# Patient Record
Sex: Male | Born: 1935 | Race: White | Hispanic: No | Smoking: Never smoker
Health system: Southern US, Community
[De-identification: ages and names within clinical notes are randomized; demographics above are authoritative.]

## PROBLEM LIST (undated history)

## (undated) DIAGNOSIS — E785 Hyperlipidemia, unspecified: Secondary | ICD-10-CM

## (undated) DIAGNOSIS — I639 Cerebral infarction, unspecified: Secondary | ICD-10-CM

## (undated) DIAGNOSIS — N189 Chronic kidney disease, unspecified: Secondary | ICD-10-CM

## (undated) DIAGNOSIS — N400 Enlarged prostate without lower urinary tract symptoms: Secondary | ICD-10-CM

## (undated) DIAGNOSIS — C20 Malignant neoplasm of rectum: Secondary | ICD-10-CM

## (undated) DIAGNOSIS — R569 Unspecified convulsions: Secondary | ICD-10-CM

## (undated) DIAGNOSIS — I77810 Thoracic aortic ectasia: Secondary | ICD-10-CM

## (undated) DIAGNOSIS — I2510 Atherosclerotic heart disease of native coronary artery without angina pectoris: Secondary | ICD-10-CM

## (undated) DIAGNOSIS — C189 Malignant neoplasm of colon, unspecified: Secondary | ICD-10-CM

## (undated) DIAGNOSIS — I10 Essential (primary) hypertension: Secondary | ICD-10-CM

## (undated) DIAGNOSIS — D649 Anemia, unspecified: Secondary | ICD-10-CM

## (undated) DIAGNOSIS — E119 Type 2 diabetes mellitus without complications: Secondary | ICD-10-CM

## (undated) DIAGNOSIS — D72819 Decreased white blood cell count, unspecified: Secondary | ICD-10-CM

## (undated) HISTORY — DX: Decreased white blood cell count, unspecified: D72.819

## (undated) HISTORY — PX: OTHER SURGICAL HISTORY: SHX169

## (undated) HISTORY — DX: Malignant neoplasm of colon, unspecified: C18.9

## (undated) HISTORY — DX: Anemia, unspecified: D64.9

## (undated) HISTORY — PX: TONSILLECTOMY: SUR1361

## (undated) HISTORY — DX: Hyperlipidemia, unspecified: E78.5

## (undated) HISTORY — PX: COLON SURGERY: SHX602

## (undated) HISTORY — DX: Benign prostatic hyperplasia without lower urinary tract symptoms: N40.0

## (undated) HISTORY — PX: VASECTOMY: SHX75

## (undated) HISTORY — PX: HIP ARTHROPLASTY: SHX981

## (undated) HISTORY — DX: Malignant neoplasm of rectum: C20

## (undated) HISTORY — PX: CATARACT EXTRACTION, BILATERAL: SHX1313

## (undated) HISTORY — DX: Unspecified convulsions: R56.9

## (undated) HISTORY — DX: Atherosclerotic heart disease of native coronary artery without angina pectoris: I25.10

## (undated) HISTORY — DX: Cerebral infarction, unspecified: I63.9

## (undated) HISTORY — PX: JOINT REPLACEMENT: SHX530

## (undated) HISTORY — DX: Type 2 diabetes mellitus without complications: E11.9

## (undated) HISTORY — DX: Essential (primary) hypertension: I10

## (undated) HISTORY — DX: Chronic kidney disease, unspecified: N18.9

## (undated) HISTORY — DX: Thoracic aortic ectasia: I77.810

---

## 1998-10-21 ENCOUNTER — Ambulatory Visit (HOSPITAL_COMMUNITY): Admission: RE | Admit: 1998-10-21 | Discharge: 1998-10-21 | Payer: Self-pay | Admitting: Urology

## 1999-02-03 ENCOUNTER — Encounter: Admission: RE | Admit: 1999-02-03 | Discharge: 1999-05-04 | Payer: Self-pay | Admitting: Emergency Medicine

## 2003-09-12 ENCOUNTER — Encounter: Payer: Self-pay | Admitting: Neurology

## 2003-09-12 ENCOUNTER — Inpatient Hospital Stay (HOSPITAL_COMMUNITY): Admission: EM | Admit: 2003-09-12 | Discharge: 2003-09-13 | Payer: Self-pay | Admitting: *Deleted

## 2003-09-12 ENCOUNTER — Encounter: Payer: Self-pay | Admitting: Emergency Medicine

## 2003-09-13 ENCOUNTER — Encounter: Payer: Self-pay | Admitting: Internal Medicine

## 2005-02-11 ENCOUNTER — Ambulatory Visit: Payer: Self-pay

## 2005-04-30 ENCOUNTER — Ambulatory Visit: Payer: Self-pay | Admitting: Cardiology

## 2005-09-06 ENCOUNTER — Ambulatory Visit: Payer: Self-pay | Admitting: Cardiology

## 2005-09-07 ENCOUNTER — Ambulatory Visit: Payer: Self-pay | Admitting: Cardiology

## 2005-11-08 ENCOUNTER — Ambulatory Visit: Payer: Self-pay | Admitting: Cardiology

## 2006-02-21 ENCOUNTER — Ambulatory Visit: Payer: Self-pay | Admitting: Cardiology

## 2006-02-22 ENCOUNTER — Ambulatory Visit: Payer: Self-pay | Admitting: Cardiology

## 2006-06-07 ENCOUNTER — Encounter: Payer: Self-pay | Admitting: Cardiology

## 2006-08-16 ENCOUNTER — Ambulatory Visit: Payer: Self-pay | Admitting: Cardiology

## 2007-02-14 ENCOUNTER — Ambulatory Visit: Payer: Self-pay | Admitting: Cardiology

## 2007-05-18 ENCOUNTER — Ambulatory Visit: Payer: Self-pay | Admitting: Cardiology

## 2007-05-29 ENCOUNTER — Inpatient Hospital Stay (HOSPITAL_COMMUNITY): Admission: RE | Admit: 2007-05-29 | Discharge: 2007-06-02 | Payer: Self-pay | Admitting: Orthopedic Surgery

## 2007-11-15 ENCOUNTER — Ambulatory Visit: Payer: Self-pay | Admitting: Cardiology

## 2008-05-16 ENCOUNTER — Ambulatory Visit: Payer: Self-pay | Admitting: Cardiology

## 2008-11-23 ENCOUNTER — Emergency Department (HOSPITAL_COMMUNITY): Admission: EM | Admit: 2008-11-23 | Discharge: 2008-11-23 | Payer: Self-pay | Admitting: Emergency Medicine

## 2008-12-17 ENCOUNTER — Ambulatory Visit: Payer: Self-pay | Admitting: Cardiology

## 2009-02-19 ENCOUNTER — Ambulatory Visit: Payer: Self-pay | Admitting: Cardiology

## 2009-02-20 ENCOUNTER — Encounter: Payer: Self-pay | Admitting: Cardiology

## 2009-02-20 ENCOUNTER — Ambulatory Visit: Payer: Self-pay | Admitting: Family Medicine

## 2009-02-20 ENCOUNTER — Ambulatory Visit: Payer: Self-pay | Admitting: Vascular Surgery

## 2009-02-20 ENCOUNTER — Encounter (INDEPENDENT_AMBULATORY_CARE_PROVIDER_SITE_OTHER): Payer: Self-pay | Admitting: Family Medicine

## 2009-02-20 ENCOUNTER — Inpatient Hospital Stay (HOSPITAL_COMMUNITY): Admission: EM | Admit: 2009-02-20 | Discharge: 2009-02-20 | Payer: Self-pay | Admitting: Emergency Medicine

## 2009-02-20 ENCOUNTER — Encounter: Payer: Self-pay | Admitting: Family Medicine

## 2009-03-25 ENCOUNTER — Encounter: Payer: Self-pay | Admitting: Cardiology

## 2009-03-28 ENCOUNTER — Ambulatory Visit: Payer: Self-pay | Admitting: Oncology

## 2009-04-01 LAB — COMPREHENSIVE METABOLIC PANEL
CO2: 33 mEq/L — ABNORMAL HIGH (ref 19–32)
Creatinine, Ser: 1 mg/dL (ref 0.40–1.50)
Glucose, Bld: 172 mg/dL — ABNORMAL HIGH (ref 70–99)
Sodium: 137 mEq/L (ref 135–145)
Total Bilirubin: 0.7 mg/dL (ref 0.3–1.2)
Total Protein: 7.1 g/dL (ref 6.0–8.3)

## 2009-04-01 LAB — CBC & DIFF AND RETIC
BASO%: 1.6 % (ref 0.0–2.0)
Basophils Absolute: 0 10*3/uL (ref 0.0–0.1)
EOS%: 4.1 % (ref 0.0–7.0)
Eosinophils Absolute: 0.1 10*3/uL (ref 0.0–0.5)
HCT: 36.4 % — ABNORMAL LOW (ref 38.4–49.9)
HGB: 12.7 g/dL — ABNORMAL LOW (ref 13.0–17.1)
LYMPH%: 59.6 % — ABNORMAL HIGH (ref 14.0–49.0)
MONO#: 0.5 10*3/uL (ref 0.1–0.9)
MONO%: 26.4 % — ABNORMAL HIGH (ref 0.0–14.0)
NEUT#: 0.2 10*3/uL — CL (ref 1.5–6.5)
NEUT%: 8.3 % — ABNORMAL LOW (ref 39.0–75.0)
RDW: 13.3 % (ref 11.0–14.6)
WBC: 1.9 10*3/uL — ABNORMAL LOW (ref 4.0–10.3)
lymph#: 1.2 10*3/uL (ref 0.9–3.3)
nRBC: 0 % (ref 0–0)

## 2009-04-01 LAB — MORPHOLOGY: PLT EST: ADEQUATE

## 2009-04-01 LAB — LACTATE DEHYDROGENASE: LDH: 127 U/L (ref 94–250)

## 2009-04-01 LAB — CHCC SMEAR

## 2009-04-03 ENCOUNTER — Encounter (HOSPITAL_COMMUNITY): Payer: Self-pay | Admitting: Oncology

## 2009-04-03 ENCOUNTER — Ambulatory Visit (HOSPITAL_COMMUNITY): Admission: RE | Admit: 2009-04-03 | Discharge: 2009-04-03 | Payer: Self-pay | Admitting: Oncology

## 2009-04-03 ENCOUNTER — Ambulatory Visit: Payer: Self-pay | Admitting: Oncology

## 2009-04-09 LAB — CBC WITH DIFFERENTIAL/PLATELET
Eosinophils Absolute: 0.1 10*3/uL (ref 0.0–0.5)
HCT: 35.9 % — ABNORMAL LOW (ref 38.4–49.9)
LYMPH%: 61.4 % — ABNORMAL HIGH (ref 14.0–49.0)
MONO#: 0.6 10*3/uL (ref 0.1–0.9)
NEUT#: 0.2 10*3/uL — CL (ref 1.5–6.5)
NEUT%: 8.9 % — ABNORMAL LOW (ref 39.0–75.0)
Platelets: 196 10*3/uL (ref 140–400)
WBC: 2.4 10*3/uL — ABNORMAL LOW (ref 4.0–10.3)

## 2009-04-10 ENCOUNTER — Other Ambulatory Visit: Payer: Self-pay | Admitting: Emergency Medicine

## 2009-04-11 ENCOUNTER — Ambulatory Visit: Payer: Self-pay | Admitting: Family Medicine

## 2009-04-11 ENCOUNTER — Inpatient Hospital Stay (HOSPITAL_COMMUNITY): Admission: EM | Admit: 2009-04-11 | Discharge: 2009-04-11 | Payer: Self-pay | Admitting: Family Medicine

## 2009-04-11 ENCOUNTER — Encounter: Payer: Self-pay | Admitting: Cardiology

## 2009-04-25 LAB — RHEUMATOID FACTOR: Rhuematoid fact SerPl-aCnc: <20 IU/mL (ref 0–20)

## 2009-04-25 LAB — CBC WITH DIFFERENTIAL/PLATELET
Eosinophils Absolute: 0.2 10*3/uL (ref 0.0–0.5)
HGB: 12.9 g/dL — ABNORMAL LOW (ref 13.0–17.1)
MCV: 83.6 fL (ref 79.3–98.0)
MONO#: 0.4 10*3/uL (ref 0.1–0.9)
MONO%: 20.2 % — ABNORMAL HIGH (ref 0.0–14.0)
NEUT#: 0.2 10*3/uL — CL (ref 1.5–6.5)
RBC: 4.45 10*6/uL (ref 4.20–5.82)
RDW: 13.3 % (ref 11.0–14.6)
WBC: 2.1 10*3/uL — ABNORMAL LOW (ref 4.0–10.3)
lymph#: 1.3 10*3/uL (ref 0.9–3.3)
nRBC: 0 % (ref 0–0)

## 2009-04-29 LAB — ANCA SCREEN W REFLEX TITER: Anti-Neutrophil Ab: POSITIVE — AB

## 2009-04-29 LAB — ANTI-NEUTROPHILIC CYTOPLASMIC ANTIBODY PANEL: ANCA Screen: NEGATIVE

## 2009-05-02 LAB — CBC WITH DIFFERENTIAL/PLATELET
Basophils Absolute: 0.1 10*3/uL (ref 0.0–0.1)
Eosinophils Absolute: 0.2 10*3/uL (ref 0.0–0.5)
HCT: 35.7 % — ABNORMAL LOW (ref 38.4–49.9)
LYMPH%: 65.3 % — ABNORMAL HIGH (ref 14.0–49.0)
MCV: 83.4 fL (ref 79.3–98.0)
MONO%: 24 % — ABNORMAL HIGH (ref 0.0–14.0)
NEUT#: 0.1 10*3/uL — CL (ref 1.5–6.5)
NEUT%: 2 % — ABNORMAL LOW (ref 39.0–75.0)
Platelets: 181 10*3/uL (ref 140–400)
RBC: 4.28 10*6/uL (ref 4.20–5.82)

## 2009-05-21 ENCOUNTER — Ambulatory Visit: Payer: Self-pay | Admitting: Oncology

## 2009-05-23 LAB — CBC WITH DIFFERENTIAL/PLATELET
Eosinophils Absolute: 0.1 10*3/uL (ref 0.0–0.5)
HCT: 35.6 % — ABNORMAL LOW (ref 38.4–49.9)
LYMPH%: 65.4 % — ABNORMAL HIGH (ref 14.0–49.0)
MCV: 82.2 fL (ref 79.3–98.0)
MONO#: 0.6 10*3/uL (ref 0.1–0.9)
MONO%: 26.1 % — ABNORMAL HIGH (ref 0.0–14.0)
NEUT#: 0.1 10*3/uL — CL (ref 1.5–6.5)
NEUT%: 2.5 % — ABNORMAL LOW (ref 39.0–75.0)
Platelets: 196 10*3/uL (ref 140–400)
WBC: 2.3 10*3/uL — ABNORMAL LOW (ref 4.0–10.3)

## 2009-06-06 LAB — CBC WITH DIFFERENTIAL/PLATELET
BASO%: 1.7 % (ref 0.0–2.0)
Basophils Absolute: 0 10e3/uL (ref 0.0–0.1)
EOS%: 4.3 % (ref 0.0–7.0)
Eosinophils Absolute: 0.1 10e3/uL (ref 0.0–0.5)
HCT: 37.5 % — ABNORMAL LOW (ref 38.4–49.9)
HGB: 13 g/dL (ref 13.0–17.1)
LYMPH%: 59.8 % — ABNORMAL HIGH (ref 14.0–49.0)
MCH: 28.8 pg (ref 27.2–33.4)
MCHC: 34.7 g/dL (ref 32.0–36.0)
MCV: 83.1 fL (ref 79.3–98.0)
MONO#: 0.7 10e3/uL (ref 0.1–0.9)
MONO%: 31.2 % — ABNORMAL HIGH (ref 0.0–14.0)
NEUT#: 0.1 10e3/uL — CL (ref 1.5–6.5)
NEUT%: 3 % — ABNORMAL LOW (ref 39.0–75.0)
Platelets: 167 10e3/uL (ref 140–400)
RBC: 4.51 10e6/uL (ref 4.20–5.82)
RDW: 13.2 % (ref 11.0–14.6)
WBC: 2.3 10e3/uL — ABNORMAL LOW (ref 4.0–10.3)
lymph#: 1.4 10e3/uL (ref 0.9–3.3)

## 2009-06-06 LAB — COMPREHENSIVE METABOLIC PANEL
AST: 14 U/L (ref 0–37)
BUN: 16 mg/dL (ref 6–23)
Calcium: 9.3 mg/dL (ref 8.4–10.5)
Chloride: 96 mEq/L (ref 96–112)
Creatinine, Ser: 0.98 mg/dL (ref 0.40–1.50)
Total Bilirubin: 0.4 mg/dL (ref 0.3–1.2)

## 2009-06-06 LAB — LACTATE DEHYDROGENASE: LDH: 112 U/L (ref 94–250)

## 2009-06-20 ENCOUNTER — Ambulatory Visit: Payer: Self-pay | Admitting: Oncology

## 2009-06-20 LAB — CBC WITH DIFFERENTIAL/PLATELET
BASO%: 2.2 % — ABNORMAL HIGH (ref 0.0–2.0)
Basophils Absolute: 0 10*3/uL (ref 0.0–0.1)
EOS%: 4.5 % (ref 0.0–7.0)
MCH: 28.8 pg (ref 27.2–33.4)
MCHC: 34.9 g/dL (ref 32.0–36.0)
MCV: 82.5 fL (ref 79.3–98.0)
MONO%: 31.3 % — ABNORMAL HIGH (ref 0.0–14.0)
RDW: 13 % (ref 11.0–14.6)
lymph#: 0.8 10*3/uL — ABNORMAL LOW (ref 0.9–3.3)

## 2009-06-20 LAB — COMPREHENSIVE METABOLIC PANEL
AST: 16 U/L (ref 0–37)
Albumin: 4.1 g/dL (ref 3.5–5.2)
Alkaline Phosphatase: 66 U/L (ref 39–117)
BUN: 18 mg/dL (ref 6–23)
Glucose, Bld: 154 mg/dL — ABNORMAL HIGH (ref 70–99)
Potassium: 4.4 mEq/L (ref 3.5–5.3)
Sodium: 136 mEq/L (ref 135–145)
Total Bilirubin: 0.4 mg/dL (ref 0.3–1.2)
Total Protein: 6.9 g/dL (ref 6.0–8.3)

## 2009-07-18 ENCOUNTER — Ambulatory Visit: Payer: Self-pay | Admitting: Cardiology

## 2009-07-18 LAB — CBC WITH DIFFERENTIAL/PLATELET
Basophils Absolute: 0 10*3/uL (ref 0.0–0.1)
Eosinophils Absolute: 0.1 10*3/uL (ref 0.0–0.5)
HGB: 12.4 g/dL — ABNORMAL LOW (ref 13.0–17.1)
LYMPH%: 56.1 % — ABNORMAL HIGH (ref 14.0–49.0)
MCV: 82.1 fL (ref 79.3–98.0)
MONO#: 0.6 10*3/uL (ref 0.1–0.9)
MONO%: 32.9 % — ABNORMAL HIGH (ref 0.0–14.0)
NEUT#: 0.1 10*3/uL — CL (ref 1.5–6.5)
Platelets: 198 10*3/uL (ref 140–400)
WBC: 1.7 10*3/uL — ABNORMAL LOW (ref 4.0–10.3)
nRBC: 0 % (ref 0–0)

## 2009-08-20 ENCOUNTER — Ambulatory Visit: Payer: Self-pay | Admitting: Oncology

## 2009-08-22 LAB — CBC WITH DIFFERENTIAL/PLATELET
BASO%: 1.6 % (ref 0.0–2.0)
Basophils Absolute: 0 10*3/uL (ref 0.0–0.1)
EOS%: 5.1 % (ref 0.0–7.0)
Eosinophils Absolute: 0.1 10*3/uL (ref 0.0–0.5)
HCT: 33.2 % — ABNORMAL LOW (ref 38.4–49.9)
HGB: 11.7 g/dL — ABNORMAL LOW (ref 13.0–17.1)
LYMPH%: 60.3 % — ABNORMAL HIGH (ref 14.0–49.0)
MCH: 29.2 pg (ref 27.2–33.4)
MCHC: 35.2 g/dL (ref 32.0–36.0)
MCV: 82.9 fL (ref 79.3–98.0)
MONO#: 0.6 10*3/uL (ref 0.1–0.9)
MONO%: 31.7 % — ABNORMAL HIGH (ref 0.0–14.0)
NEUT#: 0 10*3/uL — CL (ref 1.5–6.5)
NEUT%: 1.3 % — ABNORMAL LOW (ref 39.0–75.0)
Platelets: 236 10*3/uL (ref 140–400)
RBC: 4 10*6/uL — ABNORMAL LOW (ref 4.20–5.82)
RDW: 13.5 % (ref 11.0–14.6)
WBC: 1.9 10*3/uL — ABNORMAL LOW (ref 4.0–10.3)
lymph#: 1.1 10*3/uL (ref 0.9–3.3)

## 2009-09-17 ENCOUNTER — Ambulatory Visit: Payer: Self-pay | Admitting: Oncology

## 2009-09-19 LAB — CBC WITH DIFFERENTIAL/PLATELET
BASO%: 2.8 % — ABNORMAL HIGH (ref 0.0–2.0)
Basophils Absolute: 0 10*3/uL (ref 0.0–0.1)
EOS%: 4.7 % (ref 0.0–7.0)
HGB: 11.7 g/dL — ABNORMAL LOW (ref 13.0–17.1)
MCH: 28.9 pg (ref 27.2–33.4)
MCHC: 34.7 g/dL (ref 32.0–36.0)
RBC: 4.04 10*6/uL — ABNORMAL LOW (ref 4.20–5.82)
RDW: 13.6 % (ref 11.0–14.6)
lymph#: 0.7 10*3/uL — ABNORMAL LOW (ref 0.9–3.3)

## 2009-09-20 DIAGNOSIS — E785 Hyperlipidemia, unspecified: Secondary | ICD-10-CM

## 2009-09-20 DIAGNOSIS — I10 Essential (primary) hypertension: Secondary | ICD-10-CM

## 2009-09-20 DIAGNOSIS — I2510 Atherosclerotic heart disease of native coronary artery without angina pectoris: Secondary | ICD-10-CM | POA: Insufficient documentation

## 2009-10-24 ENCOUNTER — Ambulatory Visit: Payer: Self-pay | Admitting: Oncology

## 2009-10-28 LAB — CBC WITH DIFFERENTIAL/PLATELET
Basophils Absolute: 0 10*3/uL (ref 0.0–0.1)
Eosinophils Absolute: 0.1 10*3/uL (ref 0.0–0.5)
HCT: 35.7 % — ABNORMAL LOW (ref 38.4–49.9)
LYMPH%: 47 % (ref 14.0–49.0)
MONO#: 0.5 10*3/uL (ref 0.1–0.9)
NEUT#: 0.1 10*3/uL — CL (ref 1.5–6.5)
NEUT%: 9.7 % — ABNORMAL LOW (ref 39.0–75.0)
Platelets: 252 10*3/uL (ref 140–400)
WBC: 1.2 10*3/uL — ABNORMAL LOW (ref 4.0–10.3)

## 2009-10-28 LAB — SEDIMENTATION RATE: Sed Rate: 1 mm/hr (ref 0–16)

## 2009-10-28 LAB — VITAMIN B12: Vitamin B-12: 883 pg/mL (ref 211–911)

## 2009-10-28 LAB — COMPREHENSIVE METABOLIC PANEL
CO2: 28 mEq/L (ref 19–32)
Creatinine, Ser: 0.83 mg/dL (ref 0.40–1.50)
Glucose, Bld: 222 mg/dL — ABNORMAL HIGH (ref 70–99)
Total Bilirubin: 0.4 mg/dL (ref 0.3–1.2)

## 2009-10-28 LAB — LACTATE DEHYDROGENASE: LDH: 118 U/L (ref 94–250)

## 2009-11-25 ENCOUNTER — Ambulatory Visit: Payer: Self-pay | Admitting: Oncology

## 2009-11-25 LAB — CBC WITH DIFFERENTIAL/PLATELET
Basophils Absolute: 0.1 10*3/uL (ref 0.0–0.1)
Eosinophils Absolute: 0.1 10*3/uL (ref 0.0–0.5)
HGB: 11.7 g/dL — ABNORMAL LOW (ref 13.0–17.1)
MCV: 83.6 fL (ref 79.3–98.0)
MONO%: 26.8 % — ABNORMAL HIGH (ref 0.0–14.0)
NEUT#: 0 10*3/uL — CL (ref 1.5–6.5)
RDW: 13.4 % (ref 11.0–14.6)
lymph#: 1.2 10*3/uL (ref 0.9–3.3)

## 2009-12-19 ENCOUNTER — Ambulatory Visit: Payer: Self-pay | Admitting: Oncology

## 2009-12-23 LAB — CBC WITH DIFFERENTIAL/PLATELET
BASO%: 2.7 % — ABNORMAL HIGH (ref 0.0–2.0)
HCT: 35 % — ABNORMAL LOW (ref 38.4–49.9)
HGB: 11.7 g/dL — ABNORMAL LOW (ref 13.0–17.1)
MCHC: 33.4 g/dL (ref 32.0–36.0)
MONO#: 0.5 10*3/uL (ref 0.1–0.9)
NEUT%: 4.4 % — ABNORMAL LOW (ref 39.0–75.0)
WBC: 1.8 10*3/uL — ABNORMAL LOW (ref 4.0–10.3)
lymph#: 1.1 10*3/uL (ref 0.9–3.3)

## 2010-01-16 ENCOUNTER — Encounter (INDEPENDENT_AMBULATORY_CARE_PROVIDER_SITE_OTHER): Payer: Self-pay | Admitting: *Deleted

## 2010-01-16 ENCOUNTER — Encounter: Payer: Self-pay | Admitting: Cardiology

## 2010-01-16 ENCOUNTER — Inpatient Hospital Stay (HOSPITAL_COMMUNITY): Admission: EM | Admit: 2010-01-16 | Discharge: 2010-01-19 | Payer: Self-pay | Admitting: Family Medicine

## 2010-01-16 ENCOUNTER — Ambulatory Visit: Payer: Self-pay | Admitting: Family Medicine

## 2010-01-16 ENCOUNTER — Encounter: Payer: Self-pay | Admitting: Emergency Medicine

## 2010-01-16 ENCOUNTER — Ambulatory Visit: Payer: Self-pay | Admitting: Diagnostic Radiology

## 2010-01-19 ENCOUNTER — Encounter: Payer: Self-pay | Admitting: Cardiology

## 2010-01-19 ENCOUNTER — Telehealth (INDEPENDENT_AMBULATORY_CARE_PROVIDER_SITE_OTHER): Payer: Self-pay | Admitting: *Deleted

## 2010-01-20 ENCOUNTER — Ambulatory Visit: Payer: Self-pay | Admitting: Oncology

## 2010-01-20 LAB — CBC WITH DIFFERENTIAL/PLATELET
Basophils Absolute: 0 10*3/uL (ref 0.0–0.1)
Eosinophils Absolute: 0.1 10*3/uL (ref 0.0–0.5)
HCT: 33.8 % — ABNORMAL LOW (ref 38.4–49.9)
HGB: 11.2 g/dL — ABNORMAL LOW (ref 13.0–17.1)
MCH: 27.1 pg — ABNORMAL LOW (ref 27.2–33.4)
MONO#: 0.8 10*3/uL (ref 0.1–0.9)
NEUT#: 0 10*3/uL — CL (ref 1.5–6.5)
NEUT%: 0.5 % — ABNORMAL LOW (ref 39.0–75.0)
RDW: 13.1 % (ref 11.0–14.6)
lymph#: 1 10*3/uL (ref 0.9–3.3)

## 2010-02-17 LAB — COMPREHENSIVE METABOLIC PANEL
AST: 12 U/L (ref 0–37)
BUN: 15 mg/dL (ref 6–23)
Calcium: 9.4 mg/dL (ref 8.4–10.5)
Chloride: 96 mEq/L (ref 96–112)
Creatinine, Ser: 0.92 mg/dL (ref 0.40–1.50)
Glucose, Bld: 188 mg/dL — ABNORMAL HIGH (ref 70–99)

## 2010-02-17 LAB — VITAMIN B12: Vitamin B-12: 647 pg/mL (ref 211–911)

## 2010-02-17 LAB — CBC WITH DIFFERENTIAL/PLATELET
Basophils Absolute: 0 10*3/uL (ref 0.0–0.1)
EOS%: 4.4 % (ref 0.0–7.0)
HCT: 33.7 % — ABNORMAL LOW (ref 38.4–49.9)
HGB: 11.6 g/dL — ABNORMAL LOW (ref 13.0–17.1)
MCH: 28.3 pg (ref 27.2–33.4)
MCV: 81.8 fL (ref 79.3–98.0)
MONO%: 32.6 % — ABNORMAL HIGH (ref 0.0–14.0)
NEUT%: 8.3 % — ABNORMAL LOW (ref 39.0–75.0)
lymph#: 0.6 10*3/uL — ABNORMAL LOW (ref 0.9–3.3)

## 2010-02-18 LAB — IRON AND TIBC
Iron: 18 ug/dL — ABNORMAL LOW (ref 42–165)
UIBC: 285 ug/dL

## 2010-02-18 LAB — FERRITIN: Ferritin: 92 ng/mL (ref 22–322)

## 2010-02-23 ENCOUNTER — Ambulatory Visit: Payer: Self-pay | Admitting: Oncology

## 2010-02-23 LAB — FECAL OCCULT BLOOD, GUAIAC: Occult Blood: NEGATIVE

## 2010-03-16 ENCOUNTER — Ambulatory Visit: Payer: Self-pay | Admitting: Cardiology

## 2010-03-17 LAB — MANUAL DIFFERENTIAL
ANC (CHCC manual diff): 0 10*3/uL — CL (ref 1.5–6.5)
Band Neutrophils: 0 % (ref 0–10)
Basophil: 3 % — ABNORMAL HIGH (ref 0–2)
Blasts: 0 % (ref 0–0)
EOS: 4 % (ref 0–7)
LYMPH: 78 % — ABNORMAL HIGH (ref 14–49)
Metamyelocytes: 0 % (ref 0–0)
PLT EST: ADEQUATE
nRBC: 0 % (ref 0–0)

## 2010-03-17 LAB — CBC WITH DIFFERENTIAL/PLATELET
HCT: 29.6 % — ABNORMAL LOW (ref 38.4–49.9)
MCH: 27 pg — ABNORMAL LOW (ref 27.2–33.4)
MCHC: 33.4 g/dL (ref 32.0–36.0)
MCV: 80.7 fL (ref 79.3–98.0)
RDW: 13.5 % (ref 11.0–14.6)

## 2010-03-26 ENCOUNTER — Ambulatory Visit: Payer: Self-pay | Admitting: Oncology

## 2010-03-30 LAB — CBC WITH DIFFERENTIAL/PLATELET
BASO%: 3.3 % — ABNORMAL HIGH (ref 0.0–2.0)
Eosinophils Absolute: 0.1 10*3/uL (ref 0.0–0.5)
MCHC: 32.9 g/dL (ref 32.0–36.0)
MONO#: 0.6 10*3/uL (ref 0.1–0.9)
NEUT#: 0 10*3/uL — CL (ref 1.5–6.5)
Platelets: 277 10*3/uL (ref 140–400)
RBC: 3.51 10*6/uL — ABNORMAL LOW (ref 4.20–5.82)
RDW: 13.1 % (ref 11.0–14.6)
WBC: 1.8 10*3/uL — ABNORMAL LOW (ref 4.0–10.3)
lymph#: 1.1 10*3/uL (ref 0.9–3.3)
nRBC: 0 % (ref 0–0)

## 2010-03-31 LAB — IRON AND TIBC
%SAT: 5 % — ABNORMAL LOW (ref 20–55)
Iron: 14 ug/dL — ABNORMAL LOW (ref 42–165)
TIBC: 310 ug/dL (ref 215–435)
UIBC: 296 ug/dL

## 2010-04-06 ENCOUNTER — Ambulatory Visit: Payer: Self-pay | Admitting: Oncology

## 2010-04-06 LAB — COMPREHENSIVE METABOLIC PANEL
Albumin: 3.9 g/dL (ref 3.5–5.2)
BUN: 25 mg/dL — ABNORMAL HIGH (ref 6–23)
Calcium: 9.4 mg/dL (ref 8.4–10.5)
Chloride: 94 mEq/L — ABNORMAL LOW (ref 96–112)
Glucose, Bld: 123 mg/dL — ABNORMAL HIGH (ref 70–99)
Potassium: 4.5 mEq/L (ref 3.5–5.3)

## 2010-04-06 LAB — CBC WITH DIFFERENTIAL/PLATELET
BASO%: 2.2 % — ABNORMAL HIGH (ref 0.0–2.0)
HCT: 29.5 % — ABNORMAL LOW (ref 38.4–49.9)
LYMPH%: 57.3 % — ABNORMAL HIGH (ref 14.0–49.0)
MCH: 25.9 pg — ABNORMAL LOW (ref 27.2–33.4)
MCHC: 32.5 g/dL (ref 32.0–36.0)
MCV: 79.5 fL (ref 79.3–98.0)
MONO#: 0.8 10*3/uL (ref 0.1–0.9)
MONO%: 32.3 % — ABNORMAL HIGH (ref 0.0–14.0)
NEUT%: 4.8 % — ABNORMAL LOW (ref 39.0–75.0)
Platelets: 337 10*3/uL (ref 140–400)
RBC: 3.71 10*6/uL — ABNORMAL LOW (ref 4.20–5.82)
WBC: 2.3 10*3/uL — ABNORMAL LOW (ref 4.0–10.3)

## 2010-04-06 LAB — MORPHOLOGY

## 2010-04-08 ENCOUNTER — Ambulatory Visit (HOSPITAL_COMMUNITY): Admission: RE | Admit: 2010-04-08 | Discharge: 2010-04-08 | Payer: Self-pay | Admitting: Oncology

## 2010-04-13 LAB — CBC WITH DIFFERENTIAL/PLATELET
Eosinophils Absolute: 0.1 10*3/uL (ref 0.0–0.5)
HCT: 25.8 % — ABNORMAL LOW (ref 38.4–49.9)
HGB: 8.6 g/dL — ABNORMAL LOW (ref 13.0–17.1)
MCV: 80.3 fL (ref 79.3–98.0)
MONO#: 0.4 10*3/uL (ref 0.1–0.9)
MONO%: 32.7 % — ABNORMAL HIGH (ref 0.0–14.0)
Platelets: 315 10*3/uL (ref 140–400)
RBC: 3.21 10*6/uL — ABNORMAL LOW (ref 4.20–5.82)
RDW: 13.3 % (ref 11.0–14.6)

## 2010-04-15 ENCOUNTER — Ambulatory Visit (HOSPITAL_COMMUNITY): Admission: RE | Admit: 2010-04-15 | Discharge: 2010-04-15 | Payer: Self-pay | Admitting: Oncology

## 2010-04-20 LAB — CBC WITH DIFFERENTIAL/PLATELET
BASO%: 1.5 % (ref 0.0–2.0)
HCT: 25.7 % — ABNORMAL LOW (ref 38.4–49.9)
HGB: 8.6 g/dL — ABNORMAL LOW (ref 13.0–17.1)
MCHC: 33.6 g/dL (ref 32.0–36.0)
MONO#: 0.7 10*3/uL (ref 0.1–0.9)
NEUT%: 2.2 % — ABNORMAL LOW (ref 39.0–75.0)
RDW: 14.1 % (ref 11.0–14.6)
WBC: 1.9 10*3/uL — ABNORMAL LOW (ref 4.0–10.3)
lymph#: 1 10*3/uL (ref 0.9–3.3)

## 2010-04-27 ENCOUNTER — Ambulatory Visit: Payer: Self-pay | Admitting: Oncology

## 2010-04-28 LAB — CBC WITH DIFFERENTIAL/PLATELET
Basophils Absolute: 0 10*3/uL (ref 0.0–0.1)
Eosinophils Absolute: 0.1 10*3/uL (ref 0.0–0.5)
HCT: 26.6 % — ABNORMAL LOW (ref 38.4–49.9)
HGB: 9.2 g/dL — ABNORMAL LOW (ref 13.0–17.1)
MCH: 28.4 pg (ref 27.2–33.4)
MONO#: 0.4 10*3/uL (ref 0.1–0.9)
NEUT#: 0.1 10*3/uL — CL (ref 1.5–6.5)
NEUT%: 9.8 % — ABNORMAL LOW (ref 39.0–75.0)
WBC: 1.3 10*3/uL — ABNORMAL LOW (ref 4.0–10.3)
lymph#: 0.6 10*3/uL — ABNORMAL LOW (ref 0.9–3.3)

## 2010-04-28 LAB — FLOW CYTOMETRY

## 2010-04-29 LAB — TRANSFERRIN RECEPTOR, SOLUABLE: Transferrin Receptor, Soluble: 21.8 nmol/L

## 2010-04-29 LAB — C-REACTIVE PROTEIN: CRP: 2.8 mg/dL — ABNORMAL HIGH (ref <0.6)

## 2010-04-29 LAB — IRON AND TIBC
Iron: 20 ug/dL — ABNORMAL LOW (ref 42–165)
TIBC: 293 ug/dL (ref 215–435)
UIBC: 273 ug/dL

## 2010-05-19 LAB — CBC WITH DIFFERENTIAL/PLATELET
BASO%: 3.3 % — ABNORMAL HIGH (ref 0.0–2.0)
EOS%: 5.3 % (ref 0.0–7.0)
HCT: 25.6 % — ABNORMAL LOW (ref 38.4–49.9)
LYMPH%: 48 % (ref 14.0–49.0)
MCH: 26.3 pg — ABNORMAL LOW (ref 27.2–33.4)
MCHC: 32 g/dL (ref 32.0–36.0)
NEUT%: 8.5 % — ABNORMAL LOW (ref 39.0–75.0)
Platelets: 283 10*3/uL (ref 140–400)

## 2010-05-27 ENCOUNTER — Ambulatory Visit: Payer: Self-pay | Admitting: Oncology

## 2010-05-27 LAB — CBC WITH DIFFERENTIAL/PLATELET
Basophils Absolute: 0 10*3/uL (ref 0.0–0.1)
EOS%: 0.8 % (ref 0.0–7.0)
HGB: 7.6 g/dL — ABNORMAL LOW (ref 13.0–17.1)
LYMPH%: 53.9 % — ABNORMAL HIGH (ref 14.0–49.0)
MCH: 25.7 pg — ABNORMAL LOW (ref 27.2–33.4)
MCV: 81.8 fL (ref 79.3–98.0)
MONO%: 31.3 % — ABNORMAL HIGH (ref 0.0–14.0)
Platelets: 308 10*3/uL (ref 140–400)
RBC: 2.96 10*6/uL — ABNORMAL LOW (ref 4.20–5.82)
RDW: 14.5 % (ref 11.0–14.6)

## 2010-05-28 LAB — HAPTOGLOBIN: Haptoglobin: 222 mg/dL — ABNORMAL HIGH (ref 16–200)

## 2010-05-28 LAB — DIRECT ANTIGLOBULIN TEST (NOT AT ARMC): DAT (Complement): NEGATIVE

## 2010-06-03 LAB — CBC WITH DIFFERENTIAL/PLATELET
BASO%: 2.1 % — ABNORMAL HIGH (ref 0.0–2.0)
EOS%: 0.9 % (ref 0.0–7.0)
HCT: 23.5 % — ABNORMAL LOW (ref 38.4–49.9)
MCH: 26.8 pg — ABNORMAL LOW (ref 27.2–33.4)
MCHC: 33.9 g/dL (ref 32.0–36.0)
MCV: 79.2 fL — ABNORMAL LOW (ref 79.3–98.0)
MONO%: 25 % — ABNORMAL HIGH (ref 0.0–14.0)
NEUT%: 26.6 % — ABNORMAL LOW (ref 39.0–75.0)
RDW: 15.3 % — ABNORMAL HIGH (ref 11.0–14.6)
lymph#: 0.6 10*3/uL — ABNORMAL LOW (ref 0.9–3.3)

## 2010-06-10 LAB — CBC WITH DIFFERENTIAL/PLATELET
BASO%: 3.7 % — ABNORMAL HIGH (ref 0.0–2.0)
EOS%: 1.2 % (ref 0.0–7.0)
Eosinophils Absolute: 0 10*3/uL (ref 0.0–0.5)
LYMPH%: 59.3 % — ABNORMAL HIGH (ref 14.0–49.0)
MCHC: 31.3 g/dL — ABNORMAL LOW (ref 32.0–36.0)
MCV: 77.3 fL — ABNORMAL LOW (ref 79.3–98.0)
MONO%: 34.6 % — ABNORMAL HIGH (ref 0.0–14.0)
NEUT#: 0 10*3/uL — CL (ref 1.5–6.5)
Platelets: 310 10*3/uL (ref 140–400)
RBC: 3.26 10*6/uL — ABNORMAL LOW (ref 4.20–5.82)
RDW: 13.5 % (ref 11.0–14.6)
nRBC: 0 % (ref 0–0)

## 2010-06-16 LAB — PNH PROFILE (-HIGH SENSITIVITY)

## 2010-06-17 LAB — CBC WITH DIFFERENTIAL/PLATELET
Eosinophils Absolute: 0 10*3/uL (ref 0.0–0.5)
LYMPH%: 43.7 % (ref 14.0–49.0)
MCHC: 33.3 g/dL (ref 32.0–36.0)
MCV: 74.3 fL — ABNORMAL LOW (ref 79.3–98.0)
MONO%: 42.8 % — ABNORMAL HIGH (ref 0.0–14.0)
NEUT%: 11.2 % — ABNORMAL LOW (ref 39.0–75.0)
Platelets: 354 10*3/uL (ref 140–400)
RBC: 3.2 10*6/uL — ABNORMAL LOW (ref 4.20–5.82)

## 2010-06-24 LAB — CBC WITH DIFFERENTIAL/PLATELET
BASO%: 1.3 % (ref 0.0–2.0)
EOS%: 2.5 % (ref 0.0–7.0)
LYMPH%: 66.3 % — ABNORMAL HIGH (ref 14.0–49.0)
MCH: 22.4 pg — ABNORMAL LOW (ref 27.2–33.4)
MCHC: 30.1 g/dL — ABNORMAL LOW (ref 32.0–36.0)
MONO#: 0.1 10*3/uL (ref 0.1–0.9)
MONO%: 17.5 % — ABNORMAL HIGH (ref 0.0–14.0)
Platelets: 260 10*3/uL (ref 140–400)
RBC: 3.21 10*6/uL — ABNORMAL LOW (ref 4.20–5.82)
WBC: 0.8 10*3/uL — CL (ref 4.0–10.3)

## 2010-06-25 ENCOUNTER — Encounter (HOSPITAL_COMMUNITY): Admission: RE | Admit: 2010-06-25 | Discharge: 2010-06-25 | Payer: Self-pay | Admitting: Oncology

## 2010-06-25 LAB — TYPE & CROSSMATCH - CHCC

## 2010-06-25 LAB — VITAMIN B12: Vitamin B-12: 364 pg/mL (ref 211–911)

## 2010-06-26 LAB — IRON AND TIBC
%SAT: 3 % — ABNORMAL LOW (ref 20–55)
UIBC: 375 ug/dL

## 2010-06-26 LAB — FERRITIN: Ferritin: 11 ng/mL — ABNORMAL LOW (ref 22–322)

## 2010-06-30 ENCOUNTER — Ambulatory Visit: Payer: Self-pay | Admitting: Oncology

## 2010-07-01 LAB — CBC WITH DIFFERENTIAL/PLATELET
BASO%: 1.2 % (ref 0.0–2.0)
LYMPH%: 57.8 % — ABNORMAL HIGH (ref 14.0–49.0)
MCHC: 31 g/dL — ABNORMAL LOW (ref 32.0–36.0)
MONO#: 0.5 10*3/uL (ref 0.1–0.9)
Platelets: 287 10*3/uL (ref 140–400)
RBC: 3.88 10*6/uL — ABNORMAL LOW (ref 4.20–5.82)
RDW: 15.6 % — ABNORMAL HIGH (ref 11.0–14.6)
WBC: 1.7 10*3/uL — ABNORMAL LOW (ref 4.0–10.3)
lymph#: 1 10*3/uL (ref 0.9–3.3)
nRBC: 0 % (ref 0–0)

## 2010-07-03 ENCOUNTER — Encounter: Payer: Self-pay | Admitting: Gastroenterology

## 2010-07-03 LAB — IRON AND TIBC: UIBC: 347 ug/dL

## 2010-07-09 ENCOUNTER — Ambulatory Visit: Payer: Self-pay | Admitting: Gastroenterology

## 2010-07-09 DIAGNOSIS — E119 Type 2 diabetes mellitus without complications: Secondary | ICD-10-CM

## 2010-07-09 DIAGNOSIS — Z862 Personal history of diseases of the blood and blood-forming organs and certain disorders involving the immune mechanism: Secondary | ICD-10-CM

## 2010-07-09 LAB — CBC WITH DIFFERENTIAL/PLATELET
Basophils Absolute: 0.1 10*3/uL (ref 0.0–0.1)
Eosinophils Absolute: 0 10*3/uL (ref 0.0–0.5)
HGB: 10.2 g/dL — ABNORMAL LOW (ref 13.0–17.1)
LYMPH%: 27.3 % (ref 14.0–49.0)
MCV: 76.3 fL — ABNORMAL LOW (ref 79.3–98.0)
MONO%: 17.3 % — ABNORMAL HIGH (ref 0.0–14.0)
NEUT#: 1.8 10*3/uL (ref 1.5–6.5)
NEUT%: 53.3 % (ref 39.0–75.0)
Platelets: 303 10*3/uL (ref 140–400)
RBC: 4.27 10*6/uL (ref 4.20–5.82)

## 2010-07-10 LAB — HEAVY METALS SCREEN, URINE: Lead, 24 hr urine: <1 mcg/L (ref <81)

## 2010-07-10 LAB — CREATININE CLEARANCE, URINE, 24 HOUR
Creatinine, 24H Ur: 1545 mg/d (ref 800–2000)
Creatinine, Urine: 77.3 mg/dL
Creatinine: 0.89 mg/dL (ref 0.40–1.50)

## 2010-07-13 ENCOUNTER — Telehealth: Payer: Self-pay | Admitting: Gastroenterology

## 2010-07-16 LAB — CBC WITH DIFFERENTIAL/PLATELET
BASO%: 1.9 % (ref 0.0–2.0)
LYMPH%: 37 % (ref 14.0–49.0)
MCHC: 31.7 g/dL — ABNORMAL LOW (ref 32.0–36.0)
MCV: 78.6 fL — ABNORMAL LOW (ref 79.3–98.0)
MONO#: 0.4 10*3/uL (ref 0.1–0.9)
MONO%: 40.7 % — ABNORMAL HIGH (ref 0.0–14.0)
Platelets: 260 10*3/uL (ref 140–400)
RBC: 4.25 10*6/uL (ref 4.20–5.82)
WBC: 1.1 10*3/uL — ABNORMAL LOW (ref 4.0–10.3)
nRBC: 0 % (ref 0–0)

## 2010-07-30 ENCOUNTER — Ambulatory Visit: Payer: Self-pay | Admitting: Oncology

## 2010-07-30 LAB — CBC WITH DIFFERENTIAL/PLATELET
Basophils Absolute: 0 10*3/uL (ref 0.0–0.1)
Eosinophils Absolute: 0 10*3/uL (ref 0.0–0.5)
HCT: 36.6 % — ABNORMAL LOW (ref 38.4–49.9)
HGB: 12 g/dL — ABNORMAL LOW (ref 13.0–17.1)
LYMPH%: 38.5 % (ref 14.0–49.0)
MCV: 80 fL (ref 79.3–98.0)
MONO%: 21 % — ABNORMAL HIGH (ref 0.0–14.0)
NEUT#: 0.7 10*3/uL — ABNORMAL LOW (ref 1.5–6.5)
Platelets: 236 10*3/uL (ref 140–400)

## 2010-08-04 ENCOUNTER — Encounter (INDEPENDENT_AMBULATORY_CARE_PROVIDER_SITE_OTHER): Payer: Self-pay | Admitting: *Deleted

## 2010-08-07 ENCOUNTER — Encounter (INDEPENDENT_AMBULATORY_CARE_PROVIDER_SITE_OTHER): Payer: Self-pay | Admitting: *Deleted

## 2010-08-07 ENCOUNTER — Ambulatory Visit: Payer: Self-pay | Admitting: Gastroenterology

## 2010-08-13 ENCOUNTER — Encounter: Payer: Self-pay | Admitting: Gastroenterology

## 2010-08-13 LAB — CBC WITH DIFFERENTIAL/PLATELET
Basophils Absolute: 0 10*3/uL (ref 0.0–0.1)
EOS%: 2.8 % (ref 0.0–7.0)
Eosinophils Absolute: 0.1 10*3/uL (ref 0.0–0.5)
HCT: 36.6 % — ABNORMAL LOW (ref 38.4–49.9)
HGB: 12.1 g/dL — ABNORMAL LOW (ref 13.0–17.1)
MCH: 26.5 pg — ABNORMAL LOW (ref 27.2–33.4)
MONO#: 0.6 10*3/uL (ref 0.1–0.9)
NEUT%: 15 % — ABNORMAL LOW (ref 39.0–75.0)
lymph#: 0.9 10*3/uL (ref 0.9–3.3)

## 2010-08-13 LAB — WHOLE BLOOD GLUCOSE: HRS PC: 5.5 Hours

## 2010-08-14 LAB — LACTATE DEHYDROGENASE: LDH: 119 U/L (ref 94–250)

## 2010-08-14 LAB — COMPREHENSIVE METABOLIC PANEL
ALT: 13 U/L (ref 0–53)
Albumin: 3.7 g/dL (ref 3.5–5.2)
CO2: 26 mEq/L (ref 19–32)
Calcium: 9.2 mg/dL (ref 8.4–10.5)
Chloride: 93 mEq/L — ABNORMAL LOW (ref 96–112)
Potassium: 4.1 mEq/L (ref 3.5–5.3)
Sodium: 134 mEq/L — ABNORMAL LOW (ref 135–145)
Total Protein: 6.2 g/dL (ref 6.0–8.3)

## 2010-08-24 ENCOUNTER — Telehealth (INDEPENDENT_AMBULATORY_CARE_PROVIDER_SITE_OTHER): Payer: Self-pay | Admitting: *Deleted

## 2010-08-24 ENCOUNTER — Encounter (INDEPENDENT_AMBULATORY_CARE_PROVIDER_SITE_OTHER): Payer: Self-pay | Admitting: *Deleted

## 2010-08-27 LAB — CBC WITH DIFFERENTIAL/PLATELET
BASO%: 2.4 % — ABNORMAL HIGH (ref 0.0–2.0)
EOS%: 4.7 % (ref 0.0–7.0)
MCH: 26.4 pg — ABNORMAL LOW (ref 27.2–33.4)
MCHC: 32.7 g/dL (ref 32.0–36.0)
MONO#: 0.5 10*3/uL (ref 0.1–0.9)
RBC: 4.21 10*6/uL (ref 4.20–5.82)
RDW: 18.5 % — ABNORMAL HIGH (ref 11.0–14.6)
WBC: 1.7 10*3/uL — ABNORMAL LOW (ref 4.0–10.3)
lymph#: 0.9 10*3/uL (ref 0.9–3.3)
nRBC: 0 % (ref 0–0)

## 2010-08-27 LAB — BASIC METABOLIC PANEL
Chloride: 94 mEq/L — ABNORMAL LOW (ref 96–112)
Potassium: 4.2 mEq/L (ref 3.5–5.3)

## 2010-09-03 ENCOUNTER — Ambulatory Visit (HOSPITAL_COMMUNITY): Admission: RE | Admit: 2010-09-03 | Discharge: 2010-09-03 | Payer: Self-pay | Admitting: Gastroenterology

## 2010-09-03 ENCOUNTER — Telehealth: Payer: Self-pay | Admitting: Gastroenterology

## 2010-09-03 ENCOUNTER — Ambulatory Visit: Payer: Self-pay | Admitting: Gastroenterology

## 2010-09-07 ENCOUNTER — Ambulatory Visit: Payer: Self-pay | Admitting: Oncology

## 2010-09-07 ENCOUNTER — Ambulatory Visit: Payer: Self-pay | Admitting: Cardiology

## 2010-09-07 ENCOUNTER — Telehealth (INDEPENDENT_AMBULATORY_CARE_PROVIDER_SITE_OTHER): Payer: Self-pay | Admitting: *Deleted

## 2010-09-07 DIAGNOSIS — C187 Malignant neoplasm of sigmoid colon: Secondary | ICD-10-CM | POA: Insufficient documentation

## 2010-09-08 ENCOUNTER — Ambulatory Visit: Payer: Self-pay | Admitting: Gastroenterology

## 2010-09-08 LAB — CONVERTED CEMR LAB: Creatinine, Ser: 1.1 mg/dL (ref 0.4–1.5)

## 2010-09-09 ENCOUNTER — Ambulatory Visit: Payer: Self-pay | Admitting: Internal Medicine

## 2010-09-09 LAB — CBC WITH DIFFERENTIAL/PLATELET
Basophils Absolute: 0 10*3/uL (ref 0.0–0.1)
Eosinophils Absolute: 0.1 10*3/uL (ref 0.0–0.5)
HGB: 10.7 g/dL — ABNORMAL LOW (ref 13.0–17.1)
LYMPH%: 48.8 % (ref 14.0–49.0)
MCV: 80.8 fL (ref 79.3–98.0)
MONO#: 0.5 10*3/uL (ref 0.1–0.9)
MONO%: 31.8 % — ABNORMAL HIGH (ref 0.0–14.0)
NEUT#: 0.2 10*3/uL — CL (ref 1.5–6.5)
Platelets: 236 10*3/uL (ref 140–400)
RDW: 18 % — ABNORMAL HIGH (ref 11.0–14.6)
WBC: 1.7 10*3/uL — ABNORMAL LOW (ref 4.0–10.3)
nRBC: 0 % (ref 0–0)

## 2010-09-09 LAB — BASIC METABOLIC PANEL
BUN: 13 mg/dL (ref 6–23)
Glucose, Bld: 233 mg/dL — ABNORMAL HIGH (ref 70–99)
Potassium: 4 mEq/L (ref 3.5–5.3)

## 2010-09-14 ENCOUNTER — Encounter: Payer: Self-pay | Admitting: Gastroenterology

## 2010-09-16 ENCOUNTER — Encounter: Payer: Self-pay | Admitting: Gastroenterology

## 2010-09-30 ENCOUNTER — Encounter: Payer: Self-pay | Admitting: Gastroenterology

## 2010-10-07 ENCOUNTER — Encounter: Payer: Self-pay | Admitting: Gastroenterology

## 2010-10-25 ENCOUNTER — Inpatient Hospital Stay (HOSPITAL_COMMUNITY): Admission: EM | Admit: 2010-10-25 | Discharge: 2010-10-29 | Payer: Self-pay | Admitting: Emergency Medicine

## 2010-10-25 ENCOUNTER — Ambulatory Visit: Payer: Self-pay | Admitting: Family Medicine

## 2010-10-27 ENCOUNTER — Ambulatory Visit: Payer: Self-pay | Admitting: Oncology

## 2010-11-03 ENCOUNTER — Ambulatory Visit: Payer: Self-pay | Admitting: Oncology

## 2010-11-04 ENCOUNTER — Ambulatory Visit (HOSPITAL_COMMUNITY): Admission: RE | Admit: 2010-11-04 | Discharge: 2010-11-04 | Payer: Self-pay | Admitting: Neurology

## 2010-11-09 LAB — CBC WITH DIFFERENTIAL/PLATELET
Basophils Absolute: 0 10*3/uL (ref 0.0–0.1)
Eosinophils Absolute: 0 10*3/uL (ref 0.0–0.5)
HCT: 31.7 % — ABNORMAL LOW (ref 38.4–49.9)
HGB: 10.7 g/dL — ABNORMAL LOW (ref 13.0–17.1)
MCH: 28 pg (ref 27.2–33.4)
MONO#: 0.5 10*3/uL (ref 0.1–0.9)
NEUT#: 9.8 10*3/uL — ABNORMAL HIGH (ref 1.5–6.5)
NEUT%: 80.8 % — ABNORMAL HIGH (ref 39.0–75.0)
RDW: 16.4 % — ABNORMAL HIGH (ref 11.0–14.6)
WBC: 12.1 10*3/uL — ABNORMAL HIGH (ref 4.0–10.3)
lymph#: 1.8 10*3/uL (ref 0.9–3.3)

## 2010-11-11 ENCOUNTER — Encounter (INDEPENDENT_AMBULATORY_CARE_PROVIDER_SITE_OTHER): Payer: Self-pay | Admitting: *Deleted

## 2010-11-16 ENCOUNTER — Encounter: Payer: Self-pay | Admitting: Gastroenterology

## 2010-11-20 LAB — CBC WITH DIFFERENTIAL/PLATELET
Basophils Absolute: 0 10*3/uL (ref 0.0–0.1)
Eosinophils Absolute: 0.2 10*3/uL (ref 0.0–0.5)
HCT: 32.7 % — ABNORMAL LOW (ref 38.4–49.9)
HGB: 11.4 g/dL — ABNORMAL LOW (ref 13.0–17.1)
LYMPH%: 42.7 % (ref 14.0–49.0)
MCV: 81.5 fL (ref 79.3–98.0)
MONO#: 0.8 10*3/uL (ref 0.1–0.9)
MONO%: 19.7 % — ABNORMAL HIGH (ref 0.0–14.0)
NEUT#: 1.3 10*3/uL — ABNORMAL LOW (ref 1.5–6.5)
Platelets: 247 10*3/uL (ref 140–400)
WBC: 4 10*3/uL (ref 4.0–10.3)

## 2010-11-27 LAB — CBC WITH DIFFERENTIAL/PLATELET
BASO%: 0.9 % (ref 0.0–2.0)
Basophils Absolute: 0 10*3/uL (ref 0.0–0.1)
EOS%: 3.9 % (ref 0.0–7.0)
Eosinophils Absolute: 0.1 10*3/uL (ref 0.0–0.5)
HCT: 33.1 % — ABNORMAL LOW (ref 38.4–49.9)
HGB: 11.1 g/dL — ABNORMAL LOW (ref 13.0–17.1)
LYMPH%: 46.4 % (ref 14.0–49.0)
MCH: 27.1 pg — ABNORMAL LOW (ref 27.2–33.4)
MCHC: 33.4 g/dL (ref 32.0–36.0)
MCV: 81 fL (ref 79.3–98.0)
MONO#: 0.7 10*3/uL (ref 0.1–0.9)
MONO%: 19.9 % — ABNORMAL HIGH (ref 0.0–14.0)
NEUT#: 1 10*3/uL — ABNORMAL LOW (ref 1.5–6.5)
NEUT%: 28.9 % — ABNORMAL LOW (ref 39.0–75.0)
Platelets: 204 10*3/uL (ref 140–400)
RBC: 4.09 10*6/uL — ABNORMAL LOW (ref 4.20–5.82)
RDW: 16.8 % — ABNORMAL HIGH (ref 11.0–14.6)
WBC: 3.6 10*3/uL — ABNORMAL LOW (ref 4.0–10.3)
lymph#: 1.7 10*3/uL (ref 0.9–3.3)

## 2010-12-03 ENCOUNTER — Encounter: Payer: Self-pay | Admitting: Gastroenterology

## 2010-12-03 ENCOUNTER — Ambulatory Visit: Payer: Self-pay | Admitting: Oncology

## 2010-12-03 LAB — CBC WITH DIFFERENTIAL/PLATELET
BASO%: 0.8 % (ref 0.0–2.0)
Eosinophils Absolute: 0.1 10*3/uL (ref 0.0–0.5)
HCT: 33 % — ABNORMAL LOW (ref 38.4–49.9)
LYMPH%: 25.6 % (ref 14.0–49.0)
MCHC: 34.3 g/dL (ref 32.0–36.0)
MCV: 81.3 fL (ref 79.3–98.0)
MONO#: 0.9 10*3/uL (ref 0.1–0.9)
MONO%: 16.1 % — ABNORMAL HIGH (ref 0.0–14.0)
NEUT%: 55.2 % (ref 39.0–75.0)
Platelets: 238 10*3/uL (ref 140–400)
RBC: 4.06 10*6/uL — ABNORMAL LOW (ref 4.20–5.82)
WBC: 5.6 10*3/uL (ref 4.0–10.3)

## 2010-12-04 LAB — IRON AND TIBC
Iron: 29 ug/dL — ABNORMAL LOW (ref 42–165)
TIBC: 398 ug/dL (ref 215–435)
UIBC: 369 ug/dL

## 2010-12-04 LAB — COMPREHENSIVE METABOLIC PANEL
Alkaline Phosphatase: 93 U/L (ref 39–117)
CO2: 27 mEq/L (ref 19–32)
Creatinine, Ser: 0.93 mg/dL (ref 0.40–1.50)
Glucose, Bld: 196 mg/dL — ABNORMAL HIGH (ref 70–99)
Sodium: 137 mEq/L (ref 135–145)
Total Bilirubin: 0.3 mg/dL (ref 0.3–1.2)

## 2010-12-04 LAB — CEA: CEA: 1.4 ng/mL (ref 0.0–5.0)

## 2010-12-04 LAB — LACTATE DEHYDROGENASE: LDH: 126 U/L (ref 94–250)

## 2010-12-10 LAB — CBC WITH DIFFERENTIAL/PLATELET
BASO%: 3.2 % — ABNORMAL HIGH (ref 0.0–2.0)
EOS%: 5 % (ref 0.0–7.0)
HCT: 33.4 % — ABNORMAL LOW (ref 38.4–49.9)
LYMPH%: 61.8 % — ABNORMAL HIGH (ref 14.0–49.0)
MCH: 26.3 pg — ABNORMAL LOW (ref 27.2–33.4)
MCHC: 32.9 g/dL (ref 32.0–36.0)
MCV: 79.7 fL (ref 79.3–98.0)
MONO#: 0.4 10*3/uL (ref 0.1–0.9)
MONO%: 19.1 % — ABNORMAL HIGH (ref 0.0–14.0)
NEUT%: 10.9 % — ABNORMAL LOW (ref 39.0–75.0)
Platelets: 207 10*3/uL (ref 140–400)
RBC: 4.19 10*6/uL — ABNORMAL LOW (ref 4.20–5.82)

## 2010-12-13 HISTORY — PX: ILEOSTOMY: SHX1783

## 2010-12-17 ENCOUNTER — Ambulatory Visit: Payer: Self-pay | Admitting: Oncology

## 2010-12-17 LAB — CBC WITH DIFFERENTIAL/PLATELET
BASO%: 0.4 % (ref 0.0–2.0)
Basophils Absolute: 0 10*3/uL (ref 0.0–0.1)
EOS%: 2.9 % (ref 0.0–7.0)
Eosinophils Absolute: 0.2 10*3/uL (ref 0.0–0.5)
HCT: 33.5 % — ABNORMAL LOW (ref 38.4–49.9)
HGB: 11.2 g/dL — ABNORMAL LOW (ref 13.0–17.1)
LYMPH%: 25.4 % (ref 14.0–49.0)
MCH: 27.1 pg — ABNORMAL LOW (ref 27.2–33.4)
MCHC: 33.5 g/dL (ref 32.0–36.0)
MCV: 80.9 fL (ref 79.3–98.0)
MONO#: 0.7 10*3/uL (ref 0.1–0.9)
MONO%: 10.4 % (ref 0.0–14.0)
NEUT#: 4.1 10*3/uL (ref 1.5–6.5)
NEUT%: 60.9 % (ref 39.0–75.0)
Platelets: 242 10*3/uL (ref 140–400)
RBC: 4.14 10*6/uL — ABNORMAL LOW (ref 4.20–5.82)
RDW: 17.5 % — ABNORMAL HIGH (ref 11.0–14.6)
WBC: 6.8 10*3/uL (ref 4.0–10.3)
lymph#: 1.7 10*3/uL (ref 0.9–3.3)

## 2010-12-18 LAB — COMPREHENSIVE METABOLIC PANEL
ALT: 20 U/L (ref 0–53)
AST: 20 U/L (ref 0–37)
Albumin: 4 g/dL (ref 3.5–5.2)
Alkaline Phosphatase: 88 U/L (ref 39–117)
BUN: 15 mg/dL (ref 6–23)
CO2: 31 mEq/L (ref 19–32)
Calcium: 9.5 mg/dL (ref 8.4–10.5)
Chloride: 100 mEq/L (ref 96–112)
Creatinine, Ser: 1.16 mg/dL (ref 0.4–1.5)
GFR calc Af Amer: >60 mL/min (ref >60)
GFR calc non Af Amer: >60 mL/min (ref >60)
Glucose, Bld: 151 mg/dL — ABNORMAL HIGH (ref 70–99)
Potassium: 4.2 mEq/L (ref 3.5–5.1)
Sodium: 138 mEq/L (ref 135–145)
Total Bilirubin: 0.5 mg/dL (ref 0.3–1.2)
Total Protein: 6.6 g/dL (ref 6.0–8.3)

## 2010-12-18 LAB — CBC
HCT: 35.9 % — ABNORMAL LOW (ref 39.0–52.0)
Hemoglobin: 11.7 g/dL — ABNORMAL LOW (ref 13.0–17.0)
MCH: 26.7 pg (ref 26.0–34.0)
MCHC: 32.6 g/dL (ref 30.0–36.0)
MCV: 82 fL (ref 78.0–100.0)
Platelets: 217 10*3/uL (ref 150–400)
RBC: 4.38 MIL/uL (ref 4.22–5.81)
RDW: 15.8 % — ABNORMAL HIGH (ref 11.5–15.5)
WBC: 7 10*3/uL (ref 4.0–10.5)

## 2010-12-18 LAB — DIFFERENTIAL
Basophils Absolute: 0.1 10*3/uL (ref 0.0–0.1)
Basophils Relative: 1 % (ref 0–1)
Eosinophils Absolute: 0.1 10*3/uL (ref 0.0–0.7)
Eosinophils Relative: 2 % (ref 0–5)
Lymphocytes Relative: 31 % (ref 12–46)
Lymphs Abs: 2.2 10*3/uL (ref 0.7–4.0)
Monocytes Absolute: 0.9 10*3/uL (ref 0.1–1.0)
Monocytes Relative: 13 % — ABNORMAL HIGH (ref 3–12)
Neutro Abs: 3.7 10*3/uL (ref 1.7–7.7)
Neutrophils Relative %: 53 % (ref 43–77)

## 2010-12-23 ENCOUNTER — Encounter (INDEPENDENT_AMBULATORY_CARE_PROVIDER_SITE_OTHER): Payer: Self-pay | Admitting: Surgery

## 2010-12-23 ENCOUNTER — Inpatient Hospital Stay (HOSPITAL_COMMUNITY)
Admission: RE | Admit: 2010-12-23 | Discharge: 2010-12-27 | Payer: Self-pay | Source: Home / Self Care | Attending: Surgery | Admitting: Surgery

## 2010-12-28 LAB — COMPREHENSIVE METABOLIC PANEL
ALT: 17 U/L (ref 0–53)
ALT: 16 U/L (ref 0–53)
AST: 19 U/L (ref 0–37)
AST: 20 U/L (ref 0–37)
Albumin: 3.2 g/dL — ABNORMAL LOW (ref 3.5–5.2)
Albumin: 3.1 g/dL — ABNORMAL LOW (ref 3.5–5.2)
Alkaline Phosphatase: 70 U/L (ref 39–117)
Alkaline Phosphatase: 74 U/L (ref 39–117)
BUN: 8 mg/dL (ref 6–23)
BUN: 5 mg/dL — ABNORMAL LOW (ref 6–23)
CO2: 27 mEq/L (ref 19–32)
CO2: 27 mEq/L (ref 19–32)
Calcium: 8.8 mg/dL (ref 8.4–10.5)
Calcium: 8.6 mg/dL (ref 8.4–10.5)
Chloride: 104 mEq/L (ref 96–112)
Chloride: 100 mEq/L (ref 96–112)
Creatinine, Ser: 0.98 mg/dL (ref 0.4–1.5)
Creatinine, Ser: 0.88 mg/dL (ref 0.4–1.5)
GFR calc Af Amer: >60 mL/min (ref >60)
GFR calc Af Amer: >60 mL/min (ref >60)
GFR calc non Af Amer: >60 mL/min (ref >60)
GFR calc non Af Amer: >60 mL/min (ref >60)
Glucose, Bld: 185 mg/dL — ABNORMAL HIGH (ref 70–99)
Glucose, Bld: 198 mg/dL — ABNORMAL HIGH (ref 70–99)
Potassium: 3.6 mEq/L (ref 3.5–5.1)
Potassium: 3.8 mEq/L (ref 3.5–5.1)
Sodium: 138 mEq/L (ref 135–145)
Sodium: 135 mEq/L (ref 135–145)
Total Bilirubin: 0.5 mg/dL (ref 0.3–1.2)
Total Bilirubin: 0.5 mg/dL (ref 0.3–1.2)
Total Protein: 5.7 g/dL — ABNORMAL LOW (ref 6.0–8.3)
Total Protein: 5.6 g/dL — ABNORMAL LOW (ref 6.0–8.3)

## 2010-12-28 LAB — CBC
HCT: 32.3 % — ABNORMAL LOW (ref 39.0–52.0)
HCT: 32.2 % — ABNORMAL LOW (ref 39.0–52.0)
HCT: 34.7 % — ABNORMAL LOW (ref 39.0–52.0)
Hemoglobin: 10.8 g/dL — ABNORMAL LOW (ref 13.0–17.0)
Hemoglobin: 10.6 g/dL — ABNORMAL LOW (ref 13.0–17.0)
Hemoglobin: 11.5 g/dL — ABNORMAL LOW (ref 13.0–17.0)
MCH: 26.6 pg (ref 26.0–34.0)
MCH: 26.2 pg (ref 26.0–34.0)
MCH: 26.4 pg (ref 26.0–34.0)
MCHC: 33.4 g/dL (ref 30.0–36.0)
MCHC: 32.9 g/dL (ref 30.0–36.0)
MCHC: 33.1 g/dL (ref 30.0–36.0)
MCV: 79.6 fL (ref 78.0–100.0)
MCV: 79.5 fL (ref 78.0–100.0)
MCV: 79.8 fL (ref 78.0–100.0)
Platelets: 159 10*3/uL (ref 150–400)
Platelets: 160 10*3/uL (ref 150–400)
Platelets: 181 10*3/uL (ref 150–400)
RBC: 4.06 MIL/uL — ABNORMAL LOW (ref 4.22–5.81)
RBC: 4.05 MIL/uL — ABNORMAL LOW (ref 4.22–5.81)
RBC: 4.35 MIL/uL (ref 4.22–5.81)
RDW: 16 % — ABNORMAL HIGH (ref 11.5–15.5)
RDW: 15.6 % — ABNORMAL HIGH (ref 11.5–15.5)
RDW: 15.6 % — ABNORMAL HIGH (ref 11.5–15.5)
WBC: 5.3 10*3/uL (ref 4.0–10.5)
WBC: 3.5 10*3/uL — ABNORMAL LOW (ref 4.0–10.5)
WBC: 8.2 10*3/uL (ref 4.0–10.5)

## 2010-12-28 LAB — GLUCOSE, CAPILLARY
Glucose-Capillary: 183 mg/dL — ABNORMAL HIGH (ref 70–99)
Glucose-Capillary: 204 mg/dL — ABNORMAL HIGH (ref 70–99)
Glucose-Capillary: 215 mg/dL — ABNORMAL HIGH (ref 70–99)
Glucose-Capillary: 217 mg/dL — ABNORMAL HIGH (ref 70–99)
Glucose-Capillary: 232 mg/dL — ABNORMAL HIGH (ref 70–99)
Glucose-Capillary: 232 mg/dL — ABNORMAL HIGH (ref 70–99)
Glucose-Capillary: 242 mg/dL — ABNORMAL HIGH (ref 70–99)
Glucose-Capillary: 251 mg/dL — ABNORMAL HIGH (ref 70–99)
Glucose-Capillary: 263 mg/dL — ABNORMAL HIGH (ref 70–99)
Glucose-Capillary: 345 mg/dL — ABNORMAL HIGH (ref 70–99)
Glucose-Capillary: 180 mg/dL — ABNORMAL HIGH (ref 70–99)
Glucose-Capillary: 181 mg/dL — ABNORMAL HIGH (ref 70–99)
Glucose-Capillary: 185 mg/dL — ABNORMAL HIGH (ref 70–99)
Glucose-Capillary: 197 mg/dL — ABNORMAL HIGH (ref 70–99)
Glucose-Capillary: 202 mg/dL — ABNORMAL HIGH (ref 70–99)
Glucose-Capillary: 207 mg/dL — ABNORMAL HIGH (ref 70–99)
Glucose-Capillary: 210 mg/dL — ABNORMAL HIGH (ref 70–99)
Glucose-Capillary: 211 mg/dL — ABNORMAL HIGH (ref 70–99)
Glucose-Capillary: 219 mg/dL — ABNORMAL HIGH (ref 70–99)
Glucose-Capillary: 240 mg/dL — ABNORMAL HIGH (ref 70–99)
Glucose-Capillary: 195 mg/dL — ABNORMAL HIGH (ref 70–99)

## 2010-12-28 LAB — HEMOGLOBIN A1C
Hgb A1c MFr Bld: 8.6 % — ABNORMAL HIGH (ref <5.7)
Mean Plasma Glucose: 200 mg/dL — ABNORMAL HIGH (ref <117)

## 2010-12-28 LAB — SURGICAL PCR SCREEN
MRSA, PCR: NEGATIVE
Staphylococcus aureus: NEGATIVE

## 2010-12-29 ENCOUNTER — Inpatient Hospital Stay (HOSPITAL_COMMUNITY)
Admission: EM | Admit: 2010-12-29 | Discharge: 2010-12-31 | Payer: Self-pay | Source: Home / Self Care | Attending: Surgery | Admitting: Surgery

## 2010-12-30 ENCOUNTER — Encounter: Payer: Self-pay | Admitting: Internal Medicine

## 2010-12-30 LAB — CROSSMATCH
ABO/RH(D): O POS
Antibody Screen: NEGATIVE

## 2010-12-30 LAB — DIFFERENTIAL
Basophils Absolute: 0 10*3/uL (ref 0.0–0.1)
Basophils Relative: 0 % (ref 0–1)
Eosinophils Absolute: 0.7 10*3/uL (ref 0.0–0.7)
Eosinophils Relative: 3 % (ref 0–5)
Lymphocytes Relative: 11 % — ABNORMAL LOW (ref 12–46)
Lymphs Abs: 2.4 10*3/uL (ref 0.7–4.0)
Monocytes Absolute: 0.9 10*3/uL (ref 0.1–1.0)
Monocytes Relative: 4 % (ref 3–12)
Neutro Abs: 18.1 10*3/uL — ABNORMAL HIGH (ref 1.7–7.7)
Neutrophils Relative %: 82 % — ABNORMAL HIGH (ref 43–77)

## 2010-12-30 LAB — CBC
HCT: 32.7 % — ABNORMAL LOW (ref 39.0–52.0)
Hemoglobin: 10.9 g/dL — ABNORMAL LOW (ref 13.0–17.0)
MCH: 26.1 pg (ref 26.0–34.0)
MCHC: 33.3 g/dL (ref 30.0–36.0)
MCV: 78.2 fL (ref 78.0–100.0)
Platelets: 244 10*3/uL (ref 150–400)
RBC: 4.18 MIL/uL — ABNORMAL LOW (ref 4.22–5.81)
RDW: 15.9 % — ABNORMAL HIGH (ref 11.5–15.5)
WBC: 22.1 10*3/uL — ABNORMAL HIGH (ref 4.0–10.5)

## 2010-12-30 LAB — BASIC METABOLIC PANEL
BUN: 21 mg/dL (ref 6–23)
CO2: 27 mEq/L (ref 19–32)
Calcium: 8.7 mg/dL (ref 8.4–10.5)
Chloride: 92 mEq/L — ABNORMAL LOW (ref 96–112)
Creatinine, Ser: 1.79 mg/dL — ABNORMAL HIGH (ref 0.4–1.5)
GFR calc Af Amer: 45 mL/min — ABNORMAL LOW (ref >60)
GFR calc non Af Amer: 37 mL/min — ABNORMAL LOW (ref >60)
Glucose, Bld: 178 mg/dL — ABNORMAL HIGH (ref 70–99)
Potassium: 3.5 mEq/L (ref 3.5–5.1)
Sodium: 128 mEq/L — ABNORMAL LOW (ref 135–145)

## 2010-12-30 LAB — ABO/RH: ABO/RH(D): O POS

## 2010-12-30 LAB — PROTIME-INR
INR: 1.02 (ref 0.00–1.49)
Prothrombin Time: 13.6 seconds (ref 11.6–15.2)

## 2010-12-30 LAB — APTT: aPTT: 31 seconds (ref 24–37)

## 2010-12-31 ENCOUNTER — Encounter: Payer: Self-pay | Admitting: Internal Medicine

## 2011-01-01 ENCOUNTER — Telehealth: Payer: Self-pay | Admitting: Internal Medicine

## 2011-01-02 ENCOUNTER — Encounter: Payer: Self-pay | Admitting: Neurology

## 2011-01-04 LAB — DIFFERENTIAL
Basophils Relative: 1 % (ref 0–1)
Lymphocytes Relative: 25 % (ref 12–46)
Lymphs Abs: 1.6 10*3/uL (ref 0.7–4.0)
Monocytes Absolute: 0.5 10*3/uL (ref 0.1–1.0)
Monocytes Relative: 8 % (ref 3–12)
Neutro Abs: 3.6 10*3/uL (ref 1.7–7.7)
Neutrophils Relative %: 57 % (ref 43–77)

## 2011-01-04 LAB — BASIC METABOLIC PANEL
BUN: 19 mg/dL (ref 6–23)
CO2: 27 mEq/L (ref 19–32)
Calcium: 8.7 mg/dL (ref 8.4–10.5)
Chloride: 96 mEq/L (ref 96–112)
Creatinine, Ser: 1.58 mg/dL — ABNORMAL HIGH (ref 0.4–1.5)
GFR calc Af Amer: 52 mL/min — ABNORMAL LOW (ref >60)
GFR calc non Af Amer: 43 mL/min — ABNORMAL LOW (ref >60)
Glucose, Bld: 191 mg/dL — ABNORMAL HIGH (ref 70–99)
Potassium: 3.6 mEq/L (ref 3.5–5.1)
Sodium: 132 mEq/L — ABNORMAL LOW (ref 135–145)

## 2011-01-04 LAB — CBC
HCT: 30.5 % — ABNORMAL LOW (ref 39.0–52.0)
Hemoglobin: 10.3 g/dL — ABNORMAL LOW (ref 13.0–17.0)
Hemoglobin: 10.4 g/dL — ABNORMAL LOW (ref 13.0–17.0)
MCH: 26.2 pg (ref 26.0–34.0)
MCV: 77.6 fL — ABNORMAL LOW (ref 78.0–100.0)
MCV: 77.1 fL — ABNORMAL LOW (ref 78.0–100.0)
Platelets: 231 10*3/uL (ref 150–400)
RBC: 3.93 MIL/uL — ABNORMAL LOW (ref 4.22–5.81)
RBC: 3.98 MIL/uL — ABNORMAL LOW (ref 4.22–5.81)
WBC: 2.2 10*3/uL — ABNORMAL LOW (ref 4.0–10.5)

## 2011-01-04 LAB — GLUCOSE, CAPILLARY
Glucose-Capillary: 170 mg/dL — ABNORMAL HIGH (ref 70–99)
Glucose-Capillary: 177 mg/dL — ABNORMAL HIGH (ref 70–99)
Glucose-Capillary: 182 mg/dL — ABNORMAL HIGH (ref 70–99)
Glucose-Capillary: 216 mg/dL — ABNORMAL HIGH (ref 70–99)

## 2011-01-04 LAB — MRSA PCR SCREENING: MRSA by PCR: NEGATIVE

## 2011-01-07 ENCOUNTER — Encounter: Payer: Self-pay | Admitting: Gastroenterology

## 2011-01-07 LAB — CBC WITH DIFFERENTIAL/PLATELET
Basophils Absolute: 0.1 10*3/uL (ref 0.0–0.1)
Eosinophils Absolute: 0.4 10*3/uL (ref 0.0–0.5)
HGB: 11.8 g/dL — ABNORMAL LOW (ref 13.0–17.1)
MCV: 80.4 fL (ref 79.3–98.0)
NEUT#: 5 10*3/uL (ref 1.5–6.5)
RDW: 18.6 % — ABNORMAL HIGH (ref 11.0–14.6)
lymph#: 1.4 10*3/uL (ref 0.9–3.3)

## 2011-01-08 LAB — COMPREHENSIVE METABOLIC PANEL
Albumin: 4.3 g/dL (ref 3.5–5.2)
BUN: 29 mg/dL — ABNORMAL HIGH (ref 6–23)
CO2: 23 mEq/L (ref 19–32)
Calcium: 9.6 mg/dL (ref 8.4–10.5)
Chloride: 96 mEq/L (ref 96–112)
Glucose, Bld: 275 mg/dL — ABNORMAL HIGH (ref 70–99)
Potassium: 5 mEq/L (ref 3.5–5.3)

## 2011-01-08 LAB — TRANSFERRIN RECEPTOR, SOLUABLE: Transferrin Receptor, Soluble: 11.9 nmol/L

## 2011-01-08 LAB — IRON AND TIBC
Iron: 64 ug/dL (ref 42–165)
UIBC: 316 ug/dL

## 2011-01-14 NOTE — Letter (Signed)
Summary: [facility]  [facility]   Imported By: Lester Hays 12/16/2010 09:17:35  _____________________________________________________________________  External Attachment:    Type:   Image     Comment:   External Document

## 2011-01-14 NOTE — Miscellaneous (Signed)
Summary: rx  Clinical Lists Changes  Medications: Added new medication of CIPROFLOXACIN HCL 500 MG  TABS (CIPROFLOXACIN HCL) Take 1 twice a day times for 3 days - Signed Rx of CIPROFLOXACIN HCL 500 MG  TABS (CIPROFLOXACIN HCL) Take 1 twice a day times for 3 days;  #6 x 0;  Signed;  Entered by: Rachael Fee MD;  Authorized by: Rachael Fee MD;  Method used: Print then Give to Patient    Prescriptions: CIPROFLOXACIN HCL 500 MG  TABS (CIPROFLOXACIN HCL) Take 1 twice a day times for 3 days  #6 x 0   Entered and Authorized by:   Rachael Fee MD   Signed by:   Rachael Fee MD on 09/03/2010   Method used:   Print then Give to Patient   RxID:   [id number]

## 2011-01-14 NOTE — Letter (Signed)
Summary: [facility]  [facility]   Imported By: Sherian Rein 10/07/2010 09:27:45  _____________________________________________________________________  External Attachment:    Type:   Image     Comment:   External Document

## 2011-01-14 NOTE — Miscellaneous (Signed)
Summary: medical record req  Clinical Lists Changes  Rec'd medical record request to go to [facility] fedex picke up: 08/07/2010 Marily Memos  November 11, 2010 12:21 PM

## 2011-01-14 NOTE — Assessment & Plan Note (Signed)
Summary: 6 month fu recv reminder   Visit Type:  Follow-up Primary Provider:  Dr. Elgie Congo   History of Present Illness: 77 year old male presents for a followup visit. He is here with his wife. He denies any angina. He does state that he has been much less active, no longer playing golf. His wife states that he sits most of the day. His memory has clearly gotten worse over the last several years with history of cerebrovascular disease.  Record review finds that the patient was admitted to the hospital back in February with a neutropenic fever. He was treated with antibiotics empirically, and follow up with Dr. Arline Asp was arranged.  Recent CBC revealed WBC 1.2, hemoglobin 11.6, hematocrit 33.7, platelets 273, neutrophils 0.1.  Preventive Screening-Counseling & Management  Alcohol-Tobacco     Smoking Status: never  Current Medications (verified): 1)  Aggrenox 25-200 Mg Xr12h-Cap (Aspirin-Dipyridamole) .... Take 1 Tablet By Mouth Two Times A Day 2)  Aspir-Low 81 Mg Tbec (Aspirin) .... Take 1 Tablet By Mouth Once A Day 3)  Benazepril Hcl 5 Mg Tabs (Benazepril Hcl) .... Take 1/2 Tablet By Mouth Once A Day 4)  Doxazosin Mesylate 4 Mg Tabs (Doxazosin Mesylate) .... Take 1 Tablet By Mouth Once A Day 5)  Finasteride 5 Mg Tabs (Finasteride) .... Take 1 Tablet By Mouth Once A Day 6)  Hydrochlorothiazide 25 Mg Tabs (Hydrochlorothiazide) .... Take 1 Tablet By Mouth Once A Day 7)  Januvia 100 Mg Tabs (Sitagliptin Phosphate) .... Take 1 Tablet By Mouth Once A Day 8)  Metformin Hcl 500 Mg Tabs (Metformin Hcl) .... Take 1 Tablet By Mouth in Morning and 2 At Night 9)  Pravastatin Sodium 20 Mg Tabs (Pravastatin Sodium) .... Take 1 Tablet By Mouth Once A Day  Allergies (verified): No Known Drug Allergies  Comments:  Nurse/Medical Assistant: The patient's medications and allergies were reviewed with the patient and were updated in the Medication and Allergy Lists. List reviewed.  Past  History:  Social History: Last updated: 01/16/2010 Retired  Married  Tobacco Use - No Alcohol Use - no Drug Use - no  Past Medical History: Mild aortic root dilatation Type 2 diabetes mellitus History of stroke Hyperlipidemia Hypertension CAD - based on cardiolite 2006 (inferior ischemia), LVEF 60% Leukopenia - Dr. Arline Asp  Clinical Review Panels:  Echocardiogram Echocardiogram  SUMMARY   -  Overall left ventricular systolic function was normal. Left         ventricular ejection fraction was estimated , range being 55         % to 60 %. This study was inadequate for the evaluation of         left ventricular regional wall motion.   -  There was mild aortic valvular regurgitation.   -  There was mild aortic root dilatation.   -  Left atrial size was at the upper limits of normal. (02/20/2009)    Review of Systems  The patient denies anorexia, fever, chest pain, syncope, headaches, hemoptysis, melena, and hematochezia.         Otherwise reviewed and negative except as outlined.  Vital Signs:  Patient profile:   77 year old male Height:      75 inches Weight:      202 pounds BMI:     25.34 Pulse rate:   86 / minute BP sitting:   116 / 72  (left arm) Cuff size:   large  Vitals Entered By: Carlye Grippe (March 16, 2010  10:21 AM)  Nutrition Counseling: Patient's BMI is greater than 25 and therefore counseled on weight management options.  Physical Exam  Additional Exam:  Overweight male in no acute distress. HEENT: Conjunctiva and lids normal, oropharynx with moist mucosa. Neck: Supple, no elevated venous pressure. Lungs: Diminished breath sounds, and nonlabored. Cardiac: Regular rate and rhythm, no significant systolic murmur or S3. Extremities: No pitting edema. Skin: Warm and dry, no rashes.   Impression & Recommendations:  Problem # 1:  CAD, NATIVE VESSEL (ICD-414.01)  Symptomatically stable with presumed cardiovascular disease based on previous  Cardiolite demonstrating inferior ischemia. This has been managed medically after several discussions with the patient. Reviewed this again today. I plan to see him back in 6 months, sooner if symptoms intervene.  His updated medication list for this problem includes:    Aggrenox 25-200 Mg Xr12h-cap (Aspirin-dipyridamole) .Marland Kitchen... Take 1 tablet by mouth two times a day    Aspir-low 81 Mg Tbec (Aspirin) .Marland Kitchen... Take 1 tablet by mouth once a day    Benazepril Hcl 5 Mg Tabs (Benazepril hcl) .Marland Kitchen... Take 1/2 tablet by mouth once a day  Problem # 2:  HYPERTENSION, UNSPECIFIED (ICD-401.9)  Blood pressure well controlled today.  His updated medication list for this problem includes:    Aspir-low 81 Mg Tbec (Aspirin) .Marland Kitchen... Take 1 tablet by mouth once a day    Benazepril Hcl 5 Mg Tabs (Benazepril hcl) .Marland Kitchen... Take 1/2 tablet by mouth once a day    Doxazosin Mesylate 4 Mg Tabs (Doxazosin mesylate) .Marland Kitchen... Take 1 tablet by mouth once a day    Hydrochlorothiazide 25 Mg Tabs (Hydrochlorothiazide) .Marland Kitchen... Take 1 tablet by mouth once a day  Problem # 3:  HYPERLIPIDEMIA-MIXED (ICD-272.4)  Followed by Dr. Cleta Alberts. Will review labs when available.  His updated medication list for this problem includes:    Pravastatin Sodium 20 Mg Tabs (Pravastatin sodium) .Marland Kitchen... Take 1 tablet by mouth once a day  Patient Instructions: 1)  Your physician wants you to follow-up in: 6 months. You will receive a reminder letter in the mail one-two months in advance. If you don't receive a letter, please call our office to schedule the follow-up appointment. 2)  Your physician recommends that you continue on your current medications as directed. Please refer to the Current Medication list given to you today.

## 2011-01-14 NOTE — Letter (Signed)
Summary: Diabetic Instructions  [facility]  [address] [place], [place] [address]   Phone: [phone]  Fax: [phone]    RIGGINS CISEK [birth year] MRN: [id number]   (METFORMIN)  ORAL DIABETIC MEDICATION INSTRUCTIONS  DO NOT TAKE JANUVIA DAY BEFORE PROCEDURE OR DAY OF PROCEDURE.  The day before your procedure:   Take your diabetic pill as you do normally  The day of your procedure:   Do not take your diabetic pill    We will check your blood sugar levels during the admission process and again in Recovery before discharging you home  ________________________________________________________________________

## 2011-01-14 NOTE — Progress Notes (Signed)
  Phone Note Call from Patient   Caller: Spouse Summary of Call: he had colonoscopy earlier today (WBC was 1.8, 33% neuts).  ANC approx 4-500.  I spoke to Dr. Arline Asp prior to the colonoscopy about his low WBC (Last week ANC was 200) and he thought it was safe to proceed with endoscopic procedures since he is known to have a normal bone marrow and WBC is chronically low.  We agreed to be safe that he would be given abx (unasyn 3gm during procedure, then cipro 500bid for three days).    Wife called, pt has shaking chills and temp of 99.9 (she says this is high for him).  Otherwise he feels fine, no abd pains, no bleeding, he ate a regular meal already with only some mild nausea. She explained that he has shaking chills/fever occasionally and that it has been worked up with multiple tests without clear explanation.  Last episode was in the spring, he was admitted, tests run, no clear cause.   I recommended she take him to ER but she wanted to give him another 1/2 to hour or so and if he is not feeling better she would take him in.  I will forward this to Drs. Jarold Motto, Murinson Initial call taken by: Rachael Fee MD,  September 03, 2010 6:33 PM

## 2011-01-14 NOTE — Consult Note (Signed)
Summary: [facility]   Imported By: Sherian Rein 10/07/2010 09:28:58  _____________________________________________________________________  External Attachment:    Type:   Image     Comment:   External Document

## 2011-01-14 NOTE — Letter (Signed)
Summary: [facility]  [facility]   Imported By: Lester [place] 11/25/2010 07:18:04  _____________________________________________________________________  External Attachment:    Type:   Image     Comment:   External Document  Appended Document: [facility] copy Dr. Christella Hartigan.Marland KitchenMarland Kitchen

## 2011-01-14 NOTE — Procedures (Signed)
Summary: CAPSULE ENDOSCOPY REPORT   ORDERED BY: Yancey Flemings, MD READ BY: Yancey Flemings. MD  REASON FOR REFERRAL: 77 YO MALE HOSPITALIZED FOR LOW ANTERIOR RESECTION OF A RECTAL CANCER. SURGERY 12/23/10. PT WITH MELENA FROM DIVERTING LOOP ILEOSTOMY. NEGATIVE EGD.  PATIENT DATAl Height: 54.0 inches. Weight: 189lbs. Waist: 0.0 inches. Build: Normal. Gastric Passage Time: 3h 70m.  PROCEDURE INFO & FINDINGS: 1)COMPLETE EXAM, GOOD PREP 2)TWO AREAS OF VERY MILD INFLAMMATORY CHANGE AT 3HRS 27 MIN., AND 5 HRS 53 MIN. 3) NO ACTIVE BLEEDING OR MELENA IN BOWEL.  SUMMARY & RECOMMENDATIONS: NO FURTHER WORKUP  AT THIS TIME. FOLLOW UP PRN

## 2011-01-14 NOTE — Procedures (Signed)
Summary: Colonoscopy  Patient: [name] Note: All result statuses are Final unless otherwise noted.  Tests: (1) Colonoscopy (COL)   COL Colonoscopy           DONE     [facility]     [address] [place], [place]  [address]           COLONOSCOPY PROCEDURE REPORT     PATIENT:  [name], [name]  MR#:  [id number]     BIRTHDATE:  August 16, 1932, 78 yrs. old  GENDER:  male     ENDOSCOPIST:  Rachael Fee, MD     REF. BY:  Vania Rea. Jarold Motto, M.D.     PROCEDURE DATE:  09/03/2010     PROCEDURE:  Colonoscopy with biopsy and snare polypectomy     ASA CLASS:  Class III     INDICATIONS:  FOBT positive stool, anemia     MEDICATIONS:   MAC sedation, administered by CRNA     DESCRIPTION OF PROCEDURE:   After the risks benefits and     alternatives of the procedure were thoroughly explained, informed     consent was obtained.  Digital rectal exam was performed and     revealed no rectal masses.   The  endoscope was introduced through     the anus and advanced to the cecum, which was identified by both     the appendix and ileocecal valve, without limitations.  The     quality of the prep was Miralax fair.  The instrument was then     slowly withdrawn as the colon was fully examined.  Pt is     neutropenic, I spoke with Dr. Arline Asp prior to the procedure and     we decided it was safe to proceed but pt was given Unasyn 3gm IV     and will complete 3 days of cipro twice daily after procedure.     <<PROCEDUREIMAGES>>     FINDINGS:  There were 9 small sessile polyps, located throughout     colon (mainly in cecum/ascending segments). These ranged in size     from 3mm to 4mm. All were removed, 6 of them were retrieved and     sent to pathology (jar 1) (see image2). In distal sigmoid colon     (18cm from anus) there was a firm, friable 1.5-2cm lesion that     appeared malignant. This was sampled extensively with biopsy and     then [place] Ink was used to tatoo the sigmoid colon  immediately     distal to the lesion (see image4, image5, and image6).  Internal     and external hemorrhoids were found (see image7).  This was     otherwise a normal examination of the colon (see image1).     Retroflexed views in the rectum revealed no abnormalities.     COMPLICATIONS:  None     ENDOSCOPIC IMPRESSION:     1) Nine subcentimeter polyps, located throughout colon. All     removed and most were retrieved and sent to patholoyg     2) Friable, firm 1.5-2cm lesion in sigmoid colon (18cm from     anus). This appeared malignant, was biopsied extenstively.  This     is almost certainly the source of his FOBT positive anemia and so     EGD was canceled. The site was tatoo'd with [place] Ink following     biopsies.  3) Internal and external hemorrhoids     4) Otherwise normal examination           RECOMMENDATIONS:     Await final biopsies from polyps and from sigmoid lesion.     If cancer is shown on biopsy, he will be sent for staging tests     and surgical opinion and he will need repeat colonoscopy 1 year     following treatment. This could be done in [facility] (he sedated well     with very minimal propofol).     ______________________________     Rachael Fee, MD     cc: [name], MD           n.     Rosalie Doctor:   Rachael Fee at 09/03/2010 12:02 PM           Milana Huntsman, [phone]  Note: An exclamation mark (!) indicates a result that was not dispersed into the flowsheet. Document Creation Date: 09/03/2010 12:05 PM _______________________________________________________________________  (1) Order result status: Final Collection or observation date-time: 09/03/2010 11:43 Requested date-time:  Receipt date-time:  Reported date-time:  Referring Physician:   Ordering Physician: [name] [id number]) Specimen Source:  Source: [name] Order Number: [id number] Lab site:

## 2011-01-14 NOTE — Letter (Signed)
Summary: General Surgery Clinic Note[facility]  General [facility]   Imported By: Sherian Rein 10/26/2010 10:17:08  _____________________________________________________________________  External Attachment:    Type:   Image     Comment:   External Document

## 2011-01-14 NOTE — Letter (Signed)
Summary: Diabetic Instructions  [facility]  [address] [place], [place] [address]   Phone: [phone]  Fax: [phone]    [name] 01/05/1932 MRN: [id number]     ________________________________________________________________________  X  INSULIN (LONG ACTING) MEDICATION INSTRUCTIONS (Lantus, NPH, 70/30, Humulin, Novolin-N)   The day before your procedure:   Take  your regular evening dose    The day of your procedure:   Do not take your morning dose    X   INSULIN (SHORT ACTING) MEDICATION INSTRUCTIONS (Regular, Humulog, Novolog)   The day before your procedure:   Do not take your evening dose   The day of your procedure:   Do not take your morning dose

## 2011-01-14 NOTE — Procedures (Addendum)
Summary: Upper Endoscopy  Patient: [name] Note: All result statuses are Final unless otherwise noted.  Tests: (1) Upper Endoscopy (EGD)   EGD Upper Endoscopy       DONE      [facility]     [address]     [place], [place]  [address]           ENDOSCOPY PROCEDURE REPORT           PATIENT:  [name], [name]  MR#:  [id number]     BIRTHDATE:  05-Aug-1932, 78 yrs. old  GENDER:  male           ENDOSCOPIST:  Wilhemina Bonito. Eda Keys, MD     Referred by:  Harriette Bouillon, M.D.           PROCEDURE DATE:  12/30/2010     PROCEDURE:  EGD, diagnostic 43235     ASA CLASS:  Class II     INDICATIONS:  bleeding ;reports of dark stool and blood in ostomy                 MEDICATIONS:   Fentanyl 50 mcg IV, Versed 5 mg IV     TOPICAL ANESTHETIC:  Cetacaine Spray           DESCRIPTION OF PROCEDURE:   After the risks benefits and     alternatives of the procedure were thoroughly explained, informed     consent was obtained.  The Pentax Gastroscope [id number] endoscope     was introduced through the mouth and advanced to the third portion     of the duodenum, without limitations.  The instrument was slowly     withdrawn as the mucosa was fully examined.     <<PROCEDUREIMAGES>>           The upper, middle, and distal third of the esophagus were     carefully inspected and no abnormalities were noted. The z-line     was well seen at the GEJ. The endoscope was pushed into the fundus     which was normal including a retroflexed view. The antrum,gastric     body, first and second  and third part of the duodenum were     unremarkable except for atrophic mucosa ans mild antral erythema.     No active bleeding or blood seen.    Retroflexed views revealed a     hiatal hernia.    The scope was then withdrawn from the patient     and the procedure completed.           COMPLICATIONS:  None           ENDOSCOPIC IMPRESSION:     1) Normal EGD to D3     2) No active bleeding or blood seen   3) Bile only in ostomy at this time           RECOMMENDATIONS:     1) capsule endoscopy to be arranged by GI     2) Monitor for rebleed           _____________________________     Wilhemina Bonito. Eda Keys, MD           CC:  Harriette Bouillon, M.D.     [name], M.D.     [name], M.D.,  The Patient           n.     eSIGNED:   Wilhemina Bonito. Eda Keys at 12/30/2010  11:48 AM           Milana Huntsman, [id number]  Note: An exclamation mark (!) indicates a result that was not dispersed into the flowsheet. Document Creation Date: 12/30/2010 11:49 AM _______________________________________________________________________  (1) Order result status: Final Collection or observation date-time: 12/30/2010 11:39 Requested date-time:  Receipt date-time:  Reported date-time:  Referring Physician:   Ordering Physician: [name] [phone]) Specimen Source:  Source: [name] Order Number: [id number] Lab site:   Appended Document: Upper Endoscopy This is Dr. Norval Gable patient

## 2011-01-14 NOTE — Miscellaneous (Signed)
Summary: [facility]  Clinical Lists Changes  Medications: Added new medication of MOVIPREP 100 GM  SOLR (PEG-KCL-NACL-NASULF-NA ASC-C) As per prep instructions. - Signed Rx of MOVIPREP 100 GM  SOLR (PEG-KCL-NACL-NASULF-NA ASC-C) As per prep instructions.;  #1 x 0;  Signed;  Entered by: Ezra Sites RN;  Authorized by: Mardella Layman MD [facility];  Method used: Electronically to [facility] [address] [place], [place], [place]  [address], Ph: [phone], Fax: [phone] Observations: Added new observation of NKA: T (08/07/2010 12:33)    Prescriptions: MOVIPREP 100 GM  SOLR (PEG-KCL-NACL-NASULF-NA ASC-C) As per prep instructions.  #1 x 0   Entered by:   Ezra Sites RN   Authorized by:   Mardella Layman MD [facility]   Signed by:   Ezra Sites RN on 08/07/2010   Method used:   Electronically to        [facility]* (retail)       [address] [place], [place]  [address]       Ph: [phone]       Fax: [phone]   RxID:   [id number]

## 2011-01-14 NOTE — Assessment & Plan Note (Signed)
Summary: 6 mon ful fholt   Visit Type:  Follow-up Referring Provider:  Dr. [name], Dr. [name] Primary Provider:  Dr. Elgie Congo    History of Present Illness: 77 year old male presents for followup. I saw him back in April for routine followup. In the interim he has maintained followup with Gastroenterology and also Hematology. Anemia workup was pursued, and my understanding is that the patient recently had a colonoscopy with abnormal findings including polyps and a small mass which was biopsied. Results are pending.  From a cardiac perspective he denies any active angina, palpitations, or increase in shortness of breath over NYHA class II. He remains fatigued. He is less active. Not playing golf regularly.  Prior ischemic workup is reviewed. We have been managing him medically with a history of probable underlying CAD based on previous mildly Cardiolite from 2006 indicating possible inferior ischemia. He has not wanted to pursue aggressive workup of this, particularly in light of lack of any progressive symptoms.  Clinical Review Panels:  Echocardiogram Echocardiogram  SUMMARY   -  Overall left ventricular systolic function was normal. Left         ventricular ejection fraction was estimated , range being 55         % to 60 %. This study was inadequate for the evaluation of         left ventricular regional wall motion.   -  There was mild aortic valvular regurgitation.   -  There was mild aortic root dilatation.   -  Left atrial size was at the upper limits of normal. (02/20/2009)    Current Medications (verified): 1)  Aggrenox 25-200 Mg Xr12h-Cap (Aspirin-Dipyridamole) .... Take 1 Tablet By Mouth Two Times A Day 2)  Aspir-Low 81 Mg Tbec (Aspirin) .... Take 1 Tablet By Mouth Once A Day 3)  Benazepril Hcl 5 Mg Tabs (Benazepril Hcl) .... Take 1/2 Tablet By Mouth Once A Day 4)  Doxazosin Mesylate 4 Mg Tabs (Doxazosin Mesylate) .... Take 1 Tablet By Mouth Once A  Day 5)  Finasteride 5 Mg Tabs (Finasteride) .... Take 1 Tablet By Mouth Once A Day 6)  Hydrochlorothiazide 25 Mg Tabs (Hydrochlorothiazide) .... Take 1 Tablet By Mouth Once A Day 7)  Januvia 100 Mg Tabs (Sitagliptin Phosphate) .... Take 1 Tablet By Mouth Once A Day 8)  Metformin Hcl 500 Mg Tabs (Metformin Hcl) .... Take 1 Tablet By Mouth in Morning and 2 At Night 9)  Pravastatin Sodium 20 Mg Tabs (Pravastatin Sodium) .... Take 1 Tablet By Mouth Once A Day 10)  Prednisone 20 Mg Tabs (Prednisone) .... One -Half Tablet By Mouth Every Other Day 11)  Ciprofloxacin Hcl 500 Mg  Tabs (Ciprofloxacin Hcl) .... Take 1 Twice A Day Times For 3 Days 12)  Humulin R 100 Unit/ml Soln (Insulin Regular Human) .... Ss  Allergies (verified): No Known Drug Allergies  Comments:  Nurse/Medical Assistant: The patient's medication list and allergies were reviewed with the patient and were updated in the Medication and Allergy Lists.  Past History:  Social History: Last updated: 07/09/2010 Retired  Married  Childern Tobacco Use - No Alcohol Use - no Drug Use - no Daily Caffeine Use: 2 daily   Past Medical History: Mild aortic root dilatation Type 2 diabetes mellitus History of stroke Hyperlipidemia Hypertension CAD - based on cardiolite 2006 (inferior ischemia), LVEF 60% - conservative followup Leukopenia - Dr. Arline Asp  Review of Systems  The patient denies anorexia, fever, chest pain, syncope, peripheral  edema, prolonged cough, melena, and hematochezia.         Otherwise reviewed and negative except as outlined.  Vital Signs:  Patient profile:   77 year old male Height:      75 inches Weight:      202 pounds Pulse rate:   80 / minute BP sitting:   116 / 71  (left arm) Cuff size:   large  Vitals Entered By: Carlye Grippe (September 07, 2010 10:25 AM)  Physical Exam  Additional Exam:  Overweight male in no acute distress. HEENT: Conjunctiva and lids normal, oropharynx with moist  mucosa. Neck: Supple, no elevated venous pressure. Lungs: Diminished breath sounds, and nonlabored. Cardiac: Regular rate and rhythm, no significant systolic murmur or S3. Extremities: No pitting edema. Skin: Warm and dry, no rashes.   EKG  Procedure date:  09/07/2010  Findings:      Sinus rhythm with occasional PACs and single PVC. No acute ST segment changes. Borderline LVH.  Impression & Recommendations:  Problem # 1:  CAD, NATIVE VESSEL (ICD-414.01)  Symptomatically stable on medical therapy over the years. Diagnosis based on mildly abnormal myocardial perfusion study from 2006, with no progressive symptoms, and preference for conservative management. Had evidence of possible inferior ischemia at that time. Followup electrocardiogram is stable. At this point I do not plan any followup ischemic testing, unless he needs to undergo further invasive management of his recent abnormal findings on colonoscopy (ie: surgery). In that case, would anticipate an adenosine Myoview on medical therapy, to exclude any major progression in ischemia. He will let us know if surgery is anticipated. Otherwise followup will be scheduled in 6 months. Due to travel concerns, he prefers to follow up in our [place] office, much closer to his home. We will schedule this visit with Dr. Jens Som. I can certainly see him back in the interim if the situation arises.  His updated medication list for this problem includes:    Aggrenox 25-200 Mg Xr12h-cap (Aspirin-dipyridamole) .Marland Kitchen... Take 1 tablet by mouth two times a day    Aspir-low 81 Mg Tbec (Aspirin) .Marland Kitchen... Take 1 tablet by mouth once a day    Benazepril Hcl 5 Mg Tabs (Benazepril hcl) .Marland Kitchen... Take 1/2 tablet by mouth once a day  Orders: EKG w/ Interpretation (93000)  Problem # 2:  DIABETES MELLITUS-TYPE II (ICD-250.00)  Followed by Dr. Cleta Alberts.  His updated medication list for this problem includes:    Aspir-low 81 Mg Tbec (Aspirin) .Marland Kitchen... Take 1 tablet by  mouth once a day    Benazepril Hcl 5 Mg Tabs (Benazepril hcl) .Marland Kitchen... Take 1/2 tablet by mouth once a day    Januvia 100 Mg Tabs (Sitagliptin phosphate) .Marland Kitchen... Take 1 tablet by mouth once a day    Metformin Hcl 500 Mg Tabs (Metformin hcl) .Marland Kitchen... Take 1 tablet by mouth in morning and 2 at night    Humulin R 100 Unit/ml Soln (Insulin regular human) ..... Ss  Problem # 3:  HYPERLIPIDEMIA-MIXED (ICD-272.4)  Followed by Dr. Cleta Alberts.  His updated medication list for this problem includes:    Pravastatin Sodium 20 Mg Tabs (Pravastatin sodium) .Marland Kitchen... Take 1 tablet by mouth once a day  Problem # 4:  HYPERTENSION, UNSPECIFIED (ICD-401.9)  Blood pressure control looks good today. No medication changes were made.  His updated medication list for this problem includes:    Aspir-low 81 Mg Tbec (Aspirin) .Marland Kitchen... Take 1 tablet by mouth once a day    Benazepril Hcl 5 Mg Tabs (  Benazepril hcl) .Marland Kitchen... Take 1/2 tablet by mouth once a day    Doxazosin Mesylate 4 Mg Tabs (Doxazosin mesylate) .Marland Kitchen... Take 1 tablet by mouth once a day    Hydrochlorothiazide 25 Mg Tabs (Hydrochlorothiazide) .Marland Kitchen... Take 1 tablet by mouth once a day  Problem # 5:  ANEMIA, HX OF (ICD-V12.3)  Followed by both Hematology and Gastroenterology, status post recent colonoscopy by Dr. Christella Hartigan.  Patient Instructions: 1)  Your physician wants you to follow-up in: 6 months. You will receive a reminder letter in the mail one-two months in advance. If you don't receive a letter, please call our office to schedule the follow-up appointment. We will have you follow up with Dr. Jens Som in [place]. 2)  Your physician recommends that you continue on your current medications as directed. Please refer to the Current Medication list given to you today.

## 2011-01-14 NOTE — Letter (Signed)
Summary: [facility] Instructions  [facility]  [address] [place], [place] [address]   Phone: [phone]  Fax: [phone]       [name]    1932-11-25    MRN: [id number]        Procedure Day /Date:09/03/10 THURS     Arrival Time:9 am      Procedure Time:11 am     Location of Procedure:                     X  [facility] ( Outpatient Registration)   PREPARATION FOR COLONOSCOPY WITH MOVIPREP   Starting 5 days prior to your procedure 08/28/10 do not eat nuts, seeds, popcorn, corn, beans, peas,  salads, or any raw vegetables.  Do not take any fiber supplements (e.g. Metamucil, Citrucel, and Benefiber).  THE DAY BEFORE YOUR PROCEDURE         DATE: 09/02/10  DAY: WED  1.  Drink clear liquids the entire day-NO SOLID FOOD  2.  Do not drink anything colored red or purple.  Avoid juices with pulp.  No orange juice.  3.  Drink at least 64 oz. (8 glasses) of fluid/clear liquids during the day to prevent dehydration and help the prep work efficiently.  CLEAR LIQUIDS INCLUDE: Water Jello Ice Popsicles Tea (sugar ok, no milk/cream) Powdered fruit flavored drinks Coffee (sugar ok, no milk/cream) Gatorade Juice: apple, white grape, white cranberry  Lemonade Clear bullion, consomm, broth Carbonated beverages (any kind) Strained chicken noodle soup Hard Candy                             4.  In the morning, mix first dose of MoviPrep solution:    Empty 1 Pouch A and 1 Pouch B into the disposable container    Add lukewarm drinking water to the top line of the container. Mix to dissolve    Refrigerate (mixed solution should be used within 24 hrs)  5.  Begin drinking the prep at 5:00 p.m. The MoviPrep container is divided by 4 marks.   Every 15 minutes drink the solution down to the next mark (approximately 8 oz) until the full liter is complete.   6.  Follow completed prep with 16 oz of clear liquid of your choice (Nothing red or purple).  Continue  to drink clear liquids until bedtime.  7.  Before going to bed, mix second dose of MoviPrep solution:    Empty 1 Pouch A and 1 Pouch B into the disposable container    Add lukewarm drinking water to the top line of the container. Mix to dissolve    Refrigerate  THE DAY OF YOUR PROCEDURE      DATE:09/03/10  DAY: THURS  Beginning at 6 a.m. (5 hours before procedure):         1. Every 15 minutes, drink the solution down to the next mark (approx 8 oz) until the full liter is complete.  Nothing to eat or drink after midnight except the prep solution   MEDICATION INSTRUCTIONS  Unless otherwise instructed, you should take regular prescription medications with a small sip of water   as early as possible the morning of your procedure.  Diabetic patients - see separate instructions.  Stop taking Plavix or Aggrenox on  _  _  (7 days before procedure).     Stop taking Coumadin on  _ _  (5 days before  procedure).  Additional medication instructions: _         OTHER INSTRUCTIONS  You will need a responsible adult at least 75 years of age to accompany you and drive you home.   This person must remain in the waiting room during your procedure.  Wear loose fitting clothing that is easily removed.  Leave jewelry and other valuables at home.  However, you may wish to bring a book to read or  an iPod/MP3 player to listen to music as you wait for your procedure to start.  Remove all body piercing jewelry and leave at home.  Total time from sign-in until discharge is approximately 2-3 hours.  You should go home directly after your procedure and rest.  You can resume normal activities the  day after your procedure.  The day of your procedure you should not:   Drive   Make legal decisions   Operate machinery   Drink alcohol   Return to work  You will receive specific instructions about eating, activities and medications before you leave.    The above instructions have been  reviewed and explained to me by   Chales Abrahams CMA [facility])  August 24, 2010 12:27 PM     I fully understand and can verbalize these instructions over the phone mailed to home Date 08/24/10

## 2011-01-14 NOTE — Progress Notes (Signed)
Summary: fyi  Phone Note Call from Patient Call back at Home Phone [phone]   Caller: Spouse Call For: Dr Jacobs///Dr Marina Goodell Summary of Call: Patient was told to call us  to let us kow when the camera came out, patient had a cap endo yesterday in the hosp. Initial call taken by: Tawni Levy,  January 01, 2011 10:08 AM  Follow-up for Phone Call        spoke with the patient's wife.  Small about of rectal bleeding last night.  Otherwise doing fine. Follow-up by: Darcey Nora RN, CGRN,  January 01, 2011 11:50 AM  Additional Follow-up for Phone Call Additional follow up Details #1::        Thank you.Marland Kitchen  Please tell them that the capsule endoscopy was unremarkable. No further GI evaluation at this time. If any more clots at stoma, see their surgeon. Additional Follow-up by: Hilarie Fredrickson MD,  January 01, 2011 3:03 PM    Additional Follow-up for Phone Call Additional follow up Details #2::    voicemail not available.  Will continue to try and reach the patient  Darcey Nora RN, [facility]  January 01, 2011 3:16 PM  patient's wife advised Follow-up by: Darcey Nora RN, CGRN,  January 01, 2011 3:21 PM

## 2011-01-14 NOTE — Letter (Signed)
Summary: [facility] Instructions  [facility]  [address] [place], [place] [address]   Phone: [phone]  Fax: [phone]       RUSHI CHASEN    75-13-1933    MRN: [id number]        Procedure Day Dorna Bloom: [facility]  08/19/10     Arrival Time:  9:00am (Pt to receive IV antibiotics)     Procedure Time: 11:00am     Location of Procedure:                    _X _  [facility] (4th Floor) _                       PREPARATION FOR COLONOSCOPY WITH MOVIPREP   Starting 5 days prior to your procedure  FRIDAY 09/02  do not eat nuts, seeds, popcorn, corn, beans, peas,  salads, or any raw vegetables.  Do not take any fiber supplements (e.g. Metamucil, Citrucel, and Benefiber).  THE DAY BEFORE YOUR PROCEDURE         DATE:  TUESDAY  09/06  1.  Drink clear liquids the entire day-NO SOLID FOOD  2.  Do not drink anything colored red or purple.  Avoid juices with pulp.  No orange juice.  3.  Drink at least 64 oz. (8 glasses) of fluid/clear liquids during the day to prevent dehydration and help the prep work efficiently.  CLEAR LIQUIDS INCLUDE: Water Jello Ice Popsicles Tea (sugar ok, no milk/cream) Powdered fruit flavored drinks Coffee (sugar ok, no milk/cream) Gatorade Juice: apple, white grape, white cranberry  Lemonade Clear bullion, consomm, broth Carbonated beverages (any kind) Strained chicken noodle soup Hard Candy                             4.  In the morning, mix first dose of MoviPrep solution:    Empty 1 Pouch A and 1 Pouch B into the disposable container    Add lukewarm drinking water to the top line of the container. Mix to dissolve    Refrigerate (mixed solution should be used within 24 hrs)  5.  Begin drinking the prep at 5:00 p.m. The MoviPrep container is divided by 4 marks.   Every 15 minutes drink the solution down to the next mark (approximately 8 oz) until the full liter is complete.   6.  Follow completed prep with 16 oz of  clear liquid of your choice (Nothing red or purple).  Continue to drink clear liquids until bedtime.  7.  Before going to bed, mix second dose of MoviPrep solution:    Empty 1 Pouch A and 1 Pouch B into the disposable container    Add lukewarm drinking water to the top line of the container. Mix to dissolve    Refrigerate  THE DAY OF YOUR PROCEDURE      DATE:  [facility]  09/07  Beginning at  6:00a.m. (5 hours before procedure):         1. Every 15 minutes, drink the solution down to the next mark (approx 8 oz) until the full liter is complete.  2. Follow completed prep with 16 oz. of clear liquid of your choice.    3. You may drink clear liquids until 9:00am  (2 HOURS BEFORE PROCEDURE).   MEDICATION INSTRUCTIONS  Unless otherwise instructed, you should take regular prescription medications with a small sip of water  as early as possible the morning of your procedure.  Diabetic patients - see separate instructions.   Additional medication instructions: Do not take HCTZ day of procedure.         OTHER INSTRUCTIONS  You will need a responsible adult at least 75 years of age to accompany you and drive you home.   This person must remain in the waiting room during your procedure.  Wear loose fitting clothing that is easily removed.  Leave jewelry and other valuables at home.  However, you may wish to bring a book to read or  an iPod/MP3 player to listen to music as you wait for your procedure to start.  Remove all body piercing jewelry and leave at home.  Total time from sign-in until discharge is approximately 2-3 hours.  You should go home directly after your procedure and rest.  You can resume normal activities the  day after your procedure.  The day of your procedure you should not:   Drive   Make legal decisions   Operate machinery   Drink alcohol   Return to work  You will receive specific instructions about eating, activities and medications before  you leave.    The above instructions have been reviewed and explained to me by  Ezra Sites RN  August 07, 2010 1:09 PM     I fully understand and can verbalize these instructions _____________________________ Date _________

## 2011-01-14 NOTE — Letter (Signed)
Summary: New Patient letter  [facility]  [address] [place], [place] [address]   Phone: [phone]  Fax: [phone]       07/03/2010 MRN: [id number]  [name] [address] [place], [place]  [address]  Dear Mr. Philbin,  Welcome to the Gastroenterology Division at [facility]    You are scheduled to see Dr.   Jarold Motto on 07-09-10 at 9:30am on the 3rd floor at [facility], [address]  We ask that you try to arrive at our office 15 minutes prior to your appointment time to allow for check-in.  We would like you to complete the enclosed self-administered evaluation form prior to your visit and bring it with you on the day of your appointment.  We will review it with you.  Also, please bring a complete list of all your medications or, if you prefer, bring the medication bottles and we will list them.  Please bring your insurance card so that we may make a copy of it.  If your insurance requires a referral to see a specialist, please bring your referral form from your primary care physician.  Co-payments are due at the time of your visit and may be paid by cash, check or credit card.     Your office visit will consist of a consult with your physician (includes a physical exam), any laboratory testing he/she may order, scheduling of any necessary diagnostic testing (e.g. x-ray, ultrasound, CT-scan), and scheduling of a procedure (e.g. Endoscopy, Colonoscopy) if required.  Please allow enough time on your schedule to allow for any/all of these possibilities.    If you cannot keep your appointment, please call [phone] to cancel or reschedule prior to your appointment date.  This allows [place] the opportunity to schedule an appointment for another patient in need of care.  If you do not cancel or reschedule by 5 p.m. the business day prior to your appointment date, you will be charged a $50.00 late cancellation/no-show fee.    Thank you for choosing  [facility] for your medical needs.  We appreciate the opportunity to care for you.  Please visit [place] at our website  to learn more about our practice.                     Sincerely,                                                             The Gastroenterology Division

## 2011-01-14 NOTE — Progress Notes (Signed)
Summary: Endo Colon  Phone Note Other Incoming   Caller: Dixie Doss RN Summary of Call: Quincy Carnes RN came to me to get a [name] pt scheduled for Endo Colon [facility] with Propofol for anemia, pt has complex diagnosis and needs to be done at the hospital.  He was originally scheudled for 08/19/10 in the [facility] and Dixie will cx this appt.  I tried to reach the pt wife to go over the instructions and date and time no answer on the home phone.  I will try later. Initial call taken by: Chales Abrahams CMA [facility] Dull),  August 24, 2010 10:59 AM  Follow-up for Phone Call        spoke with the pt he is awre of the ENDO COLON and will call if any further questions.  ENDO COLON with Dr Eloise Harman was cx  movi prep previously sent Follow-up by: Chales Abrahams CMA [facility] Dull),  August 24, 2010 12:24 PM

## 2011-01-14 NOTE — Letter (Signed)
Summary: [facility]  [facility]   Imported By: Lester Frierson 08/27/2010 09:52:46  _____________________________________________________________________  External Attachment:    Type:   Image     Comment:   External Document

## 2011-01-14 NOTE — Assessment & Plan Note (Signed)
Summary: SCREEN FOR COLON/HEM POSITIVE STOOLS/YF   History of Present Illness Visit Type: consult  Primary GI MD: Sheryn Bison MD FACP FAGA Primary Provider: Elgie Congo, MD  Requesting Provider: [name], MD  Chief Complaint: Consult colon. Heme positive stools History of Present Illness:   extremely complex 77 year old white male referred for evaluation of guaiac positive stools. Records are reviewed in detail today but are still somewhat incomplete.  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 Apparently he has been severely and deficient has had transfusions and IV iron infusions. His exact hematologic diagnosis is unclear, and is followed by Dr. Arline Asp at the oncology center. He apparently is on large dose prednisone for reasons which are unclear. Associated with this has been worsening of his diabetes and hypertension.  The patient to me denies any GI complaints. He says he has regular bowel movements without melena or hematochezia, and specifically denies dyspepsia, reflux symptoms, or any history of peptic ulcer disease, pancreatitis or hepatitis. He is currently on aspirin and Aggrenox because of a previous CVA, hypertension, coronary artery disease with angina, and apparently has had a previous hip replacement.  The patient never had barium studies or endoscopic exams. Recent labs reviewed from June show a white cell count of 1000, hematocrit 27, and platelet count of 325,000. I do have an ultrasound report from May this showed a normal size spleen. The patient been diabetic for several years but is not on insulin. He denies complications from his diabetes in terms of kidney or neurologic problems or eye problems.   GI Review of Systems      Denies abdominal pain, acid reflux, belching, bloating, chest pain,  dysphagia with liquids, dysphagia with solids, heartburn, loss of appetite, nausea, vomiting, vomiting blood, weight loss, and  weight gain.      Reports heme positive stool.     Denies anal fissure, black tarry stools, change in bowel habit, constipation, diarrhea, diverticulosis, fecal incontinence, hemorrhoids, irritable bowel syndrome, jaundice, light color stool, liver problems, rectal bleeding, and  rectal pain.    Current Medications (verified): 1)  Aggrenox 25-200 Mg Xr12h-Cap (Aspirin-Dipyridamole) .... Take 1 Tablet By Mouth Two Times A Day 2)  Aspir-Low 81 Mg Tbec (Aspirin) .... Take 1 Tablet By Mouth Once A Day 3)  Benazepril Hcl 5 Mg Tabs (Benazepril Hcl) .... Take 1/2 Tablet By Mouth Once A Day 4)  Doxazosin Mesylate 4 Mg Tabs (Doxazosin Mesylate) .... Take 1 Tablet By Mouth Once A Day 5)  Finasteride 5 Mg Tabs (Finasteride) .... Take 1 Tablet By Mouth Once A Day 6)  Hydrochlorothiazide 25 Mg Tabs (Hydrochlorothiazide) .... Take 1 Tablet By Mouth Once A Day 7)  Januvia 100 Mg Tabs (Sitagliptin Phosphate) .... Take 1 Tablet By Mouth Once A Day 8)  Metformin Hcl 500 Mg Tabs (Metformin Hcl) .... Take 1 Tablet By Mouth in Morning and 2 At Night 9)  Pravastatin Sodium 20 Mg Tabs (Pravastatin Sodium) .... Take 1 Tablet By Mouth Once A Day 10)  Prednisone 20 Mg Tabs (Prednisone) .... One Tablet By Mouth Once Daily in The Morning  Allergies (verified): No Known Drug Allergies  Past History:  Past medical, surgical, family and social histories (including risk factors) reviewed for relevance to current acute and chronic problems.  Past Medical History: Reviewed history from 03/16/2010 and no changes required. Mild aortic root dilatation Type  2 diabetes mellitus History of stroke Hyperlipidemia Hypertension CAD - based on cardiolite 2006 (inferior ischemia), LVEF 60% Leukopenia - Dr. Arline Asp  Past Surgical History: Hip Arthroplasty-Total Tonsillectomy  Family  History: Reviewed history from 01/16/2010 and no changes required. Alzheimers disease Parkinsons disease No FH of Colon Cancer:  Social History: Reviewed history from 01/16/2010 and no changes required. Retired  Married  Biomedical scientist Tobacco Use - No Alcohol Use - no Drug Use - no Daily Caffeine Use: 2 daily   Review of Systems General:  Complains of fatigue and weakness; denies fever, chills, sweats, anorexia, malaise, weight loss, and sleep disorder. CV:  Complains of chest pains and claudication; denies angina, palpitations, syncope, dyspnea on exertion, orthopnea, PND, and peripheral edema; apparently rather severe pain in his legs with ambulation.Marland Kitchen Resp:  Complains of dyspnea with exercise; denies dyspnea at rest, cough, sputum, wheezing, coughing up blood, and pleurisy. GI:  Denies difficulty swallowing, pain on swallowing, nausea, indigestion/heartburn, vomiting, vomiting blood, abdominal pain, jaundice, gas/bloating, diarrhea, constipation, change in bowel habits, bloody BM's, black BMs, and fecal incontinence. MS:  Complains of low back pain and muscle weakness; denies joint pain / LOM, joint swelling, joint stiffness, joint deformity, muscle cramps, muscle atrophy, leg pain at night, leg pain with exertion, and shoulder pain / LOM hand / wrist pain (CTS). Derm:  Complains of rash, itching, moles, and warts; denies dry skin, hives, and unhealing ulcers; history of multiple basal cell tumors excised and chronic eczema.. Neuro:  Complains of weakness, difficulty walking, and radiculopathy other:; denies paralysis, abnormal sensation, seizures, syncope, tremors, vertigo, transient blindness, frequent falls, frequent headaches, headache, sciatica, restless legs, memory loss, and confusion. Endo:  Denies cold intolerance, heat intolerance, polydipsia, polyphagia, polyuria, unusual weight change, and hirsutism. Heme:  Complains of bruising; denies bleeding, enlarged lymph nodes, and  pagophagia. Allergy:  Denies hives, rash, sneezing, hay fever, and recurrent infections.  Vital Signs:  Patient profile:   77 year old male Height:      75 inches Weight:      199 pounds BMI:     24.96 BSA:     2.19 Pulse rate:   88 / minute Pulse rhythm:   regular BP sitting:   126 / 74  (left arm) Cuff size:   regular  Vitals Entered By: Ok Anis CMA (July 09, 2010 9:29 AM)  Physical Exam  General:  Chronically ill-appearing white male in no acute distress appearing alert than his stated age. I cannot appreciate stigmata of chronic liver disease. Head:  Normocephalic and atraumatic. Eyes:  PERRLA, no icterus.exam deferred to patient's ophthalmologist.   Neck:  Supple; no masses or thyromegaly. Lungs:  Clear throughout to auscultation. Heart:  Regular rate and rhythm; no murmurs, rubs,  or bruits. Abdomen:  Soft, nontender and nondistended. No masses, hepatosplenomegaly or hernias noted. Normal bowel sounds. Msk:  Symmetrical with no gross deformities. Normal posture.kyphoscoliosis of the spine noted. Extremities:  No clubbing, cyanosis, edema or deformities noted.trace pedal edema.   Neurologic:  Alert and  oriented x4;  grossly normal neurologically.weakness noted.   Skin:  eczematous rash:.   Cervical Nodes:  No significant cervical adenopathy. Psych:  Alert and cooperative. Normal mood and affect.poor concentration and poor memory.     Impression & Recommendations:  Problem # 1:  ANEMIA, HX OF (ICD-V12.3) Assessment Unchanged After Reviewing His hematology night from July 21, I still cannot determine his exact hematologic diagnosis. It is unclear to me if he has ever had Hemoccult cards are  guaiac positive. He certainly is overdue for colonoscopy exam, but is a poor candidate because of his hematologic problems, diabetes, coronary artery disease, and debilitated condition. He would be extremely high risk for colonoscopy preparation and procedure and would probably need  hospitalization for diabetic control, fluid balance control, and preoperative antibiotics. His extremely low white count is very worrisome, and he apparently was just recently admitted within the last several months for unexplained sepsis. I have elected to proceed with CT colonoscopy if possible with further evaluation pending this result. Also, we will try to determine better his exact hematologic diagnosis, and some input from hematology and primary care as to whether or not this patient has an extended survival outlook.  Problem # 2:  CAD, NATIVE VESSEL (ICD-414.01) Assessment: Unchanged  Problem # 3:  DIABETES MELLITUS-TYPE II (ICD-250.00) Assessment: Deteriorated The Patient's Wife Relates His Diabetes has deteriorated over prednisone therapy. I have no records concerning his recent diabetic status. He does have peripheral vascular disease, peripheral neuropathy, neuromuscular problems, et Karie Soda.  Patient Instructions: 1)  We will call you re an appt for a virtual colonoscopy. 2)  Please continue current medications.  3)  The medication list was reviewed and reconciled.  All changed / newly prescribed medications were explained.  A complete medication list was provided to the patient / caregiver. 4)  Copy sent to : Dr. [name] and Dr. Elgie Congo. 5)  Please continue current medications.

## 2011-01-14 NOTE — Progress Notes (Signed)
Summary: Colonoscopy  Phone Note Other Incoming   Summary of Call: Dr. Jarold Motto talked with pt' cardiologist and pt can have colonoscopy.  Pt will need Unasyn 3 gm IV prior to Colon per Dr. Jarold Motto  Please schedule endo/colon.  Initial call taken by: Ashok Cordia RN,  July 13, 2010 11:04 AM  Follow-up for Phone Call        No answer.   Lupita Leash Surface RN  July 13, 2010 12:07 PM  Pt sch for previsit on 08/07/10 and endo/colon 08/19/10.  Pt aware of appts.  Dixie Doss RN notified of order for Unasyn. Follow-up by: Ashok Cordia RN,  July 13, 2010 3:23 PM

## 2011-01-14 NOTE — Letter (Signed)
Summary: [facility]  [facility]   Imported By: Sherian Rein 10/26/2010 10:19:37  _____________________________________________________________________  External Attachment:    Type:   Image     Comment:   External Document

## 2011-01-14 NOTE — Progress Notes (Signed)
Summary: Labs, [facility], [facility]  Phone Note Outgoing Call Call back at [facility] Phone [phone]   Call placed by: Chales Abrahams CMA Duncan Dull),  September 07, 2010 3:15 PM Summary of Call: pt needs to have CEA level checked as well as CT chest abd pelvis   pt spouse was notified and verbalized the instructions to have labs on Tues and pick up contrast at Dr Christella Hartigan office and have Ct on Wed.  pt notified that the records were sent to [facility] and would recieve a call with an appt with them. Initial call taken by: Chales Abrahams CMA Duncan Dull),  September 07, 2010 3:15 PM

## 2011-01-14 NOTE — Progress Notes (Signed)
Summary: Reschedule appt for 2/9  Phone Note Call from Patient Call back at Home Phone [phone]   Summary of Call: Pt's wife called stating pt has appt for 2/9 but he has been in [facility] hospital. She states he was admitted with leukopenia. He will be d/c'd today. She wants to r/s appt for 2/9 d/t pt not feeling well and leukopenia. Appt r/s to 4/4 at 10:40. She states Dr. Diona Browner can review records from [facility] and notify her if anything further needs to be done. Initial call taken by: Cyril Loosen, RN, BSN,  January 19, 2010 4:32 PM     Appended Document: Reschedule appt for 2/9 Reviewed discharge summary - no active cardiac issues described.  OK to reschedule if no active or progressive angina.

## 2011-01-25 ENCOUNTER — Other Ambulatory Visit (HOSPITAL_COMMUNITY): Payer: Self-pay | Admitting: Oncology

## 2011-01-25 ENCOUNTER — Encounter (HOSPITAL_BASED_OUTPATIENT_CLINIC_OR_DEPARTMENT_OTHER): Payer: Medicare Other | Admitting: Oncology

## 2011-01-25 ENCOUNTER — Encounter: Payer: Self-pay | Admitting: Gastroenterology

## 2011-01-25 DIAGNOSIS — D709 Neutropenia, unspecified: Secondary | ICD-10-CM

## 2011-01-25 DIAGNOSIS — C189 Malignant neoplasm of colon, unspecified: Secondary | ICD-10-CM

## 2011-01-25 LAB — CBC WITH DIFFERENTIAL/PLATELET
Eosinophils Absolute: 0.1 10*3/uL (ref 0.0–0.5)
LYMPH%: 11.8 % — ABNORMAL LOW (ref 14.0–49.0)
MONO#: 0.9 10*3/uL (ref 0.1–0.9)
NEUT#: 23.3 10*3/uL — ABNORMAL HIGH (ref 1.5–6.5)
Platelets: 222 10*3/uL (ref 140–400)
RBC: 4.13 10*6/uL — ABNORMAL LOW (ref 4.20–5.82)
WBC: 27.7 10*3/uL — ABNORMAL HIGH (ref 4.0–10.3)
lymph#: 3.3 10*3/uL (ref 0.9–3.3)

## 2011-01-25 LAB — COMPREHENSIVE METABOLIC PANEL
Albumin: 4.1 g/dL (ref 3.5–5.2)
CO2: 25 mEq/L (ref 19–32)
Calcium: 9.6 mg/dL (ref 8.4–10.5)
Chloride: 96 mEq/L (ref 96–112)
Glucose, Bld: 166 mg/dL — ABNORMAL HIGH (ref 70–99)
Potassium: 4.5 mEq/L (ref 3.5–5.3)
Sodium: 133 mEq/L — ABNORMAL LOW (ref 135–145)
Total Bilirubin: 0.4 mg/dL (ref 0.3–1.2)
Total Protein: 7.3 g/dL (ref 6.0–8.3)

## 2011-01-25 LAB — LACTATE DEHYDROGENASE: LDH: 156 U/L (ref 94–250)

## 2011-01-25 NOTE — Discharge Summary (Signed)
  [name], [name] NO.:  [id number]  MEDICAL RECORD NO.:  [id number]          PATIENT TYPE:  INP  LOCATION:  5118                         FACILITY:  [facility]  PHYSICIAN:  Maisie Fus A. Emigdio Wildeman, M.D.DATE OF BIRTH:  November 27, 1932  DATE OF ADMISSION:  12/29/2010 DATE OF DISCHARGE:  12/31/2010                              DISCHARGE SUMMARY   HISTORY OF PRESENT ILLNESS:  [name] is a pleasant 77 year old gentleman with complaint of 5 days of pain and perhaps some bleeding from his ileostomy site.  The patient had a prior low anterior with ileostomy for prior colon cancer.  He was seen in the emergency department by Dr. Carolynne Edouard.  Decision was made to admit the patient for observation.  SUMMARY OF HOSPITAL COURSE:  The patient was admitted on December 29, 2010.  Gastroenterology was asked to see the patient as well.  There appeared to be some bleeding in his bag, but this was thought to be very proximal.  There was eventually no source identified.  Gastroenterology recommended a capsule endoscopy, but the patient did not need to remain in the hospital for the results.  Therefore as of December 31, 2010, the patient was discharged.  DISCHARGE DIAGNOSIS:  Gastrointestinal bleed of uncertain etiology - stable.  PLAN:  The patient had a capsule endoscopy and progress, which will be reviewed and followed up by Gastroenterology.  The patient can contact our office with any other questions or concerns.     [name] El, PA-C   ______________________________ [name], M.D.    [name]  D:  01/18/2011  T:  01/19/2011  Job:  [id number]  Electronically Signed by [name]  on 01/20/2011 09:30:41 AM Electronically Signed by [name] M.D. on 01/25/2011 07:42:32 AM

## 2011-02-08 ENCOUNTER — Other Ambulatory Visit (HOSPITAL_COMMUNITY): Payer: Self-pay | Admitting: Oncology

## 2011-02-08 ENCOUNTER — Encounter (HOSPITAL_BASED_OUTPATIENT_CLINIC_OR_DEPARTMENT_OTHER): Payer: Medicare Other | Admitting: Oncology

## 2011-02-08 DIAGNOSIS — C189 Malignant neoplasm of colon, unspecified: Secondary | ICD-10-CM

## 2011-02-08 DIAGNOSIS — D709 Neutropenia, unspecified: Secondary | ICD-10-CM

## 2011-02-08 LAB — CBC WITH DIFFERENTIAL/PLATELET
Basophils Absolute: 0 10*3/uL (ref 0.0–0.1)
EOS%: 5.3 % (ref 0.0–7.0)
Eosinophils Absolute: 0.1 10*3/uL (ref 0.0–0.5)
HCT: 31.5 % — ABNORMAL LOW (ref 38.4–49.9)
HGB: 10.4 g/dL — ABNORMAL LOW (ref 13.0–17.1)
MCH: 26.9 pg — ABNORMAL LOW (ref 27.2–33.4)
MCV: 81.4 fL (ref 79.3–98.0)
MONO%: 20.8 % — ABNORMAL HIGH (ref 0.0–14.0)
NEUT#: 0.5 10*3/uL — ABNORMAL LOW (ref 1.5–6.5)
NEUT%: 18.5 % — ABNORMAL LOW (ref 39.0–75.0)

## 2011-02-08 LAB — BASIC METABOLIC PANEL
BUN: 18 mg/dL (ref 6–23)
Chloride: 104 mEq/L (ref 96–112)
Creatinine, Ser: 1.5 mg/dL (ref 0.40–1.50)
Glucose, Bld: 190 mg/dL — ABNORMAL HIGH (ref 70–99)
Potassium: 5 mEq/L (ref 3.5–5.3)

## 2011-02-09 NOTE — Letter (Signed)
Summary: [facility]  [facility]   Imported By: Lennie Odor 02/05/2011 15:39:40  _____________________________________________________________________  External Attachment:    Type:   Image     Comment:   External Document

## 2011-02-15 ENCOUNTER — Other Ambulatory Visit (HOSPITAL_COMMUNITY): Payer: Self-pay | Admitting: Oncology

## 2011-02-15 ENCOUNTER — Encounter (HOSPITAL_BASED_OUTPATIENT_CLINIC_OR_DEPARTMENT_OTHER): Payer: Medicare Other | Admitting: Oncology

## 2011-02-15 DIAGNOSIS — C189 Malignant neoplasm of colon, unspecified: Secondary | ICD-10-CM

## 2011-02-15 DIAGNOSIS — D709 Neutropenia, unspecified: Secondary | ICD-10-CM

## 2011-02-15 LAB — CBC WITH DIFFERENTIAL/PLATELET
BASO%: 0.1 % (ref 0.0–2.0)
Eosinophils Absolute: 0 10*3/uL (ref 0.0–0.5)
LYMPH%: 4.6 % — ABNORMAL LOW (ref 14.0–49.0)
MCHC: 33.5 g/dL (ref 32.0–36.0)
MCV: 83.4 fL (ref 79.3–98.0)
MONO%: 0.7 % (ref 0.0–14.0)
NEUT#: 47.3 10*3/uL — ABNORMAL HIGH (ref 1.5–6.5)
Platelets: 283 10*3/uL (ref 140–400)
RBC: 3.95 10*6/uL — ABNORMAL LOW (ref 4.20–5.82)
RDW: 19.1 % — ABNORMAL HIGH (ref 11.0–14.6)
WBC: 50 10*3/uL — ABNORMAL HIGH (ref 4.0–10.3)

## 2011-02-17 ENCOUNTER — Encounter (HOSPITAL_BASED_OUTPATIENT_CLINIC_OR_DEPARTMENT_OTHER): Payer: Medicare Other | Admitting: Oncology

## 2011-02-17 ENCOUNTER — Other Ambulatory Visit (HOSPITAL_COMMUNITY): Payer: Self-pay | Admitting: Oncology

## 2011-02-17 DIAGNOSIS — D709 Neutropenia, unspecified: Secondary | ICD-10-CM

## 2011-02-17 DIAGNOSIS — C189 Malignant neoplasm of colon, unspecified: Secondary | ICD-10-CM

## 2011-02-17 LAB — BASIC METABOLIC PANEL
CO2: 23 mEq/L (ref 19–32)
Calcium: 8.8 mg/dL (ref 8.4–10.5)
Chloride: 101 mEq/L (ref 96–112)
Glucose, Bld: 174 mg/dL — ABNORMAL HIGH (ref 70–99)
Potassium: 4.5 mEq/L (ref 3.5–5.3)
Sodium: 136 mEq/L (ref 135–145)

## 2011-02-17 LAB — CBC WITH DIFFERENTIAL/PLATELET
BASO%: 0.6 % (ref 0.0–2.0)
Eosinophils Absolute: 0.1 10*3/uL (ref 0.0–0.5)
MCHC: 33.6 g/dL (ref 32.0–36.0)
MONO#: 0.4 10*3/uL (ref 0.1–0.9)
NEUT#: 8.2 10*3/uL — ABNORMAL HIGH (ref 1.5–6.5)
RBC: 3.7 10*6/uL — ABNORMAL LOW (ref 4.20–5.82)
RDW: 19.3 % — ABNORMAL HIGH (ref 11.0–14.6)
WBC: 10.7 10*3/uL — ABNORMAL HIGH (ref 4.0–10.3)
lymph#: 1.9 10*3/uL (ref 0.9–3.3)

## 2011-02-18 NOTE — Letter (Addendum)
Summary: [facility]  [facility]   Imported By: Lennie Odor 02/12/2011 11:55:45  _____________________________________________________________________  External Attachment:    Type:   Image     Comment:   External Document

## 2011-02-22 ENCOUNTER — Encounter (HOSPITAL_BASED_OUTPATIENT_CLINIC_OR_DEPARTMENT_OTHER): Payer: Medicare Other | Admitting: Oncology

## 2011-02-22 ENCOUNTER — Other Ambulatory Visit (HOSPITAL_COMMUNITY): Payer: Self-pay | Admitting: Oncology

## 2011-02-22 DIAGNOSIS — D709 Neutropenia, unspecified: Secondary | ICD-10-CM

## 2011-02-22 DIAGNOSIS — C189 Malignant neoplasm of colon, unspecified: Secondary | ICD-10-CM

## 2011-02-22 LAB — BASIC METABOLIC PANEL
BUN: 30 mg/dL — ABNORMAL HIGH (ref 6–23)
CO2: 24 mEq/L (ref 19–32)
Calcium: 9 mg/dL (ref 8.4–10.5)
Glucose, Bld: 196 mg/dL — ABNORMAL HIGH (ref 70–99)
Potassium: 4.4 mEq/L (ref 3.5–5.3)
Sodium: 133 mEq/L — ABNORMAL LOW (ref 135–145)

## 2011-02-22 LAB — CBC WITH DIFFERENTIAL/PLATELET
Basophils Absolute: 0.1 10*3/uL (ref 0.0–0.1)
Eosinophils Absolute: 0.1 10*3/uL (ref 0.0–0.5)
HCT: 30.8 % — ABNORMAL LOW (ref 38.4–49.9)
HGB: 10.4 g/dL — ABNORMAL LOW (ref 13.0–17.1)
MCV: 81.1 fL (ref 79.3–98.0)
MONO%: 20.5 % — ABNORMAL HIGH (ref 0.0–14.0)
NEUT#: 0.6 10*3/uL — ABNORMAL LOW (ref 1.5–6.5)
NEUT%: 22 % — ABNORMAL LOW (ref 39.0–75.0)
Platelets: 208 10*3/uL (ref 140–400)
RDW: 16.7 % — ABNORMAL HIGH (ref 11.0–14.6)

## 2011-02-23 LAB — DIFFERENTIAL
Band Neutrophils: 0 % (ref 0–10)
Basophils Absolute: 0 10*3/uL (ref 0.0–0.1)
Basophils Absolute: 0.1 10*3/uL (ref 0.0–0.1)
Basophils Relative: 1 % (ref 0–1)
Basophils Relative: 2 % — ABNORMAL HIGH (ref 0–1)
Basophils Relative: 3 % — ABNORMAL HIGH (ref 0–1)
Basophils Relative: 3 % — ABNORMAL HIGH (ref 0–1)
Basophils Relative: 3 % — ABNORMAL HIGH (ref 0–1)
Blasts: 0 %
Eosinophils Absolute: 0 10*3/uL (ref 0.0–0.7)
Eosinophils Absolute: 0.1 10*3/uL (ref 0.0–0.7)
Eosinophils Absolute: 0.1 10*3/uL (ref 0.0–0.7)
Eosinophils Absolute: 0.1 10*3/uL (ref 0.0–0.7)
Eosinophils Absolute: 0.2 10*3/uL (ref 0.0–0.7)
Eosinophils Relative: 5 % (ref 0–5)
Lymphocytes Relative: 33 % (ref 12–46)
Lymphocytes Relative: 60 % — ABNORMAL HIGH (ref 12–46)
Lymphs Abs: 1.3 10*3/uL (ref 0.7–4.0)
Metamyelocytes Relative: 0 %
Monocytes Absolute: 0.3 10*3/uL (ref 0.1–1.0)
Monocytes Absolute: 0.3 10*3/uL (ref 0.1–1.0)
Monocytes Relative: 13 % — ABNORMAL HIGH (ref 3–12)
Monocytes Relative: 55 % — ABNORMAL HIGH (ref 3–12)
Neutro Abs: 0 10*3/uL — ABNORMAL LOW (ref 1.7–7.7)
Neutro Abs: 0 10*3/uL — ABNORMAL LOW (ref 1.7–7.7)
Neutrophils Relative %: 46 % (ref 43–77)
Neutrophils Relative %: 6 % — ABNORMAL LOW (ref 43–77)
WBC Morphology: INCREASED

## 2011-02-23 LAB — CBC
HCT: 27 % — ABNORMAL LOW (ref 39.0–52.0)
HCT: 27.7 % — ABNORMAL LOW (ref 39.0–52.0)
HCT: 28.7 % — ABNORMAL LOW (ref 39.0–52.0)
HCT: 28.9 % — ABNORMAL LOW (ref 39.0–52.0)
Hemoglobin: 9.2 g/dL — ABNORMAL LOW (ref 13.0–17.0)
Hemoglobin: 9.5 g/dL — ABNORMAL LOW (ref 13.0–17.0)
MCH: 26.3 pg (ref 26.0–34.0)
MCH: 26.4 pg (ref 26.0–34.0)
MCH: 27.2 pg (ref 26.0–34.0)
MCH: 27.4 pg (ref 26.0–34.0)
MCHC: 32.9 g/dL (ref 30.0–36.0)
MCHC: 33.1 g/dL (ref 30.0–36.0)
MCHC: 33.2 g/dL (ref 30.0–36.0)
MCV: 80.2 fL (ref 78.0–100.0)
MCV: 80.3 fL (ref 78.0–100.0)
MCV: 81.3 fL (ref 78.0–100.0)
Platelets: 236 10*3/uL (ref 150–400)
Platelets: 240 10*3/uL (ref 150–400)
Platelets: 254 10*3/uL (ref 150–400)
RBC: 3.32 MIL/uL — ABNORMAL LOW (ref 4.22–5.81)
RDW: 13.6 % (ref 11.5–15.5)
RDW: 13.9 % (ref 11.5–15.5)
WBC: 1.2 10*3/uL — CL (ref 4.0–10.5)

## 2011-02-23 LAB — BASIC METABOLIC PANEL
CO2: 27 mEq/L (ref 19–32)
CO2: 30 mEq/L (ref 19–32)
Chloride: 101 mEq/L (ref 96–112)
Chloride: 99 mEq/L (ref 96–112)
GFR calc non Af Amer: >60 mL/min (ref >60)
Glucose, Bld: 210 mg/dL — ABNORMAL HIGH (ref 70–99)
Glucose, Bld: 218 mg/dL — ABNORMAL HIGH (ref 70–99)
Potassium: 4.1 mEq/L (ref 3.5–5.1)
Potassium: 4.2 mEq/L (ref 3.5–5.1)
Sodium: 135 mEq/L (ref 135–145)
Sodium: 136 mEq/L (ref 135–145)

## 2011-02-23 LAB — GLUCOSE, CAPILLARY
Glucose-Capillary: 163 mg/dL — ABNORMAL HIGH (ref 70–99)
Glucose-Capillary: 210 mg/dL — ABNORMAL HIGH (ref 70–99)
Glucose-Capillary: 212 mg/dL — ABNORMAL HIGH (ref 70–99)
Glucose-Capillary: 214 mg/dL — ABNORMAL HIGH (ref 70–99)
Glucose-Capillary: 217 mg/dL — ABNORMAL HIGH (ref 70–99)
Glucose-Capillary: 235 mg/dL — ABNORMAL HIGH (ref 70–99)
Glucose-Capillary: 317 mg/dL — ABNORMAL HIGH (ref 70–99)

## 2011-02-23 LAB — URINALYSIS, ROUTINE W REFLEX MICROSCOPIC
Glucose, UA: NEGATIVE mg/dL
Specific Gravity, Urine: 1.014 (ref 1.005–1.030)
pH: 8.5 — ABNORMAL HIGH (ref 5.0–8.0)

## 2011-02-23 LAB — CULTURE, BLOOD (ROUTINE X 2): Culture  Setup Time: 201111131722

## 2011-02-23 LAB — LACTATE DEHYDROGENASE: LDH: 118 U/L (ref 94–250)

## 2011-02-23 LAB — IRON AND TIBC

## 2011-02-23 LAB — PROCALCITONIN: Procalcitonin: 0.67 ng/mL

## 2011-02-23 LAB — COMPREHENSIVE METABOLIC PANEL
Alkaline Phosphatase: 69 U/L (ref 39–117)
BUN: 10 mg/dL (ref 6–23)
Creatinine, Ser: 0.96 mg/dL (ref 0.4–1.5)
Glucose, Bld: 164 mg/dL — ABNORMAL HIGH (ref 70–99)
Potassium: 4.1 mEq/L (ref 3.5–5.1)
Total Bilirubin: 0.4 mg/dL (ref 0.3–1.2)
Total Protein: 6 g/dL (ref 6.0–8.3)

## 2011-02-23 LAB — URINE MICROSCOPIC-ADD ON

## 2011-02-23 LAB — URINE CULTURE
Colony Count: NO GROWTH
Culture  Setup Time: 201111131729

## 2011-02-23 LAB — HSV(HERPES SIMPLEX VRS) I + II AB-IGG: Herpes Simplex Vrs I + II Ab, IgG: 0.4 IV

## 2011-02-23 LAB — VANCOMYCIN, TROUGH: Vancomycin Tr: 16.2 ug/mL (ref 10.0–20.0)

## 2011-02-23 LAB — HIV ANTIBODY (ROUTINE TESTING W REFLEX): HIV: NONREACTIVE

## 2011-02-23 LAB — PATHOLOGIST SMEAR REVIEW

## 2011-02-23 LAB — TECHNOLOGIST SMEAR REVIEW: Path Review: ADEQUATE

## 2011-02-24 ENCOUNTER — Ambulatory Visit: Payer: MEDICARE | Admitting: Cardiology

## 2011-02-25 ENCOUNTER — Other Ambulatory Visit: Payer: Self-pay | Admitting: Surgery

## 2011-02-25 DIAGNOSIS — Z433 Encounter for attention to colostomy: Secondary | ICD-10-CM

## 2011-02-25 LAB — BASIC METABOLIC PANEL
BUN: 11 mg/dL (ref 6–23)
CO2: 31 mEq/L (ref 19–32)
Chloride: 98 mEq/L (ref 96–112)
Creatinine, Ser: 0.92 mg/dL (ref 0.4–1.5)
GFR calc Af Amer: >60 mL/min (ref >60)
Potassium: 4.1 mEq/L (ref 3.5–5.1)

## 2011-02-25 LAB — DIFFERENTIAL
Eosinophils Relative: 2 % (ref 0–5)
Lymphs Abs: 0.7 10*3/uL (ref 0.7–4.0)
Monocytes Absolute: 0.5 10*3/uL (ref 0.1–1.0)
Monocytes Relative: 27 % — ABNORMAL HIGH (ref 3–12)
Neutro Abs: 0.6 10*3/uL — ABNORMAL LOW (ref 1.7–7.7)

## 2011-02-25 LAB — CBC
HCT: 32 % — ABNORMAL LOW (ref 39.0–52.0)
MCH: 27.2 pg (ref 26.0–34.0)
MCV: 79.6 fL (ref 78.0–100.0)
Platelets: 262 10*3/uL (ref 150–400)
RBC: 4.03 MIL/uL — ABNORMAL LOW (ref 4.22–5.81)
WBC: 1.8 10*3/uL — ABNORMAL LOW (ref 4.0–10.5)

## 2011-02-28 LAB — CROSSMATCH: Antibody Screen: NEGATIVE

## 2011-03-01 ENCOUNTER — Other Ambulatory Visit (HOSPITAL_COMMUNITY): Payer: Self-pay | Admitting: Oncology

## 2011-03-01 ENCOUNTER — Encounter (HOSPITAL_BASED_OUTPATIENT_CLINIC_OR_DEPARTMENT_OTHER): Payer: Medicare Other | Admitting: Oncology

## 2011-03-01 DIAGNOSIS — D709 Neutropenia, unspecified: Secondary | ICD-10-CM

## 2011-03-01 DIAGNOSIS — C189 Malignant neoplasm of colon, unspecified: Secondary | ICD-10-CM

## 2011-03-01 LAB — CBC WITH DIFFERENTIAL/PLATELET
BASO%: 0.6 % (ref 0.0–2.0)
Basophils Absolute: 0 10*3/uL (ref 0.0–0.1)
EOS%: 1.3 % (ref 0.0–7.0)
HCT: 33.2 % — ABNORMAL LOW (ref 38.4–49.9)
HGB: 11.4 g/dL — ABNORMAL LOW (ref 13.0–17.1)
LYMPH%: 34.5 % (ref 14.0–49.0)
MCH: 28.5 pg (ref 27.2–33.4)
MCHC: 34.4 g/dL (ref 32.0–36.0)
MCV: 83 fL (ref 79.3–98.0)
NEUT%: 44.3 % (ref 39.0–75.0)
Platelets: 224 10*3/uL (ref 140–400)
lymph#: 1.5 10*3/uL (ref 0.9–3.3)

## 2011-03-01 LAB — BASIC METABOLIC PANEL
BUN: 21 mg/dL (ref 6–23)
Calcium: 9.3 mg/dL (ref 8.4–10.5)
Chloride: 101 mEq/L (ref 96–112)
Creatinine, Ser: 1.68 mg/dL — ABNORMAL HIGH (ref 0.40–1.50)

## 2011-03-02 LAB — DIFFERENTIAL
Basophils Absolute: 0 10*3/uL (ref 0.0–0.1)
Basophils Relative: 2 % — ABNORMAL HIGH (ref 0–1)
Eosinophils Absolute: 0.1 10*3/uL (ref 0.0–0.7)
Lymphocytes Relative: 54 % — ABNORMAL HIGH (ref 12–46)
Monocytes Relative: 32 % — ABNORMAL HIGH (ref 3–12)
Neutrophils Relative %: 8 % — ABNORMAL LOW (ref 43–77)

## 2011-03-02 LAB — CBC
MCHC: 32.7 g/dL (ref 30.0–36.0)
RBC: 3.51 MIL/uL — ABNORMAL LOW (ref 4.22–5.81)
RDW: 13.3 % (ref 11.5–15.5)

## 2011-03-02 LAB — GLUCOSE, CAPILLARY: Glucose-Capillary: 175 mg/dL — ABNORMAL HIGH (ref 70–99)

## 2011-03-02 LAB — BONE MARROW EXAM

## 2011-03-03 LAB — CBC
HCT: 31.1 % — ABNORMAL LOW (ref 39.0–52.0)
HCT: 31.6 % — ABNORMAL LOW (ref 39.0–52.0)
Hemoglobin: 10.6 g/dL — ABNORMAL LOW (ref 13.0–17.0)
Hemoglobin: 10.8 g/dL — ABNORMAL LOW (ref 13.0–17.0)
MCHC: 34.7 g/dL (ref 30.0–36.0)
MCV: 82.5 fL (ref 78.0–100.0)
MCV: 82.6 fL (ref 78.0–100.0)
MCV: 83.6 fL (ref 78.0–100.0)
Platelets: 252 10*3/uL (ref 150–400)
RBC: 3.77 MIL/uL — ABNORMAL LOW (ref 4.22–5.81)
RBC: 3.79 MIL/uL — ABNORMAL LOW (ref 4.22–5.81)
WBC: 2.1 10*3/uL — ABNORMAL LOW (ref 4.0–10.5)
WBC: 2.4 10*3/uL — ABNORMAL LOW (ref 4.0–10.5)

## 2011-03-03 LAB — DIFFERENTIAL
Basophils Absolute: 0 10*3/uL (ref 0.0–0.1)
Basophils Relative: 2 % — ABNORMAL HIGH (ref 0–1)
Eosinophils Absolute: 0.1 10*3/uL (ref 0.0–0.7)
Eosinophils Relative: 5 % (ref 0–5)
Lymphs Abs: 1.2 10*3/uL (ref 0.7–4.0)
Lymphs Abs: 1.6 10*3/uL (ref 0.7–4.0)
Monocytes Absolute: 0.6 10*3/uL (ref 0.1–1.0)
Monocytes Relative: 28 % — ABNORMAL HIGH (ref 3–12)
Neutro Abs: 0 10*3/uL — ABNORMAL LOW (ref 1.7–7.7)
Neutro Abs: 0.1 10*3/uL — ABNORMAL LOW (ref 1.7–7.7)

## 2011-03-03 LAB — BASIC METABOLIC PANEL
Chloride: 99 mEq/L (ref 96–112)
Creatinine, Ser: 1.03 mg/dL (ref 0.4–1.5)
GFR calc Af Amer: >60 mL/min (ref >60)
Potassium: 4.1 mEq/L (ref 3.5–5.1)
Sodium: 136 mEq/L (ref 135–145)

## 2011-03-03 LAB — GLUCOSE, CAPILLARY: Glucose-Capillary: 206 mg/dL — ABNORMAL HIGH (ref 70–99)

## 2011-03-04 ENCOUNTER — Other Ambulatory Visit: Payer: MEDICARE

## 2011-03-04 LAB — CULTURE, BLOOD (ROUTINE X 2)

## 2011-03-04 LAB — CBC
HCT: 32 % — ABNORMAL LOW (ref 39.0–52.0)
MCV: 82.7 fL (ref 78.0–100.0)
RBC: 3.87 MIL/uL — ABNORMAL LOW (ref 4.22–5.81)
WBC: 1.4 10*3/uL — CL (ref 4.0–10.5)

## 2011-03-04 LAB — LACTIC ACID, PLASMA: Lactic Acid, Venous: 1.7 mmol/L (ref 0.5–2.2)

## 2011-03-04 LAB — URINALYSIS, ROUTINE W REFLEX MICROSCOPIC
Ketones, ur: NEGATIVE mg/dL
Nitrite: NEGATIVE
Specific Gravity, Urine: 1.02 (ref 1.005–1.030)
Urobilinogen, UA: 1 mg/dL (ref 0.0–1.0)
pH: 6 (ref 5.0–8.0)

## 2011-03-04 LAB — COMPREHENSIVE METABOLIC PANEL
BUN: 19 mg/dL (ref 6–23)
CO2: 30 mEq/L (ref 19–32)
Chloride: 93 mEq/L — ABNORMAL LOW (ref 96–112)
Creatinine, Ser: 0.9 mg/dL (ref 0.4–1.5)
GFR calc non Af Amer: >60 mL/min (ref >60)
Glucose, Bld: 150 mg/dL — ABNORMAL HIGH (ref 70–99)
Total Bilirubin: 0.4 mg/dL (ref 0.3–1.2)

## 2011-03-04 LAB — DIFFERENTIAL
Basophils Absolute: 0 10*3/uL (ref 0.0–0.1)
Lymphocytes Relative: 49 % — ABNORMAL HIGH (ref 12–46)
Lymphs Abs: 0.7 10*3/uL (ref 0.7–4.0)
Monocytes Relative: 37 % — ABNORMAL HIGH (ref 3–12)

## 2011-03-04 LAB — URINE CULTURE: Colony Count: NO GROWTH

## 2011-03-08 ENCOUNTER — Other Ambulatory Visit (HOSPITAL_COMMUNITY): Payer: Self-pay | Admitting: Oncology

## 2011-03-08 ENCOUNTER — Encounter (HOSPITAL_BASED_OUTPATIENT_CLINIC_OR_DEPARTMENT_OTHER): Payer: Medicare Other | Admitting: Oncology

## 2011-03-08 DIAGNOSIS — C189 Malignant neoplasm of colon, unspecified: Secondary | ICD-10-CM

## 2011-03-08 DIAGNOSIS — D709 Neutropenia, unspecified: Secondary | ICD-10-CM

## 2011-03-08 LAB — CBC WITH DIFFERENTIAL/PLATELET
BASO%: 0.8 % (ref 0.0–2.0)
Basophils Absolute: 0 10*3/uL (ref 0.0–0.1)
EOS%: 3.5 % (ref 0.0–7.0)
HCT: 32.4 % — ABNORMAL LOW (ref 38.4–49.9)
LYMPH%: 36.1 % (ref 14.0–49.0)
MCH: 28.4 pg (ref 27.2–33.4)
MCHC: 33.8 g/dL (ref 32.0–36.0)
MCV: 84 fL (ref 79.3–98.0)
MONO%: 3.7 % (ref 0.0–14.0)
NEUT%: 55.9 % (ref 39.0–75.0)
Platelets: 208 10*3/uL (ref 140–400)
lymph#: 1.5 10*3/uL (ref 0.9–3.3)

## 2011-03-09 ENCOUNTER — Encounter: Payer: Self-pay | Admitting: Internal Medicine

## 2011-03-10 ENCOUNTER — Ambulatory Visit: Payer: MEDICARE | Admitting: Cardiology

## 2011-03-10 LAB — COMPREHENSIVE METABOLIC PANEL
Albumin: 4.3 g/dL (ref 3.5–5.2)
CO2: 23 mEq/L (ref 19–32)
Calcium: 8.9 mg/dL (ref 8.4–10.5)
Chloride: 101 mEq/L (ref 96–112)
Glucose, Bld: 187 mg/dL — ABNORMAL HIGH (ref 70–99)
Sodium: 137 mEq/L (ref 135–145)
Total Bilirubin: 0.3 mg/dL (ref 0.3–1.2)
Total Protein: 6.9 g/dL (ref 6.0–8.3)

## 2011-03-10 LAB — IRON AND TIBC
Iron: 95 ug/dL (ref 42–165)
UIBC: 251 ug/dL

## 2011-03-10 LAB — LACTATE DEHYDROGENASE: LDH: 96 U/L (ref 94–250)

## 2011-03-10 LAB — FERRITIN: Ferritin: 155 ng/mL (ref 22–322)

## 2011-03-11 ENCOUNTER — Ambulatory Visit
Admission: RE | Admit: 2011-03-11 | Discharge: 2011-03-11 | Disposition: A | Discharge status: Home / Self Care | Payer: MEDICARE | Source: Ambulatory Visit | Attending: Surgery | Admitting: Surgery

## 2011-03-11 DIAGNOSIS — Z433 Encounter for attention to colostomy: Secondary | ICD-10-CM

## 2011-03-14 HISTORY — PX: OTHER SURGICAL HISTORY: SHX169

## 2011-03-24 ENCOUNTER — Encounter: Payer: Self-pay | Admitting: Cardiology

## 2011-03-24 ENCOUNTER — Ambulatory Visit (INDEPENDENT_AMBULATORY_CARE_PROVIDER_SITE_OTHER): Payer: Medicare Other | Admitting: Cardiology

## 2011-03-24 VITALS — BP 130/70 | HR 80 | Resp 18 | Ht 76.0 in | Wt 186.1 lb

## 2011-03-24 DIAGNOSIS — E785 Hyperlipidemia, unspecified: Secondary | ICD-10-CM

## 2011-03-24 DIAGNOSIS — I10 Essential (primary) hypertension: Secondary | ICD-10-CM

## 2011-03-24 DIAGNOSIS — E119 Type 2 diabetes mellitus without complications: Secondary | ICD-10-CM

## 2011-03-24 DIAGNOSIS — Z01818 Encounter for other preprocedural examination: Secondary | ICD-10-CM

## 2011-03-24 DIAGNOSIS — I2510 Atherosclerotic heart disease of native coronary artery without angina pectoris: Secondary | ICD-10-CM

## 2011-03-24 LAB — DIFFERENTIAL
Basophils Absolute: 0 10*3/uL (ref 0.0–0.1)
Basophils Relative: 1 % (ref 0–1)
Eosinophils Absolute: 0 10*3/uL (ref 0.0–0.7)
Eosinophils Relative: 2 % (ref 0–5)
Lymphocytes Relative: 48 % — ABNORMAL HIGH (ref 12–46)
Monocytes Absolute: 0.6 10*3/uL (ref 0.1–1.0)
Monocytes Relative: 21 % — ABNORMAL HIGH (ref 3–12)
Neutro Abs: 0.5 10*3/uL — ABNORMAL LOW (ref 1.7–7.7)
Neutro Abs: 0.9 10*3/uL — ABNORMAL LOW (ref 1.7–7.7)
Neutrophils Relative %: 27 % — ABNORMAL LOW (ref 43–77)

## 2011-03-24 LAB — CBC
HCT: 33 % — ABNORMAL LOW (ref 39.0–52.0)
HCT: 36.8 % — ABNORMAL LOW (ref 39.0–52.0)
Hemoglobin: 12.6 g/dL — ABNORMAL LOW (ref 13.0–17.0)
Hemoglobin: 13.3 g/dL (ref 13.0–17.0)
MCHC: 35.1 g/dL (ref 30.0–36.0)
MCHC: 34.3 g/dL (ref 30.0–36.0)
MCV: 86.1 fL (ref 78.0–100.0)
MCV: 87.7 fL (ref 78.0–100.0)
Platelets: 194 10*3/uL (ref 150–400)
Platelets: 195 10*3/uL (ref 150–400)
RBC: 3.83 MIL/uL — ABNORMAL LOW (ref 4.22–5.81)
RBC: 4.19 MIL/uL — ABNORMAL LOW (ref 4.22–5.81)
RDW: 13.9 % (ref 11.5–15.5)

## 2011-03-24 LAB — HEMOGLOBIN A1C: Hgb A1c MFr Bld: 6.9 % — ABNORMAL HIGH (ref 4.6–6.1)

## 2011-03-24 LAB — BASIC METABOLIC PANEL
BUN: 9 mg/dL (ref 6–23)
CO2: 28 mEq/L (ref 19–32)
Chloride: 97 mEq/L (ref 96–112)
Creatinine, Ser: 0.89 mg/dL (ref 0.4–1.5)
Potassium: 3.9 mEq/L (ref 3.5–5.1)

## 2011-03-24 LAB — GLUCOSE, CAPILLARY
Glucose-Capillary: 127 mg/dL — ABNORMAL HIGH (ref 70–99)
Glucose-Capillary: 145 mg/dL — ABNORMAL HIGH (ref 70–99)
Glucose-Capillary: 148 mg/dL — ABNORMAL HIGH (ref 70–99)

## 2011-03-24 LAB — URINALYSIS, ROUTINE W REFLEX MICROSCOPIC
Bilirubin Urine: NEGATIVE
Glucose, UA: NEGATIVE mg/dL
Hgb urine dipstick: NEGATIVE
Protein, ur: NEGATIVE mg/dL
Specific Gravity, Urine: 1.016 (ref 1.005–1.030)
Urobilinogen, UA: 0.2 mg/dL (ref 0.0–1.0)

## 2011-03-24 LAB — POCT I-STAT, CHEM 8
BUN: 13 mg/dL (ref 6–23)
Creatinine, Ser: 0.9 mg/dL (ref 0.4–1.5)
Glucose, Bld: 181 mg/dL — ABNORMAL HIGH (ref 70–99)
Hemoglobin: 12.6 g/dL — ABNORMAL LOW (ref 13.0–17.0)
TCO2: 25 mmol/L (ref 0–100)

## 2011-03-24 LAB — BONE MARROW EXAM

## 2011-03-24 NOTE — Assessment & Plan Note (Signed)
Patient will have a lexiscan myoview prior to surgery.

## 2011-03-24 NOTE — Assessment & Plan Note (Signed)
Presumed coronary disease from previous abnormal Myoview. Continue aspirin and statin.

## 2011-03-24 NOTE — Assessment & Plan Note (Signed)
Continue statin. Lipids and liver monitored by primary care. 

## 2011-03-24 NOTE — Assessment & Plan Note (Signed)
Management per primary care. 

## 2011-03-24 NOTE — Assessment & Plan Note (Signed)
Blood pressure controlled. Continue present medications. 

## 2011-03-24 NOTE — Patient Instructions (Signed)
Your physician has requested that you have a lexiscan myoview. For further information please visit [email or web address]. Please follow instruction sheet, as given.   Your physician recommends that you schedule a follow-up appointment in: one year  

## 2011-03-24 NOTE — Progress Notes (Signed)
HPI: 77 year old male previously followed by Dr. Diona Browner for presumed coronary disease for followup. The patient had a Myoview in the past that apparently was low risk and showed inferior ischemia. He has been treated medically. There is no dyspnea, chest pain or syncope. There is no pedal edema. In the near future he is scheduled for surgery for ileostomy takedown.  Current Outpatient Prescriptions  Medication Sig Dispense Refill  . allopurinol (ZYLOPRIM) 100 MG tablet Take 100 mg by mouth daily.        Marland Kitchen aspirin 81 MG tablet Take 81 mg by mouth daily.        . benazepril (LOTENSIN) 5 MG tablet Take 2.5 mg by mouth daily.        . ciprofloxacin (CIPRO) 500 MG tablet Take 500 mg by mouth as needed.        . doxazosin (CARDURA) 4 MG tablet Take 2 mg by mouth at bedtime.       . finasteride (PROSCAR) 5 MG tablet Take 5 mg by mouth daily.        . hydrochlorothiazide 25 MG tablet Take 25 mg by mouth daily.        . insulin regular (HUMULIN R,NOVOLIN R) 100 UNIT/ML injection Sliding scale       . pravastatin (PRAVACHOL) 20 MG tablet Take 20 mg by mouth daily.        Marland Kitchen topiramate (TOPAMAX) 50 MG tablet Take 50 mg by mouth 2 (two) times daily.        Marland Kitchen dipyridamole-aspirin (AGGRENOX) 25-200 MG per 12 hr capsule Take 1 capsule by mouth 2 (two) times daily.        . metFORMIN (GLUCOPHAGE) 500 MG tablet Take 1 tab in the morning and 2 at night       . sitaGLIPtan (JANUVIA) 100 MG tablet Take 100 mg by mouth daily.           Past Medical History  Diagnosis Date  . Aortic root dilatation   . DM2 (diabetes mellitus, type 2)   . Stroke   . HLD (hyperlipidemia)   . HTN (hypertension)   . CAD (coronary artery disease)     Presumed from previously mildy abnormal myoview  . Leukopenia   . Pneumothorax   . Rectal cancer     Past Surgical History  Procedure Date  . Hip arthroplasty   . Tonsillectomy   . S/p resection of rectal cancer     History   Social History  . Marital Status: Married      Spouse Name: N/A    Number of Children: N/A  . Years of Education: N/A   Occupational History  . retired    Social History Main Topics  . Smoking status: Never Smoker   . Smokeless tobacco: Not on file  . Alcohol Use: No  . Drug Use: No  . Sexually Active: Not on file   Other Topics Concern  . Not on file   Social History Narrative  . No narrative on file    ROS: no fevers or chills, productive cough, hemoptysis, dysphasia, odynophagia, melena, hematochezia, dysuria, hematuria, rash, seizure activity, orthopnea, PND, pedal edema, claudication. Remaining systems are negative.  Physical Exam: Well-developed well-nourished in no acute distress.  Skin is warm and dry.  HEENT is normal.  Neck is supple. No thyromegaly.  Chest is clear to auscultation with normal expansion.  Cardiovascular exam is regular rate and rhythm.  Abdominal exam nontender or distended. No masses palpated. Ileostomy.  Extremities show no edema. neuro grossly intact  ECG Sinus rhythm with PACs.

## 2011-03-25 LAB — COMPREHENSIVE METABOLIC PANEL
ALT: 29 U/L (ref 0–53)
Alkaline Phosphatase: 53 U/L (ref 39–117)
BUN: 17 mg/dL (ref 6–23)
CO2: 28 mEq/L (ref 19–32)
Chloride: 97 mEq/L (ref 96–112)
Glucose, Bld: 207 mg/dL — ABNORMAL HIGH (ref 70–99)
Potassium: 3.9 mEq/L (ref 3.5–5.1)
Sodium: 132 mEq/L — ABNORMAL LOW (ref 135–145)
Total Bilirubin: 1 mg/dL (ref 0.3–1.2)
Total Protein: 6.4 g/dL (ref 6.0–8.3)

## 2011-03-25 LAB — POCT I-STAT, CHEM 8
BUN: 20 mg/dL (ref 6–23)
Calcium, Ion: 1.16 mmol/L (ref 1.12–1.32)
Creatinine, Ser: 1.3 mg/dL (ref 0.4–1.5)
Glucose, Bld: 186 mg/dL — ABNORMAL HIGH (ref 70–99)
Hemoglobin: 13.3 g/dL (ref 13.0–17.0)
Sodium: 136 mEq/L (ref 135–145)
TCO2: 27 mmol/L (ref 0–100)

## 2011-03-25 LAB — LIPID PANEL
LDL Cholesterol: 63 mg/dL (ref 0–99)
Total CHOL/HDL Ratio: 3.7 RATIO
VLDL: 11 mg/dL (ref 0–40)

## 2011-03-25 LAB — DIFFERENTIAL
Basophils Relative: 1 % (ref 0–1)
Eosinophils Absolute: 0.1 10*3/uL (ref 0.0–0.7)
Eosinophils Relative: 2 % (ref 0–5)
Lymphs Abs: 0.7 10*3/uL (ref 0.7–4.0)
Monocytes Absolute: 0.5 10*3/uL (ref 0.1–1.0)
Monocytes Relative: 17 % — ABNORMAL HIGH (ref 3–12)
Neutrophils Relative %: 57 % (ref 43–77)

## 2011-03-25 LAB — URINALYSIS, ROUTINE W REFLEX MICROSCOPIC
Bilirubin Urine: NEGATIVE
Ketones, ur: NEGATIVE mg/dL
Nitrite: NEGATIVE
Specific Gravity, Urine: 1.017 (ref 1.005–1.030)
Urobilinogen, UA: 0.2 mg/dL (ref 0.0–1.0)
pH: 5.5 (ref 5.0–8.0)

## 2011-03-25 LAB — PROTEIN S ACTIVITY: Protein S Activity: 86 % (ref 69–129)

## 2011-03-25 LAB — CBC
HCT: 37.7 % — ABNORMAL LOW (ref 39.0–52.0)
MCHC: 35.4 g/dL (ref 30.0–36.0)
MCV: 86.8 fL (ref 78.0–100.0)
RBC: 4.34 MIL/uL (ref 4.22–5.81)
WBC: 3 10*3/uL — ABNORMAL LOW (ref 4.0–10.5)

## 2011-03-25 LAB — PROTEIN C ACTIVITY: Protein C Activity: 117 % (ref 75–133)

## 2011-03-25 LAB — LUPUS ANTICOAGULANT PANEL
DRVVT: 34.7 secs — ABNORMAL LOW (ref 36.1–47.0)
PTT Lupus Anticoagulant: 41.5 secs (ref 36.3–48.8)

## 2011-03-25 LAB — HOMOCYSTEINE: Homocysteine: 12.4 umol/L (ref 4.0–15.4)

## 2011-03-25 LAB — CK TOTAL AND CKMB (NOT AT ARMC): CK, MB: 4 ng/mL (ref 0.3–4.0)

## 2011-03-25 LAB — BETA-2-GLYCOPROTEIN I ABS, IGG/M/A: Beta-2-Glycoprotein I IgM: <4 U/mL (ref <10)

## 2011-03-25 LAB — PROTEIN C, TOTAL: Protein C, Total: 104 % (ref 70–140)

## 2011-03-25 LAB — PROTEIN S, TOTAL: Protein S Ag, Total: 71 % (ref 70–140)

## 2011-03-25 LAB — CARDIOLIPIN ANTIBODIES, IGG, IGM, IGA: Anticardiolipin IgM: <7 [MPL'U] — ABNORMAL LOW (ref <10)

## 2011-03-25 LAB — HEMOGLOBIN A1C
Hgb A1c MFr Bld: 7.5 % — ABNORMAL HIGH (ref 4.6–6.1)
Mean Plasma Glucose: 169 mg/dL

## 2011-03-25 LAB — PROTHROMBIN GENE MUTATION

## 2011-03-30 ENCOUNTER — Other Ambulatory Visit (HOSPITAL_COMMUNITY): Payer: Self-pay | Admitting: *Deleted

## 2011-03-30 ENCOUNTER — Other Ambulatory Visit (HOSPITAL_COMMUNITY): Payer: Self-pay | Admitting: Radiology

## 2011-03-31 ENCOUNTER — Ambulatory Visit (HOSPITAL_COMMUNITY): Payer: Medicare Other | Attending: Cardiology | Admitting: Radiology

## 2011-03-31 DIAGNOSIS — I2510 Atherosclerotic heart disease of native coronary artery without angina pectoris: Secondary | ICD-10-CM | POA: Insufficient documentation

## 2011-03-31 DIAGNOSIS — R0602 Shortness of breath: Secondary | ICD-10-CM

## 2011-03-31 DIAGNOSIS — E109 Type 1 diabetes mellitus without complications: Secondary | ICD-10-CM

## 2011-03-31 DIAGNOSIS — E119 Type 2 diabetes mellitus without complications: Secondary | ICD-10-CM

## 2011-03-31 MED ORDER — REGADENOSON 0.4 MG/5ML IV SOLN
0.4000 mg | Freq: Once | INTRAVENOUS | Status: AC
  Administered 2011-03-31: 0.4 mg via INTRAVENOUS

## 2011-03-31 MED ORDER — TECHNETIUM TC 99M TETROFOSMIN IV KIT
10.1000 | PACK | Freq: Once | INTRAVENOUS | Status: AC | PRN
  Administered 2011-03-31: 10.1 via INTRAVENOUS

## 2011-03-31 MED ORDER — TECHNETIUM TC 99M TETROFOSMIN IV KIT
33.0000 | PACK | Freq: Once | INTRAVENOUS | Status: AC | PRN
  Administered 2011-03-31: 33 via INTRAVENOUS

## 2011-03-31 NOTE — Progress Notes (Signed)
[facility] SITE 3 NUCLEAR MED [address] [place] [address] [phone]  Cardiology Nuclear Med Study  [name] is a 77 y.o. male [id number] 18-Dec-1931   Nuclear Med Background Indication for Stress Test:  Evaluation for Ischemia and Pending Surgical Clearance: Colostomy closure: Dr. Maisie Fus Cornett 04/06/11 History:  Echo and Myocardial Perfusion Study Cardiac Risk Factors: CVA, Hypertension, IDDM Type 2 and Lipids  Symptoms:  DOE, Fatigue and Palpitations   Nuclear Pre-Procedure Caffeine/Decaff Intake:  None NPO After: 7:00pm   Lungs:  clear IV 0.9% NS with Angio Cath:  20g  IV Site: R Forearm  IV Started by:  Stanton Kidney, EMT-P  Chest Size (in):  46 Cup Size: n/a  Height: 6\' 4"  (1.93 m)  Weight:  184 lb (83.462 kg)  BMI:  Body mass index is 22.40 kg/(m^2). Tech Comments:  NA    Nuclear Med Study 1 or 2 day study: 1 day  Stress Test Type:  Eugenie Birks  Reading MD: Marca Ancona, MD  Order Authorizing Provider:  B.Crenshaw  Resting Radionuclide: Technetium 18m Tetrofosmin  Resting Radionuclide Dose: 10.1 mCi   Stress Radionuclide:  Technetium 74m Tetrofosmin  Stress Radionuclide Dose: 33.0 mCi           Stress Protocol Rest HR: 56 Stress HR: 100  Rest BP: 124/56 Stress BP: 128/59  Exercise Time (min): n/a METS: n/a   Predicted Max HR: 144 bpm % Max HR: 69.44 bpm Rate Pressure Product: [id number]    Dose of Adenosine (mg):  n/a Dose of Lexiscan: 0.4 mg  Dose of Atropine (mg): n/a Dose of Dobutamine: n/a mcg/kg/min (at max HR)  Stress Test Technologist: Milana Na, EMT-P  Nuclear Technologist:  Doyne Keel, CNMT     Rest Procedure:  Myocardial perfusion imaging was performed at rest 45 minutes following the intravenous administration of Technetium 68m Tetrofosmin. Rest ECG: NSR  Stress Procedure:  The patient received IV Lexiscan 0.4 mg over 15-seconds.  Technetium 42m Tetrofosmin injected at 30-seconds.  There were no significant  changes and freq pvcs with Lexiscan.  Quantitative spect images were obtained after a 45 minute delay. Stress ECG: No significant change from baseline ECG  QPS Raw Data Images:  Normal; no motion artifact; normal heart/lung ratio. Stress Images:  Normal homogeneous uptake in all areas of the myocardium. Rest Images:  Normal homogeneous uptake in all areas of the myocardium. Subtraction (SDS):  There is no evidence of scar or ischemia. Transient Ischemic Dilatation (Normal <1.22):  1.12 Lung/Heart Ratio (Normal <0.45):  0.39  Quantitative Gated Spect Images QGS EDV:  89 ml QGS ESV:  24 ml QGS cine images:  NL LV Function; NL Wall Motion QGS EF: 73%  Impression Exercise Capacity:  Lexiscan with no exercise. BP Response:  Normal blood pressure response. Clinical Symptoms:  Short of breath. ECG Impression:  No evidence for ischemia.  Comparison with Prior Nuclear Study: No images to compare  Overall Impression:  Normal stress nuclear study.  Dalton [employer]

## 2011-04-01 ENCOUNTER — Encounter (HOSPITAL_COMMUNITY): Payer: Self-pay | Admitting: Cardiology

## 2011-04-01 ENCOUNTER — Encounter (HOSPITAL_COMMUNITY)
Admission: RE | Admit: 2011-04-01 | Discharge: 2011-04-01 | Disposition: A | Discharge status: Home / Self Care | Payer: Medicare Other | Source: Ambulatory Visit | Attending: Surgery | Admitting: Surgery

## 2011-04-01 LAB — COMPREHENSIVE METABOLIC PANEL
Albumin: 3.9 g/dL (ref 3.5–5.2)
BUN: 25 mg/dL — ABNORMAL HIGH (ref 6–23)
Creatinine, Ser: 1.42 mg/dL (ref 0.4–1.5)
Glucose, Bld: 143 mg/dL — ABNORMAL HIGH (ref 70–99)
Total Bilirubin: 0.3 mg/dL (ref 0.3–1.2)
Total Protein: 6.8 g/dL (ref 6.0–8.3)

## 2011-04-01 LAB — DIFFERENTIAL
Basophils Relative: 2 % — ABNORMAL HIGH (ref 0–1)
Eosinophils Relative: 3 % (ref 0–5)
Lymphs Abs: 1.8 10*3/uL (ref 0.7–4.0)
Monocytes Absolute: 0.6 10*3/uL (ref 0.1–1.0)
Neutro Abs: 0.4 10*3/uL — ABNORMAL LOW (ref 1.7–7.7)

## 2011-04-01 LAB — CBC
MCH: 27.5 pg (ref 26.0–34.0)
MCV: 82.8 fL (ref 78.0–100.0)
Platelets: 271 10*3/uL (ref 150–400)
RDW: 14.5 % (ref 11.5–15.5)

## 2011-04-01 LAB — SURGICAL PCR SCREEN
MRSA, PCR: NEGATIVE
Staphylococcus aureus: NEGATIVE

## 2011-04-01 NOTE — Progress Notes (Signed)
ROUTED TO DR. CRENSHAW.[name] ° °

## 2011-04-01 NOTE — Progress Notes (Signed)
pt aware of myoview results [name]  

## 2011-04-02 ENCOUNTER — Telehealth: Payer: Self-pay | Admitting: Cardiology

## 2011-04-02 LAB — PATHOLOGIST SMEAR REVIEW

## 2011-04-02 NOTE — Telephone Encounter (Signed)
Faxed OV, EKG & Stress to [facility] Short Stay ([phone]).

## 2011-04-05 ENCOUNTER — Encounter (HOSPITAL_BASED_OUTPATIENT_CLINIC_OR_DEPARTMENT_OTHER): Payer: Medicare Other | Admitting: Oncology

## 2011-04-05 ENCOUNTER — Other Ambulatory Visit (HOSPITAL_COMMUNITY): Payer: Self-pay | Admitting: Oncology

## 2011-04-05 ENCOUNTER — Encounter: Payer: Medicare Other | Admitting: Oncology

## 2011-04-05 DIAGNOSIS — D709 Neutropenia, unspecified: Secondary | ICD-10-CM

## 2011-04-05 DIAGNOSIS — C189 Malignant neoplasm of colon, unspecified: Secondary | ICD-10-CM

## 2011-04-05 LAB — CBC WITH DIFFERENTIAL/PLATELET
Basophils Absolute: 0 10*3/uL (ref 0.0–0.1)
EOS%: 1.6 % (ref 0.0–7.0)
HGB: 11.6 g/dL — ABNORMAL LOW (ref 13.0–17.1)
MCH: 28.9 pg (ref 27.2–33.4)
MONO%: 8.3 % (ref 0.0–14.0)
NEUT#: 6.3 10*3/uL (ref 1.5–6.5)
RBC: 4.01 10*6/uL — ABNORMAL LOW (ref 4.20–5.82)
RDW: 15.3 % — ABNORMAL HIGH (ref 11.0–14.6)
lymph#: 2 10*3/uL (ref 0.9–3.3)

## 2011-04-06 ENCOUNTER — Inpatient Hospital Stay (HOSPITAL_COMMUNITY)
Admission: RE | Admit: 2011-04-06 | Discharge: 2011-04-11 | DRG: 346 | Disposition: A | Discharge status: Home Health | Payer: Medicare Other | Source: Ambulatory Visit | Attending: Surgery | Admitting: Surgery

## 2011-04-06 ENCOUNTER — Inpatient Hospital Stay (HOSPITAL_COMMUNITY): Payer: Medicare Other

## 2011-04-06 ENCOUNTER — Other Ambulatory Visit: Payer: Self-pay | Admitting: Surgery

## 2011-04-06 DIAGNOSIS — G40909 Epilepsy, unspecified, not intractable, without status epilepticus: Secondary | ICD-10-CM | POA: Diagnosis present

## 2011-04-06 DIAGNOSIS — Z432 Encounter for attention to ileostomy: Principal | ICD-10-CM

## 2011-04-06 DIAGNOSIS — Z85048 Personal history of other malignant neoplasm of rectum, rectosigmoid junction, and anus: Secondary | ICD-10-CM

## 2011-04-06 DIAGNOSIS — Z8673 Personal history of transient ischemic attack (TIA), and cerebral infarction without residual deficits: Secondary | ICD-10-CM

## 2011-04-06 DIAGNOSIS — M109 Gout, unspecified: Secondary | ICD-10-CM | POA: Diagnosis not present

## 2011-04-06 DIAGNOSIS — Z79899 Other long term (current) drug therapy: Secondary | ICD-10-CM

## 2011-04-06 DIAGNOSIS — D72819 Decreased white blood cell count, unspecified: Secondary | ICD-10-CM | POA: Diagnosis present

## 2011-04-06 DIAGNOSIS — I2510 Atherosclerotic heart disease of native coronary artery without angina pectoris: Secondary | ICD-10-CM | POA: Diagnosis present

## 2011-04-06 DIAGNOSIS — I10 Essential (primary) hypertension: Secondary | ICD-10-CM | POA: Diagnosis present

## 2011-04-06 DIAGNOSIS — E109 Type 1 diabetes mellitus without complications: Secondary | ICD-10-CM | POA: Diagnosis present

## 2011-04-06 DIAGNOSIS — Z01812 Encounter for preprocedural laboratory examination: Secondary | ICD-10-CM

## 2011-04-06 DIAGNOSIS — Z794 Long term (current) use of insulin: Secondary | ICD-10-CM

## 2011-04-06 LAB — GLUCOSE, CAPILLARY
Glucose-Capillary: 198 mg/dL — ABNORMAL HIGH (ref 70–99)
Glucose-Capillary: 182 mg/dL — ABNORMAL HIGH (ref 70–99)
Glucose-Capillary: 227 mg/dL — ABNORMAL HIGH (ref 70–99)
Glucose-Capillary: 220 mg/dL — ABNORMAL HIGH (ref 70–99)

## 2011-04-07 LAB — GLUCOSE, CAPILLARY
Glucose-Capillary: 186 mg/dL — ABNORMAL HIGH (ref 70–99)
Glucose-Capillary: 164 mg/dL — ABNORMAL HIGH (ref 70–99)
Glucose-Capillary: 165 mg/dL — ABNORMAL HIGH (ref 70–99)
Glucose-Capillary: 166 mg/dL — ABNORMAL HIGH (ref 70–99)
Glucose-Capillary: 147 mg/dL — ABNORMAL HIGH (ref 70–99)

## 2011-04-07 LAB — CBC
MCV: 83.3 fL (ref 78.0–100.0)
Platelets: 220 10*3/uL (ref 150–400)
RDW: 14.6 % (ref 11.5–15.5)
WBC: 3.1 10*3/uL — ABNORMAL LOW (ref 4.0–10.5)

## 2011-04-07 LAB — COMPREHENSIVE METABOLIC PANEL
AST: 12 U/L (ref 0–37)
CO2: 28 mEq/L (ref 19–32)
Calcium: 8.8 mg/dL (ref 8.4–10.5)
Creatinine, Ser: 1 mg/dL (ref 0.4–1.5)
GFR calc Af Amer: >60 mL/min (ref >60)
GFR calc non Af Amer: >60 mL/min (ref >60)
Glucose, Bld: 182 mg/dL — ABNORMAL HIGH (ref 70–99)
Total Protein: 6.1 g/dL (ref 6.0–8.3)

## 2011-04-08 ENCOUNTER — Inpatient Hospital Stay (HOSPITAL_COMMUNITY): Payer: Medicare Other

## 2011-04-08 LAB — BASIC METABOLIC PANEL
BUN: 10 mg/dL (ref 6–23)
Calcium: 9 mg/dL (ref 8.4–10.5)
Creatinine, Ser: 1.02 mg/dL (ref 0.4–1.5)
GFR calc non Af Amer: >60 mL/min (ref >60)
Glucose, Bld: 179 mg/dL — ABNORMAL HIGH (ref 70–99)

## 2011-04-08 LAB — GLUCOSE, CAPILLARY
Glucose-Capillary: 180 mg/dL — ABNORMAL HIGH (ref 70–99)
Glucose-Capillary: 187 mg/dL — ABNORMAL HIGH (ref 70–99)

## 2011-04-08 LAB — CBC
HCT: 30.7 % — ABNORMAL LOW (ref 39.0–52.0)
MCH: 28 pg (ref 26.0–34.0)
MCHC: 33.6 g/dL (ref 30.0–36.0)
MCV: 83.4 fL (ref 78.0–100.0)
RDW: 14.7 % (ref 11.5–15.5)

## 2011-04-09 LAB — BASIC METABOLIC PANEL
Calcium: 9 mg/dL (ref 8.4–10.5)
GFR calc non Af Amer: >60 mL/min (ref >60)
Glucose, Bld: 176 mg/dL — ABNORMAL HIGH (ref 70–99)
Sodium: 135 mEq/L (ref 135–145)

## 2011-04-09 LAB — GLUCOSE, CAPILLARY
Glucose-Capillary: 179 mg/dL — ABNORMAL HIGH (ref 70–99)
Glucose-Capillary: 236 mg/dL — ABNORMAL HIGH (ref 70–99)

## 2011-04-09 LAB — CBC
HCT: 29.7 % — ABNORMAL LOW (ref 39.0–52.0)
MCHC: 33.7 g/dL (ref 30.0–36.0)
RDW: 14.4 % (ref 11.5–15.5)

## 2011-04-10 LAB — GLUCOSE, CAPILLARY: Glucose-Capillary: 188 mg/dL — ABNORMAL HIGH (ref 70–99)

## 2011-04-11 LAB — GLUCOSE, CAPILLARY: Glucose-Capillary: 202 mg/dL — ABNORMAL HIGH (ref 70–99)

## 2011-04-12 NOTE — Op Note (Signed)
NAMEJAYLEEN, AFONSO NO.:  [id number]  MEDICAL RECORD NO.:  [id number]           PATIENT TYPE:  I  LOCATION:  5154                         FACILITY:  [facility]  PHYSICIAN:  Mariann Palo A. Amorie Rentz, M.D.DATE OF BIRTH:  [birth year]  DATE OF PROCEDURE:  04/06/2011 DATE OF DISCHARGE:                              OPERATIVE REPORT   PREOPERATIVE DIAGNOSIS:  History of rectal cancer with diverting loop ileostomy.  POSTOPERATIVE DIAGNOSIS:  History of rectal cancer with diverting loop ileostomy.  PROCEDURE:  Closure of loop ileostomy.  SURGEON:  Maisie Fus A. Sulma Ruffino, MD.  ASSISTANT:  Adolph Pollack, MD.  ANESTHESIA:  General endotracheal anesthesia.  ESTIMATED BLOOD LOSS:  Minimal.  SPECIMEN:  Segment of ileum to pathology.  DRAINS:  None.  INDICATIONS FOR PROCEDURE:  The patient is a 77 year old male, who back in January underwent a laparoscopic-assisted low anterior resection for T1 N0 MX rectal cancer.  He is averted due to all the medical problems and the low anastomosis.  He has had done well and recovered from that. He has been 3 months now.  Barium enema showed that the anastomosis had healed.  He returns to have his ileostomy taken down.  Informed consent was obtained.  Risk of bleeding, infection, leakage, fistula, the need for the surgery, wound complications, hernia, exacerbation of underlying medical problems were all discussed with the patient preoperatively.  He agreed to proceed.  DESCRIPTION OF PROCEDURE:  The patient was brought to the operating room.  After induction of general anesthesia, Foley catheter was placed. Abdomen was then prepped and draped in sterile fashion.  Time-out was done.  Curvilinear incision was made above and below the right lower quadrant ileostomy.  We were able to use an Allis to grab the edge of the skin, dissect circumferentially around the loop ileostomy down to the fascia.  The fascia was identified and carefully  opened into the abdominal cavity circumferentially.  The loop of terminal ileum was brought into the field.  A GIA-75 stapling device was placed across the each limb of the loop ileostomy to divide the bowel and this segment was sent to pathology which include the skin.  We then created a functional side-to-side and functional end-to-end anastomosis with a GIA-75 device after placing two small enterotomies in the each limb of the ileum.  We fired the staple without difficulty.  Staple line was hemostatic. Common enterotomy was closed in two layers with a running 3-0 PDS suture and then the external layers using 3-0 Vicryl pop offs.  There is no tension in the anastomosis and it closed well.  Blood supply was excellent.  The lumen was patent to about three fingerbreadths.  Stitch was placed across the anastomosis.  Mesenteric defect was closed with single stitch of 3-0 Vicryl.  This was then placed back into the abdominal cavity.  There is no twisting or kinking.  There is no tension and it looks like the blood supply was excellent.  I was then closed the fascia with a running #1 PDS.  Wound VAC was placed in the old ostomy site and placed to suction.  All  final counts of sponge, needle, and instruments were found to be correct at this portion of case.  The patient was extubated and taken to recovery in satisfactory condition.     Hurshel Bouillon A. Morgon Pamer, M.D.     TAC/MEDQ  D:  04/06/2011  T:  04/06/2011  Job:  [id number]  cc:   Samul Dada, M.D. Wilhemina Bonito. Marina Goodell, MD  Electronically Signed by Harriette Bouillon M.D. on 04/12/2011 07:37:02 AM

## 2011-04-19 ENCOUNTER — Other Ambulatory Visit (HOSPITAL_COMMUNITY): Payer: Self-pay | Admitting: Oncology

## 2011-04-19 ENCOUNTER — Encounter (HOSPITAL_BASED_OUTPATIENT_CLINIC_OR_DEPARTMENT_OTHER): Payer: Medicare Other | Admitting: Oncology

## 2011-04-19 DIAGNOSIS — C189 Malignant neoplasm of colon, unspecified: Secondary | ICD-10-CM

## 2011-04-19 DIAGNOSIS — D709 Neutropenia, unspecified: Secondary | ICD-10-CM

## 2011-04-19 LAB — CBC WITH DIFFERENTIAL/PLATELET
BASO%: 0.5 % (ref 0.0–2.0)
HCT: 31.9 % — ABNORMAL LOW (ref 38.4–49.9)
LYMPH%: 30.5 % (ref 14.0–49.0)
MCHC: 33.5 g/dL (ref 32.0–36.0)
MONO#: 0.4 10*3/uL (ref 0.1–0.9)
NEUT%: 61.3 % (ref 39.0–75.0)
Platelets: 284 10*3/uL (ref 140–400)
WBC: 6 10*3/uL (ref 4.0–10.3)

## 2011-04-22 ENCOUNTER — Other Ambulatory Visit (HOSPITAL_COMMUNITY): Payer: Self-pay | Admitting: Oncology

## 2011-04-22 ENCOUNTER — Telehealth: Payer: Self-pay | Admitting: Cardiology

## 2011-04-22 ENCOUNTER — Encounter (HOSPITAL_BASED_OUTPATIENT_CLINIC_OR_DEPARTMENT_OTHER): Payer: Medicare Other | Admitting: Oncology

## 2011-04-22 DIAGNOSIS — D709 Neutropenia, unspecified: Secondary | ICD-10-CM

## 2011-04-22 DIAGNOSIS — C189 Malignant neoplasm of colon, unspecified: Secondary | ICD-10-CM

## 2011-04-22 LAB — IRON AND TIBC
%SAT: 4 % — ABNORMAL LOW (ref 20–55)
Iron: 14 ug/dL — ABNORMAL LOW (ref 42–165)
UIBC: 325 ug/dL

## 2011-04-22 LAB — CBC WITH DIFFERENTIAL/PLATELET
BASO%: 1.7 % (ref 0.0–2.0)
EOS%: 3.4 % (ref 0.0–7.0)
HCT: 30.1 % — ABNORMAL LOW (ref 38.4–49.9)
LYMPH%: 59.6 % — ABNORMAL HIGH (ref 14.0–49.0)
MCH: 27.7 pg (ref 27.2–33.4)
MCHC: 33.9 g/dL (ref 32.0–36.0)
MONO%: 30.9 % — ABNORMAL HIGH (ref 0.0–14.0)
NEUT%: 4.4 % — ABNORMAL LOW (ref 39.0–75.0)
Platelets: 301 10*3/uL (ref 140–400)
lymph#: 1.1 10*3/uL (ref 0.9–3.3)

## 2011-04-22 LAB — COMPREHENSIVE METABOLIC PANEL
Alkaline Phosphatase: 81 U/L (ref 39–117)
BUN: 22 mg/dL (ref 6–23)
Glucose, Bld: 228 mg/dL — ABNORMAL HIGH (ref 70–99)
Total Bilirubin: 0.4 mg/dL (ref 0.3–1.2)

## 2011-04-22 LAB — TRANSFERRIN RECEPTOR, SOLUABLE: Transferrin Receptor, Soluble: 1.24 mg/L (ref 0.76–1.76)

## 2011-04-22 NOTE — Telephone Encounter (Signed)
Pt home health said pt has a irregular heart beat

## 2011-04-22 NOTE — Telephone Encounter (Addendum)
Spoke with pt wife, she states the pt just had surgery and he is not feeling well. She states he is weak and not eating. He had blood work done with dr Cherie Dark and his wbc count is low. She states the home health nurse told her his heart was irregular. She said they told her his heart was skipping now and then. His bp and pulse rate were normal. Pt with PAC's on EKG done at last office visit. Reassurance given to wife. She will call back if pt condition changes [name]

## 2011-04-23 NOTE — Discharge Summary (Signed)
  [name], PRIOLA NO.:  [id number]  MEDICAL RECORD NO.:  [id number]           PATIENT TYPE:  I  LOCATION:  5154                         FACILITY:  [facility]  PHYSICIAN:  Lateya Dauria A. Zane Samson, M.D.DATE OF BIRTH:  1932/03/15  DATE OF ADMISSION:  04/06/2011 DATE OF DISCHARGE:  04/11/2011                              DISCHARGE SUMMARY   ADMITTING DIAGNOSIS:  History of rectal cancer with diverting loop ileostomy.  DISCHARGE DIAGNOSES:  History of rectal cancer with diverting loop ileostomy.  PROCEDURE PERFORMED:  Closure of loop ileostomy.  BRIEF HISTORY:  The patient is a 77 year old male who had a stage I rectal cancer resected about 3 months ago.  Due to persistent neutropenia, he was diverted.  He has recovered from all this, was brought back in for closure of loop ileostomy.  Hospital course relatively unremarkable.  He had an ileus for about 3 days.  His diet was advanced over the next 2 days.  He was discharged to home.  Wound was clean, dry, and intact with a wound vac in place until discharged, then we switched to wet-to-dry dressing changes.  He is ambulating, tolerating his diet and have bowel movements.  He was put back on his home medication.  He did have a flare-up of his calf while in the hospital, this was managed medically with satisfactory pain control.  DISCHARGE INSTRUCTIONS:  He will follow up in 1-2 weeks with me.  He will refrain from lifting, pushing, pulling or driving until he sees me back.  Home health has been arranged for wet-to-dry dressing changes to his right lower quadrant b.i.d.  He will resume his home meds as outlined in the medical reconciliation list, which was signed off on and he will be discharged home on Percocet one to two tabs q.4 h. p.r.n. for pain.  CONDITION ON DISCHARGE:  Improved.     Izadora Roehr A. Amire Leazer, M.D.     TAC/MEDQ  D:  04/19/2011  T:  04/19/2011  Job:  [id number]  Electronically Signed by Harriette Bouillon M.D. on 04/23/2011 11:16:13 AM

## 2011-04-26 ENCOUNTER — Other Ambulatory Visit (HOSPITAL_COMMUNITY): Payer: Self-pay | Admitting: Oncology

## 2011-04-26 ENCOUNTER — Encounter (HOSPITAL_BASED_OUTPATIENT_CLINIC_OR_DEPARTMENT_OTHER): Payer: Medicare Other | Admitting: Oncology

## 2011-04-26 DIAGNOSIS — D709 Neutropenia, unspecified: Secondary | ICD-10-CM

## 2011-04-26 DIAGNOSIS — C189 Malignant neoplasm of colon, unspecified: Secondary | ICD-10-CM

## 2011-04-26 LAB — CBC WITH DIFFERENTIAL/PLATELET
BASO%: 1.7 % (ref 0.0–2.0)
EOS%: 3.1 % (ref 0.0–7.0)
HCT: 29.9 % — ABNORMAL LOW (ref 38.4–49.9)
MCH: 29.1 pg (ref 27.2–33.4)
MCHC: 34.8 g/dL (ref 32.0–36.0)
MCV: 83.6 fL (ref 79.3–98.0)
MONO%: 17 % — ABNORMAL HIGH (ref 0.0–14.0)
NEUT%: 21.4 % — ABNORMAL LOW (ref 39.0–75.0)
RDW: 14.2 % (ref 11.0–14.6)
lymph#: 1.2 10*3/uL (ref 0.9–3.3)

## 2011-04-27 NOTE — Assessment & Plan Note (Signed)
[facility]                            CARDIOLOGY OFFICE NOTE   CHIRAG, KRUEGER                        MRN:          [id number]  DATE:05/18/2007                            DOB:          07-02-1932    PRIMARY CARE PHYSICIAN:  Dr. Lesle Chris.   REASON FOR VISIT:  Preoperative evaluation.   HISTORY OF PRESENT ILLNESS:  I saw Mr. Madani back in March of this  year for routine followup.  He has a history of a previously documented,  mildly abnormal myocardial profusion study suggesting the possibility of  mild inferior wall ischemia, although has not manifested any active  angina or dyspnea on exertion.  We have been managing him medically.  I  brought up with him at our last visit the possibility of a followup  Myoview study given the fact that it had been approximately 2 years  since his previous one.  He ultimately elected to defer this.  He is  being considered for a left total hip replacement on the 16th of this  month and is very eager to proceed.  He denies to me today having any  problems with exertional angina or dyspnea.  His electrocardiogram is  normal showing sinus rhythm at 66 beats per minute.  I reviewed with him  the situation and do think it would be reasonable to perform a Myoview  in advance of his surgery, simply to make sure that he had not had any  progressive evidence of ischemia.  He is not interested in any stress  testing at this point and prefers to proceed with surgery on medical  therapy alone.  I think in general his perioperative surgical risk is  not high, more than likely relatively low from a cardiac perspective at  least based on prior assessment and given his absence of symptoms and  normal electrocardiogram.  A peri-operative beta blocker would also be  beneficial.  I reviewed this with him and he is comfortable proceeding  on without preoperative cardiac testing.   ALLERGIES:  NO KNOWN DRUG ALLERGIES.   PRESENT MEDICATIONS:  1. Hydrochlorothiazide 25 mg p.o. every other day.  2. Proscar 5 mg p.o. daily.  3. Doxazosin 8 mg p.o. daily.  4. Aggrenox 1 p.o. b.i.d.  5. Metformin 500 mg p.o. b.i.d.  6. Benazepril 10 mg p.o. daily.  7. Crestor 5 mg p.o. every other day.  8. Januvia 100 mg p.o. daily.   REVIEW OF SYSTEMS:  As described in the history of present illness.   EXAMINATION:  Blood pressure is 129/78, heart rate is 73, weight is 227  pounds which is stable.  The patient is comfortable and in no acute  distress.  HEENT:  Conjunctivae and lids normal, oropharynx is clear.  NECK:  Supple without elevated jugular venous pressure or loud bruits,  no thyromegaly is noted.  LUNGS:  Clear without labored breathing.  CARDIAC:  Reveals a regular rate and rhythm, no loud murmur or S3  gallop.  ABDOMEN:  Soft, no bruit, nontender, normoactive bowel sounds.  EXTREMITIES:  Show no significant pitting  edema.  SKIN:  Warm and dry.  MUSCULOSKELETAL:  No kyphosis is noted.  NEUROPSYCHIATRIC:  Patient alert and oriented x3.   IMPRESSION AND RECOMMENDATIONS:  1. History of prior mildly abnormal exercise Myoview in 2006,      suggesting the possibility of mild inferior ischemia.  Mr. Dacus      has been managed medically since then without any active angina or      dyspnea and continues to be asymptomatic at this point.  I had      recommended a followup Myoview to him given the fact that it had      been over 2 years since his prior assessment simply to exclude the      possibility of progressive ischemia, although he is not interested      in pursuing any additional cardiac testing at this time.  I suspect      that his perioperative cardiac risk is not high and I think it is      not unreasonable for him to proceed on perioperative beta blocker      with total hip replacement.  He states that he understands the      potential risks and is very eager to proceed with surgery.  We       provided him a prescription for Toprol XL 25 mg daily which he will      being taking approximately one week before his surgery and continue      for at least a few weeks thereafter assuming he does well I can      plan to see him back for symptoms review over the next 6 months.  2. Further plans to follow.     Jonelle Sidle, MD  Electronically Signed    SGM/MedQ  DD: 05/18/2007  DT: 05/18/2007  Job #: [id number]   cc:   Brett Canales A. Cleta Alberts, M.D.  Ollen Gross, M.D.

## 2011-04-27 NOTE — Assessment & Plan Note (Signed)
[facility]                            CARDIOLOGY OFFICE NOTE   ERASMO, VERTZ                        MRN:          [id number]  DATE:05/16/2008                            DOB:          01-02-1932    PRIMARY CARE PHYSICIAN:  Brett Canales A. Cleta Alberts, MD   REASON FOR VISIT:  Routine followup.   HISTORY OF PRESENT ILLNESS:  Mr. Barrack comes in for a 65-month visit.  He denies any problems with angina.  His electrocardiogram shows normal  sinus rhythm at 64 beats per minute.  He remains on medications outlined  below.  LDL has been well controlled based on labs from Dr. Cleta Alberts.  His  LDL has typically been in the 50-60 range.  He is not having any  problems with myalgias.  He is still playing golf regularly.   ALLERGIES:  No known drug allergies.   PRESENT MEDICATIONS:  1. Proscar 5 mg p.o. daily.  2. Doxazosin 8 mg p.o. daily.  3. Aggrenox 1 p.o. b.i.d.  4. Metformin 500 mg p.o. b.i.d.  5. Benazepril 10 mg p.o. daily.  6. Januvia 100 mg p.o. daily.  7. Hydrochlorothiazide 25 mg p.o. daily.  8. Pravastatin 40 mg p.o. every other day.   REVIEW OF SYSTEMS:  As described in the history of present illness.  No  claudication.  No palpitations or syncope.  Otherwise negative.   PHYSICAL EXAMINATION:  Blood pressure today is 125/71, heart rate is 64,  and weight is 219 pounds, down from 224.  The patient is comfortable, in no acute distress.  NECK:  No elevated jugular venous pressure.  No audible bruits.  LUNGS:  Clear without labored breathing.  CARDIAC:  Regular rate and rhythm.  No loud murmur.  No S3 gallop.  EXTREMITIES:  Exhibit no pitting edema.   IMPRESSION AND RECOMMENDATIONS:  Presumed underlying cardiovascular  disease based on previous abnormal Myoview.  Mr. Kilburg is doing very  well on medical therapy without any angina or major functional  limitations.  He will plan to continue on risk factor  modification, regular exercise, and lipid  control.  He continues to see  Dr. Cleta Alberts.  I will plan for him to follow up with me over the next 6  months in our regional office.     Jonelle Sidle, MD  Electronically Signed    SGM/MedQ  DD: 05/16/2008  DT: 05/17/2008  Job #: [id number]   cc:   Brett Canales A. Cleta Alberts, M.D.

## 2011-04-27 NOTE — Procedures (Signed)
EEG NUMBER:   CLINICAL HISTORY:  This 77 year old male was admitted for seizure, two  nocturnal seizures witnessed by his wife, totally had 4 seizures in his  lifetime starting from December 2009.  Currently taking Keppra 500  b.i.d.   Past medical history of hypertension, diabetes, coronary artery disease,  leukopenia, TIA.   CURRENT MEDICATIONS:  Aggrenox, Cardura, Proscar, hydrochlorothiazide,  NovoLog, Keppra, Protonix, Zocor.   TECHNICAL COMMENT:  An 16-channel EEG was performed based on standard  international 10-20 system.  Total recording time is 26.1 minutes, with  one channel being dedicated to EKG, which has demonstrated normal sinus  rhythm of 72 beats per minute.   Upon wakening, posterior background activity was well developed, 8 Hz,  reactive to eye opening and closure.  There was no evidence of  epileptiform discharge, hyperventilation, photic stimulation with flash  frequency 1-19 Hz was performed.  There was no abnormality elicited.   The patient was not able to achieve sleep during the recording.   CONCLUSION:  This is normal awake EEG.  There is no evidence of  epileptiform discharge.      Levert Feinstein, MD  Electronically Signed     EA:[id number]  D:  04/11/2009 18:20:52  T:  04/12/2009 02:21:35  Job #:  [id number]

## 2011-04-27 NOTE — Discharge Summary (Signed)
[name], [name] NO.:  [id number]   MEDICAL RECORD NO.:  [id number]          PATIENT TYPE:  INP   LOCATION:  5501                         FACILITY:  [facility]   PHYSICIAN:  Santiago Bumpers. Hensel, M.D.DATE OF BIRTH:  04/02/1932   DATE OF ADMISSION:  04/11/2009  DATE OF DISCHARGE:  04/11/2009                               DISCHARGE SUMMARY   PRIMARY CARE PHYSICIAN:  Brett Canales A. Cleta Alberts, MD, [facility]   DISCHARGE DIAGNOSES:  1. Seizure disorder.  2. Hypertension.  3. Diabetes mellitus type 2.  4. Coronary artery disease.  5. Transient ischemic attack.  6. Leukopenia.   DISCHARGE MEDICATIONS:  1. Metformin 1000 mg by mouth twice daily.  2. Pravastatin 20 mg by mouth daily.  3. Doxazosin 8 mg by mouth daily.  4. Hydrochlorothiazide 25 mg by mouth daily.  5. Benazepril 10 mg by mouth daily.  6. Aggrenox 25/200 by mouth twice daily.  7. Finasteride 5 mg by mouth daily.  8. Januvia 100 mg by mouth daily.  9. Keppra 250 mg x1 week then 500 mg daily.   IMAGES:  1. MRI of the head was without contrast on April 11, 2009.      Impression:  Atrophy and chronic small vessel disease, but no sign      of acute or reversible process.   1. The patient had a CT of the head without contrast on February 20, 2009, during his prior hospitalization.  Impression:  No acute      intracranial abnormality.   1. The patient underwent an EEG on March 15, 2009.  Impression:  Normal      awake EEG with no evidence of epileptiform discharge.   LABORATORIES:  A1c 6.9.  Sodium 133, potassium 3.9, chloride 97, CO2 of  28, glucose 164, BUN 9, creatinine 0.89, and calcium 8.5.  White blood  cell count 2, hemoglobin 11.6, hematocrit 33.0, and platelets 194.  Urinalysis was negative.   BRIEF HOSPITAL COURSE:  [name] is a 76-year white male with  past history of hypertension, diabetes, hyperlipidemia, BPH, TIA, and  coronary artery disease that presented with 4 nocturnal  seizures.  Please see H and P for further details of this.  The patient was  admitted and observed throughout the day.  He had no further surgery or  seizure activity noted.  The patient underwent an MRI and EEG that were  both negative.   RECOMMENDATIONS:  1. Neurology was consulted and appreciated with the following      recommendations to initiate Keppra 250 mg p.o. b.i.d., just for 1      week and then to increase to 500 mg p.o. b.i.d.  2. Follow up with Dr. Pearlean Brownie in 3-6 weeks.  3. No driving until seizure free x6 months.   The patient was counseled about his diagnoses and discharged on the same  day.  Before discharge, he was also evaluated by PT/OT and found to be  independent of all activities.   FOLLOWUP:  The patient was instructed to follow up with Dr. Cleta Alberts,  at  [facility] and he was also given an appointment to see Dr. Terrace Arabia.      Helane Rima, MD  Electronically Signed      Santiago Bumpers. Leveda Anna, M.D.  Electronically Signed    EW/MEDQ  D:  06/12/2009  T:  06/13/2009  Job:  [id number]

## 2011-04-27 NOTE — Assessment & Plan Note (Signed)
[name]                          [facility] OFFICE NOTE   [name], [name]                        MRN:          [id number]  DATE:07/18/2009                            DOB:          25-Feb-1932    PRIMARY CARDIOLOGIST:  Jonelle Sidle, MD   REASON FOR VISIT:  A 33-month followup.   [name] returns to our clinic for scheduled followup, and ongoing  monitoring of presumed coronary artery disease.  He has no history of  documented coronary artery disease and, in fact, denies any prior  history of exertional angina pectoris.  A screening stress Myoview in  2006, reviewed by Dr. Andee Lineman, suggested a partially reversible defect in  the inferior wall.  Left ventricular function was normal, as was wall  motion.   [name] continues to golf on a regular basis, denying any associated  symptoms.  He has noted some mild exertional dyspnea, however.   The patient also refers to having had recent extensive workup for  leukopenia.  He is being followed by Dr. Arline Asp in [place].  He  reports having undergone a bone marrow, which was negative.   The patient has reported history of prior stroke.  There is some  question that he had a TIA earlier this year.  Carotid Dopplers in  March, however, showed no significant stenosis.  A 2-D echo was  performed at that time as well, reviewed by Dr. Jens Som, indicating EF  55-60%, with evidence of mild diastolic dysfunction; mild aortic  regurgitation with mild aortic root dilatation.  Of note, there was no  definite embolic source.   REVIEW OF SYSTEMS:  Denies PND, orthopnea, or significant lower  extremity edema.  Otherwise as noted per HPI, remaining systems  negative.   CURRENT MEDICATIONS:  1. Aspirin 81 daily.  2. Benazepril 2.5 daily.  3. Metformin 500 q.a.m./1000 q.p.m.  4. Doxazosin 4 daily.  5. Pravastatin 20 daily.  6. Aggrenox 25/200 b.i.d.  7. Finasteride 5 daily.  8. Januvia  100 daily.  9. Hydrochlorothiazide 25 daily.   PHYSICAL EXAMINATION:  VITAL SIGNS:  Blood pressure 120/65; pulse 76,  regular; weight 208.  GENERAL:  A 77 year old male sitting upright in no distress.  HEENT:  Normocephalic, atraumatic.  NECK:  Palpable carotid pulse without bruits; no JVD.  LUNGS:  Diminished breath sounds on the left, but otherwise clear  throughout.  HEART:  Regular rate and rhythm.  Occasional premature beat.  No  significant murmurs.  No rubs.  ABDOMEN:  Soft, nontender.  EXTREMITIES:  Palpable posterior tibialis pulses.  No significant edema.  NEURO:  Alert and oriented.   IMPRESSION:  1. Presumed coronary artery disease.      a.     Mildly abnormal perfusion imaging study, suggestive of       inferior wall ischemia; EF 65%, March 2006.  2. Normal LVF.      a.     Mild aortic regurgitation.  3. Mild aortic root dilatation.  4. Dyslipidemia.  5. Type 2 diabetes mellitus.  6. History of stroke.  7. Hypertension.  PLAN:  1. Continue current medication regimen.  2. Schedule return clinic followup with myself and Dr. Diona Browner in 6      months.      Rozell Searing, PA-C  Electronically Signed      Jonelle Sidle, MD  Electronically Signed   GS/MedQ  DD: 07/18/2009  DT: 07/18/2009  Job #: [id number]   cc:   Brett Canales A. Cleta Alberts, M.D.

## 2011-04-27 NOTE — Op Note (Signed)
[name], [name] NO.:  [id number]   MEDICAL RECORD NO.:  [id number]          PATIENT TYPE:  INP   LOCATION:  X005                         FACILITY:  [facility]   PHYSICIAN:  Ollen Gross, M.D.    DATE OF BIRTH:  08-06-1932   DATE OF PROCEDURE:  05/29/2007  DATE OF DISCHARGE:                               OPERATIVE REPORT   PREOPERATIVE DIAGNOSIS:  Osteoarthritis left hip.   POSTOP DIAGNOSIS:  Osteoarthritis left hip.   PROCEDURE:  Left total hip arthroplasty.   SURGEON:  Ollen Gross, M.D.   ASSISTANT:  Alexzandrew L. Perkins, P.A.C.   ANESTHESIA:  General.   ESTIMATED BLOOD LOSS:  400 mL.   DRAIN:  None.   COMPLICATIONS:  None.   CONDITION:  Stable to recovery.   BRIEF CLINICAL NOTE:  [name] is a 77 year old male with end-stage  arthritis of the left hip with progressively worsening pain and  dysfunction.  He presents now for left total hip arthroplasty.   PROCEDURE IN DETAIL:  After the successful administration of general  anesthetic, the patient is placed in the right lateral decubitus  position with the left side up and held with the hip positioner.  Left  lower extremity was isolated from his perineum with plastic drapes and  prepped and draped in the usual sterile fashion.  A short posterolateral  incision is made with a 10-blade through the subcutaneous tissue to the  level of the fascia lata which was incised in line with the skin  incision.  The sciatic nerve was palpated and then protected; and short  rotator was isolated off the femur.   A capsulectomy was performed and the hip was dislocated.  Center of the  femoral head was marked and a trial prosthesis was placed; such that the  center of the trial head corresponds to the center of his native femoral  head.  Osteotomy line was marked on the femoral neck; and osteotomy made  with an oscillating saw.  Femoral head was removed and then the femur  was retracted anteriorly to  gain acetabular exposure.   Acetabular retractors were placed and labrum and osteophytes removed.  Reaming started at 49 mm coursing in increments of 2-59 mm.  Then a 60-  mm pinnacle acetabular shell was placed in anatomic position and  transfixed with two dome screws.  Trial 36-mm, neutral, plus 4 liner was  placed.   Femur was prepared with the canal finder and irrigation.  Axial reaming  was performed to 15.5 mm proximal reaming to a 20-F, and the sleeve  machine to a large.  A 20-F large sleeve was placed with a 20 x 15 stem,  and a 36, plus 8 neck.  A 36 plus 0 head was placed.  With this there  was not enough offset so I went to 36 plus 12 which allowed for very  stable excellent reduction with full extension, full external rotation,  70 degrees of flexion, 40 degrees of adduction, 90 degrees of internal  rotation, 90 degrees of flexion, and 70 degrees of internal rotation.  By placing  the left leg on top of the right; the leg lengths are equal.   The hip was dislocated and all trials were removed.  The permanent apex  hole eliminator was placed into the acetabular shell.  Then the  permanent 36 mm, neutral, plus 4 marathon liner was placed.  On the  femoral side the 20-F large sleeve was placed with a 20 x 15 stem, 36  plus 12 neck, and matching native anteversion.  A 36 plus 0 head was  placed; and the hip was reduced with the same stability parameters.  The  fascia lata was closed with #1 interrupted Vicryl, subcu closed with #1  and then 2-0 Vicryl, and subcuticular running 4-0 Monocryl.  The  incision was cleaned and dried; and Steri-Strips and a bulky sterile  dressing applied.  He was then placed into a knee immobilizer, awakened,  and transported to recovery in stable condition.      Ollen Gross, M.D.  Electronically Signed     FA/MEDQ  D:  05/29/2007  T:  05/29/2007  Job:  [id number]

## 2011-04-27 NOTE — H&P (Signed)
[name], [name] NO.:  [id number]   MEDICAL RECORD NO.:  [id number]          PATIENT TYPE:  INP   LOCATION:  5501                         FACILITY:  [facility]   PHYSICIAN:  Santiago Bumpers. Hensel, M.D.DATE OF BIRTH:  [birth year]   DATE OF ADMISSION:  04/11/2009  DATE OF DISCHARGE:                              HISTORY & PHYSICAL   CHIEF COMPLAINT:  Seizure.   PRIMARY CARE PHYSICIAN:  Earl Lites, MD at [facility].   HISTORY OF PRESENT ILLNESS:  This is a 77 year old male with a past  medical history significant for 2 previous seizures (December 2009,  March 2010) presenting with 2 witnessed seizures at home today.  The  seizures happened at 3 p.m. and 9 p.m. today, both times while the  patient was sleeping.  The patient screams out in his sleep, awakes, but  is confused.  Seizures last 1-2 minutes per wife, but postictal state  lasts about 30 minutes.  No loss of bowel or bladder has noted and the  patient has bilateral upper extremities in flexure after the seizures.  During hospitalization for previous similar episode in March 2010, CT of  the head, carotid Dopplers, and echocardiograms were obtained and  unremarkable.  It was felt at that time that the patient may have had a  TIA, but was felt that further occurrences would warrant a workup with  EEG for seizure disorder.   REVIEW OF SYSTEMS:  Negative for headache, focal weakness, numbness,  chest pain, shortness of breath, abdominal pain, nausea, vomiting,  diarrhea, melena, hematochezia, fevers, and chills.   PAST MEDICAL HISTORY:  1. Seizure disorder with 2 previous seizures in December 2009 and      March 2010.  2. Hypertension.  3. Diabetes mellitus type 2.  4. Coronary artery disease.  5. TIA.  6. Leukopenia, for which he is followed by Hematology.  Negative      workup today per wife.   PAST SURGICAL HISTORY:  Left total hip replacement.   FAMILY HISTORY:  Mother is deceased from Alzheimer  disease.  Father is  deceased from Parkinson disease.  Two children are alive and healthy.  Sister is with unknown health status.   SOCIAL HISTORY:  The patient is married and lives in [place] with his  wife.  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  He denies alcohol,  tobacco, and illicit drug use.   ALLERGIES:  No known drug allergies.   MEDICATIONS:  1. Metformin 1000 mg p.o. b.i.d.  2. Pravastatin 20 mg p.o. daily.  3. Doxazosin 8 mg p.o. daily.  4. Hydrochlorothiazide 25 mg p.o. daily.  5. Benazepril 10 mg p.o. daily.  6. Aggrenox (200/25) 1 tablet p.o. daily.  7. Finasteride 5 mg p.o. daily.  8. Januvia 100 mg p.o. daily.   PHYSICAL EXAMINATION:  VITAL SIGNS:  Temperature 97.8, heart rate 76-81,  respirations 16-18, blood pressure 143-147/75-82, saturation 97%-100% on  3 L of oxygen.  GENERAL:  He is alert, oriented, and in no acute distress.  He is  sleepy.  CARDIAC  Regular rate and rhythm  with no murmurs, rubs, or gallops.  PULMONARY:  Clear to auscultation with normal work of breathing.  ABDOMEN:  Normoactive bowel sounds.  Soft, nontender, nondistended.  No  mass or hepatosplenomegaly.  EXTREMITIES:  Nontender.  No edema in the bilateral lower extremities.  NEUROLOGIC:  Cranial nerves II through XII are intact, 5/5 strength in  the bilateral upper extremities.  He moves his bilateral lower  extremities spontaneously.   LABORATORY DATA:  I-STAT chemistry shows a sodium of 135, potassium 4.0,  chloride 97, bicarb 25, BUN 13, creatinine 0.9, glucose 181, ionized  calcium 1.14.  CBC showed a white blood count of 2.2, hemoglobin 12.6,  hematocrit 36.8, platelets 210, absolute neutrophil count 0.9.  Urinalysis is within normal limits except for trace ketones.   ASSESSMENT/PLAN:  This is a 77 year old male with seizures.  1. Seizure.  No focal neurologic deficit at this time.  No imaging is      performed.  We will consult Neurology in the morning to see if the       patient requires inpatient or outpatient workup.  We will hold off      on imaging at this time as complete TIA workup was performed in      March 2010.  2. Diabetes mellitus.  We will hold metformin and Januvia.  Cover with      sensitive sliding scale insulin.  3. Hypertension.  Reasonably well controlled at this time.  Continue      home hydrochlorothiazide, but we will hold benazepril in case      contrast study will be performed.  4. Coronary artery disease.  Continue home Aggrenox and pravastatin.  5. Benign prostatic hypertrophy.  Continue home doxazosin and      finasteride.  6. Leukopenia.  Followed by Hematology.  No absolute neutropenia at      this time.  Negative workup per wife.  We will simply follow at      this time.  7. Fluids, electrolytes, nutrition/gastrointestinal.  Carbohydrate-      modified diet with maintenance IV fluids at one-half normal saline.      Maintenance fluids will be one-half normal saline at 125 mL per      hour.  Check BMET in the morning.  8. Prophylaxis.  Heparin and PPI.  9. Disposition.  Pending workup of seizure.      Romero Belling, MD  Electronically Signed      Santiago Bumpers. Leveda Anna, M.D.  Electronically Signed    MO/MEDQ  D:  04/11/2009  T:  04/11/2009  Job:  [id number]

## 2011-04-27 NOTE — Discharge Summary (Signed)
[name], [name] NO.:  [id number]   MEDICAL RECORD NO.:  [id number]          PATIENT TYPE:  INP   LOCATION:  3023                         FACILITY:  [facility]   PHYSICIAN:  [name], M.D.DATE OF BIRTH:  18-Jul-1932   DATE OF ADMISSION:  02/19/2009  DATE OF DISCHARGE:  02/20/2009                               DISCHARGE SUMMARY   PRIMARY CARE [name]:  [name] Canales A. Cleta Alberts, MD, [facility].   DISCHARGE DIAGNOSES:  1. Altered mental status.  2. History of transient ischemic attack.  3. Hypertension.  4. Diabetes mellitus.  5. Hyperlipidemia.  6. Coronary artery disease.  7. Benign prostatic hypertrophy.   DISCHARGE MEDICATIONS:  1. Aggrenox 25/200 mg 1 p.o. b.i.d.  2. Doxazosin 8 mg p.o. daily.  3. HCTZ 25 mg p.o. daily.  4. Januvia 100 mg p.o. daily.  5. Finasteride 5 mg p.o. daily.  6. Pravastatin 20 mg p.o. daily.  7. Metformin 1000 mg p.o. b.i.d.  8. Benazepril 10 mg p.o. daily.  9. Nitroglycerin 0.4 mg to use as directed for chest pain.   CONSULTS:  None.   PROCEDURES:  1. Carotid duplex.  Impression:  No evidence of significant stenosis      or plaques present.  2. Echocardiogram, pending.   LABORATORIES:  Hemoglobin A1c 7.5.  Cardiac enzymes negative x1.  Fasting lipid panel; total cholesterol 101, triglycerides 55, HDL 27,  and LDL 63.  CMET:  Sodium 132, potassium 3.9, BUN 17, and creatinine  1.07.  Liver function tests within normal limits.  Urinalysis negative.  Prolactin level 23.1.   IMAGING:  1. Chest x-ray stable chronic elevated left hemidiaphragm, no evidence      of acute process.  2. CT scan of head.  Impression:  CT scan without contrast, no acute      intracranial abnormality, chronic lacunar infarct at the interface      of the left thalamus and posterior limb of the left internal      capsule.   BRIEF HOSPITAL COURSE:  A 77 year old male with a history of coronary  artery disease and TIA, presented status post  episode of altered mental  status where the patient awoke from bed, screamed, and had episode of  confusion.  The patient was brought in via EMS, in which he had episode  of nausea during transportation, however, resolved prior to initial  evaluation in ED.  The patient was admitted for altered mental status,  was admitted to have TIA workup done.  1. Altered mental status.  The patient initially admitted with concern      for TIA with history.  Other differentials for altered mental      status include seizure activity, metabolic abnormality,      orthostasis, and sleep disorder.  The patient was admitted to      telemetry and did not have any arrhythmias during short admission      stay.  CT scan did not show any evidence of acute hemorrhage      suggesting acute stroke.  Carotid Dopplers did not have any  significant findings.  Echocardiogram was still pending at the time      of discharge.  Other differentials included infection, however,      there was no evidence of infection.  During initial exam, the      patient was afebrile without a white count on lab draw and chest x-      ray did not reveal any infiltration to suggest pneumonia or viral      process.  As for metabolic differentials, electrolytes were within      normal limits and glucose was within normal limits.  At the time of      discharge, the patient did not have any other episodes .  It is      unclear cause of the patient's episode which as reoccurred for the      second time in the past 3 months.  Primary team felt comfortable      sending the patient home as he had no focal neurological deficit      and mentation was appropriate.  It is unlikely that the patient had      seizure disorder as history and physical was not consistent with      seizure activity.  It is of note that primary team decided if the      patient were to have another episode, EEG would be done and      Neurology will be consulted. Primary  team also did not feel that      cerebrovascular event had occurred with negative imaging as well as      the patient's history and physical not consistent.  The patient did      not have any focal neurological deficits with episode at home.      Metabolic disorders were ruled out as above.  Other differentials      for this which cannot be ruled out at the time of discharge was      some form of sleep disorder or night terror.  Primary care [name]      may find sleep study or further evaluation of sleep disorders to      find cause of the patient's episodes, which have occurred during      sleep.  2. Hypertension.  The patient's blood pressure remained stable.  Of      note, the patient had recorded values from home over the past few      months.  Blood pressures during hospital stay were consistent with      home values.  The patient was continued on all home medications for      hypertension including HCTZ, doxazosin, and benazepril.  3. Diabetes mellitus.  The patient with history of diabetes mellitus.      Hemoglobin A1c was 7.5.  During admission, the patient was      continued on home medication of metformin and Januvia.  Sliding      scale insulin was also used.  Prior to admission, CBG was 136.  No      changes in diabetic regimen were done.  The patient will follow up      with primary care for any further changes in regimen.  4. Dyslipidemia.  The patient with history of coronary artery disease      and dyslipidemia, was continued on pravastatin.  5. BPH.  The patient has history of BPH, was continued on home      medications.   ISSUES FOR FOLLOWUP:  Echocardiogram results, further evaluation for a  sleep disorder.  Neurology referral, if the patient has repeat  occurrence.   DISCHARGE INSTRUCTIONS:  Heart-healthy diet.  The patient is to return  if he has another episode causing any focal neurological deficits  including slurred speech, weakness, or to return for chest  pain and  difficulty breathing.   FOLLOWUP APPOINTMENTS:  The patient is to follow up with Dr. [name] next week.  The patient is to  call for appointment.   The patient may follow up with Dr. Nona Dell, primary cardiologist  as previously scheduled.   DISCHARGE CONDITION:  Stable/improved.   DISCHARGE LOCATION:  Home.      Milinda Antis, MD  Electronically Signed      [name] Bumpers. Leveda Anna, M.D.  Electronically Signed    KD/MEDQ  D:  02/20/2009  T:  02/21/2009  Job:  [id number]   cc:   [name], M.D.  Jonelle Sidle, MD

## 2011-04-27 NOTE — H&P (Signed)
[name], [name] NO.:  [id number]   MEDICAL RECORD NO.:  [id number]          PATIENT TYPE:  INP   LOCATION:  NA                           FACILITY:  [facility]   PHYSICIAN:  Ollen Gross, M.D.    DATE OF BIRTH:  [birth year]   DATE OF ADMISSION:  DATE OF DISCHARGE:                              HISTORY & PHYSICAL   DATE OF OFFICE VISIT HISTORY AND PHYSICAL:  May 25, 2007   CHIEF COMPLAINT:  Left hip pain.   HISTORY OF PRESENT ILLNESS:  Patient is a 39 male who has been seen by  Dr. Lequita Halt for ongoing left hip pain.  He has had increasing pain over  the past several months and has been quite debilitating; it is  interfering with his activities including golf; it is limiting his  motion.  He is seen in the office where he is found to have severe end-  stage arthritis of the left hip with bone-on-bone and some flattening of  the femoral head with some osteophyte formation at the head and neck  junction.  It is felt it is advanced enough that the would benefit from  undergoing a hip replacement.  Risks and benefits have been discussed  and he elects to proceed with surgery.  He has been seen preoperatively  by Dr. Nona Dell and it is felt that his perioperative cardiac  risk is not high and think it is not unreasonable for him to proceed  with surgery and recommended perioperative beta blocker, on which he has  been placed.   ALLERGIES:  NO KNOWN DRUG ALLERGIES.   CURRENT MEDICATIONS:  Aggrenox, benazepril Crestor, doxazosin,  finasteride, __________, hydrochlorothiazide, Januvia, metformin and  most recently, metoprolol.   PAST MEDICAL HISTORY:  1. History of minor stroke.  2. Non-insulin-dependent diabetes mellitus.  3. Hypertension.  4. Hypercholesterolemia.  5. Benign prostatic hypertrophy.  6. Past history of a pneumothorax during the service many years ago.   PAST SURGICAL HISTORY:  1. Appendectomy.  2. Vasectomy.   FAMILY HISTORY:  Mother  deceased at 68 with Alzheimer's.  Father  deceased at 52 with dementia.   SOCIAL HISTORY:  Married, retired from [employer], nonsmoker,  positive intake of alcohol.  Two children.   REVIEW OF SYSTEMS:  GENERAL:  No fevers, chills or night sweats.  NEURO:  No seizures, syncope or paralysis.  RESPIRATORY:  No shortness of  breath, productive cough or hemoptysis.  CARDIOVASCULAR:  No chest pain,  angina or orthopnea.  GI:  No nausea, vomiting, diarrhea or  constipation.  GU:  No dysuria, hematuria or discharge.  MUSCULOSKELETAL:  Left hip.   PHYSICAL EXAMINATION:  VITAL SIGNS:  Pulse 54, respirations 12, blood  pressure 116/58.  GENERAL:  [age]-year-old, tall-framed white male individual,  well-nourished and well-developed, in no acute distress.  He is alert,  oriented and cooperative, a good historian.  HEENT:  Normocephalic, atraumatic.  Pupils are round and reactive.  Oropharynx clear.  EOMs intact.  NECK:  Supple.  CHEST:  Clear.  HEART:  Regular rate and rhythm.  No murmur.  S1 and S2 noted.  ABDOMEN:  Soft, flat and nontender.  Bowel sounds are present.  RECTAL, BREAST AND GENITALIA:  Not done, not pertinent to present  illness.  EXTREMITIES:  Left hip:  The left hip shows flexion to 90 degrees, 0  internal rotation, 10 degrees of external rotation, 10 degrees of  abduction, antalgic gait.   IMPRESSION:  1. Osteoarthritis, left hip.  2. History of minor stroke several years ago.  3. Non-insulin-dependent diabetes mellitus.  4. Hypertension.  5. Hypercholesterolemia.  6. Benign prostatic hypertrophy.   PLAN:  The patient was admitted to [facility] to undergo a  left total replacement arthroplasty.  Surgery will be performed by Dr.  Ollen Gross.  His medical physician is Dr. Ricki Miller.  His cardiologist is  Dr. Nona Dell.  Both will be notified of the room number on  admission and will be consulted if needed for medical assistance with  this  patient throughout the hospital course.      Alexzandrew L. Perkins, P.A.C.      Ollen Gross, M.D.  Electronically Signed    ALP/MEDQ  D:  05/28/2007  T:  05/29/2007  Job:  [id number]   cc:   Jonelle Sidle, MD  [address]  [place], [place] [address]   Juline Patch, M.D.  Fax: [phone]

## 2011-04-27 NOTE — Consult Note (Signed)
[name], HEGNER NO.:  [id number]   MEDICAL RECORD NO.:  [id number]          PATIENT TYPE:  INP   LOCATION:  5501                         FACILITY:  [facility]   PHYSICIAN:  Levert Feinstein, MD          DATE OF BIRTH:  1932/02/17   DATE OF CONSULTATION:  DATE OF DISCHARGE:  04/11/2009                                 CONSULTATION   CHIEF COMPLAINT:  Seizure.   HISTORY OF PRESENT ILLNESS:  This is a 77 year old male with a history  of 2 previous seizures in December 2009 and March 2010, who at this time  present with 2 weakness seizures by wife.  The patient has also had a  history of CVA in 2004 which was in the left internal capsule.  Apparently, the seizures that he has suffered in the previous time were  tonic-clonic and occurred in his sleep.  The seizures that occurred at  this date again while he was sleeping involved his upper and lower  extremity incontinence and tongue biting.  Apparently, the patient's  wife was in the other room at approximately 3:30 on April 10, 2009.  Her  husband had told he was going to lay down for a nap in the living room.  Shortly after the patient had fallen asleep, his wife heard a scream  coming from the room where her husband was in.  When the wife went to  check on her husband, she noted he had arms flexed, legs extended, and  was shaking vigorously for approximately 1-3 minutes.  At that time, the  patient did have positive incontinence.  After the patient had stopped  tonic-clonic activity, he was postictal and confused for approximately  45 minutes.  After the patient had recovered from his initial seizure,  according to his wife he had does outside, he had some mailed and walked  around the yard without any difficulty.  That same night at  approximately 9:30 the patient went to bed and again wife heard a scream  coming from the bedroom.  On checking on her husband, again she noticed  he had arms flexed, legs extended, and  shaking vigorously.  This lasted  for approximately 3 minutes with a second, 45 to 1-hour minute postictal  phase.  It was at that time the wife decided to bring him to [facility]  Emergency Department.  The patient has never been followed by a  neurologist for his seizure activity nor has he received an EEG.  Last  MRI obtained was in 2004 which showed the left lacunar infarct.  At  present time, the patient is alert, he is oriented, he is in no  distress.   PAST SURGICAL HISTORY:  Left total hip arthroplasty in 2008, vasectomy,  tonsillectomy.   PAST MEDICAL HISTORY:  Hypertension, diabetes, hypercholesterolemia, CVA  in 2004 in the left internal capsule, BPH, pneumothorax in the past, and  leukopenia who has been followed by Hematology with a negative workup  today.   MEDICATIONS:  1. Aggrenox 1 cap b.i.d.  2. Cardura 8 mg p.o. daily.  3. Proscar 5 mg daily.  4. Hydrochlorothiazide 25 mg daily.  5. Sliding scale insulin.  6. Zocor 10 mg p.o. q.18 hours.   ALLERGIES:  Negative.   SOCIAL HISTORY:  The patient does not smoke, drink, or do illicit drugs.  Lives with his wife.  He is a former Solicitor.  He has 2  children.   Review of systems are all negative with the exception of above.   PHYSICAL EXAMINATION:  VITAL SIGNS:  Blood pressure 118/67, pulse 70,  respiratory rate 16, temperature 98.1.  GENERAL:  The patient is in no apparent distress, answers appropriately,  follows commands without any difficulty.  CARDIOVASCULAR:  S1 and S2.  Regular rate and rhythm.  No murmurs or  gallops.  NECK:  Negative for bruits and supple.  PULMONARY:  Clear to auscultation bilaterally.  No rhonchi or wheezing.  ABDOMEN:  Soft, nontender, nondistended.  Bowel sounds in all 4  quadrants.  NEUROLOGIC:  The patient is alert.  He is oriented x3.  Carries out 2-  and 3-step commands without any difficulty.  Cranial nerves; pupils are  equal, round, reactive, accommodating to  light.  Conjugate gaze.  Extraocular muscles are intact.  Visual fields are intact.  Face  symmetrical.  Tongue is midline.  Uvula is midline.  Sensation of  trigeminal nerve V1 through V3 are fully intact.  Shoulder shrug and  head turn is fully intact.  Coordination cerebellar, finger-to-nose is  smooth, heel-to-shin smooth.  Fine motor movements within normal limits.  Gait is within normal limits with no ataxia or wide-based shuffling.  Motor:  The patient has 5/5 strength globally.  He has no tremor,  asterixis, or fasciculations.  Tone and bulk are within normal limits.  Deep tendon reflexes are 2+ throughout with bilateral downgoing toes.  The patient has no drift in the upper or lower extremities.  Sensation:  Sensation is full to pinprick, light touch, vibration.  Stereognosis and  proprioception are fully intact.   LABORATORY DATA:  UA is negative.  Sodium 135, potassium 3.9, chloride  97, CO2 is 28, BUN 9, creatinine 0.89, glucose is 145.  White blood cell  count is low at 2.0, hemoglobin and hematocrit 11.6 and 33.0, platelets  194.   IMAGING TEST:  MRI pending.  EEG is pending.   ASSESSMENT:  A 77 year old with recurrent episodes of tonic-clonic  seizures.  At this time, we will initiate Keppra 500 mg b.i.d.  We will  obtain an EEG and MRI with and without contrast.   Discussion of no driving for the next 6 months has been discussed in  full with the patient.  We will continue to follow the patient while he  is in-house.  We would like the patient to follow up with Dr. Terrace Arabia  outpatient in approximately 4 weeks postdischarge.     ______________________________  Felicie Morn, PA-C      Levert Feinstein, MD  Electronically Signed    DS/MEDQ  D:  04/11/2009  T:  04/12/2009  Job:  [id number]   cc:   William A. Leveda Anna, M.D.

## 2011-04-27 NOTE — Discharge Summary (Signed)
NAMEJAZION, [name] NO.:  [id number]   MEDICAL RECORD NO.:  [id number]          PATIENT TYPE:  INP   LOCATION:  1617                         FACILITY:  [facility]   PHYSICIAN:  Ollen Gross, M.D.    DATE OF BIRTH:  February 11, 1932   DATE OF ADMISSION:  05/29/2007  DATE OF DISCHARGE:  06/02/2007                               DISCHARGE SUMMARY   ADMISSION DIAGNOSES:  1. Osteoarthritis, left hip.  2. History of minor stroke several years ago.  3. Non-insulin-dependent diabetes mellitus.  4. Hypertension.  5. Hypercholesterolemia.  6. Benign prostatic hypertrophy.   DISCHARGE DIAGNOSES:  1. Osteoarthritis, left hip, status post left total hip replacement      arthroplasty.  2. Postoperative blood loss anemia.  3. Status post transfusion without sequelae.  4. Mild postoperative hyponatremia.  5. History of minor stroke several years ago.  6. Non-insulin-dependent diabetes mellitus.  7. Hypertension.  8. Hypercholesterolemia.  9. Benign prostatic hypertrophy.   PROCEDURE:  May 29, 2007, left total hip.  Surgeon:  Ollen Gross,  M.D.  Assistant:  Alexzandrew L. Perkins, PA-C.  Anesthesia:  General.   CONSULTATIONS:  None.   BRIEF HISTORY:  [name] is a 77 year old male with end-stage  arthritis of the left hip with progressive worsening pain and  dysfunction, who now presents for a total hip arthroplasty.   LABORATORY DATA:  CBC on admission:  Hemoglobin 14.3, hematocrit of  42.4, white cell count 6.0.  Postop hemoglobin down to 9, drifted down  to 8.4, was given 2 units blood, post-transfusion hemoglobin back up to  9.9 with a hematocrit 28.0.  PT/PTT on admission 13.8 and 35,  respectively, and INR 1.0.  Serial pro times followed, last noted PT/INR  26.4 and 2.3.  Chemistry panel on admission:  Slightly elevated glucose  of 175.  He is a known diabetic.  Serial BMETs were followed.  Sodium  did drop from 136 to 134 and then to 132, back up to 134, last  noted a  132.  Remaining electrolytes remained within normal limits.  Preop UA  negative.  Blood group type O+.  X-rays:  Portable pelvis and hip film  on May 29, 2007:  Satisfactory left hip replacement.   HOSPITAL COURSE:  The patient admitted to [facility],  tolerated the procedure well, later transferred to the recovery room and  then the orthopedic floor, given PCA and p.o. analgesics for pain  control following surgery.  Started on partial weightbearing to the left  lower extremity.  On postop day #1 he was doing fairly well, had a rough  night, though, they could not regulate the temperature in the room so he  did not rest well.  Pain was doing fairly well, though.  He had a little  bit of low urinary output, so we gave him some gentle fluid boluses just  to help out.  We added iron because his hemoglobin was down to 9.9,  discontinued his PCA and started him on Reglan to help out with his  bowels.  He was placed on sliding scale and we  resumed his oral  hypoglycemics.  By day #2 he was doing a little bit better.  Unfortunately, his hemoglobin dropped down to 8.4 and he was starting to  have some symptoms of the anemia.  Therefore, we transfused him with 2 2  units of blood.  He tolerated the blood well and the hemoglobin came  back up on postop day #3 up to 9.9.  he was starting to get up out of  bed a little bit more.  He was slowly progressing with physical therapy  and needed to do a little bit more mobility.  On day #3 he was still  requiring moderate assist for transfers.  He was ambulating 50 feet.  He  just needed a little bit more therapy.  He started getting a little bit  of results with his movements.  Incision was changed on day #2,  followed.  He had a little bit of bruising posteriorly but otherwise the  incision was healing well.  Kept him another day and on June 02, 2007,  the patient was doing very well with therapies progressing, tolerating   medications, and was discharged home.   DISCHARGE/PLAN:  1. The patient is discharged home on June 02, 2007.  2. Discharge diagnoses:  Please see above.  3. Discharge medications:  Percocet, Robaxin, Coumadin, Nu-Iron.  4. Diet:  As tolerated.  5. Activities:  Partial weightbearing 25-50%, PT and home health      nursing.  Hip precautions, total protocol.  6. Follow-up on Wednesday July 2, call for appointment.   DISPOSITION:  Home.   CONDITION UPON DISCHARGE:  Improved.      Alexzandrew L. Perkins, P.A.C.      Ollen Gross, M.D.  Electronically Signed    ALP/MEDQ  D:  06/02/2007  T:  06/02/2007  Job:  [id number]   cc:   Jonelle Sidle, MD  [address]  [place], [place] [address]   Juline Patch, M.D.  Fax: [phone]

## 2011-04-27 NOTE — Assessment & Plan Note (Signed)
[facility]                            CARDIOLOGY OFFICE NOTE   LYRICK, LAGRAND                        MRN:          [id number]  DATE:11/15/2007                            DOB:          05/24/1932    PRIMARY CARE PHYSICIAN:  Dr. Lucilla Edin.   REASON FOR VISIT:  Cardiac followup.   HISTORY OF PRESENT ILLNESS:  [name] reports doing well, without any  significant angina.  He had a left hip replacement surgery back in June  and tolerated this well.  He states that he is back out on the golf  course now.  His medications are outlined below.  I did prescribe him a  low-dose beta blocker around the time of his surgery, although he is not  on this chronically.   His electrocardiogram today is stable, showing sinus rhythm with  decreased septal Q-waves and no marked changes, compared to his previous  tracing.   ALLERGIES:  No known drug allergies.   PRESENT MEDICATIONS:  1. Proscar 5 mg p.o. daily.  2. Doxazosin 8 mg p.o. daily.  3. Aggrenox 200/25 mg p.o. twice daily.  4. Metformin 500 mg p.o. twice daily.  5. Benazepril 10 mg p.o. daily.  6. Januvia 100 mg p.o. daily.  7. Pravastatin 40 mg p.o. every other day.  8. Hydrochlorothiazide 25 mg p.o. daily.   REVIEW OF SYSTEMS:  As described in the history of present illness.   PHYSICAL EXAMINATION:  VITAL SIGNS:  Blood pressure 143/74, heart rate  69, weight 224 pounds.  GENERAL:  The patient is comfortable, and in no acute distress.  NECK:  No elevated jugular venous pressure.  No loud bruits.  LUNGS:  Clear.  HEART:  A regular rate and rhythm.  No loud murmur or gallop.  EXTREMITIES:  No pitting edema.   IMPRESSION/RECOMMENDATIONS:  Presumed underlying cardiovascular disease,  based on mildly abnormal exercise Myoview, in the absence of any active  angina.  Our plan is to continue medical therapy and observation.  Mr.  Vane states that he is back to playing golf following his left  hip  replacement back in June.   FOLLOWUP:  I would like to see him back over the next six months.  Otherwise continue regular visits with Dr. Cleta Alberts.     Jonelle Sidle, MD  Electronically Signed    SGM/MedQ  DD: 11/15/2007  DT: 11/15/2007  Job #: [id number]   cc:   Brett Canales A. Cleta Alberts, M.D.

## 2011-04-27 NOTE — Assessment & Plan Note (Signed)
[facility]                          [facility]ARDI[facility] OFFICE NOTE   RICCARDO, HOLEMAN                        MRN:          [id number]  DATE:12/17/2008                            DOB:          09-19-1932    PRIMARY CARE PHYSICIAN:  Brett Canales A. Cleta Alberts, MD   REASON FOR VISIT:  Routine cardiac followup.   HISTORY OF PRESENT ILLNESS:  I saw Mr. Mccrae back in June 2009.  He  reports being overall stable, although we did have a visit to the  emergency department at [facility] back in December.  Records indicate  that he was noted to cry out in his sleep and his wife subsequently had  difficulty of awakening him.  Subsequently, he did wake up after he was  already in the ambulance.  It was reported that his vital signs and  glucose were normal and his rhythm was normal as well.  He had no frank  headache or chest pain at that time and a CT scan of his head done,  which showed no acute intracranial abnormalities with evidence of a  small old left thalamic infarct.  Lab work showed a normal CBC and  essentially normal chemistries.  His troponin I was less than 0.05 and  his urinalysis was normal.  He has had no recurrence of this.  He  otherwise denies any history of sleep disturbances.  He does have a  prior history of stroke.  He has had no new deficits.  Mr. Vilar  denies any angina or limiting breathlessness.   ALLERGIES:  No known drug allergies.   PRESENT MEDICATIONS:  1. Aggrenox 25/200 mg p.o. b.i.d.  2. Doxazosin 8 mg p.o. daily.  3. Hydrochlorothiazide 25 mg b.i.d.  4. Januvia 100 mg p.o. daily.  5. Finasteride 5 mg p.o. daily.  6. Pravastatin 20 mg p.o. daily.  7. Metformin 500 mg p.o. b.i.d.  8. Benazepril 10 mg p.o. daily.  9. Sublingual nitroglycerin 0.4 mg p.r.n.   REVIEW OF SYSTEMS:  As described in the history of present illness,  otherwise negative.   PHYSICAL EXAMINATION:  VITAL SIGNS:  Blood pressure 140/80 and heart  rate is 71.   Weight is 220 pounds.  GENERAL:  This is an overweight male, in no acute distress.  NECK:  No elevated jugular venous pressure.  No loud bruits.  LUNGS:  Clear without breathing at rest.  CARDIAC:  Regular rate and rhythm.  No loud murmur or S3 gallop.  ABDOMEN:  Soft, nontender, normoactive bowel sounds.  EXTREMITIES:  No pitting edema.   IMPRESSION/RECOMMENDATIONS:  Presumed underlying cardiovascular disease  based on previous abnormal screening Myoview.  Mr. Beazer is not having  any active angina or progressive breathlessness.  We will plan to  continue medical therapy and observation.  He continues to play golf  regularly, although none in the last few weeks with a recent cold  weather.  I will plan to see him back over the next 6 months.     Jonelle Sidle, MD  Electronically Signed    SGM/MedQ  DD: 12/17/2008  DT: 12/17/2008  Job #: [id number]   cc:   Brett Canales A. Cleta Alberts, M.D.

## 2011-04-29 ENCOUNTER — Inpatient Hospital Stay (HOSPITAL_COMMUNITY): Payer: Medicare Other

## 2011-04-29 ENCOUNTER — Emergency Department (HOSPITAL_COMMUNITY): Payer: Medicare Other

## 2011-04-29 ENCOUNTER — Encounter (HOSPITAL_COMMUNITY): Payer: Self-pay

## 2011-04-29 ENCOUNTER — Inpatient Hospital Stay (HOSPITAL_COMMUNITY)
Admission: EM | Admit: 2011-04-29 | Discharge: 2011-05-05 | DRG: 872 | Disposition: A | Discharge status: Home Health | Payer: Medicare Other | Attending: Family Medicine | Admitting: Family Medicine

## 2011-04-29 DIAGNOSIS — I2510 Atherosclerotic heart disease of native coronary artery without angina pectoris: Secondary | ICD-10-CM | POA: Diagnosis present

## 2011-04-29 DIAGNOSIS — E119 Type 2 diabetes mellitus without complications: Secondary | ICD-10-CM | POA: Diagnosis present

## 2011-04-29 DIAGNOSIS — A0472 Enterocolitis due to Clostridium difficile, not specified as recurrent: Secondary | ICD-10-CM | POA: Diagnosis present

## 2011-04-29 DIAGNOSIS — Z85038 Personal history of other malignant neoplasm of large intestine: Secondary | ICD-10-CM

## 2011-04-29 DIAGNOSIS — E876 Hypokalemia: Secondary | ICD-10-CM | POA: Diagnosis present

## 2011-04-29 DIAGNOSIS — I10 Essential (primary) hypertension: Secondary | ICD-10-CM | POA: Diagnosis present

## 2011-04-29 DIAGNOSIS — E871 Hypo-osmolality and hyponatremia: Secondary | ICD-10-CM | POA: Diagnosis present

## 2011-04-29 DIAGNOSIS — Z8673 Personal history of transient ischemic attack (TIA), and cerebral infarction without residual deficits: Secondary | ICD-10-CM

## 2011-04-29 DIAGNOSIS — Z96649 Presence of unspecified artificial hip joint: Secondary | ICD-10-CM

## 2011-04-29 DIAGNOSIS — R5081 Fever presenting with conditions classified elsewhere: Secondary | ICD-10-CM | POA: Diagnosis present

## 2011-04-29 DIAGNOSIS — R197 Diarrhea, unspecified: Secondary | ICD-10-CM | POA: Diagnosis present

## 2011-04-29 DIAGNOSIS — I519 Heart disease, unspecified: Secondary | ICD-10-CM | POA: Diagnosis present

## 2011-04-29 DIAGNOSIS — M359 Systemic involvement of connective tissue, unspecified: Secondary | ICD-10-CM | POA: Diagnosis present

## 2011-04-29 DIAGNOSIS — A419 Sepsis, unspecified organism: Principal | ICD-10-CM | POA: Diagnosis present

## 2011-04-29 DIAGNOSIS — D708 Other neutropenia: Secondary | ICD-10-CM | POA: Diagnosis present

## 2011-04-29 LAB — URINALYSIS, ROUTINE W REFLEX MICROSCOPIC
Glucose, UA: NEGATIVE mg/dL
Hgb urine dipstick: NEGATIVE
Ketones, ur: NEGATIVE mg/dL
pH: 6 (ref 5.0–8.0)

## 2011-04-29 LAB — CBC
HCT: 27.7 % — ABNORMAL LOW (ref 39.0–52.0)
Hemoglobin: 9.3 g/dL — ABNORMAL LOW (ref 13.0–17.0)
MCV: 81.5 fL (ref 78.0–100.0)
WBC: 2.5 10*3/uL — ABNORMAL LOW (ref 4.0–10.5)

## 2011-04-29 LAB — DIFFERENTIAL
Basophils Absolute: 0 10*3/uL (ref 0.0–0.1)
Eosinophils Relative: 1 % (ref 0–5)
Lymphocytes Relative: 29 % (ref 12–46)
Monocytes Relative: 23 % — ABNORMAL HIGH (ref 3–12)
WBC Morphology: INCREASED

## 2011-04-29 LAB — COMPREHENSIVE METABOLIC PANEL
ALT: 12 U/L (ref 0–53)
ALT: 14 U/L (ref 0–53)
AST: 18 U/L (ref 0–37)
Alkaline Phosphatase: 76 U/L (ref 39–117)
CO2: 28 mEq/L (ref 19–32)
CO2: 24 mEq/L (ref 19–32)
Calcium: 7.1 mg/dL — ABNORMAL LOW (ref 8.4–10.5)
Chloride: 91 mEq/L — ABNORMAL LOW (ref 96–112)
GFR calc Af Amer: >60 mL/min (ref >60)
GFR calc non Af Amer: 58 mL/min — ABNORMAL LOW (ref >60)
GFR calc non Af Amer: >60 mL/min (ref >60)
Glucose, Bld: 174 mg/dL — ABNORMAL HIGH (ref 70–99)
Potassium: 3.1 mEq/L — ABNORMAL LOW (ref 3.5–5.1)
Potassium: 3.6 mEq/L (ref 3.5–5.1)
Sodium: 128 mEq/L — ABNORMAL LOW (ref 135–145)
Sodium: 135 mEq/L (ref 135–145)
Total Bilirubin: 0.2 mg/dL — ABNORMAL LOW (ref 0.3–1.2)

## 2011-04-29 LAB — TSH: TSH: 1.209 u[IU]/mL (ref 0.350–4.500)

## 2011-04-29 LAB — GLUCOSE, CAPILLARY
Glucose-Capillary: 150 mg/dL — ABNORMAL HIGH (ref 70–99)
Glucose-Capillary: 175 mg/dL — ABNORMAL HIGH (ref 70–99)

## 2011-04-29 LAB — LACTIC ACID, PLASMA: Lactic Acid, Venous: 2.4 mmol/L — ABNORMAL HIGH (ref 0.5–2.2)

## 2011-04-29 LAB — CORTISOL: Cortisol, Plasma: 5.9 ug/dL

## 2011-04-29 MED ORDER — IOHEXOL 300 MG/ML  SOLN
100.0000 mL | Freq: Once | INTRAMUSCULAR | Status: AC | PRN
  Administered 2011-04-29: 100 mL via INTRAVENOUS

## 2011-04-30 LAB — GLUCOSE, CAPILLARY
Glucose-Capillary: 169 mg/dL — ABNORMAL HIGH (ref 70–99)
Glucose-Capillary: 203 mg/dL — ABNORMAL HIGH (ref 70–99)
Glucose-Capillary: 171 mg/dL — ABNORMAL HIGH (ref 70–99)

## 2011-04-30 LAB — BASIC METABOLIC PANEL
BUN: 14 mg/dL (ref 6–23)
Chloride: 104 mEq/L (ref 96–112)
Glucose, Bld: 137 mg/dL — ABNORMAL HIGH (ref 70–99)
Potassium: 3.8 mEq/L (ref 3.5–5.1)

## 2011-04-30 LAB — CBC
HCT: 23.9 % — ABNORMAL LOW (ref 39.0–52.0)
MCV: 83 fL (ref 78.0–100.0)
RBC: 2.88 MIL/uL — ABNORMAL LOW (ref 4.22–5.81)
WBC: 1.6 10*3/uL — ABNORMAL LOW (ref 4.0–10.5)

## 2011-04-30 LAB — URINE CULTURE: Culture  Setup Time: 201205170428

## 2011-05-01 ENCOUNTER — Inpatient Hospital Stay (HOSPITAL_COMMUNITY): Payer: Medicare Other

## 2011-05-01 DIAGNOSIS — D709 Neutropenia, unspecified: Secondary | ICD-10-CM

## 2011-05-01 DIAGNOSIS — R509 Fever, unspecified: Secondary | ICD-10-CM

## 2011-05-01 LAB — BASIC METABOLIC PANEL
Calcium: 8.1 mg/dL — ABNORMAL LOW (ref 8.4–10.5)
GFR calc Af Amer: >60 mL/min (ref >60)
GFR calc non Af Amer: >60 mL/min (ref >60)
Glucose, Bld: 134 mg/dL — ABNORMAL HIGH (ref 70–99)
Potassium: 4.5 mEq/L (ref 3.5–5.1)
Sodium: 137 mEq/L (ref 135–145)

## 2011-05-01 LAB — URINALYSIS, ROUTINE W REFLEX MICROSCOPIC
Bilirubin Urine: NEGATIVE
Nitrite: NEGATIVE
Protein, ur: NEGATIVE mg/dL
Urobilinogen, UA: 0.2 mg/dL (ref 0.0–1.0)

## 2011-05-01 LAB — CARDIAC PANEL(CRET KIN+CKTOT+MB+TROPI)
CK, MB: 1.5 ng/mL (ref 0.3–4.0)
Relative Index: INVALID (ref 0.0–2.5)
Total CK: 35 U/L (ref 7–232)
Total CK: 30 U/L (ref 7–232)

## 2011-05-01 LAB — URINE MICROSCOPIC-ADD ON

## 2011-05-01 LAB — GLUCOSE, CAPILLARY
Glucose-Capillary: 123 mg/dL — ABNORMAL HIGH (ref 70–99)
Glucose-Capillary: 137 mg/dL — ABNORMAL HIGH (ref 70–99)

## 2011-05-01 LAB — D-DIMER, QUANTITATIVE: D-Dimer, Quant: 1.46 ug/mL-FEU — ABNORMAL HIGH (ref 0.00–0.48)

## 2011-05-01 LAB — MAGNESIUM: Magnesium: 1.9 mg/dL (ref 1.5–2.5)

## 2011-05-01 LAB — CBC
HCT: 28 % — ABNORMAL LOW (ref 39.0–52.0)
MCHC: 32.5 g/dL (ref 30.0–36.0)
Platelets: 249 10*3/uL (ref 150–400)
RDW: 13.4 % (ref 11.5–15.5)
WBC: 1.1 10*3/uL — CL (ref 4.0–10.5)

## 2011-05-01 MED ORDER — IOHEXOL 300 MG/ML  SOLN
125.0000 mL | Freq: Once | INTRAMUSCULAR | Status: AC | PRN
  Administered 2011-05-01: 100 mL via INTRAVENOUS

## 2011-05-02 LAB — FERRITIN: Ferritin: 69 ng/mL (ref 22–322)

## 2011-05-02 LAB — BASIC METABOLIC PANEL
Calcium: 8.2 mg/dL — ABNORMAL LOW (ref 8.4–10.5)
GFR calc Af Amer: >60 mL/min (ref >60)
GFR calc non Af Amer: >60 mL/min (ref >60)
Potassium: 4.1 mEq/L (ref 3.5–5.1)
Sodium: 135 mEq/L (ref 135–145)

## 2011-05-02 LAB — POCT OCCULT BLOOD STOOL (DEVICE): Fecal Occult Bld: POSITIVE

## 2011-05-02 LAB — PRO B NATRIURETIC PEPTIDE: Pro B Natriuretic peptide (BNP): 3930 pg/mL — ABNORMAL HIGH (ref 0–450)

## 2011-05-02 LAB — URINE CULTURE
Colony Count: NO GROWTH
Culture  Setup Time: 201205192059

## 2011-05-02 LAB — GLUCOSE, CAPILLARY
Glucose-Capillary: 132 mg/dL — ABNORMAL HIGH (ref 70–99)
Glucose-Capillary: 106 mg/dL — ABNORMAL HIGH (ref 70–99)
Glucose-Capillary: 128 mg/dL — ABNORMAL HIGH (ref 70–99)
Glucose-Capillary: 144 mg/dL — ABNORMAL HIGH (ref 70–99)

## 2011-05-02 LAB — CARDIAC PANEL(CRET KIN+CKTOT+MB+TROPI)
CK, MB: 1.6 ng/mL (ref 0.3–4.0)
Relative Index: INVALID (ref 0.0–2.5)
Total CK: 26 U/L (ref 7–232)
Troponin I: <0.3 ng/mL (ref <0.30)
Troponin I: <0.3 ng/mL (ref <0.30)

## 2011-05-02 LAB — DIFFERENTIAL
Basophils Relative: 1 % (ref 0–1)
Eosinophils Absolute: 0.1 10*3/uL (ref 0.0–0.7)
Monocytes Absolute: 0.5 10*3/uL (ref 0.1–1.0)
Neutro Abs: 0.5 10*3/uL — ABNORMAL LOW (ref 1.7–7.7)

## 2011-05-02 LAB — CBC
MCH: 26.8 pg (ref 26.0–34.0)
MCHC: 32.5 g/dL (ref 30.0–36.0)
Platelets: 196 10*3/uL (ref 150–400)
RDW: 13.4 % (ref 11.5–15.5)

## 2011-05-03 DIAGNOSIS — R509 Fever, unspecified: Secondary | ICD-10-CM

## 2011-05-03 DIAGNOSIS — D709 Neutropenia, unspecified: Secondary | ICD-10-CM

## 2011-05-03 LAB — CBC
MCH: 27.2 pg (ref 26.0–34.0)
Platelets: 229 10*3/uL (ref 150–400)
RBC: 3.64 MIL/uL — ABNORMAL LOW (ref 4.22–5.81)
RDW: 13.3 % (ref 11.5–15.5)

## 2011-05-03 LAB — GLUCOSE, CAPILLARY
Glucose-Capillary: 109 mg/dL — ABNORMAL HIGH (ref 70–99)
Glucose-Capillary: 112 mg/dL — ABNORMAL HIGH (ref 70–99)
Glucose-Capillary: 143 mg/dL — ABNORMAL HIGH (ref 70–99)

## 2011-05-03 LAB — DIFFERENTIAL
Basophils Absolute: 0 10*3/uL (ref 0.0–0.1)
Eosinophils Absolute: 0.2 10*3/uL (ref 0.0–0.7)
Lymphocytes Relative: 30 % (ref 12–46)
Lymphs Abs: 1.2 10*3/uL (ref 0.7–4.0)
Monocytes Relative: 18 % — ABNORMAL HIGH (ref 3–12)

## 2011-05-03 LAB — BASIC METABOLIC PANEL
BUN: 7 mg/dL (ref 6–23)
Calcium: 8.4 mg/dL (ref 8.4–10.5)
Chloride: 103 mEq/L (ref 96–112)
Creatinine, Ser: 1.16 mg/dL (ref 0.4–1.5)
GFR calc Af Amer: >60 mL/min (ref >60)
GFR calc non Af Amer: >60 mL/min (ref >60)

## 2011-05-03 LAB — CROSSMATCH

## 2011-05-04 LAB — BASIC METABOLIC PANEL
CO2: 28 mEq/L (ref 19–32)
Calcium: 8.7 mg/dL (ref 8.4–10.5)
Chloride: 102 mEq/L (ref 96–112)
Creatinine, Ser: 1.22 mg/dL (ref 0.4–1.5)
GFR calc Af Amer: >60 mL/min (ref >60)
Glucose, Bld: 115 mg/dL — ABNORMAL HIGH (ref 70–99)

## 2011-05-04 LAB — DIFFERENTIAL
Blasts: 0 %
Eosinophils Absolute: 0.1 10*3/uL (ref 0.0–0.7)
Metamyelocytes Relative: 0 %
Myelocytes: 0 %
Neutro Abs: 3.4 10*3/uL (ref 1.7–7.7)
Neutrophils Relative %: 58 % (ref 43–77)
Promyelocytes Absolute: 0 %
nRBC: 0 /100 WBC

## 2011-05-04 LAB — GLUCOSE, CAPILLARY
Glucose-Capillary: 128 mg/dL — ABNORMAL HIGH (ref 70–99)
Glucose-Capillary: 104 mg/dL — ABNORMAL HIGH (ref 70–99)
Glucose-Capillary: 131 mg/dL — ABNORMAL HIGH (ref 70–99)

## 2011-05-04 LAB — CBC
HCT: 32.1 % — ABNORMAL LOW (ref 39.0–52.0)
Hemoglobin: 10.7 g/dL — ABNORMAL LOW (ref 13.0–17.0)
MCH: 27.2 pg (ref 26.0–34.0)
MCHC: 33.3 g/dL (ref 30.0–36.0)
RBC: 3.94 MIL/uL — ABNORMAL LOW (ref 4.22–5.81)

## 2011-05-05 LAB — CULTURE, BLOOD (ROUTINE X 2)
Culture  Setup Time: 201205170418
Culture: NO GROWTH

## 2011-05-05 LAB — BASIC METABOLIC PANEL
Calcium: 8.3 mg/dL — ABNORMAL LOW (ref 8.4–10.5)
GFR calc Af Amer: >60 mL/min (ref >60)
GFR calc non Af Amer: 56 mL/min — ABNORMAL LOW (ref >60)
Glucose, Bld: 115 mg/dL — ABNORMAL HIGH (ref 70–99)
Potassium: 3.8 mEq/L (ref 3.5–5.1)
Sodium: 139 mEq/L (ref 135–145)

## 2011-05-05 LAB — DIFFERENTIAL
Basophils Absolute: 0.1 10*3/uL (ref 0.0–0.1)
Eosinophils Absolute: 0.2 10*3/uL (ref 0.0–0.7)
Lymphs Abs: 1.5 10*3/uL (ref 0.7–4.0)
Monocytes Absolute: 0.8 10*3/uL (ref 0.1–1.0)
Neutro Abs: 6.4 10*3/uL (ref 1.7–7.7)

## 2011-05-05 LAB — CBC
MCHC: 33 g/dL (ref 30.0–36.0)
Platelets: 245 10*3/uL (ref 150–400)
RDW: 13.7 % (ref 11.5–15.5)
WBC: 9 10*3/uL (ref 4.0–10.5)

## 2011-05-05 LAB — GLUCOSE, CAPILLARY
Glucose-Capillary: 188 mg/dL — ABNORMAL HIGH (ref 70–99)
Glucose-Capillary: 112 mg/dL — ABNORMAL HIGH (ref 70–99)

## 2011-05-07 LAB — CULTURE, BLOOD (ROUTINE X 2)
Culture  Setup Time: 201205192057
Culture  Setup Time: 201205192057
Culture: NO GROWTH

## 2011-05-11 ENCOUNTER — Other Ambulatory Visit (HOSPITAL_COMMUNITY): Payer: Self-pay | Admitting: Oncology

## 2011-05-11 ENCOUNTER — Encounter (HOSPITAL_BASED_OUTPATIENT_CLINIC_OR_DEPARTMENT_OTHER): Payer: Medicare Other | Admitting: Oncology

## 2011-05-11 DIAGNOSIS — C189 Malignant neoplasm of colon, unspecified: Secondary | ICD-10-CM

## 2011-05-11 DIAGNOSIS — D709 Neutropenia, unspecified: Secondary | ICD-10-CM

## 2011-05-11 LAB — COMPREHENSIVE METABOLIC PANEL
ALT: 11 U/L (ref 0–53)
AST: 15 U/L (ref 0–37)
Chloride: 107 mEq/L (ref 96–112)
Creatinine, Ser: 1.24 mg/dL (ref 0.40–1.50)
Sodium: 138 mEq/L (ref 135–145)
Total Bilirubin: 0.2 mg/dL — ABNORMAL LOW (ref 0.3–1.2)
Total Protein: 6 g/dL (ref 6.0–8.3)

## 2011-05-11 LAB — CBC WITH DIFFERENTIAL/PLATELET
BASO%: 0.1 % (ref 0.0–2.0)
EOS%: 0 % (ref 0.0–7.0)
HCT: 31.3 % — ABNORMAL LOW (ref 38.4–49.9)
MCH: 28.5 pg (ref 27.2–33.4)
MCHC: 33.2 g/dL (ref 32.0–36.0)
NEUT%: 94.5 % — ABNORMAL HIGH (ref 39.0–75.0)
RBC: 3.65 10*6/uL — ABNORMAL LOW (ref 4.20–5.82)
WBC: 52.7 10*3/uL (ref 4.0–10.3)
lymph#: 2.7 10*3/uL (ref 0.9–3.3)

## 2011-05-11 NOTE — H&P (Signed)
NAMEELIHUE, EBERT NO.:  [id number]  MEDICAL RECORD NO.:  [id number]           PATIENT TYPE:  E  LOCATION:  WLED                         FACILITY:  [facility]  PHYSICIAN:  Houston Siren, MD           DATE OF BIRTH:  08-Jan-1932  DATE OF ADMISSION:  04/29/2011 DATE OF DISCHARGE:                             HISTORY & PHYSICAL   PRIMARY CARE PHYSICIAN:  Dr. Lesle Chris of Family practice, pulmonary care.  NEUROLOGIST:  Pramod P. Sethi.  ONCOLOGIST:  Samul Dada, M.D.  GENERAL SURGERY:  Clovis Pu. Cornett, M.D.  REASON FOR ADMISSION:  Sepsis of unclear source.  HISTORY OF PRESENT ILLNESS:  This is a 77 year old male with history of rectal cancer, status post diverting colostomy with recent loop reversal, history of leukopenia, seizure disorder, BPH, and diabetes, brought to the emergency room tonight by his wife because he was having chills and a temperature of 102.  She denied cough, headache, diarrhea, abdominal pain, or any other GU complaints.  Evaluation in the emergency room show a white count of 2.5 thousand, hemoglobin of 9.3, platelet count of 205,000.  His INR is normal at 1.16.  An abdominopelvic CT was done, which showed no acute intraabdominal process except for some stool and atelectasis of both lung bases.  He has a serum sodium of 128 and potassium of 3.1 with normal creatinine of 1.2.  His chest x-ray is clear.  His lactic acid is 2.4.  He was borderline hypotensive with systolic blood pressure 80-88.  He was given fluids.  He is otherwise rather asymptomatic and was not displaying any peripheral signs of shock. His urinalysis is negative as well.  Hospitalist was asked to admit the patient after critical care service was consulted.  With [facility] pts, he was supposed to be transferred to [facility] under teaching service; however, it was deemed that he was unstable to be transferred.  PAST MEDICAL HISTORY: 1. Coronary artery  disease. 2. Hypertension. 3. Diabetes. 4. Cancer of the colon, status post colostomy with reversal and     closure of loop ileostomy. 5. Prior CVA. 6. Seizure disorder. 7. Skin cancer. 8. History of leukopenia.  PAST SURGICAL HISTORY: 1. Ileostomy. 2. Tonsillectomy. 3. Vasectomy. 4. Total hip replacement.  SOCIAL HISTORY:  He lives with his wife and they have been married for 53 years.  He is not a smoker.  Denied drug or alcohol use.  ALLERGIES:  No known drug allergies.  CURRENT MEDICATIONS:  Include doxazosin, Proscar, Humalog, HCTZ, Januvia, pravastatin, and Topamax dosages unclear.  REVIEW OF SYSTEMS:  Otherwise unremarkable.  PHYSICAL EXAMINATION:  VITAL SIGNS:  Blood pressure 91/43, pulse of 90, respiratory rate 16, temperature 98.2, T-max 102.2. GENERAL:  He is alert and oriented and is in no apparent distress.  He is conversing meaningfully. HEENT:  Sclerae are nonicteric. NECK:  Supple. SKIN:  Warm and dry.  No cervical adenopathy.  No stridor. CARDIAC:  S1 and S2 regular.  I did not hear any gallop. LUNGS:  Clear.  No wheezes, rales, or any evidence of consolidation. ABDOMEN:  Soft, nondistended, nontender.  Bowel sounds present.  His wound looks dry and clean. EXTREMITIES:  Show no edema and no calf tenderness.  Good distal pulses bilaterally and he has adequate peripheral perfusion. NEUROLOGIC:  Unremarkable. PSYCHIATRIC:  Unremarkable.  OBJECTIVE FINDINGS:  CT of the abdomen and pelvis show atelectasis of lung bases with distal small-bowel and distal colon anastomoses without any adverse feature, retained stone throughout the colon, and no acute finding in the abdominopelvic exam.  INR of 1.16.  White count of 2.5 thousand, hemoglobin 9.3, MCV of 81, platelet count 205,000.  Lactic acid is 2.4.  Chest x-ray showed chronic lower lung volumes.  Serum sodium 128, potassium 3.1, glucose of 174, creatinine 1.2.  Liver function tests are normal.  Urinalysis is  negative.  IMPRESSION:  This is a 77 year old with chronic leukopenia presented with sepsis of unclear source.  The fact that his white count is not elevated is even more concerning in the face of sepsis.  Blood culture and urine cultures were done.  He was started on vancomycin and Zosyn. I will continue this broad-spectrum antibiotics.  He will need intravenous fluid.  We will start him on the sepsis protocol.  It seems like his intra-abdominal process is okay by imaging and by exam.  We will admit him to SDU or ICU for closer monitoring.  We will admit him to [facility].  He is a full code.  As soon as his medications are reconciled, we will continue all of them, except blood pressure medication and HCTZ.     Houston Siren, MD     PL/MEDQ  D:  04/29/2011  T:  04/29/2011  Job:  [id number]  cc:   Dr. [facility]  Pramod P. Pearlean Brownie, MD Fax: [phone]  Samul Dada, M.D. Fax: [phone]  Maisie Fus A. Cornett, M.D. [address] [place] [address]  Electronically Signed by Houston Siren  on 05/11/2011 09:14:19 PM

## 2011-05-17 NOTE — Discharge Summary (Signed)
[name], [name]               ACCOUNT NO.:  [id number]  MEDICAL RECORD NO.:  [id number]           PATIENT TYPE:  I  LOCATION:  1517                         FACILITY:  [facility]  PHYSICIAN:  Erick Blinks, MD     DATE OF BIRTH:  1932-03-08  DATE OF ADMISSION:  04/29/2011 DATE OF DISCHARGE:  05/05/2011                              DISCHARGE SUMMARY   PRIMARY CARE PHYSICIAN:  Brett Canales A. Cleta Alberts, M.D. from [facility]  CARDIOLOGIST:  Madolyn Frieze. Jens Som, MD, [facility]  GENERAL SURGEON:  Clovis Pu. Cornett, M.D.  HEMATOLOGIST:  Samul Dada, M.D.  DISCHARGE DIAGNOSES: 1. Neutropenic fever, resolved. 2. Sepsis syndrome, resolved. 3. Clostridium difficile colitis, improved. 4. Mild systolic dysfunction in the setting of sepsis, for outpatient     followup. 5. Diastolic dysfunction. 6. Insulin-dependent diabetes. 7. Hypokalemia. 8. 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 9. History of hypertension. 10.History of coronary artery disease. 11.History of colon cancer status post diverting colostomy in the past     and reversal per Dr. Luisa Hart. 12.Hyponatremia, resolved. 13.Hyponatremia, resolved.  DISCHARGE MEDICATIONS: 1. Potassium chloride 20 mEq p.o. daily. 2. Florastor 250 mg p.o. b.i.d. 3. Metronidazole 500 mg p.o. q.8 h 4. Questran 4 g p.o. b.i.d. p.r.n. 5. Doxazosin 4 mg 1/2 tablet p.o. daily. 6. Finasteride 5 mg p.o. daily. 7. Hydrochlorothiazide 25 mg p.o. daily. 8. Pravastatin 20 mg p.o. daily. 9. Humulin R 5 to 15 units subcutaneous 3 times daily by sliding scale 10.Neupogen 480 mcg one injection subcutaneous daily. 11.Vitamin C powder over-the-counter 1 packet p.o. daily. 12.Omega-3 acid 1 g 1 capsule p.o. daily. 13.Vitamin D3 5000 units 1 tablet p.o. b.i.d. 14.Topamax 50 mg p.o. b.i.d. 15.Allopurinol 100 mg p.o. daily. 16.Percocet 5/325 mg 2 tablets p.o. q.4 h p.r.n. 17.Vitamin B12 patch one patch transdermal daily  p.r.n. 18.Lisinopril 10 mg p.o. daily.  ADMISSION HISTORY:  This is a 77 year old gentleman who presents to the ER with chills and fever.  In the ER, he was found to have a WBC count of 2.5.  He was found to be dehydrated with sodium of 128 and potassium 3.1.  He was hypotensive with systolic blood pressure in the 80s.  His lactic acid was 2.4.  He was subsequently admitted to the intensive care unit for management of sepsis.  HOSPITAL COURSE: 1. Sepsis syndrome.  The patient was started empirically on vancomycin     and Zosyn.  He was aggressively hydrated with IV fluids.  He     eventually did defervesce.  His blood cultures to this point have     all been negative.  No clear source of initial sepsis was found.     The patient had CT abdomen and pelvis as well as CT chest done     which did not show any signs of infection.  His sepsis has since     resolved and he is afebrile. 2. Diarrhea.  The patient did develop diarrhea few days into his     hospital course and had fever.  Stool was sent for C diff which  returned positive.  The patient was placed on Flagyl.  His diarrhea     has since improved.  He reports minimal stool output at this point     and is requesting to be discharged home. 3. Neutropenic fever with longstanding neutropenia.  The patient is     followed by Dr. Arline Asp.  He was started on Neupogen while here in     the hospital.  Since then, his WBC count has improved into a normal     range.  It is recommended that he continue on daily Neupogen at     home which he has done in the past as well and he will follow up     Dr. Arline Asp for further management of this. 4. Systolic dysfunction.  The patient was noted to have echocardiogram     with ejection fraction of 45-50%.  This was lower than prior     ejection fraction noted to be in 60-65% range 2 years ago.  The     patient's current echocardiogram was taken in the context of sepsis     syndrome which will cause  some depression in the LV function.  The     case was discussed with Dr. Deborah Chalk, on-call for [facility]     and it was felt that the patient did not require any inpatient     workup since his EKG did not show any acute changes.  His cardiac     enzymes thus far have been negative.  It is recommended that he     follow up with his outpatient cardiologist in the next 1 to 2 weeks     for further management as felt appropriate. 5. Hyponatremia.  The patient was initially volume depleted and he was     also on hydrochlorothiazide, this was held on admission but his     felt appropriate to restart this since this has been a longstanding     medication for him.  He will need repeat blood work when he follows     up with his primary care physician. 6. History of colon cancer.  The patient recently had his colostomy     reversal.  He was seen by Dr. Luisa Hart here in the hospital and will     follow up with him as an outpatient as scheduled. 7. Anemia.  The patient was transfused 1 unit of packed red blood     cells.  His hemoglobin dropped to 8 during this hospital stay.  He     did not have any gross evidence of bleeding.  His hemoglobin     currently stable at 10.6. 8.  CONSULTATIONS: 1. General Surgery, Dr. Luisa Hart. 2. Oncology, Dr. Arline Asp  PROCEDURES: 1. Chest x-ray on Apr 29, 2011, shows no acute cardiopulmonary     abnormality. 2. CT scan of the abdomen and pelvis shows atelectasis at the lung     base with distal small bowel and distal colon anastomosis without     adverse features, retained stool throughout the colon, no acute     findings in the abdomen or pelvis. 3. Chest x-ray on May 01, 2011, shows low lung volumes, bilateral     lower lobe opacities, thought to be atelectasis, pneumonia not     excluded. 4. CT angiogram on May 01, 2011, shows no evidence of pulmonary     embolism, moderate bilateral pleural effusions, left greater than     right lower lobe opacity  likely atelectasis, right basilar     atelectasis.  PROCEDURES:  None.  DISCHARGE INSTRUCTIONS:  The patient should follow up with his primary care physician in the next 1 week and have a repeat basic metabolic panel drawn to check for electrolytes.  He will need to schedule appointment with his primary cardiologist in the next 1 to 2 weeks to reassess his LV function and determine if a repeat echo is necessary. He will be followed up by Dr. Arline Asp for his neutropenia and he should follow up with Dr. Graciela Husbands as previously scheduled.  He should continue on a heart-healthy low-calorie diet; conduct his activity as tolerated.  He will be discharged home in the care of his wife.  Plan was discussed with the patient as well as his wife who are in agreement.  CONDITION AT TIME OF DISCHARGE:  Improved.     Erick Blinks, MD     JM/MEDQ  D:  05/05/2011  T:  05/05/2011  Job:  [id number]  cc:   Samul Dada, M.D. Fax: [phone]  Maisie Fus A. Cornett, M.D. [address] [place] [address]  Madolyn Frieze. Jens Som, MD, [facility] [address]  [address] [place] [address]  Electronically Signed by Erick Blinks  on 05/17/2011 02:48:13 AM

## 2011-05-18 ENCOUNTER — Encounter (HOSPITAL_BASED_OUTPATIENT_CLINIC_OR_DEPARTMENT_OTHER): Payer: Medicare Other | Admitting: Oncology

## 2011-05-18 ENCOUNTER — Other Ambulatory Visit (HOSPITAL_COMMUNITY): Payer: Self-pay | Admitting: Oncology

## 2011-05-18 LAB — CBC WITH DIFFERENTIAL/PLATELET
Basophils Absolute: 0 10*3/uL (ref 0.0–0.1)
EOS%: 1.3 % (ref 0.0–7.0)
HCT: 31.3 % — ABNORMAL LOW (ref 38.4–49.9)
HGB: 10.6 g/dL — ABNORMAL LOW (ref 13.0–17.1)
LYMPH%: 54.8 % — ABNORMAL HIGH (ref 14.0–49.0)
MCH: 28.8 pg (ref 27.2–33.4)
MCV: 85.4 fL (ref 79.3–98.0)
MONO%: 11.5 % (ref 0.0–14.0)
NEUT%: 31.4 % — ABNORMAL LOW (ref 39.0–75.0)

## 2011-05-19 ENCOUNTER — Telehealth: Payer: Self-pay | Admitting: Cardiology

## 2011-05-19 ENCOUNTER — Ambulatory Visit (INDEPENDENT_AMBULATORY_CARE_PROVIDER_SITE_OTHER): Payer: Medicare Other | Admitting: Physician Assistant

## 2011-05-19 ENCOUNTER — Encounter: Payer: Self-pay | Admitting: Physician Assistant

## 2011-05-19 DIAGNOSIS — D72819 Decreased white blood cell count, unspecified: Secondary | ICD-10-CM | POA: Insufficient documentation

## 2011-05-19 DIAGNOSIS — C187 Malignant neoplasm of sigmoid colon: Secondary | ICD-10-CM

## 2011-05-19 DIAGNOSIS — I2510 Atherosclerotic heart disease of native coronary artery without angina pectoris: Secondary | ICD-10-CM

## 2011-05-19 DIAGNOSIS — I429 Cardiomyopathy, unspecified: Secondary | ICD-10-CM | POA: Insufficient documentation

## 2011-05-19 DIAGNOSIS — I428 Other cardiomyopathies: Secondary | ICD-10-CM

## 2011-05-19 DIAGNOSIS — R609 Edema, unspecified: Secondary | ICD-10-CM

## 2011-05-19 LAB — BASIC METABOLIC PANEL
BUN: 28 mg/dL — ABNORMAL HIGH (ref 6–23)
Calcium: 8.7 mg/dL (ref 8.4–10.5)
Creatinine, Ser: 1.1 mg/dL (ref 0.4–1.5)
GFR: 66.87 mL/min (ref >60.00)

## 2011-05-19 MED ORDER — METOPROLOL SUCCINATE ER 25 MG PO TB24: 25.0000 mg | ORAL_TABLET | Freq: Every day | ORAL | Status: DC

## 2011-05-19 MED ORDER — METOPROLOL SUCCINATE ER 25 MG PO TB24: ORAL_TABLET | ORAL | Status: DC

## 2011-05-19 MED ORDER — METOPROLOL SUCCINATE ER 25 MG PO TB24: 12.5000 mg | ORAL_TABLET | Freq: Every day | ORAL | Status: DC

## 2011-05-19 MED ORDER — LISINOPRIL 10 MG PO TABS: 10.0000 mg | ORAL_TABLET | Freq: Every day | ORAL | Status: DC

## 2011-05-19 NOTE — Assessment & Plan Note (Signed)
Reduced ejection fraction in the setting of sepsis.  He does have some edema on exam.  However, I think this is probably related to venous insufficiency.  He and his wife will check his medications when he gets home and let us know exactly what he is taking.  If he is not taking hydrochlorothiazide, he can take a half to whole tablet a day as needed for swelling.  I also told him to get some compression stockings over-the-counter and to keep his legs elevated.  I do not believe he has volume overload at this point.  He denies significant shortness of breath.  He denies significant weight gain.  Check a basic metabolic panel and a BNP today.  We will confirm that he is taking lisinopril.  I would also like to see him on a beta blocker.  I will give him a prescription for Toprol-XL 25 mg one half tablet daily.  He will likely need a follow up echocardiogram in about 3 months to reassess and his ejection fraction.

## 2011-05-19 NOTE — Progress Notes (Signed)
History of Present Illness: Primary Cardiologist: Dr. Olga Millers  [name] is a 77 y.o. male With a history of presumed CAD based upon an abnormal stress test in the past as well as diabetes, hypertension, hyperlipidemia, prior stroke, aortic root dilatation and rectal carcinoma.  He saw Dr. Jens Som recently for surgical clearance for closure of his loop ileostomy.  Preoperative Myoview was negative for ischemia and he was cleared for surgery.  His procedure was performed 4/24.    He was then admitted to [facility] 5/17-5/23 with sepsis.  His blood cultures returned negative but he was treated with vancomycin and Zosyn with improvement of symptoms.  He did develop C. Difficile colitis and was placed on Flagyl.  He had a recurrence neutropenic fevers.  His wife describes significant chills that she calls "seizures."  He was seen by oncology was placed on Neupogen with improvement in his white counts.  An echocardiogram was performed during his admission and it demonstrated mildly depressed LV function of 45-50%.  Previous LV function was normal by echocardiogram 3/10.  Cardiac markers were negative.  He developed anemia which required transfusion with one unit packed red blood cells.  Informal discussion was held with cardiology and it was recommended that the patient follow up as an outpatient for his reduced ejection fraction.  It was thought that this was likely related to his sepsis.  Of note, during his hospitalization, chest CT was negative for pulmonary embolism but did demonstrate moderate bilateral effusions.  His BNP was 3930 during his admission.  Albumin was low at 2.3.  Prior to discharge, potassium 3.8, creatinine 1.26, hemoglobin 10.6.  WBC improved from 1.6-9.0.  He presents for follow up.  He denies chest pain.  He denies shortness of breath.  He denies syncope.  He denies orthopnea, PND.  He does have some ankle edema.  Hydrochlorothiazide and potassium are both listed on  his medication list.  His wife states that these have both been stopped.  This may explain his increased edema.  He does have varicosities on exam.  Past Medical History  Diagnosis Date  . Aortic root dilatation   . DM2 (diabetes mellitus, type 2)   . Stroke   . HLD (hyperlipidemia)   . HTN (hypertension)   . CAD (coronary artery disease)     Presumed from previously mildy abnormal myoview;  myoview 4/12: EF 73%, no ischemia  . Leukopenia   . Pneumothorax   . Rectal cancer   . Cardiomyopathy     a. echo 5/12: EF 45-50%, mild LVH, grade 2 diast dysfxn, RAE    Current Outpatient Prescriptions  Medication Sig Dispense Refill  . allopurinol (ZYLOPRIM) 100 MG tablet Take 100 mg by mouth daily.        . Ascorbic Acid (VITAMIN C) POWD Take 1 Package by mouth daily.        Marland Kitchen aspirin 81 MG tablet Take 81 mg by mouth daily.        . Cholecalciferol (VITAMIN D-3) 5000 UNITS TABS Take by mouth 2 (two) times daily.        . ciprofloxacin (CIPRO) 500 MG tablet Take 500 mg by mouth as needed. For dental procedures and such      . doxazosin (CARDURA) 4 MG tablet Take 2 mg by mouth at bedtime.       . finasteride (PROSCAR) 5 MG tablet Take 5 mg by mouth daily.        . fish oil-omega-3 fatty  acids 1000 MG capsule Take 2 g by mouth daily.        . insulin regular (HUMULIN R,NOVOLIN R) 100 UNIT/ML injection Sliding scale       . lisinopril (PRINIVIL,ZESTRIL) 10 MG tablet Take 1 tablet (10 mg total) by mouth daily.  90 tablet  3  . NEUPOGEN 480 MCG/1.6ML injection Inject 480 mcg as directed daily.      . NON FORMULARY VIT B12 patch --- daily as needed when feeling weak       . ONE TOUCH ULTRA TEST test strip as directed.      Marland Kitchen oxyCODONE-acetaminophen (PERCOCET) 5-325 MG per tablet Take 2 tablets by mouth Every 4 hours as needed.      . pravastatin (PRAVACHOL) 20 MG tablet Take 20 mg by mouth daily.        Marland Kitchen topiramate (TOPAMAX) 50 MG tablet Take 50 mg by mouth. TAKE 1 TAB IN THE AM AND 2 TABS IN THE  PM       . DISCONTD: cholestyramine (QUESTRAN) 4 G packet Take 4 g by mouth Twice daily as needed.      Marland Kitchen DISCONTD: lisinopril (PRINIVIL,ZESTRIL) 10 MG tablet Take 1 tablet by mouth daily.      Marland Kitchen DISCONTD: topiramate (TOPAMAX) 50 MG tablet Take 50 mg by mouth 2 (two) times daily.        . metoprolol succinate (TOPROL XL) 25 MG 24 hr tablet TAKE 1/2 TABLET DAILY  45 tablet  3  . DISCONTD: hydrochlorothiazide 25 MG tablet Take 25 mg by mouth daily.        Marland Kitchen DISCONTD: KLOR-CON M20 20 MEQ tablet Take 1 tablet by mouth daily.      Marland Kitchen DISCONTD: metoprolol succinate (TOPROL XL) 25 MG 24 hr tablet Take 1 tablet (25 mg total) by mouth daily.  30 tablet  11  . DISCONTD: metoprolol succinate (TOPROL XL) 25 MG 24 hr tablet Take 1 tablet (25 mg total) by mouth daily.  30 tablet  11  . DISCONTD: metoprolol succinate (TOPROL XL) 25 MG 24 hr tablet Take 1 tablet (25 mg total) by mouth daily.  30 tablet  11  . DISCONTD: metoprolol succinate (TOPROL XL) 25 MG 24 hr tablet TAKE 1/2 TABLET DAILY  30 tablet  11    Allergies: No Known Allergies  Vital Signs: BP 122/60  Pulse 84  Ht 6\' 3"  (1.905 m)  Wt 178 lb (80.74 kg)  BMI 22.25 kg/m2  PHYSICAL EXAM: Well nourished, well developed, in no acute distress HEENT: normal Neck: no JVD Cardiac:  normal S1, S2; RRR; no murmur Lungs:  Decreased breath sounds bilaterally, clear to auscultation bilaterally, no wheezing, rhonchi or rales Abd: soft, nontender, no hepatomegaly Ext: 1+ ankle edema Bilaterally; multiple varicosities noted Skin: warm and dry Neuro:  CNs 2-12 intact, no focal abnormalities noted  EKG:  Sinus rhythm, heart rate 84, normal axis, PVC, nonspecific ST-T wave changes, no significant changes compared to prior tracing  ASSESSMENT AND PLAN:

## 2011-05-19 NOTE — Patient Instructions (Addendum)
Your physician recommends that you schedule a follow-up appointment in: 4-6 weeks with Dr. Jens Som as per Tereso Newcomer, PA-C  Your physician recommends that you return for lab work in: TODAY BMET/BNP 425.4, 782.3  Your physician has recommended you make the following change in your medication: START HCTZ 25 MG YOU CAN TAKE 1/2 (HALF) TO 1 TABLET DAILY IF YOU ARE NOT ALREADY TAKING THIS; START TOPROL XL 25 MG 1 TABLET DAILY;  PLEASE CALL us WHEN YOU GET HOME AND LET us KNOW EXACTLY WHAT MEDICATIONS YOU ARE TAKING [phone] SCOTT WEAVER, PA-C.

## 2011-05-19 NOTE — Assessment & Plan Note (Signed)
Continue follow up with oncology.  Etiology for his neutropenia is unknown.

## 2011-05-19 NOTE — Telephone Encounter (Signed)
PT RETURNING CAROL CALL RE PT MEDS.

## 2011-05-19 NOTE — Assessment & Plan Note (Signed)
No angina.  Continue aspirin. 

## 2011-05-20 ENCOUNTER — Telehealth: Payer: Self-pay | Admitting: Cardiology

## 2011-05-20 LAB — BRAIN NATRIURETIC PEPTIDE: Pro B Natriuretic peptide (BNP): 93 pg/mL (ref 0.0–100.0)

## 2011-05-20 NOTE — Telephone Encounter (Signed)
Pt spouse would like test results

## 2011-05-20 NOTE — Group Therapy Note (Signed)
[name], [name]               ACCOUNT NO.:  [id number]  MEDICAL RECORD NO.:  [id number]           PATIENT TYPE:  I  LOCATION:  1517                         FACILITY:  [facility]  PHYSICIAN:  Kela Millin, M.D.DATE OF BIRTH:  [birth year]                                PROGRESS NOTE   CURRENT DIAGNOSES: 1. Clostridium difficile colitis. 2. Hypokalemia. 3. Neutropenia - Resolved. 4. Diabetes mellitus. 5. 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 Arline Asp. 6. Hypertension. 7. History of coronary artery disease. 8. History of colon cancer - Status post diverting colostomy in the     past and recent reversal per Dr. Luisa Hart in April 2012. 9. Sepsis syndrome. 10.Hyponatremia - Resolved.  CONSULTATIONS: 1. General surgery - Dr. Luisa Hart. 2. Oncology - Dr. Arline Asp.  PROCEDURES AND STUDIES: 1. Chest x-ray on May 17th - Chronically low lung volumes - No acute     cardiopulmonary abnormality. 2. CT scan of the abdomen and pelvis - Atelectasis at the lung bases.     Distal small bowel and distal colon anastomosis without adverse     features.  Retained stool throughout the colon.  No acute findings     in the abdomen or pelvis. 3. Followup chest x-ray on May 19th - Low lung volumes.  Bilateral     lower lobe opacities likely atelectasis, pneumonia not excluded. 4. CT angiogram on May 19 - No evidence of pulmonary embolus.     Moderate bilateral pleural effusions, left greater than right lower     lobe opacity, likely atelectasis.  Right basilar atelectasis.  BRIEF HISTORY: The patient is a 77 year old white male with the above-listed medical problems as well as a history of BPH who was brought to the ER because he was having chills and fever to 102.  He denied cough, headaches, diarrhea, abdominal pain, and also denied any GU symptoms.  In the ED, he was found to have a white cell count of 2.5 and hemoglobin of 9.3, and platelet  count was 205.  A CT scan of his abdomen and pelvis was done and the results are as stated above.  The patient was noted to be hyponatremic with a sodium of 128 and he was hypokalemic with a potassium of 3.1 and his creatinine was within normal limits at 1.2.  He was found to be hypotensive with systolic blood pressures 80 to 88 and his lactic acid level was 2.4.  He was given IV fluids and he was admitted to the hospitalist service for further evaluation and management.  It was noted that the patient is a teaching service patient, usually seen at [facility], but because he was unstable in the ED, he was admitted to [facility] instead of being transferred to the teaching service at [facility].  1. Sepsis syndrome - Upon admission, the patient was hypotensive with     systolic blood pressures 80% to 85% and he will was also leukopenic     with a white cell count of 2.5 initially and as noted above, he had     fevers up to  102.  He was pancultured and started on broad-spectrum     antibiotics with vancomycin and Zosyn and admitted to the intensive     care unit.  He required multiple IV fluid boluses because of his     hypotension and subsequently, it responded to the above     interventions.  The patient's white cell count worsened to a low of     1.1 while in the hospital and Oncology that had been consulted on     admission was following and placed the patient on Neupogen.  The     patient initially defervesced on the vancomycin and Zosyn and his     hypotension resolved and so he was transferred to the floor, but on     May 19th, he had another episode of fever and at that point, his     white cell count was down to 1.1 and he also had diarrhea x3 that     day.  Stool cultures were obtained at that time and they came back     positive for Clostridium difficile.  Other workup included a CT     angiogram because the patient's D-dimer was elevated and it came     back negative for PE and  showed findings consistent with     atelectasis.  Following the stool cultures showing Clostridium     difficile, the patient was started on Flagyl and he has remained     afebrile and hemodynamically stable.  Because he required increased     amount of fluids, initially he became volume overloaded with     peripheral edema and was noted to have pleural effusions on chest x-     ray and he received p.r.n. doses of IV Lasix and with that his     peripheral edema improved and he has not had shortness of breath. 2. Hyponatremia - Secondary to volume depletion.  The patient was also     noted to be on HCTZ on admission and this was discontinued.  With     hydration, his hyponatremia resolved. 3. Hypotension - Impression was that it was secondary to #1 as well as     volume depletion.  Workup included cultures as well as cardiac     enzymes and a D-dimer.  The cardiac enzymes were negative for an     MI.  His D-dimer was elevated at 1.46 and a CT angiogram was done,     but it was negative for PE.  With empiric antibiotics and     subsequently changing his antibiotics to treat as a Clostridium     difficile diagnosed as discussed above and with hydration, his     hypotension resolved. 4. Clostridium difficile colitis - As discussed above, his stool     studies done in the hospital were positive and initially, the     patient did not have diarrhea, but today, he had 3 to 4 episodes of     watery diarrhea and so, he has been started on Florastor as well as     Questran along with the Flagyl that he was already receiving.  Once     his diarrhea improves, it is better controlled.  He can be     discharged home on oral antibiotics to complete the treatment     course. 5. Neutropenia - Per Dr. Arline Asp, the patient has had this for a few     years and has required outpatient  Neupogen in the past.  When his     white cell count dropped to 1.1, he was started again on Neupogen     here in the  hospital - His white cell count today at the time of     this dictation is 5.9.  Dr. Arline Asp has been following the patient     and his impression is that his long-standing neutropenia is     secondary to autoimmune dysregulation.  He is still on Neupogen at     this time. 6. History of seizure disorder - The patient's Neupogen was restarted     in the hospital. 7. History of coronary artery disease - He remained chest pain free     during this hospital stay. 8. Hypertension - As above, he was hypotensive initially and so his     antihypertensives were held.  He is to be discharged on lisinopril,     the hydrochlorothiazide he was on was discontinued as the patient     was significantly hyponatremic on admission. 9. History of colon cancer, status post surgery in the past with     colostomy - The patient recently had this reversed in April by Dr.     Luisa Hart, and currently still has a right lower quadrant open wound.     Dr. Luisa Hart was consulted and saw the patient in the hospital and     stated that there was no intra-abdominal source for his fever and     also that the wound was clean.  The patient has continued local     wound care during this hospital stay.  He is to follow up     outpatient with Surgery as scheduled. 10.Anemia - The patient was transfused 1 unit of packed red blood     cells when his hemoglobin dropped to 8.0 during this hospital stay.     He did not have any gross evidence of bleeding.  His hemoglobin     today is 10.7.  The discharging hospitalist to dictate the rest of the patient's hospital course as well as final discharge medications.     Kela Millin, M.D.     ACV/MEDQ  D:  05/04/2011  T:  05/04/2011  Job:  [id number]  Electronically Signed by Donnalee Curry M.D. on 05/20/2011 09:15:39 AM

## 2011-05-20 NOTE — Telephone Encounter (Signed)
pt aware of lab results [name]

## 2011-05-25 ENCOUNTER — Encounter: Payer: Medicare Other | Admitting: Oncology

## 2011-05-25 ENCOUNTER — Other Ambulatory Visit (HOSPITAL_COMMUNITY): Payer: Self-pay | Admitting: Oncology

## 2011-05-25 DIAGNOSIS — C189 Malignant neoplasm of colon, unspecified: Secondary | ICD-10-CM

## 2011-05-25 DIAGNOSIS — D709 Neutropenia, unspecified: Secondary | ICD-10-CM

## 2011-05-25 LAB — CBC WITH DIFFERENTIAL/PLATELET
BASO%: 1.4 % (ref 0.0–2.0)
EOS%: 3.3 % (ref 0.0–7.0)
HCT: 31.2 % — ABNORMAL LOW (ref 38.4–49.9)
MCH: 26.9 pg — ABNORMAL LOW (ref 27.2–33.4)
MCHC: 32.1 g/dL (ref 32.0–36.0)
NEUT%: 2.9 % — ABNORMAL LOW (ref 39.0–75.0)
RDW: 15.6 % — ABNORMAL HIGH (ref 11.0–14.6)
lymph#: 1.3 10*3/uL (ref 0.9–3.3)

## 2011-06-01 ENCOUNTER — Encounter (HOSPITAL_BASED_OUTPATIENT_CLINIC_OR_DEPARTMENT_OTHER): Payer: Medicare Other | Admitting: Oncology

## 2011-06-01 ENCOUNTER — Other Ambulatory Visit (HOSPITAL_COMMUNITY): Payer: Self-pay | Admitting: Oncology

## 2011-06-01 DIAGNOSIS — C211 Malignant neoplasm of anal canal: Secondary | ICD-10-CM

## 2011-06-01 DIAGNOSIS — D709 Neutropenia, unspecified: Secondary | ICD-10-CM

## 2011-06-01 DIAGNOSIS — C189 Malignant neoplasm of colon, unspecified: Secondary | ICD-10-CM

## 2011-06-01 LAB — CBC WITH DIFFERENTIAL/PLATELET
BASO%: 0.6 % (ref 0.0–2.0)
EOS%: 3.1 % (ref 0.0–7.0)
Eosinophils Absolute: 0.2 10*3/uL (ref 0.0–0.5)
MCV: 84.4 fL (ref 79.3–98.0)
MONO%: 13.2 % (ref 0.0–14.0)
NEUT#: 4.2 10*3/uL (ref 1.5–6.5)
RBC: 3.59 10*6/uL — ABNORMAL LOW (ref 4.20–5.82)
RDW: 16.6 % — ABNORMAL HIGH (ref 11.0–14.6)

## 2011-06-01 LAB — COMPREHENSIVE METABOLIC PANEL
ALT: 13 U/L (ref 0–53)
AST: 15 U/L (ref 0–37)
Albumin: 4.2 g/dL (ref 3.5–5.2)
Alkaline Phosphatase: 79 U/L (ref 39–117)
Glucose, Bld: 152 mg/dL — ABNORMAL HIGH (ref 70–99)
Potassium: 4.5 mEq/L (ref 3.5–5.3)
Sodium: 136 mEq/L (ref 135–145)
Total Protein: 6.7 g/dL (ref 6.0–8.3)

## 2011-06-15 ENCOUNTER — Other Ambulatory Visit (HOSPITAL_COMMUNITY): Payer: Self-pay | Admitting: Oncology

## 2011-06-15 ENCOUNTER — Encounter (HOSPITAL_BASED_OUTPATIENT_CLINIC_OR_DEPARTMENT_OTHER): Payer: Medicare Other | Admitting: Oncology

## 2011-06-15 DIAGNOSIS — C189 Malignant neoplasm of colon, unspecified: Secondary | ICD-10-CM

## 2011-06-15 DIAGNOSIS — D709 Neutropenia, unspecified: Secondary | ICD-10-CM

## 2011-06-15 LAB — MORPHOLOGY

## 2011-06-15 LAB — CBC & DIFF AND RETIC
BASO%: 0.7 % (ref 0.0–2.0)
EOS%: 2.5 % (ref 0.0–7.0)
HCT: 30.7 % — ABNORMAL LOW (ref 38.4–49.9)
LYMPH%: 37.5 % (ref 14.0–49.0)
MCH: 27.2 pg (ref 27.2–33.4)
MCHC: 32.6 g/dL (ref 32.0–36.0)
MONO%: 11 % (ref 0.0–14.0)
NEUT%: 48.3 % (ref 39.0–75.0)
Platelets: 200 10*3/uL (ref 140–400)

## 2011-06-15 LAB — CHCC SMEAR

## 2011-06-17 LAB — IRON AND TIBC
TIBC: 419 ug/dL (ref 215–435)
UIBC: 397 ug/dL

## 2011-06-17 LAB — SEDIMENTATION RATE: Sed Rate: 4 mm/hr (ref 0–16)

## 2011-06-17 LAB — VITAMIN B12: Vitamin B-12: 1199 pg/mL — ABNORMAL HIGH (ref 211–911)

## 2011-06-17 LAB — FERRITIN: Ferritin: 63 ng/mL (ref 22–322)

## 2011-06-20 ENCOUNTER — Other Ambulatory Visit: Payer: Self-pay | Admitting: Family Medicine

## 2011-06-23 ENCOUNTER — Other Ambulatory Visit: Payer: Self-pay | Admitting: Family Medicine

## 2011-06-24 ENCOUNTER — Encounter: Payer: Self-pay | Admitting: Cardiology

## 2011-06-28 ENCOUNTER — Ambulatory Visit: Payer: Medicare Other | Admitting: Cardiology

## 2011-06-29 ENCOUNTER — Other Ambulatory Visit (HOSPITAL_COMMUNITY): Payer: Self-pay | Admitting: Oncology

## 2011-06-29 ENCOUNTER — Encounter (HOSPITAL_BASED_OUTPATIENT_CLINIC_OR_DEPARTMENT_OTHER): Payer: Medicare Other | Admitting: Oncology

## 2011-06-29 DIAGNOSIS — D709 Neutropenia, unspecified: Secondary | ICD-10-CM

## 2011-06-29 DIAGNOSIS — C189 Malignant neoplasm of colon, unspecified: Secondary | ICD-10-CM

## 2011-06-29 LAB — CBC WITH DIFFERENTIAL/PLATELET
BASO%: 1 % (ref 0.0–2.0)
Basophils Absolute: 0 10*3/uL (ref 0.0–0.1)
HCT: 27.1 % — ABNORMAL LOW (ref 38.4–49.9)
HGB: 9.2 g/dL — ABNORMAL LOW (ref 13.0–17.1)
MONO#: 0.5 10*3/uL (ref 0.1–0.9)
NEUT#: 0.6 10*3/uL — ABNORMAL LOW (ref 1.5–6.5)
NEUT%: 19.4 % — ABNORMAL LOW (ref 39.0–75.0)
WBC: 3 10*3/uL — ABNORMAL LOW (ref 4.0–10.3)
lymph#: 1.7 10*3/uL (ref 0.9–3.3)

## 2011-06-30 ENCOUNTER — Ambulatory Visit: Payer: Medicare Other | Admitting: Cardiology

## 2011-07-05 ENCOUNTER — Encounter (HOSPITAL_BASED_OUTPATIENT_CLINIC_OR_DEPARTMENT_OTHER): Payer: Medicare Other | Admitting: Oncology

## 2011-07-05 DIAGNOSIS — D709 Neutropenia, unspecified: Secondary | ICD-10-CM

## 2011-07-12 ENCOUNTER — Other Ambulatory Visit (HOSPITAL_COMMUNITY): Payer: Self-pay | Admitting: Oncology

## 2011-07-12 ENCOUNTER — Encounter (HOSPITAL_BASED_OUTPATIENT_CLINIC_OR_DEPARTMENT_OTHER): Payer: Medicare Other | Admitting: Oncology

## 2011-07-12 DIAGNOSIS — D709 Neutropenia, unspecified: Secondary | ICD-10-CM

## 2011-07-12 DIAGNOSIS — C189 Malignant neoplasm of colon, unspecified: Secondary | ICD-10-CM

## 2011-07-12 LAB — CBC WITH DIFFERENTIAL/PLATELET
Basophils Absolute: 0.1 10*3/uL (ref 0.0–0.1)
EOS%: 3 % (ref 0.0–7.0)
Eosinophils Absolute: 0.1 10*3/uL (ref 0.0–0.5)
HCT: 28.8 % — ABNORMAL LOW (ref 38.4–49.9)
HGB: 9.4 g/dL — ABNORMAL LOW (ref 13.0–17.1)
MCH: 27.6 pg (ref 27.2–33.4)
MCV: 84.5 fL (ref 79.3–98.0)
NEUT#: 0.9 10*3/uL — ABNORMAL LOW (ref 1.5–6.5)
NEUT%: 19.5 % — ABNORMAL LOW (ref 39.0–75.0)
lymph#: 2.5 10*3/uL (ref 0.9–3.3)

## 2011-07-13 ENCOUNTER — Encounter: Payer: Self-pay | Admitting: Cardiology

## 2011-07-21 ENCOUNTER — Encounter: Payer: Self-pay | Admitting: Cardiology

## 2011-07-21 ENCOUNTER — Ambulatory Visit (INDEPENDENT_AMBULATORY_CARE_PROVIDER_SITE_OTHER): Payer: Medicare Other | Admitting: Cardiology

## 2011-07-21 DIAGNOSIS — E785 Hyperlipidemia, unspecified: Secondary | ICD-10-CM

## 2011-07-21 DIAGNOSIS — I428 Other cardiomyopathies: Secondary | ICD-10-CM

## 2011-07-21 DIAGNOSIS — I421 Obstructive hypertrophic cardiomyopathy: Secondary | ICD-10-CM

## 2011-07-21 DIAGNOSIS — I10 Essential (primary) hypertension: Secondary | ICD-10-CM

## 2011-07-21 NOTE — Assessment & Plan Note (Signed)
Blood pressure controlled. Continue present medications. 

## 2011-07-21 NOTE — Patient Instructions (Signed)

## 2011-07-21 NOTE — Progress Notes (Signed)
HPI: [name] is a 77 y.o. male With a history of presumed CAD based upon an abnormal stress test in the past as well as diabetes, hypertension, hyperlipidemia, prior stroke, aortic root dilatation and rectal carcinoma. Most recent myoview in April of 2012 showed an ejection fraction of 73% and no ischemia. He was then admitted to [facility] 5/17-5/23 with sepsis. His blood cultures returned negative but he was treated with vancomycin and Zosyn with improvement of symptoms. He did develop C. Difficile colitis and was placed on Flagyl. He had a recurrence neutropenic fevers. His wife describes significant chills that she calls "seizures." He was seen by oncology was placed on Neupogen with improvement in his white counts. An echocardiogram was performed during his admission and it demonstrated mildly depressed LV function of 45-50%. Previous LV function was normal by echocardiogram 3/10. Cardiac markers were negative. He developed anemia which required transfusion with one unit packed red blood cells. Informal discussion was held with cardiology and it was recommended that the patient follow up as an outpatient for his reduced ejection fraction. It was thought that this was likely related to his sepsis. Of note, during his hospitalization, chest CT was negative for pulmonary embolism but did demonstrate moderate bilateral effusions. His BNP was 3930 during his admission. Albumin was low at 2.3. Prior to discharge, potassium 3.8, creatinine 1.26, hemoglobin 10.6. WBC improved from 1.6-9.0. He saw Tereso Newcomer in F/u in June of 2012. Since then, he has improved. He denies dyspnea, chest pain or syncope. He has mild edema which is slowly improving.   Current Outpatient Prescriptions  Medication Sig Dispense Refill  . allopurinol (ZYLOPRIM) 100 MG tablet Take 100 mg by mouth daily.        . Ascorbic Acid (VITAMIN C) POWD Take 1 Package by mouth daily.        Marland Kitchen aspirin 81 MG tablet Take 81 mg by  mouth daily.        . Cholecalciferol (VITAMIN D-3) 5000 UNITS TABS Take by mouth daily.       . ciprofloxacin (CIPRO) 500 MG tablet Take 500 mg by mouth as needed. For dental procedures and such      . doxazosin (CARDURA) 4 MG tablet Take 2 mg by mouth at bedtime.       . finasteride (PROSCAR) 5 MG tablet Take 5 mg by mouth daily.        . insulin regular (HUMULIN R,NOVOLIN R) 100 UNIT/ML injection Sliding scale       . lisinopril (PRINIVIL,ZESTRIL) 10 MG tablet Take 1 tablet (10 mg total) by mouth daily.  90 tablet  3  . NEUPOGEN 480 MCG/1.6ML injection Inject 480 mcg as directed daily.      . NON FORMULARY VIT B12 patch --- daily as needed when feeling weak       . ONE TOUCH ULTRA TEST test strip as directed.      . pravastatin (PRAVACHOL) 20 MG tablet Take 20 mg by mouth daily.        Marland Kitchen topiramate (TOPAMAX) 50 MG tablet Take 50 mg by mouth. TAKE 1 TAB IN THE AM AND 2 TABS IN THE PM       . fish oil-omega-3 fatty acids 1000 MG capsule Take 2 g by mouth daily.        . metoprolol succinate (TOPROL XL) 25 MG 24 hr tablet TAKE 1/2 TABLET DAILY  45 tablet  3  . oxyCODONE-acetaminophen (PERCOCET) 5-325 MG per tablet  Take 2 tablets by mouth Every 4 hours as needed.         Past Medical History  Diagnosis Date  . Aortic root dilatation   . DM2 (diabetes mellitus, type 2)   . Stroke   . HLD (hyperlipidemia)   . HTN (hypertension)   . CAD (coronary artery disease)     Presumed from previously mildy abnormal myoview;  myoview 4/12: EF 73%, no ischemia  . Leukopenia   . Pneumothorax   . Rectal cancer   . Cardiomyopathy     a. echo 5/12: EF 45-50%, mild LVH, grade 2 diast dysfxn, RAE    Past Surgical History  Procedure Date  . Hip arthroplasty   . Tonsillectomy   . S/p resection of rectal cancer     History   Social History  . Marital Status: Married    Spouse Name: N/A    Number of Children: N/A  . Years of Education: N/A   Occupational History  . retired    Social History  Main Topics  . Smoking status: Never Smoker   . Smokeless tobacco: Not on file  . Alcohol Use: No  . Drug Use: No  . Sexually Active: Not on file   Other Topics Concern  . Not on file   Social History Narrative  . No narrative on file    ROS: no fevers or chills, productive cough, hemoptysis, dysphasia, odynophagia, melena, hematochezia, dysuria, hematuria, rash, seizure activity, orthopnea, PND, pedal edema, claudication. Remaining systems are negative.  Physical Exam: Well-developed well-nourished in no acute distress.  Skin is warm and dry.  HEENT is normal.  Neck is supple. No thyromegaly.  Chest is clear to auscultation with normal expansion.  Cardiovascular exam is regular rate and rhythm.  Abdominal exam nontender or distended. No masses palpated. Extremities show 1+ edema. neuro grossly intact

## 2011-07-21 NOTE — Assessment & Plan Note (Signed)
Patient not clear about his medications. They will call us when they are able to review at home. We will continue ACE inhibitors and beta blockade. Reduced LV function may have been related to previous illness/sepsis. Repeat echocardiogram in October of 2012. Hopefully LV function will have improved.

## 2011-07-21 NOTE — Assessment & Plan Note (Signed)
Management per primary care. 

## 2011-07-22 ENCOUNTER — Telehealth: Payer: Self-pay | Admitting: Cardiology

## 2011-07-22 NOTE — Telephone Encounter (Signed)
Will update pt med list [name]

## 2011-07-22 NOTE — Telephone Encounter (Signed)
Per pt wife calling pt is taking all the medications on the list and pt wife couldn't recall if pt is taking the last medication on the list. Pt wife want to inform nurse and Dr. Jens Som that pt is indeed taking medication metoprolol 25mg .

## 2011-07-27 ENCOUNTER — Other Ambulatory Visit (HOSPITAL_COMMUNITY): Payer: Self-pay | Admitting: Oncology

## 2011-07-27 ENCOUNTER — Encounter (HOSPITAL_BASED_OUTPATIENT_CLINIC_OR_DEPARTMENT_OTHER): Payer: Medicare Other | Admitting: Oncology

## 2011-07-27 DIAGNOSIS — D709 Neutropenia, unspecified: Secondary | ICD-10-CM

## 2011-07-27 DIAGNOSIS — C211 Malignant neoplasm of anal canal: Secondary | ICD-10-CM

## 2011-07-27 LAB — CBC WITH DIFFERENTIAL/PLATELET
Basophils Absolute: 0 10*3/uL (ref 0.0–0.1)
Eosinophils Absolute: 0.1 10*3/uL (ref 0.0–0.5)
HCT: 34.5 % — ABNORMAL LOW (ref 38.4–49.9)
LYMPH%: 58.5 % — ABNORMAL HIGH (ref 14.0–49.0)
MCV: 88.9 fL (ref 79.3–98.0)
MONO#: 0.5 10*3/uL (ref 0.1–0.9)
MONO%: 20.1 % — ABNORMAL HIGH (ref 0.0–14.0)
NEUT#: 0.4 10*3/uL — CL (ref 1.5–6.5)
NEUT%: 15.6 % — ABNORMAL LOW (ref 39.0–75.0)
Platelets: 150 10*3/uL (ref 140–400)
RBC: 3.88 10*6/uL — ABNORMAL LOW (ref 4.20–5.82)
nRBC: 0 % (ref 0–0)

## 2011-07-31 LAB — COMPREHENSIVE METABOLIC PANEL
Albumin: 3.9 g/dL (ref 3.5–5.2)
BUN: 22 mg/dL (ref 6–23)
CO2: 27 mEq/L (ref 19–32)
Glucose, Bld: 165 mg/dL — ABNORMAL HIGH (ref 70–99)
Potassium: 4 mEq/L (ref 3.5–5.3)
Sodium: 135 mEq/L (ref 135–145)
Total Bilirubin: 0.4 mg/dL (ref 0.3–1.2)
Total Protein: 6.6 g/dL (ref 6.0–8.3)

## 2011-07-31 LAB — IRON AND TIBC
Iron: 65 ug/dL (ref 42–165)
TIBC: 314 ug/dL (ref 215–435)
UIBC: 249 ug/dL

## 2011-08-02 ENCOUNTER — Other Ambulatory Visit (HOSPITAL_COMMUNITY): Payer: Self-pay | Admitting: Oncology

## 2011-08-05 ENCOUNTER — Other Ambulatory Visit (HOSPITAL_COMMUNITY): Payer: Self-pay | Admitting: Oncology

## 2011-08-05 ENCOUNTER — Encounter (HOSPITAL_BASED_OUTPATIENT_CLINIC_OR_DEPARTMENT_OTHER): Payer: Medicare Other | Admitting: Oncology

## 2011-08-05 DIAGNOSIS — D709 Neutropenia, unspecified: Secondary | ICD-10-CM

## 2011-08-05 DIAGNOSIS — C211 Malignant neoplasm of anal canal: Secondary | ICD-10-CM

## 2011-08-05 LAB — CBC WITH DIFFERENTIAL/PLATELET
BASO%: 1.3 % (ref 0.0–2.0)
EOS%: 3.8 % (ref 0.0–7.0)
HCT: 30.9 % — ABNORMAL LOW (ref 38.4–49.9)
MCH: 30 pg (ref 27.2–33.4)
MCHC: 34.1 g/dL (ref 32.0–36.0)
NEUT%: 21.3 % — ABNORMAL LOW (ref 39.0–75.0)
RBC: 3.52 10*6/uL — ABNORMAL LOW (ref 4.20–5.82)
RDW: 19.7 % — ABNORMAL HIGH (ref 11.0–14.6)
lymph#: 1.3 10*3/uL (ref 0.9–3.3)

## 2011-08-05 LAB — BASIC METABOLIC PANEL
Chloride: 100 mEq/L (ref 96–112)
Creatinine, Ser: 1.36 mg/dL — ABNORMAL HIGH (ref 0.50–1.35)
Sodium: 137 mEq/L (ref 135–145)

## 2011-08-12 ENCOUNTER — Other Ambulatory Visit (HOSPITAL_COMMUNITY): Payer: Self-pay | Admitting: Oncology

## 2011-08-12 ENCOUNTER — Encounter (HOSPITAL_BASED_OUTPATIENT_CLINIC_OR_DEPARTMENT_OTHER): Payer: Medicare Other | Admitting: Oncology

## 2011-08-12 DIAGNOSIS — D649 Anemia, unspecified: Secondary | ICD-10-CM

## 2011-08-12 DIAGNOSIS — C211 Malignant neoplasm of anal canal: Secondary | ICD-10-CM

## 2011-08-12 DIAGNOSIS — D709 Neutropenia, unspecified: Secondary | ICD-10-CM

## 2011-08-12 LAB — CBC WITH DIFFERENTIAL/PLATELET
BASO%: 1.2 % (ref 0.0–2.0)
HCT: 33 % — ABNORMAL LOW (ref 38.4–49.9)
LYMPH%: 61.2 % — ABNORMAL HIGH (ref 14.0–49.0)
MCH: 29.1 pg (ref 27.2–33.4)
MCHC: 33 g/dL (ref 32.0–36.0)
MONO#: 0.4 10*3/uL (ref 0.1–0.9)
NEUT%: 17.2 % — ABNORMAL LOW (ref 39.0–75.0)
Platelets: 157 10*3/uL (ref 140–400)
WBC: 2.5 10*3/uL — ABNORMAL LOW (ref 4.0–10.3)

## 2011-08-12 LAB — TECHNOLOGIST REVIEW

## 2011-08-12 LAB — COMPREHENSIVE METABOLIC PANEL
AST: 13 U/L (ref 0–37)
Albumin: 4.1 g/dL (ref 3.5–5.2)
BUN: 22 mg/dL (ref 6–23)
Calcium: 9 mg/dL (ref 8.4–10.5)
Chloride: 103 mEq/L (ref 96–112)
Glucose, Bld: 130 mg/dL — ABNORMAL HIGH (ref 70–99)
Potassium: 4.6 mEq/L (ref 3.5–5.3)
Total Protein: 6.5 g/dL (ref 6.0–8.3)

## 2011-08-26 ENCOUNTER — Encounter (HOSPITAL_BASED_OUTPATIENT_CLINIC_OR_DEPARTMENT_OTHER): Payer: Medicare Other | Admitting: Oncology

## 2011-08-26 ENCOUNTER — Other Ambulatory Visit (HOSPITAL_COMMUNITY): Payer: Self-pay | Admitting: Oncology

## 2011-08-26 DIAGNOSIS — D709 Neutropenia, unspecified: Secondary | ICD-10-CM

## 2011-08-26 LAB — CBC WITH DIFFERENTIAL/PLATELET
BASO%: 1.1 % (ref 0.0–2.0)
EOS%: 3.3 % (ref 0.0–7.0)
Eosinophils Absolute: 0.1 10*3/uL (ref 0.0–0.5)
MCHC: 34 g/dL (ref 32.0–36.0)
MCV: 89.2 fL (ref 79.3–98.0)
MONO%: 15.9 % — ABNORMAL HIGH (ref 0.0–14.0)
NEUT#: 0.6 10*3/uL — ABNORMAL LOW (ref 1.5–6.5)
RBC: 3.75 10*6/uL — ABNORMAL LOW (ref 4.20–5.82)
RDW: 18.1 % — ABNORMAL HIGH (ref 11.0–14.6)

## 2011-08-26 LAB — TECHNOLOGIST REVIEW

## 2011-08-27 LAB — BASIC METABOLIC PANEL
BUN: 26 mg/dL — ABNORMAL HIGH (ref 6–23)
Creatinine, Ser: 1.47 mg/dL — ABNORMAL HIGH (ref 0.50–1.35)
Potassium: 4.5 mEq/L (ref 3.5–5.3)

## 2011-08-27 LAB — CYCLOSPORINE LEVEL: Cyclosporine, Blood: 333 ng/mL — ABNORMAL HIGH (ref 140–330)

## 2011-08-31 ENCOUNTER — Encounter (HOSPITAL_BASED_OUTPATIENT_CLINIC_OR_DEPARTMENT_OTHER): Payer: Medicare Other | Admitting: Oncology

## 2011-08-31 ENCOUNTER — Other Ambulatory Visit (HOSPITAL_COMMUNITY): Payer: Self-pay | Admitting: Oncology

## 2011-08-31 DIAGNOSIS — D709 Neutropenia, unspecified: Secondary | ICD-10-CM

## 2011-08-31 DIAGNOSIS — C211 Malignant neoplasm of anal canal: Secondary | ICD-10-CM

## 2011-08-31 DIAGNOSIS — D649 Anemia, unspecified: Secondary | ICD-10-CM

## 2011-09-01 LAB — CYCLOSPORINE LEVEL: Cyclosporine, Blood: 45 ng/mL — ABNORMAL LOW (ref 140–330)

## 2011-09-06 ENCOUNTER — Encounter (HOSPITAL_BASED_OUTPATIENT_CLINIC_OR_DEPARTMENT_OTHER): Payer: Medicare Other | Admitting: Oncology

## 2011-09-06 ENCOUNTER — Other Ambulatory Visit (HOSPITAL_COMMUNITY): Payer: Self-pay | Admitting: Oncology

## 2011-09-06 DIAGNOSIS — C211 Malignant neoplasm of anal canal: Secondary | ICD-10-CM

## 2011-09-09 ENCOUNTER — Encounter: Payer: Self-pay | Admitting: *Deleted

## 2011-09-10 ENCOUNTER — Encounter (HOSPITAL_BASED_OUTPATIENT_CLINIC_OR_DEPARTMENT_OTHER): Payer: Medicare Other | Admitting: Oncology

## 2011-09-10 ENCOUNTER — Other Ambulatory Visit (HOSPITAL_COMMUNITY): Payer: Self-pay | Admitting: Oncology

## 2011-09-10 DIAGNOSIS — C211 Malignant neoplasm of anal canal: Secondary | ICD-10-CM

## 2011-09-10 DIAGNOSIS — D649 Anemia, unspecified: Secondary | ICD-10-CM

## 2011-09-10 DIAGNOSIS — D709 Neutropenia, unspecified: Secondary | ICD-10-CM

## 2011-09-10 DIAGNOSIS — R197 Diarrhea, unspecified: Secondary | ICD-10-CM

## 2011-09-10 LAB — CBC WITH DIFFERENTIAL/PLATELET
BASO%: 1 % (ref 0.0–2.0)
EOS%: 3.2 % (ref 0.0–7.0)
Eosinophils Absolute: 0.1 10*3/uL (ref 0.0–0.5)
LYMPH%: 49.4 % — ABNORMAL HIGH (ref 14.0–49.0)
MCH: 29.5 pg (ref 27.2–33.4)
MCHC: 33.5 g/dL (ref 32.0–36.0)
MCV: 87.9 fL (ref 79.3–98.0)
MONO%: 25.8 % — ABNORMAL HIGH (ref 0.0–14.0)
Platelets: 122 10*3/uL — ABNORMAL LOW (ref 140–400)
RBC: 3.73 10*6/uL — ABNORMAL LOW (ref 4.20–5.82)
RDW: 15.8 % — ABNORMAL HIGH (ref 11.0–14.6)
nRBC: 0 % (ref 0–0)

## 2011-09-10 LAB — COMPREHENSIVE METABOLIC PANEL
ALT: 10 U/L (ref 0–53)
AST: 16 U/L (ref 0–37)
BUN: 26 mg/dL — ABNORMAL HIGH (ref 6–23)
Calcium: 8.9 mg/dL (ref 8.4–10.5)
Chloride: 101 mEq/L (ref 96–112)
Creatinine, Ser: 1.23 mg/dL (ref 0.50–1.35)
Total Bilirubin: 0.4 mg/dL (ref 0.3–1.2)

## 2011-09-10 LAB — URIC ACID: Uric Acid, Serum: 11.1 mg/dL — ABNORMAL HIGH (ref 4.0–7.8)

## 2011-09-10 LAB — TECHNOLOGIST REVIEW

## 2011-09-17 ENCOUNTER — Other Ambulatory Visit (HOSPITAL_COMMUNITY): Payer: Self-pay | Admitting: Oncology

## 2011-09-17 ENCOUNTER — Encounter: Payer: Medicare Other | Admitting: Oncology

## 2011-09-17 LAB — URINALYSIS, ROUTINE W REFLEX MICROSCOPIC
Glucose, UA: NEGATIVE mg/dL
Ketones, ur: NEGATIVE mg/dL
Nitrite: NEGATIVE
Protein, ur: NEGATIVE mg/dL
Urobilinogen, UA: 0.2 mg/dL (ref 0.0–1.0)

## 2011-09-17 LAB — POCT CARDIAC MARKERS
CKMB, poc: 1.3 ng/mL (ref 1.0–8.0)
Myoglobin, poc: 446 ng/mL (ref 12–200)
Troponin i, poc: <0.05 ng/mL (ref 0.00–0.09)

## 2011-09-17 LAB — DIFFERENTIAL
Basophils Relative: 0 % (ref 0–1)
Eosinophils Absolute: 0.1 10*3/uL (ref 0.0–0.7)
Monocytes Relative: 6 % (ref 3–12)
Neutrophils Relative %: 82 % — ABNORMAL HIGH (ref 43–77)

## 2011-09-17 LAB — PROTIME-INR: INR: 1 (ref 0.00–1.49)

## 2011-09-17 LAB — COMPREHENSIVE METABOLIC PANEL
ALT: 22 U/L (ref 0–53)
Alkaline Phosphatase: 57 U/L (ref 39–117)
CO2: 30 mEq/L (ref 19–32)
Chloride: 97 mEq/L (ref 96–112)
Glucose, Bld: 166 mg/dL — ABNORMAL HIGH (ref 70–99)
Potassium: 4.3 mEq/L (ref 3.5–5.1)
Sodium: 135 mEq/L (ref 135–145)
Total Bilirubin: 0.7 mg/dL (ref 0.3–1.2)
Total Protein: 6.3 g/dL (ref 6.0–8.3)

## 2011-09-17 LAB — CBC
Hemoglobin: 14.1 g/dL (ref 13.0–17.0)
RBC: 4.67 MIL/uL (ref 4.22–5.81)
RDW: 12.9 % (ref 11.5–15.5)
WBC: 9.2 10*3/uL (ref 4.0–10.5)

## 2011-09-21 ENCOUNTER — Other Ambulatory Visit (HOSPITAL_COMMUNITY): Payer: Medicare Other | Admitting: Radiology

## 2011-09-21 LAB — IRON AND TIBC
%SAT: 18 % — ABNORMAL LOW (ref 20–55)
TIBC: 300 ug/dL (ref 215–435)
UIBC: 246 ug/dL (ref 125–400)

## 2011-09-21 LAB — TRANSFERRIN RECEPTOR, SOLUABLE: Transferrin Receptor, Soluble: 0.88 mg/L (ref 0.76–1.76)

## 2011-09-24 ENCOUNTER — Other Ambulatory Visit (HOSPITAL_COMMUNITY): Payer: Self-pay | Admitting: Oncology

## 2011-09-24 ENCOUNTER — Encounter (HOSPITAL_BASED_OUTPATIENT_CLINIC_OR_DEPARTMENT_OTHER): Payer: Medicare Other | Admitting: Oncology

## 2011-09-24 DIAGNOSIS — D709 Neutropenia, unspecified: Secondary | ICD-10-CM

## 2011-09-24 DIAGNOSIS — D649 Anemia, unspecified: Secondary | ICD-10-CM

## 2011-09-24 DIAGNOSIS — C211 Malignant neoplasm of anal canal: Secondary | ICD-10-CM

## 2011-09-24 LAB — CBC WITH DIFFERENTIAL/PLATELET
BASO%: 0.6 % (ref 0.0–2.0)
Eosinophils Absolute: 0.2 10*3/uL (ref 0.0–0.5)
LYMPH%: 35.6 % (ref 14.0–49.0)
MCHC: 33.2 g/dL (ref 32.0–36.0)
MONO#: 0.9 10*3/uL (ref 0.1–0.9)
NEUT#: 2.2 10*3/uL (ref 1.5–6.5)
Platelets: 134 10*3/uL — ABNORMAL LOW (ref 140–400)
RBC: 3.96 10*6/uL — ABNORMAL LOW (ref 4.20–5.82)
RDW: 15.7 % — ABNORMAL HIGH (ref 11.0–14.6)
WBC: 5.1 10*3/uL (ref 4.0–10.3)
nRBC: 0 % (ref 0–0)

## 2011-09-24 LAB — BASIC METABOLIC PANEL
BUN: 21 mg/dL (ref 6–23)
Chloride: 102 mEq/L (ref 96–112)
Creatinine, Ser: 1.21 mg/dL (ref 0.50–1.35)
Potassium: 4.7 mEq/L (ref 3.5–5.3)

## 2011-09-24 LAB — TECHNOLOGIST REVIEW

## 2011-09-29 ENCOUNTER — Ambulatory Visit (HOSPITAL_COMMUNITY): Payer: Medicare Other | Attending: Cardiology | Admitting: Radiology

## 2011-09-29 DIAGNOSIS — E785 Hyperlipidemia, unspecified: Secondary | ICD-10-CM | POA: Insufficient documentation

## 2011-09-29 DIAGNOSIS — I10 Essential (primary) hypertension: Secondary | ICD-10-CM | POA: Insufficient documentation

## 2011-09-29 DIAGNOSIS — I059 Rheumatic mitral valve disease, unspecified: Secondary | ICD-10-CM | POA: Insufficient documentation

## 2011-09-29 DIAGNOSIS — I079 Rheumatic tricuspid valve disease, unspecified: Secondary | ICD-10-CM | POA: Insufficient documentation

## 2011-09-29 DIAGNOSIS — E119 Type 2 diabetes mellitus without complications: Secondary | ICD-10-CM | POA: Insufficient documentation

## 2011-09-29 DIAGNOSIS — I428 Other cardiomyopathies: Secondary | ICD-10-CM | POA: Insufficient documentation

## 2011-09-29 LAB — CBC
HCT: 28 — ABNORMAL LOW
MCHC: 35.4
Platelets: 155
Platelets: 176
RBC: 2.71 — ABNORMAL LOW
RBC: 3.18 — ABNORMAL LOW
RDW: 13.1
WBC: 8.3
WBC: 9.5
WBC: 9.5

## 2011-09-29 LAB — PROTIME-INR
INR: 1.2
INR: 1.6 — ABNORMAL HIGH
INR: 2.3 — ABNORMAL HIGH
Prothrombin Time: 15.9 — ABNORMAL HIGH
Prothrombin Time: 19.5 — ABNORMAL HIGH
Prothrombin Time: 22.2 — ABNORMAL HIGH

## 2011-09-29 LAB — CROSSMATCH
ABO/RH(D): O POS
Antibody Screen: NEGATIVE

## 2011-09-29 LAB — BASIC METABOLIC PANEL
BUN: 18
BUN: 18
CO2: 30
CO2: 30
Calcium: 8.1 — ABNORMAL LOW
Calcium: 8.2 — ABNORMAL LOW
Calcium: 8.4
Creatinine, Ser: 0.94
Creatinine, Ser: 1.01
GFR calc Af Amer: >60
GFR calc Af Amer: >60
GFR calc Af Amer: >60
GFR calc non Af Amer: >60
GFR calc non Af Amer: >60
GFR calc non Af Amer: >60
GFR calc non Af Amer: >60
Glucose, Bld: 222 — ABNORMAL HIGH
Potassium: 4.5
Sodium: 132 — ABNORMAL LOW

## 2011-09-29 LAB — ABO/RH: ABO/RH(D): O POS

## 2011-09-30 LAB — PROTIME-INR
INR: 1
Prothrombin Time: 13.8

## 2011-09-30 LAB — COMPREHENSIVE METABOLIC PANEL
Alkaline Phosphatase: 54
BUN: 10
CO2: 30
Chloride: 99
Glucose, Bld: 175 — ABNORMAL HIGH
Potassium: 4.2
Total Bilirubin: 1

## 2011-09-30 LAB — CBC
HCT: 42.4
Hemoglobin: 14.3
RDW: 13.1
WBC: 6

## 2011-09-30 LAB — URINALYSIS, ROUTINE W REFLEX MICROSCOPIC
Bilirubin Urine: NEGATIVE
Hgb urine dipstick: NEGATIVE
Protein, ur: NEGATIVE
Urobilinogen, UA: 1

## 2011-10-01 ENCOUNTER — Other Ambulatory Visit (HOSPITAL_COMMUNITY): Payer: Self-pay | Admitting: Oncology

## 2011-10-01 ENCOUNTER — Encounter (HOSPITAL_BASED_OUTPATIENT_CLINIC_OR_DEPARTMENT_OTHER): Payer: Medicare Other | Admitting: Oncology

## 2011-10-01 DIAGNOSIS — D709 Neutropenia, unspecified: Secondary | ICD-10-CM

## 2011-10-01 LAB — CBC WITH DIFFERENTIAL/PLATELET
Eosinophils Absolute: 0.1 10*3/uL (ref 0.0–0.5)
HCT: 33.7 % — ABNORMAL LOW (ref 38.4–49.9)
LYMPH%: 48.7 % (ref 14.0–49.0)
MCHC: 33.5 g/dL (ref 32.0–36.0)
MCV: 88.9 fL (ref 79.3–98.0)
MONO%: 21.8 % — ABNORMAL HIGH (ref 0.0–14.0)
NEUT#: 0.9 10*3/uL — ABNORMAL LOW (ref 1.5–6.5)
NEUT%: 25.3 % — ABNORMAL LOW (ref 39.0–75.0)
Platelets: 125 10*3/uL — ABNORMAL LOW (ref 140–400)
RBC: 3.79 10*6/uL — ABNORMAL LOW (ref 4.20–5.82)
nRBC: 0 % (ref 0–0)

## 2011-10-01 LAB — BASIC METABOLIC PANEL
BUN: 37 mg/dL — ABNORMAL HIGH (ref 6–23)
CO2: 26 mEq/L (ref 19–32)
Glucose, Bld: 123 mg/dL — ABNORMAL HIGH (ref 70–99)
Potassium: 4.6 mEq/L (ref 3.5–5.3)

## 2011-10-05 ENCOUNTER — Other Ambulatory Visit (HOSPITAL_COMMUNITY): Payer: Self-pay | Admitting: Oncology

## 2011-10-06 LAB — CLOSTRIDIUM DIFFICILE EIA

## 2011-10-11 ENCOUNTER — Other Ambulatory Visit (HOSPITAL_COMMUNITY): Payer: Self-pay | Admitting: Oncology

## 2011-10-11 ENCOUNTER — Encounter (HOSPITAL_BASED_OUTPATIENT_CLINIC_OR_DEPARTMENT_OTHER): Payer: Medicare Other | Admitting: Oncology

## 2011-10-11 DIAGNOSIS — Z23 Encounter for immunization: Secondary | ICD-10-CM

## 2011-10-11 DIAGNOSIS — D709 Neutropenia, unspecified: Secondary | ICD-10-CM

## 2011-10-11 DIAGNOSIS — D72819 Decreased white blood cell count, unspecified: Secondary | ICD-10-CM

## 2011-10-11 LAB — CBC WITH DIFFERENTIAL/PLATELET
BASO%: 0.7 % (ref 0.0–2.0)
Basophils Absolute: 0 10*3/uL (ref 0.0–0.1)
EOS%: 2.3 % (ref 0.0–7.0)
HCT: 33.4 % — ABNORMAL LOW (ref 38.4–49.9)
HGB: 11.4 g/dL — ABNORMAL LOW (ref 13.0–17.1)
LYMPH%: 37.8 % (ref 14.0–49.0)
MCH: 31 pg (ref 27.2–33.4)
MCHC: 34 g/dL (ref 32.0–36.0)
MCV: 91.2 fL (ref 79.3–98.0)
MONO%: 8.5 % (ref 0.0–14.0)
NEUT%: 50.7 % (ref 39.0–75.0)
Platelets: 157 10*3/uL (ref 140–400)
lymph#: 1.6 10*3/uL (ref 0.9–3.3)

## 2011-10-12 ENCOUNTER — Telehealth: Payer: Self-pay | Admitting: *Deleted

## 2011-10-12 LAB — COMPREHENSIVE METABOLIC PANEL
ALT: 11 U/L (ref 0–53)
AST: 15 U/L (ref 0–37)
BUN: 28 mg/dL — ABNORMAL HIGH (ref 6–23)
Calcium: 9.2 mg/dL (ref 8.4–10.5)
Creatinine, Ser: 1.41 mg/dL — ABNORMAL HIGH (ref 0.50–1.35)
Total Bilirubin: 0.5 mg/dL (ref 0.3–1.2)

## 2011-10-12 LAB — CEA: CEA: 2.2 ng/mL (ref 0.0–5.0)

## 2011-10-12 LAB — CYCLOSPORINE LEVEL: Cyclosporine, Blood: 456 ng/mL — ABNORMAL HIGH (ref 140–330)

## 2011-10-13 ENCOUNTER — Other Ambulatory Visit: Payer: Self-pay | Admitting: Oncology

## 2011-10-13 ENCOUNTER — Encounter (HOSPITAL_BASED_OUTPATIENT_CLINIC_OR_DEPARTMENT_OTHER): Payer: Medicare Other | Admitting: Oncology

## 2011-10-13 ENCOUNTER — Other Ambulatory Visit (HOSPITAL_COMMUNITY): Payer: Self-pay | Admitting: Oncology

## 2011-10-13 DIAGNOSIS — D708 Other neutropenia: Secondary | ICD-10-CM | POA: Insufficient documentation

## 2011-10-13 DIAGNOSIS — D649 Anemia, unspecified: Secondary | ICD-10-CM

## 2011-10-13 DIAGNOSIS — C211 Malignant neoplasm of anal canal: Secondary | ICD-10-CM

## 2011-10-13 DIAGNOSIS — D709 Neutropenia, unspecified: Secondary | ICD-10-CM

## 2011-10-14 LAB — CYCLOSPORINE LEVEL: Cyclosporine, Blood: 144 ng/mL (ref 140–330)

## 2011-10-18 ENCOUNTER — Other Ambulatory Visit (HOSPITAL_COMMUNITY): Payer: Self-pay | Admitting: Oncology

## 2011-10-18 ENCOUNTER — Other Ambulatory Visit (HOSPITAL_BASED_OUTPATIENT_CLINIC_OR_DEPARTMENT_OTHER): Payer: Medicare Other | Admitting: Lab

## 2011-10-18 DIAGNOSIS — C211 Malignant neoplasm of anal canal: Secondary | ICD-10-CM

## 2011-10-18 DIAGNOSIS — D708 Other neutropenia: Secondary | ICD-10-CM

## 2011-10-18 DIAGNOSIS — D649 Anemia, unspecified: Secondary | ICD-10-CM

## 2011-10-18 DIAGNOSIS — D709 Neutropenia, unspecified: Secondary | ICD-10-CM

## 2011-10-18 NOTE — Progress Notes (Signed)
Cyclosporin level from yesterday 10/13/2011, drawn at 9:06 am was 144. This is a true trough level.  The normal range, according to the lab report is 140 to 330.  I believe that we have were shooting between 100 and 200 mg/mL as per the suggestions of Dr. Maren Reamer.    ______________________________ Samul Dada, M.D. DSM/MEDQ  D:  10/14/2011  T:  10/18/2011  Job:  [id number]

## 2011-10-25 ENCOUNTER — Other Ambulatory Visit: Payer: Self-pay | Admitting: Oncology

## 2011-10-28 ENCOUNTER — Other Ambulatory Visit: Payer: Self-pay

## 2011-10-28 ENCOUNTER — Telehealth: Payer: Self-pay | Admitting: Oncology

## 2011-10-28 ENCOUNTER — Telehealth: Payer: Self-pay

## 2011-10-28 DIAGNOSIS — Z862 Personal history of diseases of the blood and blood-forming organs and certain disorders involving the immune mechanism: Secondary | ICD-10-CM

## 2011-10-28 NOTE — Telephone Encounter (Signed)
Wife called stating pt is weak for several days, wanting to stay in bed. Stated he has not had labs in last 2 weeks. No other symptoms. I told her I will speak to Dr Arline Asp and tentatively set lab for 11/16 at about 10AM. Told her to expect call in AM and to call us if she doesn't hear from [place].

## 2011-10-28 NOTE — Telephone Encounter (Signed)
lmonvm for pt re appt for 11/16 @ 10 am.

## 2011-10-29 ENCOUNTER — Other Ambulatory Visit: Payer: Self-pay

## 2011-10-29 ENCOUNTER — Telehealth: Payer: Self-pay

## 2011-10-29 ENCOUNTER — Other Ambulatory Visit: Payer: Self-pay | Admitting: Oncology

## 2011-10-29 ENCOUNTER — Other Ambulatory Visit (HOSPITAL_COMMUNITY): Payer: Self-pay | Admitting: Oncology

## 2011-10-29 ENCOUNTER — Other Ambulatory Visit (HOSPITAL_BASED_OUTPATIENT_CLINIC_OR_DEPARTMENT_OTHER): Payer: Medicare Other | Admitting: Lab

## 2011-10-29 DIAGNOSIS — D708 Other neutropenia: Secondary | ICD-10-CM

## 2011-10-29 DIAGNOSIS — C211 Malignant neoplasm of anal canal: Secondary | ICD-10-CM

## 2011-10-29 DIAGNOSIS — Z862 Personal history of diseases of the blood and blood-forming organs and certain disorders involving the immune mechanism: Secondary | ICD-10-CM

## 2011-10-29 LAB — COMPREHENSIVE METABOLIC PANEL
ALT: 10 U/L (ref 0–53)
AST: 12 U/L (ref 0–37)
Albumin: 4.2 g/dL (ref 3.5–5.2)
Alkaline Phosphatase: 116 U/L (ref 39–117)
Potassium: 4.7 mEq/L (ref 3.5–5.3)
Sodium: 137 mEq/L (ref 135–145)
Total Protein: 6.3 g/dL (ref 6.0–8.3)

## 2011-10-29 LAB — IRON AND TIBC
TIBC: 311 ug/dL (ref 215–435)
UIBC: 199 ug/dL (ref 125–400)

## 2011-10-29 LAB — CBC WITH DIFFERENTIAL/PLATELET
Basophils Absolute: 0 10*3/uL (ref 0.0–0.1)
EOS%: 2 % (ref 0.0–7.0)
HCT: 33.9 % — ABNORMAL LOW (ref 38.4–49.9)
HGB: 11.2 g/dL — ABNORMAL LOW (ref 13.0–17.1)
LYMPH%: 32.8 % (ref 14.0–49.0)
MCH: 30.2 pg (ref 27.2–33.4)
MONO#: 0.4 10*3/uL (ref 0.1–0.9)
NEUT%: 54.5 % (ref 39.0–75.0)
Platelets: 158 10*3/uL (ref 140–400)
lymph#: 1.3 10*3/uL (ref 0.9–3.3)

## 2011-10-29 LAB — FERRITIN: Ferritin: 232 ng/mL (ref 22–322)

## 2011-10-29 NOTE — Telephone Encounter (Signed)
Spoke w/wife, Dr Arline Asp does not feel pt needs iron at the present. Dr Arline Asp felt pt could go to PCP if continues weak and pale. I added cyclosporin level to next lab visit after reading Dr Murinson's last progress note so do not give pt cyclosporin that morning before lab draw. Wife expressed understanding.

## 2011-11-05 ENCOUNTER — Telehealth: Payer: Self-pay | Admitting: Oncology

## 2011-11-05 ENCOUNTER — Telehealth: Payer: Self-pay | Admitting: Medical Oncology

## 2011-11-05 ENCOUNTER — Other Ambulatory Visit: Payer: Medicare Other

## 2011-11-05 NOTE — Telephone Encounter (Signed)
I called pt and spoke with his wife [name] regarding his 1:45pm lab appointment. Pt is scheduled to have his CBC and his cyclosporin level drawn. Pt needs early am lab so the results will be accurate. She stated when making the appointment they were told he could not get an early appoint time due tot he holiday. I told her we need to reschedule to early Monday AM. Transferred her to scheduling.

## 2011-11-05 NOTE — Telephone Encounter (Signed)
Per robin r/s 11/23 lb to 11/26 in the AM. lmonvm for pt re new appt for 11/26 @ 9:15 am. Other appts remain the same.

## 2011-11-08 ENCOUNTER — Other Ambulatory Visit: Payer: Medicare Other | Admitting: Lab

## 2011-11-09 ENCOUNTER — Other Ambulatory Visit (HOSPITAL_COMMUNITY): Payer: Self-pay | Admitting: Oncology

## 2011-11-09 ENCOUNTER — Other Ambulatory Visit (HOSPITAL_BASED_OUTPATIENT_CLINIC_OR_DEPARTMENT_OTHER): Payer: Medicare Other | Admitting: Lab

## 2011-11-09 DIAGNOSIS — C211 Malignant neoplasm of anal canal: Secondary | ICD-10-CM

## 2011-11-09 DIAGNOSIS — D708 Other neutropenia: Secondary | ICD-10-CM

## 2011-11-09 LAB — CBC WITH DIFFERENTIAL/PLATELET
BASO%: 0.5 % (ref 0.0–2.0)
Eosinophils Absolute: 0.1 10*3/uL (ref 0.0–0.5)
MCHC: 33.8 g/dL (ref 32.0–36.0)
MONO#: 0.5 10*3/uL (ref 0.1–0.9)
NEUT#: 1.9 10*3/uL (ref 1.5–6.5)
RBC: 3.45 10*6/uL — ABNORMAL LOW (ref 4.20–5.82)
WBC: 3.9 10*3/uL — ABNORMAL LOW (ref 4.0–10.3)
lymph#: 1.4 10*3/uL (ref 0.9–3.3)

## 2011-11-09 LAB — CYCLOSPORINE LEVEL: Cyclosporine, Blood: 104 ng/mL — ABNORMAL LOW (ref 140–330)

## 2011-11-12 ENCOUNTER — Other Ambulatory Visit: Payer: Medicare Other | Admitting: Lab

## 2011-11-15 ENCOUNTER — Telehealth: Payer: Self-pay | Admitting: Oncology

## 2011-11-15 NOTE — Telephone Encounter (Signed)
called pts home s/w and informed her that pts appt for 12/06 was changed to 11/23/2011.  wife confirmed appt

## 2011-11-18 ENCOUNTER — Ambulatory Visit: Payer: Medicare Other | Admitting: Physician Assistant

## 2011-11-18 ENCOUNTER — Other Ambulatory Visit: Payer: Medicare Other | Admitting: Lab

## 2011-11-18 DIAGNOSIS — N400 Enlarged prostate without lower urinary tract symptoms: Secondary | ICD-10-CM | POA: Insufficient documentation

## 2011-11-23 ENCOUNTER — Other Ambulatory Visit (HOSPITAL_COMMUNITY): Payer: Self-pay | Admitting: Oncology

## 2011-11-23 ENCOUNTER — Ambulatory Visit (HOSPITAL_BASED_OUTPATIENT_CLINIC_OR_DEPARTMENT_OTHER): Payer: Medicare Other | Admitting: Oncology

## 2011-11-23 ENCOUNTER — Other Ambulatory Visit (HOSPITAL_BASED_OUTPATIENT_CLINIC_OR_DEPARTMENT_OTHER): Payer: Medicare Other | Admitting: Lab

## 2011-11-23 VITALS — BP 150/71 | HR 62 | Temp 97.1°F | Ht 75.0 in | Wt 198.0 lb

## 2011-11-23 DIAGNOSIS — D708 Other neutropenia: Secondary | ICD-10-CM

## 2011-11-23 DIAGNOSIS — C187 Malignant neoplasm of sigmoid colon: Secondary | ICD-10-CM

## 2011-11-23 DIAGNOSIS — D709 Neutropenia, unspecified: Secondary | ICD-10-CM

## 2011-11-23 DIAGNOSIS — C211 Malignant neoplasm of anal canal: Secondary | ICD-10-CM

## 2011-11-23 DIAGNOSIS — D649 Anemia, unspecified: Secondary | ICD-10-CM

## 2011-11-23 LAB — CBC WITH DIFFERENTIAL/PLATELET
Basophils Absolute: 0 10*3/uL (ref 0.0–0.1)
EOS%: 0.9 % (ref 0.0–7.0)
HCT: 32.9 % — ABNORMAL LOW (ref 38.4–49.9)
HGB: 11.1 g/dL — ABNORMAL LOW (ref 13.0–17.1)
LYMPH%: 20.5 % (ref 14.0–49.0)
MCH: 32.2 pg (ref 27.2–33.4)
MCHC: 33.7 g/dL (ref 32.0–36.0)
MCV: 95.6 fL (ref 79.3–98.0)
MONO%: 3.1 % (ref 0.0–14.0)
NEUT%: 75.2 % — ABNORMAL HIGH (ref 39.0–75.0)
Platelets: 176 10*3/uL (ref 140–400)
lymph#: 1.8 10*3/uL (ref 0.9–3.3)

## 2011-11-23 NOTE — Progress Notes (Signed)
CC:   [name]. [name], M.D. [name]. [name] Motto, MD, [name], FACP, FAGA [name] Fee, MD [name], M.D. 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  [name] was last seen by [place] on 10/11/2011.  We also follow the patient for a history of colon cancer detected in September 2011 when the patient was found to have iron deficiency anemia. The patient is accompanied by his wife, [name].  [name] has been on Neupogen 480 mcg subcu daily for a rather extended period of time, possibly the last year or so.  We had started him on cyclosporin in late August 2012 initially at 75 mg b.i.d.  Approximately 2 months ago, the dose was increased to 100 mg b.i.d.  The patient seems to be tolerating this well without side effects and no evidence for renal decompensation. Over the past couple of months, he has done well.  There has been no fever or infections, any of these spells that have been labeled seizures.  No falling episodes.  The patient does have intermittent diarrhea.  Apparently at night, he has some seepage needs to wear a pullup.  He has been eating well and his weight has increased.  The patient says his energy is fine.  However, his wife contradicts him and says that his energy is not fine.  There has been no recent change.  All in all, it has been a fairly uneventful couple of months fortunately and the patient seems to be doing well.  PROBLEM LIST: 1. 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  The patient     had bone marrow on 04/03/2009 and again on 04/08/2010.  FISH     studies were negative as was the bone marrow.  Currently, the     patient is on Neupogen 480 mcg subcu daily and cyclosporin 100 mg     b.i.d.  His white count and ANC have been normal the past couple of     months. 2. 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  The tumor was located in the proximal rectum first     detected on colonoscopy on 09/03/2010.  Laparoscopic-assisted low     anterior resection with a diverting loop ileostomy was carried out     on 12/23/2010.  Closure of the loop ileostomy occurred on     04/06/2011.  The patient is without evidence of recurrence. 3. Renal insufficiency. 4. Hyperuricemia. 5. Anemia possibly related to renal insufficiency. 6. Possible seizures dating back to December 2009. 7. History of C difficile diarrhea. 8. Diabetes mellitus. 9. Hypertension. 10.Dyslipidemia. 11.BPH. 12.History of skin cancers. 13.Status post left total hip replacement. 14.Coronary artery disease.  MEDICINES: 1. Allopurinol 100 mg daily. 2. Aspirin 81 mg daily. 3. Vitamin D3 5000 units daily. 4. Cyclosporin 100 mg twice a day. 5. Cardura. 6. Proscar. 7. HydroDIURIL. 8. Regular insulin. 9. Zestril. 10.Toprol-XL. 11.Neupogen 480 mcg subcu daily. 12.Pravachol. 13.Topamax 50 mg in the morning and 100 mg in the evening.  PHYSICAL EXAM:  [name] looks well.  He has gained approximately 10 pounds.  His weight is now 199 pounds.  Height 6 feet 3 inches.  Body surface area 2.18 sq m.  Blood pressure  150/71 in the left arm sitting. Other vital signs are normal.  He is afebrile.  As stated, he has had no fever or signs of infection.  HEENT:  There is no scleral icterus. Mouth and pharynx benign.  No peripheral adenopathy palpable.  Heart and Lungs:  Normal.  Abdomen:  Benign with no organomegaly or masses palpable.  Extremities:  Trace to 1+, puffy ankles bilaterally.  LABORATORY DATA:  Today, white count 8.9, ANC 6.7, hemoglobin 11.1, hematocrit 32.9, platelets 176,000.  The patient's white count and ANC have been in the normal range for approximately the last 2 months.   The patient's mild anemia remains stable.  Chemistries today:  Uric acid, cyclosporin level on whole blood, trough level are all pending. Chemistries from 10/29/2011 were notable for a BUN of 31, creatinine 1.41, albumin 4.2.  Uric acid from 10/18/2011 was 6.7; in the past, this was markedly elevated up to 13.8 on 08/12/2011.  Iron studies from 11/16:  Ferritin was 232, iron saturation 36.  Cyclosporin level on 11/09/2011 was 104.  On 11/05 125 and on 10/31 144.  C difficile on 10/05/2011 was negative.  The patient's last CEA was 2.2 on 10/11/2011. Last imaging study was a CT angiogram of the chest on 05/01/2011 that was negative.  IMPRESSION AND PLAN:  [name] seems to be doing well from the standpoint of his autoimmune neutropenia as well as his history of colon cancer.  Other problems seem to be under good control.  As stated, he has not had any of these seizure-like episodes during the last few months.  We are going to try to taper the Neupogen.  The patient's wife was instructed to go on an every-other-day schedule for Neupogen.  We will keep the cyclosporin level at the same dose 100 mg b.i.d.  Cyclosporin level today is pending.  The patient did not take his cyclosporin dose yet.  At this point, we will check CBC every 2 weeks.  We will check a cyclosporin level every 4 weeks.  I will plan to see [name] again in 8 weeks which will be around February 5th.  At that time, we will check CBC, chemistries, LDH, uric acid, CEA, and a cyclosporin level.  The patient's last colonoscopy was at the time of diagnosis on 09/03/2010.  I am going to refer him back to  GI for his 1-year anniversary colonoscopy.  The patient and his wife were instructed to call us if there should be any change in his condition, specifically any fever or signs of infection.    ______________________________ Samul Dada, M.D. DSM/MEDQ  D:  11/23/2011  T:  11/23/2011  Job:  [id number]

## 2011-11-23 NOTE — Progress Notes (Signed)
This office note has been dictated.  #[id number]

## 2011-11-24 ENCOUNTER — Other Ambulatory Visit: Payer: Self-pay | Admitting: Nurse Practitioner

## 2011-11-24 LAB — COMPREHENSIVE METABOLIC PANEL
Albumin: 4 g/dL (ref 3.5–5.2)
CO2: 25 mEq/L (ref 19–32)
Glucose, Bld: 158 mg/dL — ABNORMAL HIGH (ref 70–99)
Potassium: 4.5 mEq/L (ref 3.5–5.3)
Sodium: 143 mEq/L (ref 135–145)
Total Protein: 6.3 g/dL (ref 6.0–8.3)

## 2011-11-24 LAB — LACTATE DEHYDROGENASE: LDH: 112 U/L (ref 94–250)

## 2011-11-24 LAB — URIC ACID: Uric Acid, Serum: 7.1 mg/dL (ref 4.0–7.8)

## 2011-11-26 ENCOUNTER — Telehealth: Payer: Self-pay | Admitting: *Deleted

## 2011-11-26 ENCOUNTER — Telehealth: Payer: Self-pay | Admitting: Oncology

## 2011-11-26 NOTE — Telephone Encounter (Signed)
S/w the pt's wife regarding the lab appt and the appt to see dr Jarold Motto at [facility] for the nurse eval and for the colonoscopy. Pt's wife is aware to pick up an appt calendar at that time.

## 2011-11-26 NOTE — Telephone Encounter (Addendum)
Wife calling JY:[id number] level.  It is low.  What are Dr. Mamie Levers recommendations? Discussed with Dr. Arline Asp and he is aware, but does not want to make any changes at present.  Called wife back and let her know....she appreciated the call back.

## 2011-12-02 ENCOUNTER — Emergency Department (HOSPITAL_COMMUNITY)
Admission: EM | Admit: 2011-12-02 | Discharge: 2011-12-03 | Disposition: A | Discharge status: Home / Self Care | Payer: Medicare Other | Attending: Emergency Medicine | Admitting: Emergency Medicine

## 2011-12-02 ENCOUNTER — Encounter (HOSPITAL_COMMUNITY): Payer: Self-pay | Admitting: Emergency Medicine

## 2011-12-02 DIAGNOSIS — Z794 Long term (current) use of insulin: Secondary | ICD-10-CM | POA: Insufficient documentation

## 2011-12-02 DIAGNOSIS — E119 Type 2 diabetes mellitus without complications: Secondary | ICD-10-CM | POA: Insufficient documentation

## 2011-12-02 DIAGNOSIS — R509 Fever, unspecified: Secondary | ICD-10-CM

## 2011-12-02 DIAGNOSIS — I10 Essential (primary) hypertension: Secondary | ICD-10-CM | POA: Insufficient documentation

## 2011-12-02 DIAGNOSIS — Z8673 Personal history of transient ischemic attack (TIA), and cerebral infarction without residual deficits: Secondary | ICD-10-CM | POA: Insufficient documentation

## 2011-12-03 ENCOUNTER — Ambulatory Visit (INDEPENDENT_AMBULATORY_CARE_PROVIDER_SITE_OTHER): Payer: Self-pay | Admitting: Surgery

## 2011-12-03 ENCOUNTER — Emergency Department (HOSPITAL_COMMUNITY): Payer: Medicare Other

## 2011-12-03 LAB — DIFFERENTIAL
Basophils Absolute: 0 10*3/uL (ref 0.0–0.1)
Eosinophils Absolute: 0 10*3/uL (ref 0.0–0.7)
Lymphs Abs: 0.4 10*3/uL — ABNORMAL LOW (ref 0.7–4.0)
Neutro Abs: 2.1 10*3/uL (ref 1.7–7.7)

## 2011-12-03 LAB — URINALYSIS, ROUTINE W REFLEX MICROSCOPIC
Bilirubin Urine: NEGATIVE
Ketones, ur: NEGATIVE mg/dL
Leukocytes, UA: NEGATIVE
Nitrite: NEGATIVE
Specific Gravity, Urine: 1.015 (ref 1.005–1.030)
Urobilinogen, UA: 2 mg/dL — ABNORMAL HIGH (ref 0.0–1.0)

## 2011-12-03 LAB — COMPREHENSIVE METABOLIC PANEL
ALT: 11 U/L (ref 0–53)
Alkaline Phosphatase: 105 U/L (ref 39–117)
BUN: 32 mg/dL — ABNORMAL HIGH (ref 6–23)
CO2: 23 mEq/L (ref 19–32)
Calcium: 8.7 mg/dL (ref 8.4–10.5)
GFR calc Af Amer: 67 mL/min — ABNORMAL LOW (ref >90)
GFR calc non Af Amer: 58 mL/min — ABNORMAL LOW (ref >90)
Glucose, Bld: 170 mg/dL — ABNORMAL HIGH (ref 70–99)
Sodium: 133 mEq/L — ABNORMAL LOW (ref 135–145)
Total Protein: 6.3 g/dL (ref 6.0–8.3)

## 2011-12-03 LAB — CBC
HCT: 30.3 % — ABNORMAL LOW (ref 39.0–52.0)
Hemoglobin: 10.4 g/dL — ABNORMAL LOW (ref 13.0–17.0)
MCH: 31.5 pg (ref 26.0–34.0)
MCHC: 34.3 g/dL (ref 30.0–36.0)
RBC: 3.3 MIL/uL — ABNORMAL LOW (ref 4.22–5.81)

## 2011-12-03 MED ORDER — ACETAMINOPHEN 325 MG PO TABS
650.0000 mg | ORAL_TABLET | Freq: Once | ORAL | Status: AC
  Administered 2011-12-03: 650 mg via ORAL
  Filled 2011-12-03: qty 2

## 2011-12-03 MED ORDER — SODIUM CHLORIDE 0.9 % IV SOLN
Freq: Once | INTRAVENOUS | Status: AC
  Administered 2011-12-03: via INTRAVENOUS

## 2011-12-03 NOTE — ED Provider Notes (Signed)
History     CSN: [id number]  Arrival date & time 12/02/11  2132   First MD Initiated Contact with Patient 12/03/11 0147      Chief Complaint  Patient presents with  . Fever    Pt states that fever starting at 1900.  Pt has no other symptoms.  Pt has leukopenia     Patient is a 77 y.o. male presenting with fever. The history is provided by the patient and the spouse.  Fever Primary symptoms of the febrile illness include fever. Primary symptoms do not include fatigue, headaches, cough, wheezing, shortness of breath, abdominal pain, vomiting, diarrhea, altered mental status or rash. The current episode started today. This is a new problem. The problem has not changed since onset. Pt with h/o autoimmune neutropenia He started to have chills at home, tmax at 100.54F so he was brought in He has no other complaints at this time  Past Medical History  Diagnosis Date  . Aortic root dilatation   . DM2 (diabetes mellitus, type 2)   . Stroke   . HLD (hyperlipidemia)   . HTN (hypertension)   . CAD (coronary artery disease)     Presumed from previously mildy abnormal myoview;  myoview 4/12: EF 73%, no ischemia  . Leukopenia   . Pneumothorax   . Rectal cancer   . Cardiomyopathy     a. echo 5/12: EF 45-50%, mild LVH, grade 2 diast dysfxn, RAE  . Colon cancer   . Chronic kidney disease     renal insuff, increased uric acid  . Seizures   . Diabetes mellitus   . BPH (benign prostatic hyperplasia)     increased PSA    Past Surgical History  Procedure Date  . Hip arthroplasty   . Tonsillectomy   . S/p resection of rectal cancer   . Vasectomy     Family History  Problem Relation Age of Onset  . Alzheimer's disease    . Parkinsonism Other   . Colon cancer Other   . Parkinsonism Father     History  Substance Use Topics  . Smoking status: Never Smoker   . Smokeless tobacco: Not on file  . Alcohol Use: No      Review of Systems  Constitutional: Positive for fever.  Negative for fatigue.  Respiratory: Negative for cough, shortness of breath and wheezing.   Gastrointestinal: Negative for vomiting, abdominal pain and diarrhea.  Skin: Negative for rash.  Neurological: Negative for headaches.  Psychiatric/Behavioral: Negative for altered mental status.  All other systems reviewed and are negative.    Allergies  Review of patient's allergies indicates no known allergies.  Home Medications   Current Outpatient Rx  Name Route Sig Dispense Refill  . ALLOPURINOL 100 MG PO TABS Oral Take 100 mg by mouth daily.      . ASPIRIN 81 MG PO TABS Oral Take 81 mg by mouth daily.      Marland Kitchen VITAMIN D-3 5000 UNITS PO TABS Oral Take by mouth daily.     . CYCLOSPORINE 25 MG PO CAPS Oral Take 100 mg by mouth 2 (two) times daily.      Marland Kitchen DOXAZOSIN MESYLATE 4 MG PO TABS Oral Take 2 mg by mouth at bedtime.     Marland Kitchen FINASTERIDE 5 MG PO TABS Oral Take 5 mg by mouth daily.     . OMEGA-3 FATTY ACIDS 1000 MG PO CAPS Oral Take 2 g by mouth daily.      Marland Kitchen HYDROCHLOROTHIAZIDE 25  MG PO TABS Oral Take 25 mg by mouth every other day.      . INSULIN REGULAR HUMAN 100 UNIT/ML IJ SOLN  Sliding scale- 4 units 0-150 150-200 6 units 200-250 12 units, 250-300 16 units    . LISINOPRIL 10 MG PO TABS Oral Take 1 tablet (10 mg total) by mouth daily. 90 tablet 3  . METOPROLOL SUCCINATE ER 25 MG PO TB24  TAKE 1/2 TABLET DAILY 45 tablet 3  . NEUPOGEN 480 MCG/1.6ML IJ SOLN Injection Inject 480 mcg as directed as directed. Patient takes every other day now. Last had it today Thursday 12-02-11    . OXYCODONE-ACETAMINOPHEN 5-325 MG PO TABS Oral Take 2 tablets by mouth Every 4 hours as needed.    Marland Kitchen PRAVASTATIN SODIUM 20 MG PO TABS Oral Take 20 mg by mouth daily.      . TOPIRAMATE 50 MG PO TABS Oral Take 50 mg by mouth. TAKE 1 TAB IN THE AM AND 2 TABS IN THE PM       BP 152/52  Pulse 106  Temp(Src) 98.5 F (36.9 C) (Oral)  Resp 18  SpO2 94% BP 125/64  Pulse 78  Temp(Src) 99.5 F (37.5 C) (Oral)  Resp  16  SpO2 94%  Physical Exam  CONSTITUTIONAL: Well developed/well nourished HEAD AND FACE: Normocephalic/atraumatic EYES: EOMI/PERRL ENMT: Mucous membranes moist, pharynx normal NECK: supple no meningeal signs SPINE:entire spine nontender CV: S1/S2 noted, no murmurs/rubs/gallops noted LUNGS: Lungs are clear to auscultation bilaterally, no apparent distress ABDOMEN: soft, nontender, no rebound or guarding GU:no cva tenderness NEURO: Pt is awake/alert, moves all extremitiesx4 EXTREMITIES: pulses normal, full ROM SKIN: warm, color normal PSYCH: no abnormalities of mood noted   ED Course  Procedures   Labs Reviewed  CBC - Abnormal; Notable for the following:    WBC 3.1 (*)    RBC 3.30 (*)    Hemoglobin 10.4 (*)    HCT 30.3 (*)    Platelets 124 (*)    All other components within normal limits  COMPREHENSIVE METABOLIC PANEL - Abnormal; Notable for the following:    Sodium 133 (*)    Glucose, Bld 170 (*)    BUN 32 (*)    Albumin 3.4 (*)    GFR calc non Af Amer 58 (*)    GFR calc Af Amer 67 (*)    All other components within normal limits  URINALYSIS, ROUTINE W REFLEX MICROSCOPIC - Abnormal; Notable for the following:    Urobilinogen, UA 2.0 (*)    All other components within normal limits  DIFFERENTIAL   2:29 AM Awaiting differential and will call hem/onc for further instructions Pt stable and not septic appearing at this time   NO SIGNIFICANT NEUTROPENIA PT FEELS WELL, NO INFECTIOUS SOURCE IDENTIFIED STABLE FOR D/C  MDM  Nursing notes reviewed and considered in documentation All labs/vitals reviewed and considered Previous records reviewed and considered xrays reviewed and considered        Joya Gaskins, MD 12/03/11 [phone]

## 2011-12-08 ENCOUNTER — Telehealth: Payer: Self-pay | Admitting: Medical Oncology

## 2011-12-08 ENCOUNTER — Encounter: Payer: Self-pay | Admitting: Medical Oncology

## 2011-12-08 ENCOUNTER — Telehealth: Payer: Self-pay | Admitting: Oncology

## 2011-12-08 ENCOUNTER — Other Ambulatory Visit: Payer: Medicare Other | Admitting: Lab

## 2011-12-08 NOTE — Telephone Encounter (Signed)
Called pt, left message for nutrition consult for 12/15/11

## 2011-12-08 NOTE — Telephone Encounter (Signed)
Pt's wife called asking if pt is having his cyclosporin levels drawn tomorrow. If he is she needs to hold his am dose. Per Dr. Mamie Levers orders only a CBC. She voiced understanding.

## 2011-12-09 ENCOUNTER — Telehealth: Payer: Self-pay | Admitting: Medical Oncology

## 2011-12-09 ENCOUNTER — Other Ambulatory Visit (HOSPITAL_BASED_OUTPATIENT_CLINIC_OR_DEPARTMENT_OTHER): Payer: Medicare Other | Admitting: Lab

## 2011-12-09 DIAGNOSIS — D708 Other neutropenia: Secondary | ICD-10-CM

## 2011-12-09 LAB — CBC WITH DIFFERENTIAL/PLATELET
BASO%: 1.2 % (ref 0.0–2.0)
EOS%: 3.2 % (ref 0.0–7.0)
HCT: 32 % — ABNORMAL LOW (ref 38.4–49.9)
LYMPH%: 63.2 % — ABNORMAL HIGH (ref 14.0–49.0)
MCH: 31.8 pg (ref 27.2–33.4)
MCHC: 34 g/dL (ref 32.0–36.0)
MCV: 93.5 fL (ref 79.3–98.0)
MONO#: 0.4 10*3/uL (ref 0.1–0.9)
MONO%: 16.1 % — ABNORMAL HIGH (ref 0.0–14.0)
NEUT%: 16.3 % — ABNORMAL LOW (ref 39.0–75.0)
Platelets: 177 10*3/uL (ref 140–400)

## 2011-12-09 NOTE — Telephone Encounter (Signed)
I called pt's wife [name] to let her know that pt's white count has dropped to 2.4/0.4. Per Dr.Murinson he would like for pt to take his Neupogen daily. He is currently doing it every other day. She voiced understanding.

## 2011-12-09 NOTE — Telephone Encounter (Signed)
Pt's wife [name] called left a message  asking about pt's CBC

## 2011-12-16 ENCOUNTER — Telehealth: Payer: Self-pay | Admitting: *Deleted

## 2011-12-16 ENCOUNTER — Ambulatory Visit (AMBULATORY_SURGERY_CENTER): Payer: Medicare Other | Admitting: *Deleted

## 2011-12-16 VITALS — Ht 75.0 in | Wt 192.4 lb

## 2011-12-16 DIAGNOSIS — Z1211 Encounter for screening for malignant neoplasm of colon: Secondary | ICD-10-CM

## 2011-12-16 MED ORDER — PEG-KCL-NACL-NASULF-NA ASC-C 100 G PO SOLR: ORAL | Status: DC

## 2011-12-16 NOTE — Telephone Encounter (Signed)
Yes and yes 

## 2011-12-16 NOTE — Telephone Encounter (Signed)
Dr Maryagnes Amos is scheduled for recall colon 12/22/2011.  Recall assessment sheet says "No" to colon recall.  Dr. Christella Hartigan performed colonoscopy at [facility] 09/03/10.  Pt diagnosed with colon cancer.  (See procedure report in [facility] 09/03/10).  He said pt would need recall colonoscopy in 1 year with you.   Do you want to continue with colonoscopy as planned for 1/9?  Also, pt had Unasyn 3 GM IV prior to colonoscopy because of neutropenia.  Do you want him to have Unasyn prior to colonoscopy?  Thanks, Ezra Sites

## 2011-12-16 NOTE — Telephone Encounter (Signed)
Pt's wife notified that pt should arrive at 8:30 am day of procedure to receive IV antibiotic.  [name]

## 2011-12-17 ENCOUNTER — Telehealth: Payer: Self-pay | Admitting: Medical Oncology

## 2011-12-17 NOTE — Telephone Encounter (Signed)
[name] wife called to inform Dr. Arline Asp that she spoke with Dr. Maren Reamer yesterday. He told her he does receive the labs and office notes from Dr. Arline Asp. He currently would continue with the neupogen and cyclosporine as ordered. He really did not think they need to come back at this time. I will pass this information on to Dr. Arline Asp.

## 2011-12-21 ENCOUNTER — Other Ambulatory Visit (HOSPITAL_BASED_OUTPATIENT_CLINIC_OR_DEPARTMENT_OTHER): Payer: Medicare Other | Admitting: Lab

## 2011-12-21 DIAGNOSIS — D708 Other neutropenia: Secondary | ICD-10-CM

## 2011-12-21 LAB — CBC WITH DIFFERENTIAL/PLATELET
BASO%: 0.2 % (ref 0.0–2.0)
EOS%: 0.2 % (ref 0.0–7.0)
HCT: 35.4 % — ABNORMAL LOW (ref 38.4–49.9)
MCH: 31.2 pg (ref 27.2–33.4)
MCHC: 33.1 g/dL (ref 32.0–36.0)
NEUT%: 90.8 % — ABNORMAL HIGH (ref 39.0–75.0)
RBC: 3.75 10*6/uL — ABNORMAL LOW (ref 4.20–5.82)
lymph#: 2.9 10*3/uL (ref 0.9–3.3)

## 2011-12-22 ENCOUNTER — Encounter: Payer: Self-pay | Admitting: Gastroenterology

## 2011-12-22 ENCOUNTER — Ambulatory Visit (AMBULATORY_SURGERY_CENTER): Payer: Medicare Other | Admitting: Gastroenterology

## 2011-12-22 ENCOUNTER — Telehealth: Payer: Self-pay | Admitting: Gastroenterology

## 2011-12-22 VITALS — BP 133/61 | HR 63 | Temp 97.3°F | Resp 18 | Ht 75.0 in | Wt 192.0 lb

## 2011-12-22 DIAGNOSIS — Z1211 Encounter for screening for malignant neoplasm of colon: Secondary | ICD-10-CM

## 2011-12-22 DIAGNOSIS — Z9049 Acquired absence of other specified parts of digestive tract: Secondary | ICD-10-CM

## 2011-12-22 DIAGNOSIS — D72819 Decreased white blood cell count, unspecified: Secondary | ICD-10-CM | POA: Insufficient documentation

## 2011-12-22 DIAGNOSIS — Z9889 Other specified postprocedural states: Secondary | ICD-10-CM

## 2011-12-22 DIAGNOSIS — Z85048 Personal history of other malignant neoplasm of rectum, rectosigmoid junction, and anus: Secondary | ICD-10-CM

## 2011-12-22 LAB — CYCLOSPORINE LEVEL: Cyclosporine, Blood: 280 ng/mL (ref 140–330)

## 2011-12-22 LAB — GLUCOSE, CAPILLARY: Glucose-Capillary: 145 mg/dL — ABNORMAL HIGH (ref 70–99)

## 2011-12-22 MED ORDER — SODIUM CHLORIDE 0.9 % IV SOLN: 500.0000 mL | INTRAVENOUS | Status: DC

## 2011-12-22 MED ORDER — AMPICILLIN-SULBACTAM SODIUM 3 (2-1) G IV SOLR: 3.0000 g | Freq: Once | INTRAVENOUS | Status: DC

## 2011-12-22 NOTE — Progress Notes (Signed)
Patient did not experience any of the following events: a burn prior to discharge; a fall within the facility; wrong site/side/patient/procedure/implant event; or a hospital transfer or hospital admission upon discharge from the facility. (G8907) Patient with preoperative order for IV antibiotic SSI prophylaxis, antibiotic initiated on time. ([id number])  

## 2011-12-22 NOTE — Telephone Encounter (Signed)
Telephone note received from [facility], Ronaldo Miyamoto, stating they received an electronic order for Unasyn for this patient. Informed Ronaldo Miyamoto that the Unasyn was a one time order here at [facility] preprocedure and to discard prescription.

## 2011-12-22 NOTE — Op Note (Signed)
[facility] [address] [place], [place]  [address]  COLONOSCOPY PROCEDURE REPORT  PATIENT:  [name], [name]  MR#:  [id number] BIRTHDATE:  [birth year], 79 yrs. old  GENDER:  male ENDOSCOPIST:  Vania Rea. Jarold Motto, MD, [facility] REF. BY:  [name], M.D. PROCEDURE DATE:  12/22/2011 PROCEDURE:  Diagnostic Colonoscopy ASA CLASS:  Class III INDICATIONS:  history of colon cancer LEFT COLECTOMY SEPT 2011. MEDICATIONS:   propofol (Diprivan) 140 mg IV  DESCRIPTION OF PROCEDURE:   After the risks and benefits and of the procedure were explained, informed consent was obtained. Digital rectal exam was performed and revealed no abnormalities. The LB CF-[id number] endoscope was introduced through the anus and advanced to the cecum, which was identified by both the appendix and ileocecal valve.  The quality of the prep was Moviprep fair.  The instrument was then slowly withdrawn as the colon was fully examined. <<PROCEDUREIMAGES>>  FINDINGS:  There was a surgical anastomosis in the rectum. LOW ANASTOMOSIS APPEARS NORMAL.SEE PICTURES.  No polyps or cancers were seen.   Retroflexed views in the rectum revealed no abnormalities.    The scope was then withdrawn from the patient and the procedure completed.  COMPLICATIONS:  None ENDOSCOPIC IMPRESSION: 1) Anastomosis in the rectum 2) No polyps or cancers RECOMMENDATIONS: 1) Repeat Colonoscopy in 3 years. 2) Continue current medications  REPEAT EXAM:  No  ______________________________ Vania Rea. Jarold Motto, MD, Clementeen Graham  CC:  Harriette Bouillon, MDSteven Daub, MDBrian Ludwig Clarks, MD  n. Rosalie DoctorMarland Kitchen   Vania Rea. [name] at 12/22/2011 10:45 AM  Milana Huntsman, [phone]

## 2011-12-23 ENCOUNTER — Telehealth: Payer: Self-pay | Admitting: *Deleted

## 2011-12-23 NOTE — Telephone Encounter (Signed)

## 2012-01-04 ENCOUNTER — Other Ambulatory Visit: Payer: Medicare Other | Admitting: Lab

## 2012-01-04 ENCOUNTER — Other Ambulatory Visit (HOSPITAL_COMMUNITY): Payer: Self-pay | Admitting: Oncology

## 2012-01-04 DIAGNOSIS — D708 Other neutropenia: Secondary | ICD-10-CM

## 2012-01-04 LAB — CBC WITH DIFFERENTIAL/PLATELET
Basophils Absolute: 0.1 10*3/uL (ref 0.0–0.1)
Eosinophils Absolute: 0.1 10*3/uL (ref 0.0–0.5)
HGB: 11.4 g/dL — ABNORMAL LOW (ref 13.0–17.1)
MCV: 93.3 fL (ref 79.3–98.0)
MONO#: 0.5 10*3/uL (ref 0.1–0.9)
MONO%: 2 % (ref 0.0–14.0)
NEUT#: 22.9 10*3/uL — ABNORMAL HIGH (ref 1.5–6.5)
RBC: 3.71 10*6/uL — ABNORMAL LOW (ref 4.20–5.82)
RDW: 13.9 % (ref 11.0–14.6)

## 2012-01-10 ENCOUNTER — Ambulatory Visit (INDEPENDENT_AMBULATORY_CARE_PROVIDER_SITE_OTHER): Payer: Medicare Other | Admitting: Surgery

## 2012-01-10 ENCOUNTER — Other Ambulatory Visit (HOSPITAL_COMMUNITY): Payer: Self-pay | Admitting: Oncology

## 2012-01-10 ENCOUNTER — Encounter (INDEPENDENT_AMBULATORY_CARE_PROVIDER_SITE_OTHER): Payer: Self-pay | Admitting: Surgery

## 2012-01-10 ENCOUNTER — Other Ambulatory Visit: Payer: Self-pay | Admitting: Medical Oncology

## 2012-01-10 VITALS — BP 144/78 | HR 72 | Temp 97.4°F | Resp 16 | Ht 75.0 in | Wt 191.2 lb

## 2012-01-10 DIAGNOSIS — D708 Other neutropenia: Secondary | ICD-10-CM

## 2012-01-10 DIAGNOSIS — Z85048 Personal history of other malignant neoplasm of rectum, rectosigmoid junction, and anus: Secondary | ICD-10-CM

## 2012-01-10 MED ORDER — FILGRASTIM 480 MCG/1.6ML IJ SOLN: 480.0000 ug | Freq: Every day | INTRAMUSCULAR | Status: DC

## 2012-01-10 NOTE — Patient Instructions (Signed)
Follow up 1 year 

## 2012-01-10 NOTE — Progress Notes (Signed)
Subjective:     Patient ID: [name], male   DOB: [birth year], 78 y.o.   MRN: [id number]  HPI  The patient returns to clinic today to 2 history of rectal cancer stage I treated with laparoscopic low anterior resection with diverting loop ileostomy with closure of this in May 2012. He has chronic neutropenia. He is followed closely by Dr. Arline Asp  Review of Systems  Constitutional: Positive for fatigue.  HENT: Negative.   Eyes: Negative.   Respiratory: Negative.   Cardiovascular: Negative.   Gastrointestinal: Negative.        Objective:   Physical Exam  Constitutional: He appears well-developed and well-nourished.  HENT:  Head: Normocephalic and atraumatic.  Cardiovascular: Normal rate and regular rhythm.   Pulmonary/Chest: Effort normal and breath sounds normal.  Abdominal:    Colonoscopy 2012 no evidence of recurrent disease.     Assessment:     Stage 1 rectal cancer history.    Plan:     Return in 1 year.  He is doing well. Dr Arline Asp is following.

## 2012-01-13 ENCOUNTER — Telehealth: Payer: Self-pay | Admitting: Oncology

## 2012-01-13 ENCOUNTER — Telehealth: Payer: Self-pay | Admitting: Medical Oncology

## 2012-01-13 NOTE — Telephone Encounter (Signed)
Pt wife called in regarding appt next week.  York Spaniel that she thinks husband needs to be seen sooner as he isn't feeling well and is very weak.  Wife stated she called and lvm for nurse yesterday. Transferred her to nurse and also gave my direct phone number to call back if she didn't get a resolution.

## 2012-01-13 NOTE — Telephone Encounter (Signed)
Pt's wife [name] called stating that her husband is not feeling well and is wanting to know if we can draw his labs before next week.I told her we can move his labs to tomorrow and then he will see Dr. Arline Asp on Tues. She requested pt come around 11 am 01/14/12

## 2012-01-14 ENCOUNTER — Ambulatory Visit (HOSPITAL_BASED_OUTPATIENT_CLINIC_OR_DEPARTMENT_OTHER): Payer: Medicare Other

## 2012-01-14 DIAGNOSIS — C187 Malignant neoplasm of sigmoid colon: Secondary | ICD-10-CM

## 2012-01-14 DIAGNOSIS — D708 Other neutropenia: Secondary | ICD-10-CM

## 2012-01-14 LAB — LACTATE DEHYDROGENASE: LDH: 129 U/L (ref 94–250)

## 2012-01-14 LAB — URIC ACID: Uric Acid, Serum: 6.7 mg/dL (ref 4.0–7.8)

## 2012-01-14 LAB — CBC WITH DIFFERENTIAL/PLATELET
Basophils Absolute: 0 10*3/uL (ref 0.0–0.1)
Eosinophils Absolute: 0.1 10*3/uL (ref 0.0–0.5)
HCT: 35.5 % — ABNORMAL LOW (ref 38.4–49.9)
HGB: 11.7 g/dL — ABNORMAL LOW (ref 13.0–17.1)
LYMPH%: 6.2 % — ABNORMAL LOW (ref 14.0–49.0)
MCV: 95.8 fL (ref 79.3–98.0)
MONO#: 0.3 10*3/uL (ref 0.1–0.9)
MONO%: 0.9 % (ref 0.0–14.0)
NEUT#: 26.8 10*3/uL — ABNORMAL HIGH (ref 1.5–6.5)
NEUT%: 92.4 % — ABNORMAL HIGH (ref 39.0–75.0)
Platelets: 181 10*3/uL (ref 140–400)
RBC: 3.71 10*6/uL — ABNORMAL LOW (ref 4.20–5.82)
WBC: 29 10*3/uL — ABNORMAL HIGH (ref 4.0–10.3)

## 2012-01-14 LAB — COMPREHENSIVE METABOLIC PANEL
Alkaline Phosphatase: 217 U/L — ABNORMAL HIGH (ref 39–117)
BUN: 28 mg/dL — ABNORMAL HIGH (ref 6–23)
CO2: 26 mEq/L (ref 19–32)
Glucose, Bld: 211 mg/dL — ABNORMAL HIGH (ref 70–99)
Sodium: 139 mEq/L (ref 135–145)
Total Bilirubin: 0.4 mg/dL (ref 0.3–1.2)
Total Protein: 6.3 g/dL (ref 6.0–8.3)

## 2012-01-18 ENCOUNTER — Other Ambulatory Visit: Payer: Medicare Other | Admitting: Lab

## 2012-01-18 ENCOUNTER — Encounter: Payer: Self-pay | Admitting: Oncology

## 2012-01-18 ENCOUNTER — Ambulatory Visit (HOSPITAL_BASED_OUTPATIENT_CLINIC_OR_DEPARTMENT_OTHER): Payer: Medicare Other | Admitting: Oncology

## 2012-01-18 DIAGNOSIS — E785 Hyperlipidemia, unspecified: Secondary | ICD-10-CM

## 2012-01-18 DIAGNOSIS — D708 Other neutropenia: Secondary | ICD-10-CM

## 2012-01-18 DIAGNOSIS — Z85048 Personal history of other malignant neoplasm of rectum, rectosigmoid junction, and anus: Secondary | ICD-10-CM

## 2012-01-18 NOTE — Progress Notes (Signed)
This office note has been dictated.  #[id number]

## 2012-01-19 ENCOUNTER — Telehealth: Payer: Self-pay | Admitting: Oncology

## 2012-01-19 ENCOUNTER — Telehealth: Payer: Self-pay | Admitting: Medical Oncology

## 2012-01-19 NOTE — Telephone Encounter (Signed)
Shirley-wife called asking what days will pt have his cyclosporin levels drawn. I called her back to let her know that 3/05 and 4/01. She voiced understanding. Pt has to hold his morning dose these mornings.

## 2012-01-19 NOTE — Telephone Encounter (Signed)
spoke with pts wife and advised her of all appts  aom

## 2012-01-19 NOTE — Progress Notes (Signed)
CC:   [name]. [name], M.D. [name]. [name] Motto, MD, [name], FACP, FAGA [name] Fee, MD [name], M.D. [name] Come, MD [name] Everts, DO  PROBLEM LIST:   1. 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 The patient  had bone marrow on 04/03/2009 and again on 04/08/2010. FISH  studies were negative as was the bone marrow. Currently, the  patient is on Neupogen 480 mcg subcu daily and cyclosporin 100 mg  b.i.d. His white count and ANC have been normal or increased  the past couple of months. 2. 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 The tumor was located in the proximal rectum first  detected on colonoscopy on 09/03/2010. Laparoscopic-assisted low  anterior resection with a diverting loop ileostomy was carried out  on 12/23/2010. Closure of the loop ileostomy occurred on  04/06/2011. The patient is without evidence of recurrence.  3. Renal insufficiency.  4. Hyperuricemia.  5. Anemia possibly related to renal insufficiency.  6. Possible seizures dating back to December 2009.  7. History of C difficile diarrhea.  8. Diabetes mellitus.  9. Hypertension.  10.Dyslipidemia.  11.BPH.  12.History of skin cancers.  13.Status post left total hip replacement.  14.Coronary artery disease. 15.History of recurrent febrile neutropenia with negative cultures requiring hospital admissions.   MEDICATIONS: 1. Allopurinol 200 mg twice a day. 2. Aspirin 81 mg daily. 3. Vitamin D3 5000 units daily. 4. Cyclosporine 100 mg twice a day. 5. Cardura 2 mg at bedtime. 6. Proscar 5 mg daily. 7. Fish oil omega-3 fatty acids 2 g daily. 8. Hydrochlorothiazide 25 mg every other day. 9. Humulin R 100 units/mL sliding scale. 10.Lisinopril 10 mg daily. 11.Toprol-XL 12.5 mg daily. 12.Neupogen 480 mcg subcutaneously  daily. 13.Percocet 5/325 mg as needed. 14.Pravachol 20 mg daily. 15.Topamax 50 mg in the morning and 200 mg at night.  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  The patient is accompanied by his wife [name] who does most of the talking and has as usual many questions to ask.  The patient is somewhat passive.  He was last seen by [place] on 11/23/2011. There have been no significant changes in his condition.  He denies any fever, chills, night sweats, any symptoms to suggest infection.  He has not had any seizure-like episodes.  His wife states that his energy is poor, and there are times when he sleeps a lot.  He has not been falling.  As stated, there really are no major changes in his condition. He did undergo a colonoscopy by Dr. Sheryn [name] that was negative. This took place on 12/22/2011.  Follow-up colonoscopy was suggested in 3 years.  PHYSICAL EXAMINATION:  General:  There are no obvious changes.  Vital Signs:  Weight is 296 pounds 14.4 ounces as compared with 191 pounds on 01/10/2012 and 198 pounds from 11/23/2011.  Height 6 feet 3 inches. Body surface area 2.17 sq m.  Blood pressure 135/57.  Other vital signs are normal.  HEENT:  There is no scleral icterus.  Mouth and pharynx are benign.  No peripheral adenopathy palpable.  Cardiac Exam:  Regular rhythm without murmur or rub.  Lungs are clear, but breath sounds are decreased.  Abdomen is benign, nontender with no organomegaly or masses palpable.  Extremities:  There is trace edema in the lower legs, left greater than right.  Neurologic:  Exam is grossly normal.  LABORATORY DATA:  From 01/14/2012, white count 29.0, ANC 26.8, hemoglobin 11.7, hematocrit 35.5, platelets 181,000.  Chemistries from 01/14/2012 notable for a BUN of 28, creatinine 1.37, and a glucose of 211.  Uric acid was 6.7.  On 12/21/2011,  the white count was 36.3.  We have been checking CBCs every 2 weeks.  On 12/09/2011, white count was 2.4 with an ANC of 1.2. On 12/03/2011, white count was 3.1 with an ANC of 2.1.  Platelet count was 124,000.  CEA on 01/14/2012 was 1.3.  Urinalysis on 12/03/2011 was normal.  Cyclosporine level on 10/13/2011 was 144, on 10/18/2011, 125, on 11/09/2011, 104, on 11/23/2011, 67, on 12/21/2011, 280, and on 01/14/2012, 97.  On 10/29/2011, ferritin was 232, iron saturation 36%.  IMAGING STUDIES: 1. CT scan of the abdomen and pelvis with IV contrast on 04/29/2011     showed no acute findings within the abdomen or pelvis.  There was     some atelectasis at the lung bases. 2. CT angiogram of the chest with IV contrast on 05/01/2011 showed no     evidence of pulmonary embolism.  There were moderate bilateral     pleural effusions, left greater than right.  There was a left lower     lobe opacity, likely atelectasis with some right basilar     atelectasis. 3. Chest x-ray, 2 view, from 12/03/2011 showed small, left greater     than right pleural effusions with associated opacity:  atelectasis     versus infiltrate.  IMPRESSION AND PLAN:  Clinically, [name] seems to be getting along quite well.  As stated above, he had a colonoscopy carried out by Dr. Sheryn [name] on 12/22/2011 that was negative.  He continues on cyclosporine 100 mg twice a day and Neupogen 480 mcg subcutaneously daily.  He gets this at home administered by his wife [name].  We had been checking CBCs every 2 weeks and cyclosporine levels every 4 weeks.  The latest cyclosporine level from 02/01 came back 97 with the normal range in our lab 140-330.  These values have been the a.m. trough levels before cyclosporine is administered that morning.  Values have fluctuated somewhat.  [name] states that they have been very compliant with the cyclosporine.  Certainly, if there were consistently low values we could try increasing  the cyclosporine.  What I would like to do is try to decrease the Neupogen dose.  We have tried every other day, and that did not work out well.  The patient developed febrile neutropenia when we last tried this several months ago.  Instead, I would like to try decreasing the dose down to 300 mcg subcutaneously daily, and perhaps at smaller doses on a daily basis. For the past several weeks, the patient's white count and ANC have been increased.  [name] tells me that the cost of both medicines has increased fairly dramatically.  I told her I would be willing to talk with her insurance company.  [name] tells me that she has about another 3-4 weeks of Neupogen at 480 mcg per dose.  When she gets close to running out, we will obtain for her 300 mcg per dose and try that for a while.  For now, we are checking CBCs every 2 weeks, cyclosporine levels every 4 weeks.  We will plan to see Mr. Laidlaw again in 2 months at  which time we will check CBC, chemistries and a uric acid level.  We will also be getting a cyclosporine level in 2 months.    ______________________________ Samul Dada, M.D. DSM/MEDQ  D:  01/18/2012  T:  01/19/2012  Job:  [id number]

## 2012-01-26 ENCOUNTER — Other Ambulatory Visit: Payer: Self-pay

## 2012-01-26 DIAGNOSIS — G40909 Epilepsy, unspecified, not intractable, without status epilepticus: Secondary | ICD-10-CM

## 2012-01-26 MED ORDER — TOPIRAMATE 50 MG PO TABS: ORAL_TABLET | ORAL | Status: DC

## 2012-02-01 ENCOUNTER — Encounter: Payer: Self-pay | Admitting: Medical Oncology

## 2012-02-01 ENCOUNTER — Other Ambulatory Visit (HOSPITAL_BASED_OUTPATIENT_CLINIC_OR_DEPARTMENT_OTHER): Payer: Medicare Other

## 2012-02-01 DIAGNOSIS — D708 Other neutropenia: Secondary | ICD-10-CM

## 2012-02-01 LAB — CBC WITH DIFFERENTIAL/PLATELET
Basophils Absolute: 0 10*3/uL (ref 0.0–0.1)
Eosinophils Absolute: 0.1 10*3/uL (ref 0.0–0.5)
HCT: 35.8 % — ABNORMAL LOW (ref 38.4–49.9)
HGB: 11.7 g/dL — ABNORMAL LOW (ref 13.0–17.1)
LYMPH%: 6.1 % — ABNORMAL LOW (ref 14.0–49.0)
MONO#: 0.3 10*3/uL (ref 0.1–0.9)
NEUT#: 28.7 10*3/uL — ABNORMAL HIGH (ref 1.5–6.5)
NEUT%: 92.5 % — ABNORMAL HIGH (ref 39.0–75.0)
Platelets: 151 10*3/uL (ref 140–400)
WBC: 31.1 10*3/uL — ABNORMAL HIGH (ref 4.0–10.3)

## 2012-02-01 NOTE — Progress Notes (Signed)
Dr. Mindi Curling called asking if it is ok to extract a tooth tomorrow. Per Dr. Arline Asp yes. His WBC and platlets were checked today.

## 2012-02-07 ENCOUNTER — Other Ambulatory Visit: Payer: Self-pay

## 2012-02-07 ENCOUNTER — Telehealth: Payer: Self-pay

## 2012-02-07 ENCOUNTER — Other Ambulatory Visit: Payer: Self-pay | Admitting: Family Medicine

## 2012-02-07 DIAGNOSIS — D708 Other neutropenia: Secondary | ICD-10-CM

## 2012-02-07 DIAGNOSIS — C187 Malignant neoplasm of sigmoid colon: Secondary | ICD-10-CM

## 2012-02-07 NOTE — Telephone Encounter (Signed)
At 1134 shirley called asking about cost of neupogen. Also she stated that pt had a tooth extraction on Wed and bled W,TH, and F and had to go back to dentist all 3 days. After investigation I called shirley about neupogen costs, she is in the "donut hole" with her insurance company and she understands this. Dr Arline Asp stated to get a CBC and I transferred phone call to scheduler so she could make lab appt for tomorrow for pt.

## 2012-02-07 NOTE — Telephone Encounter (Signed)
Call transfer from [facility] pt for lab for 2/26  aom

## 2012-02-08 ENCOUNTER — Telehealth: Payer: Self-pay

## 2012-02-08 ENCOUNTER — Other Ambulatory Visit (HOSPITAL_BASED_OUTPATIENT_CLINIC_OR_DEPARTMENT_OTHER): Payer: Medicare Other | Admitting: Lab

## 2012-02-08 DIAGNOSIS — C187 Malignant neoplasm of sigmoid colon: Secondary | ICD-10-CM

## 2012-02-08 LAB — CBC WITH DIFFERENTIAL/PLATELET
Basophils Absolute: 0 10*3/uL (ref 0.0–0.1)
EOS%: 0.1 % (ref 0.0–7.0)
Eosinophils Absolute: 0.1 10*3/uL (ref 0.0–0.5)
HCT: 34.7 % — ABNORMAL LOW (ref 38.4–49.9)
HGB: 11.3 g/dL — ABNORMAL LOW (ref 13.0–17.1)
MCH: 31.1 pg (ref 27.2–33.4)
MCV: 95.3 fL (ref 79.3–98.0)
MONO%: 0.4 % (ref 0.0–14.0)
NEUT#: 56 10*3/uL — ABNORMAL HIGH (ref 1.5–6.5)
NEUT%: 93.9 % — ABNORMAL HIGH (ref 39.0–75.0)

## 2012-02-08 NOTE — Telephone Encounter (Signed)
Made sure pt got a copy of labs, Hgb OK, WBC elevated and that is why neupogen is being decreased.[name] voiced understanding

## 2012-02-12 ENCOUNTER — Other Ambulatory Visit: Payer: Self-pay | Admitting: Physician Assistant

## 2012-02-12 ENCOUNTER — Other Ambulatory Visit: Payer: Self-pay | Admitting: Emergency Medicine

## 2012-02-15 ENCOUNTER — Other Ambulatory Visit: Payer: Medicare Other

## 2012-02-17 ENCOUNTER — Telehealth: Payer: Self-pay

## 2012-02-17 NOTE — Telephone Encounter (Signed)
[place] [employer] is asking for confirmation of receipt of fax on patient for medicine review  Claim number # [id number]  [employer]

## 2012-02-23 ENCOUNTER — Other Ambulatory Visit: Payer: Self-pay | Admitting: Physician Assistant

## 2012-02-28 ENCOUNTER — Other Ambulatory Visit: Payer: Self-pay | Admitting: Medical Oncology

## 2012-02-29 ENCOUNTER — Telehealth: Payer: Self-pay

## 2012-02-29 ENCOUNTER — Other Ambulatory Visit (HOSPITAL_BASED_OUTPATIENT_CLINIC_OR_DEPARTMENT_OTHER): Payer: Medicare Other

## 2012-02-29 DIAGNOSIS — D708 Other neutropenia: Secondary | ICD-10-CM

## 2012-02-29 DIAGNOSIS — D649 Anemia, unspecified: Secondary | ICD-10-CM

## 2012-02-29 LAB — CBC WITH DIFFERENTIAL/PLATELET
Basophils Absolute: 0 10*3/uL (ref 0.0–0.1)
EOS%: 0.6 % (ref 0.0–7.0)
Eosinophils Absolute: 0.1 10*3/uL (ref 0.0–0.5)
HGB: 10.4 g/dL — ABNORMAL LOW (ref 13.0–17.1)
LYMPH%: 10.5 % — ABNORMAL LOW (ref 14.0–49.0)
MCH: 30.6 pg (ref 27.2–33.4)
MCV: 94.1 fL (ref 79.3–98.0)
MONO%: 2.4 % (ref 0.0–14.0)
Platelets: 185 10*3/uL (ref 140–400)
RBC: 3.4 10*6/uL — ABNORMAL LOW (ref 4.20–5.82)
RDW: 15.1 % — ABNORMAL HIGH (ref 11.0–14.6)

## 2012-02-29 LAB — CYCLOSPORINE LEVEL: Cyclosporine, Blood: 68 ng/mL — ABNORMAL LOW (ref 140–330)

## 2012-02-29 NOTE — Telephone Encounter (Signed)
sw shirley that DSM is looking at possibly changing neupogen and cyclosporin but DSM is on call at present and we will call her tomorrow with the changes.

## 2012-03-06 ENCOUNTER — Other Ambulatory Visit: Payer: Self-pay | Admitting: Medical Oncology

## 2012-03-06 ENCOUNTER — Telehealth: Payer: Self-pay | Admitting: Medical Oncology

## 2012-03-06 DIAGNOSIS — Z85048 Personal history of other malignant neoplasm of rectum, rectosigmoid junction, and anus: Secondary | ICD-10-CM

## 2012-03-06 DIAGNOSIS — C187 Malignant neoplasm of sigmoid colon: Secondary | ICD-10-CM

## 2012-03-06 DIAGNOSIS — D708 Other neutropenia: Secondary | ICD-10-CM

## 2012-03-06 DIAGNOSIS — R197 Diarrhea, unspecified: Secondary | ICD-10-CM

## 2012-03-06 NOTE — Telephone Encounter (Signed)
I spoke with Shirley-wife about cyclosporine level. Per Dr. Arline Asp his last level was 68. He would like for pt to increase the dose from 100 mg BID to 125 mg BID. She then asked if cyclosporine can cause diarrhea. She states the pt has had terrible diarrhea for the past few weeks. He has a history of C-Diff from a previous hospitalization. I discussed with Dr. Arline Asp and he would like for pt to hold the cyclosporine and get a stool sample. I will leave the cup at the front desk and she can pick it up for pt. She voiced understanding.

## 2012-03-10 ENCOUNTER — Telehealth: Payer: Self-pay | Admitting: Medical Oncology

## 2012-03-10 ENCOUNTER — Other Ambulatory Visit (HOSPITAL_COMMUNITY): Payer: Self-pay | Admitting: Oncology

## 2012-03-10 DIAGNOSIS — D708 Other neutropenia: Secondary | ICD-10-CM

## 2012-03-10 NOTE — Telephone Encounter (Signed)
I called pt and spoke with wife [name] to see how his diarrhea is. She states that since she got the collection cup he had not had a BM. Tues night he had terrible seizures and since then he has not had a BM. He did not stop the cyclosporine. I told her that Dr. Arline Asp would like for him to increase his cyclosporine to 125 mg BID. That will be (5) 25 mg capsules BID. She also asked about getting the vials of neupogen from the [facility]. I told her Dr. Arline Asp will discuss with them at his visit Monday. She voiced understanding. They will bring in stool specimen Monday is possible. Dr. Arline Asp notified.

## 2012-03-13 ENCOUNTER — Encounter: Payer: Self-pay | Admitting: Oncology

## 2012-03-13 ENCOUNTER — Telehealth: Payer: Self-pay | Admitting: Oncology

## 2012-03-13 ENCOUNTER — Ambulatory Visit (HOSPITAL_BASED_OUTPATIENT_CLINIC_OR_DEPARTMENT_OTHER): Payer: Medicare Other | Admitting: Oncology

## 2012-03-13 ENCOUNTER — Other Ambulatory Visit: Payer: Medicare Other

## 2012-03-13 ENCOUNTER — Telehealth: Payer: Self-pay | Admitting: Medical Oncology

## 2012-03-13 VITALS — BP 112/56 | HR 75 | Temp 97.6°F | Ht 75.0 in | Wt 190.7 lb

## 2012-03-13 DIAGNOSIS — D708 Other neutropenia: Secondary | ICD-10-CM

## 2012-03-13 DIAGNOSIS — C187 Malignant neoplasm of sigmoid colon: Secondary | ICD-10-CM

## 2012-03-13 LAB — CBC WITH DIFFERENTIAL/PLATELET
BASO%: 0.1 % (ref 0.0–2.0)
EOS%: 0.2 % (ref 0.0–7.0)
HGB: 11.3 g/dL — ABNORMAL LOW (ref 13.0–17.1)
MCH: 31 pg (ref 27.2–33.4)
MCHC: 32.4 g/dL (ref 32.0–36.0)
MCV: 95.8 fL (ref 79.3–98.0)
MONO%: 1 % (ref 0.0–14.0)
RBC: 3.66 10*6/uL — ABNORMAL LOW (ref 4.20–5.82)
RDW: 15.3 % — ABNORMAL HIGH (ref 11.0–14.6)
lymph#: 3 10*3/uL (ref 0.9–3.3)

## 2012-03-13 NOTE — Telephone Encounter (Signed)
gv pt appt for [id number]

## 2012-03-13 NOTE — Progress Notes (Signed)
This office note has been dictated.  #[id number]

## 2012-03-13 NOTE — Telephone Encounter (Signed)
I called [name] to let her know that our office is working on prior authorization for cyclosporine and neupogen. Once they are approved she will be able to pick up medications. She voiced understanding.

## 2012-03-13 NOTE — Progress Notes (Signed)
CC:   [name]. [name], M.D. [name]. [name] Motto, MD, [employer], FACP, FAGA [name] Fee, MD [name], M.D. [name] Come, MD [name] Everts, DO   PROBLEM LIST:  1. 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 The patient  had bone marrow on 04/03/2009 and again on 04/08/2010. FISH  studies were negative as was the bone marrow.  2. 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 The tumor was located in the proximal rectum first  detected on colonoscopy on 09/03/2010. Laparoscopic-assisted low  anterior resection with a diverting loop ileostomy was carried out  on 12/23/2010. Closure of the loop ileostomy occurred on  04/06/2011. The patient is without evidence of recurrence. Colonoscopy carried out by Dr. Sheryn Bison 12/22/2011 was     without significant findings.  3. Renal insufficiency.  4. Hyperuricemia.  5. Anemia possibly related to renal insufficiency.  6. Possible seizures dating back to December 2009.  7. History of C difficile diarrhea.  8. Diabetes mellitus.  9. Hypertension.  10.Dyslipidemia.  11.BPH.  12.History of skin cancers.  13.Status post left total hip replacement.  14.Coronary artery disease.  15.History of recurrent febrile neutropenia with negative cultures  requiring hospital admissions.  MEDICATIONS:  1. Allopurinol 200 mg twice a day.  2. Aspirin 81 mg daily.  3. Vitamin D3 5000 units daily.  4. Cyclosporine 125 mg twice a day.  Dosage was increased from 100 mg twice a day to 125 mg twice a day on 03/10/2012. 5. Cardura 2 mg at bedtime.  6. Proscar 5 mg daily.  7. Fish oil omega-3 fatty acids 2 g daily.  8. Hydrochlorothiazide 25 mg every other day.  9. Humulin R 100 units/mL sliding scale.  10.Lisinopril 10 mg daily.  11.Toprol-XL 12.5 mg daily.  12.Neupogen 300 mcg  subcu daily. 13.Percocet 5/325 mg as needed.  14.Pravachol 20 mg daily.  15.Topamax 50 mg in the morning and 200 mg at night.    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 The patient was accompanied by his wife [name].  [name] was last seen by [place] on 01/18/2012.  Since that visit he has been taking Neupogen 300 mcg subcu daily.  His wife administers this.  He had been taking cyclosporine 100 mg twice a day, however because of low cyclosporine levels we increased his cyclosporine to 125 mg twice a day.  The dosage was increased on 03/29.  The patient has not had any infections or documented fever.  His main problem has been intermittent diarrhea.  We were going to check his stools for C diff, however he became constipated and we have not been able to obtain a stool specimen.  He does have a past history of diarrhea secondary to C difficile.  In addition, the patient had 3 apparent seizures 1 night.  Patient's wife states that the patient fell out of bed and may have cracked some ribs.  He does have some pain in his left chest and tenderness.  These episodes always occur when the patient is sleeping.  He has some abnormal movements, may have some incontinence of urine.  He wears Pull-Ups at night.  The patient's wife [name] states that whenever she sees bruises on the patient, she knows that he is going to have seizures.  He  has not had any seizures since then.  She says the patient is weak and in pain.  The patient denies any complaints.  Says he feels fine and denies any pain.  PHYSICAL EXAM:  [name] shows little change.  Weight is 190 pounds compared to 196.6 pounds 2 months ago.  Patient's weight is variable and fluctuates.  Height 6 feet 3 inches, body surface area 2.14 sq/m.  Blood pressure 112/56.  Other vital signs are normal.  Temperature is 97.6. There is no scleral  icterus.  Mouth and pharynx are benign.  No oral trauma.  There is no peripheral adenopathy palpable.  Heart/lungs: Normal.  Abdomen:  Benign.  The patient does have some tenderness over the left mid lateral ribs, but there is no ecchymosis.  I did not palpate very aggressively.  Extremities:  There is 2+ edema of the left ankle, 1+ of the right ankle.  Neurologic:  Grossly normal.  LABORATORY DATA:  Today white count 45.9, ANC 42.3, hemoglobin 11.3, hematocrit 35.0, platelets 228,000.  White count has been consistently elevated even though we decreased the Neupogen from 480 mcg a day to 300 mcg a day.  We are going to make further dose reduction.  Chemistries, uric acid, and cyclosporine level are currently pending.  Chemistries from 01/14/2012 notable for BUN of 28, creatinine 1.37.  The patient does have history of renal insufficiency.  Alkaline phosphatase 217, albumin 4.2, uric acid 6.7, LDH 129.  CEA was 1.3.  Cyclosporine level on 01/14/2012 drawn in the morning before the patient took a dose was 97.  Our normal range was 140-330 ng per mL.  On 03/19 the cyclosporine level was 68.  If we go back to December 21, 2011 the cyclosporine level was 280 which is in the therapeutic range.   IMAGING STUDIES:  1. CT scan of the abdomen and pelvis with IV contrast on 04/29/2011  showed no acute findings within the abdomen or pelvis. There was  some atelectasis at the lung bases.  2. CT angiogram of the chest with IV contrast on 05/01/2011 showed no  evidence of pulmonary embolism. There were moderate bilateral  pleural effusions, left greater than right. There was a left lower  lobe opacity, likely atelectasis with some right basilar  atelectasis.  3. Chest x-ray, 2 view, from 12/03/2011 showed small, left greater  than right pleural effusions with associated opacity: atelectasis  versus infiltrate.   IMPRESSION AND PLAN:  From our standpoint, [name] seems to be doing okay.  His  white count has remained elevated secondary to Neupogen.  He has not had any recent infections or fever.  We will lower the Neupogen dose which has been 300 mcg daily for the past month or so, perhaps even 2 months down to 300 mcg 4 times a week i.e. Tuesday, Thursday, Saturday and Sunday.  The Neupogen does not [name] in a Uni dose vial that is less than 300 mcg.  There is a multi dose vial, but we have elected not to make things too complicated for the patient and his wife.  As stated above, the cyclosporine dose was increased just a few days ago to 125 mg twice a day.  We will continue to check CBCs every 2 weeks.  This was loaded in the smart sets.  We will check cyclosporine level every 4 weeks.  The patient, and particularly his wife know to hold the morning dose.  He usually takes his cyclosporine around 6:00 p.m. in the  evening.  We will plan to see Mr. Ailey again in about 10 weeks which will be June 10th at which time we will check CBC, chemistries, uric acid and LDH.  Mr. Paternoster has not had any recent followups regarding his seizure.  I spoke with his wife and strongly urged her to make an appointment with either Dr. Cloretta Ned or whoever it is that supposed to be following the patient's seizures.  He may need further evaluation, an increase in the dose of Topamax or perhaps the addition of other agents or substitution of other agents for the Topamax.  I tried to emphasize this to the patient and particularly the patient's wife.  The patient does have apparently some intermittent diarrhea.  I am not sure what to make of this.  In the past he has had C difficile, but apparently he has not had a bowel movement in a couple of days, which makes this diagnosis unlikely.  As stated, followup is as described above.    ______________________________ Samul Dada, M.D. DSM/MEDQ  D:  03/13/2012  T:  03/13/2012  Job:  [id number]

## 2012-03-13 NOTE — Progress Notes (Signed)
Called [facility], [id number], for cyclosporine and neupogen prior auths; went to review should receive answers within 72 hours.

## 2012-03-13 NOTE — Progress Notes (Signed)
Cyclosphrine 25mg  has been approved from 03/13/12-03/13/13 and neupogen has been approved 03/13/12-03/13/13.

## 2012-03-14 ENCOUNTER — Telehealth: Payer: Self-pay

## 2012-03-14 LAB — COMPREHENSIVE METABOLIC PANEL
AST: 29 U/L (ref 0–37)
Albumin: 4 g/dL (ref 3.5–5.2)
Alkaline Phosphatase: 310 U/L — ABNORMAL HIGH (ref 39–117)
Potassium: 4.7 mEq/L (ref 3.5–5.3)
Sodium: 138 mEq/L (ref 135–145)
Total Bilirubin: 0.5 mg/dL (ref 0.3–1.2)
Total Protein: 6.9 g/dL (ref 6.0–8.3)

## 2012-03-14 LAB — URIC ACID: Uric Acid, Serum: 6.5 mg/dL (ref 4.0–7.8)

## 2012-03-14 LAB — CYCLOSPORINE LEVEL: Cyclosporine, Blood: 99 ng/mL — ABNORMAL LOW (ref 140–330)

## 2012-03-14 NOTE — Telephone Encounter (Signed)
S/w wife that cyclosporin level is 99 and there is not change in cyclosporin dosage. He is to take 125mg  BID. She voiced understanding.

## 2012-03-18 ENCOUNTER — Other Ambulatory Visit: Payer: Self-pay | Admitting: Physician Assistant

## 2012-03-18 ENCOUNTER — Other Ambulatory Visit: Payer: Self-pay | Admitting: Emergency Medicine

## 2012-03-27 ENCOUNTER — Other Ambulatory Visit (HOSPITAL_BASED_OUTPATIENT_CLINIC_OR_DEPARTMENT_OTHER): Payer: Medicare Other | Admitting: Lab

## 2012-03-27 DIAGNOSIS — D708 Other neutropenia: Secondary | ICD-10-CM

## 2012-03-27 LAB — CBC WITH DIFFERENTIAL/PLATELET
BASO%: 1.4 % (ref 0.0–2.0)
EOS%: 1.3 % (ref 0.0–7.0)
LYMPH%: 39.1 % (ref 14.0–49.0)
MCH: 31.6 pg (ref 27.2–33.4)
MCHC: 33 g/dL (ref 32.0–36.0)
MCV: 95.9 fL (ref 79.3–98.0)
MONO#: 0.4 10*3/uL (ref 0.1–0.9)
MONO%: 7.1 % (ref 0.0–14.0)
NEUT%: 51.1 % (ref 39.0–75.0)
Platelets: 198 10*3/uL (ref 140–400)
RBC: 3.54 10*6/uL — ABNORMAL LOW (ref 4.20–5.82)
WBC: 5.5 10*3/uL (ref 4.0–10.3)
nRBC: 0 % (ref 0–0)

## 2012-03-29 ENCOUNTER — Other Ambulatory Visit: Payer: Self-pay | Admitting: Physician Assistant

## 2012-04-10 ENCOUNTER — Other Ambulatory Visit: Payer: Medicare Other | Admitting: Lab

## 2012-04-12 ENCOUNTER — Other Ambulatory Visit (HOSPITAL_BASED_OUTPATIENT_CLINIC_OR_DEPARTMENT_OTHER): Payer: Medicare Other | Admitting: Lab

## 2012-04-12 DIAGNOSIS — C187 Malignant neoplasm of sigmoid colon: Secondary | ICD-10-CM

## 2012-04-12 DIAGNOSIS — D708 Other neutropenia: Secondary | ICD-10-CM

## 2012-04-12 LAB — CBC WITH DIFFERENTIAL/PLATELET
BASO%: 0.3 % (ref 0.0–2.0)
EOS%: 0.3 % (ref 0.0–7.0)
MCH: 30.5 pg (ref 27.2–33.4)
MCHC: 31.9 g/dL — ABNORMAL LOW (ref 32.0–36.0)
MCV: 95.6 fL (ref 79.3–98.0)
MONO%: 1.3 % (ref 0.0–14.0)
RBC: 3.65 10*6/uL — ABNORMAL LOW (ref 4.20–5.82)
RDW: 13.4 % (ref 11.0–14.6)
lymph#: 3.2 10*3/uL (ref 0.9–3.3)

## 2012-04-13 ENCOUNTER — Telehealth: Payer: Self-pay

## 2012-04-13 ENCOUNTER — Encounter: Payer: Self-pay | Admitting: Oncology

## 2012-04-13 LAB — CYCLOSPORINE LEVEL: Cyclosporine, Blood: 96 ng/mL — ABNORMAL LOW (ref 140–330)

## 2012-04-13 NOTE — Progress Notes (Signed)
CBC on 04/12/2012 was as follows: White count 33.8,  ANC 30.0, hemoglobin 11.1, hematocrit 34.9, platelets 193,000.  Patient is on Neupogen 300 mcg daily 4 days per week. We will decrease the dose to 3 days a week, specifically Tuesday Thursday and Saturday.  Cyclosporin dose is 125 mg  twice a day.

## 2012-04-13 NOTE — Telephone Encounter (Signed)
S/w wife that cyclosporin level was 96 and DSM made no changes on his dosage. She expressed understanding.

## 2012-04-13 NOTE — Telephone Encounter (Signed)
S/w wife: confirmed pt is taking neupogen T,Th,S,S and told her to decrease done to 3x/w T,Th,Sat per DSM. Confirmed pt is taking 125 mg cyclosporin BID. Stated I will call back if we need to adjust the cyclosporin dose.

## 2012-04-18 ENCOUNTER — Telehealth: Payer: Self-pay

## 2012-04-18 NOTE — Telephone Encounter (Signed)
Wife called earlier asking if CoQ10 would be OK to take. Conferred w/pharmacist and only interaction was with BP meds. S/w [name] and told her that if they monitor pt's BP it should be OK to take CoQ10

## 2012-04-20 ENCOUNTER — Other Ambulatory Visit: Payer: Self-pay | Admitting: Emergency Medicine

## 2012-04-24 ENCOUNTER — Telehealth: Payer: Self-pay | Admitting: Oncology

## 2012-04-24 ENCOUNTER — Other Ambulatory Visit: Payer: Medicare Other

## 2012-04-24 NOTE — Telephone Encounter (Signed)
needs to r.s 5/13 lab to 5/14 as wife has dia   aom

## 2012-04-25 ENCOUNTER — Other Ambulatory Visit: Payer: Self-pay | Admitting: Oncology

## 2012-04-25 ENCOUNTER — Other Ambulatory Visit: Payer: Self-pay | Admitting: Medical Oncology

## 2012-04-25 ENCOUNTER — Other Ambulatory Visit (HOSPITAL_BASED_OUTPATIENT_CLINIC_OR_DEPARTMENT_OTHER): Payer: Medicare Other | Admitting: Lab

## 2012-04-25 ENCOUNTER — Telehealth: Payer: Self-pay | Admitting: Medical Oncology

## 2012-04-25 DIAGNOSIS — D72819 Decreased white blood cell count, unspecified: Secondary | ICD-10-CM

## 2012-04-25 DIAGNOSIS — D708 Other neutropenia: Secondary | ICD-10-CM

## 2012-04-25 LAB — CBC WITH DIFFERENTIAL/PLATELET
BASO%: 1.7 % (ref 0.0–2.0)
Basophils Absolute: 0 10*3/uL (ref 0.0–0.1)
HCT: 32.1 % — ABNORMAL LOW (ref 38.4–49.9)
HGB: 10.9 g/dL — ABNORMAL LOW (ref 13.0–17.1)
MONO#: 0.3 10*3/uL (ref 0.1–0.9)
NEUT#: 0 10*3/uL — CL (ref 1.5–6.5)
NEUT%: 3.5 % — ABNORMAL LOW (ref 39.0–75.0)
WBC: 1.2 10*3/uL — ABNORMAL LOW (ref 4.0–10.3)
lymph#: 0.8 10*3/uL — ABNORMAL LOW (ref 0.9–3.3)

## 2012-04-25 NOTE — Telephone Encounter (Signed)
s/w wife and aware of 5/17 lab

## 2012-04-25 NOTE — Telephone Encounter (Signed)
I spoke with Shirley-wife regarding pt's WBC and ANC. Dr. Arline Asp is going to check his labs on Friday.  I stressed the importance of fever or signs of infection to go to ER. She voiced understanding. POF to schedulers.

## 2012-04-28 ENCOUNTER — Encounter: Payer: Self-pay | Admitting: Oncology

## 2012-04-28 ENCOUNTER — Other Ambulatory Visit (HOSPITAL_BASED_OUTPATIENT_CLINIC_OR_DEPARTMENT_OTHER): Payer: Medicare Other | Admitting: Lab

## 2012-04-28 ENCOUNTER — Other Ambulatory Visit: Payer: Self-pay

## 2012-04-28 ENCOUNTER — Telehealth: Payer: Self-pay

## 2012-04-28 DIAGNOSIS — D72819 Decreased white blood cell count, unspecified: Secondary | ICD-10-CM

## 2012-04-28 LAB — CBC WITH DIFFERENTIAL/PLATELET
Basophils Absolute: 0 10*3/uL (ref 0.0–0.1)
EOS%: 2.3 % (ref 0.0–7.0)
HCT: 31.9 % — ABNORMAL LOW (ref 38.4–49.9)
HGB: 10.6 g/dL — ABNORMAL LOW (ref 13.0–17.1)
MCH: 31.3 pg (ref 27.2–33.4)
MCV: 94.2 fL (ref 79.3–98.0)
NEUT%: 4.4 % — ABNORMAL LOW (ref 39.0–75.0)
lymph#: 2 10*3/uL (ref 0.9–3.3)

## 2012-04-28 NOTE — Telephone Encounter (Signed)
I told [name] that DSM wants pt to increase his neupogen back up to daily from T,Th,Sat. And that DSM wants labs next Friday. She voiced understanding. Phone call forwarded to scheduler.

## 2012-04-28 NOTE — Progress Notes (Unsigned)
Today the ANC was 0.1. White count was 2.6, hemoglobin 10.6, hematocrit 31.9, and platelets 144,000. On 04/25/2012 white count was 1.2 with an ANC of 0.0. On 04/12/2012 white count was 33.8 with an ANC of 30.0.  It will be recalled that on May 2, we had reduced the Neupogen from 4 days a week to 3 days a week.  We will increase the Neupogen to daily administration, 300 mcg daily.

## 2012-05-01 ENCOUNTER — Telehealth: Payer: Self-pay | Admitting: Oncology

## 2012-05-01 NOTE — Telephone Encounter (Signed)
moved lab appt from 5/28 to 5/24,done,aware  aom

## 2012-05-02 ENCOUNTER — Other Ambulatory Visit (HOSPITAL_COMMUNITY): Payer: Self-pay | Admitting: Neurology

## 2012-05-02 ENCOUNTER — Other Ambulatory Visit: Payer: Self-pay | Admitting: Neurology

## 2012-05-02 DIAGNOSIS — R569 Unspecified convulsions: Secondary | ICD-10-CM

## 2012-05-02 DIAGNOSIS — R410 Disorientation, unspecified: Secondary | ICD-10-CM

## 2012-05-03 ENCOUNTER — Other Ambulatory Visit: Payer: Self-pay | Admitting: Physician Assistant

## 2012-05-05 ENCOUNTER — Other Ambulatory Visit (HOSPITAL_BASED_OUTPATIENT_CLINIC_OR_DEPARTMENT_OTHER): Payer: Medicare Other | Admitting: Lab

## 2012-05-05 ENCOUNTER — Telehealth: Payer: Self-pay | Admitting: Medical Oncology

## 2012-05-05 ENCOUNTER — Other Ambulatory Visit: Payer: Self-pay | Admitting: Medical Oncology

## 2012-05-05 ENCOUNTER — Telehealth: Payer: Self-pay | Admitting: Oncology

## 2012-05-05 DIAGNOSIS — D72819 Decreased white blood cell count, unspecified: Secondary | ICD-10-CM

## 2012-05-05 LAB — CBC WITH DIFFERENTIAL/PLATELET
Basophils Absolute: 0.1 10*3/uL (ref 0.0–0.1)
Eosinophils Absolute: 0.1 10*3/uL (ref 0.0–0.5)
HCT: 33.3 % — ABNORMAL LOW (ref 38.4–49.9)
LYMPH%: 11.2 % — ABNORMAL LOW (ref 14.0–49.0)
MCV: 91.7 fL (ref 79.3–98.0)
MONO%: 1.3 % (ref 0.0–14.0)
NEUT#: 30 10*3/uL — ABNORMAL HIGH (ref 1.5–6.5)
NEUT%: 87 % — ABNORMAL HIGH (ref 39.0–75.0)
Platelets: 209 10*3/uL (ref 140–400)
RBC: 3.63 10*6/uL — ABNORMAL LOW (ref 4.20–5.82)

## 2012-05-05 NOTE — Telephone Encounter (Signed)
I called pt's wife [name] to let her know that Dr. Arline Asp is not making any changes in his neupogen. He would like to check a CBC in 2 weeks and she is aware the schedulers will call with appointment.

## 2012-05-05 NOTE — Telephone Encounter (Signed)
S/w wife re appt for 6/7

## 2012-05-08 ENCOUNTER — Other Ambulatory Visit (HOSPITAL_COMMUNITY): Payer: Self-pay | Admitting: Oncology

## 2012-05-08 DIAGNOSIS — D708 Other neutropenia: Secondary | ICD-10-CM

## 2012-05-08 DIAGNOSIS — C187 Malignant neoplasm of sigmoid colon: Secondary | ICD-10-CM

## 2012-05-09 ENCOUNTER — Other Ambulatory Visit: Payer: Medicare Other | Admitting: Lab

## 2012-05-09 ENCOUNTER — Ambulatory Visit (HOSPITAL_COMMUNITY)
Admission: RE | Admit: 2012-05-09 | Discharge: 2012-05-09 | Disposition: A | Discharge status: Home / Self Care | Payer: Medicare Other | Source: Ambulatory Visit | Attending: Neurology | Admitting: Neurology

## 2012-05-09 DIAGNOSIS — R569 Unspecified convulsions: Secondary | ICD-10-CM | POA: Insufficient documentation

## 2012-05-09 DIAGNOSIS — Z1389 Encounter for screening for other disorder: Secondary | ICD-10-CM | POA: Insufficient documentation

## 2012-05-09 NOTE — Progress Notes (Signed)
OP routine EEG completed.

## 2012-05-11 ENCOUNTER — Ambulatory Visit
Admission: RE | Admit: 2012-05-11 | Discharge: 2012-05-11 | Disposition: A | Discharge status: Home / Self Care | Payer: Medicare Other | Source: Ambulatory Visit | Attending: Neurology | Admitting: Neurology

## 2012-05-11 DIAGNOSIS — R410 Disorientation, unspecified: Secondary | ICD-10-CM

## 2012-05-11 DIAGNOSIS — R569 Unspecified convulsions: Secondary | ICD-10-CM

## 2012-05-12 ENCOUNTER — Telehealth: Payer: Self-pay | Admitting: Oncology

## 2012-05-12 NOTE — Telephone Encounter (Signed)
aware of new appt time for 6/13

## 2012-05-13 NOTE — Procedures (Signed)
EEG NUMBER:  [id number].  This routine EEG was requested in this 78 year old man who has a history of seizures for 3 years.  Most recent seizure was approximately 4 weeks ago.   MEDICATIONS:  Topiramate and lamotrigine.  EEG was done with the patient awake and drowsy.  During periods of maximal wakefulness, he had a 7 cycle per 2nd posterior dominant rhythm that attenuated with eye opening and was symmetric.  It was moderately regulated and moderately sustained.  Background activities were composed of low-amplitude beta activities were frontally dominant and symmetric.  Photic stimulation produced symmetric driving response.  The patient was not hyperventilated.  The patient did become drowsy as characterized by an attenuation of muscle activity as well as the alpha rhythm.  During drowsiness, there was the onset of diffuse arrhythmic delta activity occurred in bursts.  CLINICAL INTERPRETATION:  This routine EEG done with the patient awake and drowsy is abnormal.  Background activities in the theta range suggest a mild encephalopathy of nonspecific etiology.          ______________________________ Denton Meek, MD    [id number] D:  05/13/2012 00:04:31  T:  05/13/2012 00:14:33  Job #:  [id number]

## 2012-05-19 ENCOUNTER — Other Ambulatory Visit (HOSPITAL_BASED_OUTPATIENT_CLINIC_OR_DEPARTMENT_OTHER): Payer: Medicare Other

## 2012-05-19 DIAGNOSIS — D72819 Decreased white blood cell count, unspecified: Secondary | ICD-10-CM

## 2012-05-19 LAB — CBC WITH DIFFERENTIAL/PLATELET
Basophils Absolute: 0.1 10*3/uL (ref 0.0–0.1)
Eosinophils Absolute: 0.1 10*3/uL (ref 0.0–0.5)
HCT: 34.4 % — ABNORMAL LOW (ref 38.4–49.9)
HGB: 11.1 g/dL — ABNORMAL LOW (ref 13.0–17.1)
LYMPH%: 7.8 % — ABNORMAL LOW (ref 14.0–49.0)
MCV: 95.2 fL (ref 79.3–98.0)
MONO#: 0.4 10*3/uL (ref 0.1–0.9)
MONO%: 1 % (ref 0.0–14.0)
NEUT#: 38 10*3/uL — ABNORMAL HIGH (ref 1.5–6.5)
NEUT%: 90.6 % — ABNORMAL HIGH (ref 39.0–75.0)
Platelets: 203 10*3/uL (ref 140–400)
WBC: 42 10*3/uL — ABNORMAL HIGH (ref 4.0–10.3)

## 2012-05-20 ENCOUNTER — Other Ambulatory Visit: Payer: Self-pay | Admitting: Physician Assistant

## 2012-05-25 ENCOUNTER — Ambulatory Visit (HOSPITAL_BASED_OUTPATIENT_CLINIC_OR_DEPARTMENT_OTHER): Payer: Medicare Other | Admitting: Oncology

## 2012-05-25 ENCOUNTER — Encounter: Payer: Self-pay | Admitting: Oncology

## 2012-05-25 ENCOUNTER — Other Ambulatory Visit (HOSPITAL_BASED_OUTPATIENT_CLINIC_OR_DEPARTMENT_OTHER): Payer: Medicare Other

## 2012-05-25 VITALS — BP 156/69 | HR 57 | Temp 97.0°F | Ht 75.0 in | Wt 194.0 lb

## 2012-05-25 DIAGNOSIS — D72819 Decreased white blood cell count, unspecified: Secondary | ICD-10-CM

## 2012-05-25 DIAGNOSIS — D708 Other neutropenia: Secondary | ICD-10-CM

## 2012-05-25 DIAGNOSIS — Z862 Personal history of diseases of the blood and blood-forming organs and certain disorders involving the immune mechanism: Secondary | ICD-10-CM

## 2012-05-25 DIAGNOSIS — Z85038 Personal history of other malignant neoplasm of large intestine: Secondary | ICD-10-CM

## 2012-05-25 DIAGNOSIS — C187 Malignant neoplasm of sigmoid colon: Secondary | ICD-10-CM

## 2012-05-25 LAB — CBC WITH DIFFERENTIAL/PLATELET
BASO%: 0.3 % (ref 0.0–2.0)
HCT: 34.6 % — ABNORMAL LOW (ref 38.4–49.9)
LYMPH%: 12.4 % — ABNORMAL LOW (ref 14.0–49.0)
MCH: 29.5 pg (ref 27.2–33.4)
MCHC: 31.3 g/dL — ABNORMAL LOW (ref 32.0–36.0)
MCV: 94.5 fL (ref 79.3–98.0)
MONO#: 0.4 10*3/uL (ref 0.1–0.9)
MONO%: 1.6 % (ref 0.0–14.0)
NEUT%: 85.1 % — ABNORMAL HIGH (ref 39.0–75.0)
Platelets: 191 10*3/uL (ref 140–400)
RBC: 3.67 10*6/uL — ABNORMAL LOW (ref 4.20–5.82)
WBC: 26.8 10*3/uL — ABNORMAL HIGH (ref 4.0–10.3)

## 2012-05-25 LAB — COMPREHENSIVE METABOLIC PANEL
ALT: 28 U/L (ref 0–53)
Alkaline Phosphatase: 282 U/L — ABNORMAL HIGH (ref 39–117)
CO2: 25 mEq/L (ref 19–32)
Creatinine, Ser: 1.3 mg/dL (ref 0.50–1.35)
Sodium: 138 mEq/L (ref 135–145)
Total Bilirubin: 0.2 mg/dL — ABNORMAL LOW (ref 0.3–1.2)

## 2012-05-25 LAB — URIC ACID: Uric Acid, Serum: 4.8 mg/dL (ref 4.0–7.8)

## 2012-05-25 LAB — LACTATE DEHYDROGENASE: LDH: 137 U/L (ref 94–250)

## 2012-05-25 NOTE — Progress Notes (Signed)
This office note has been dictated.    #[id number]

## 2012-05-26 NOTE — Progress Notes (Signed)
CC:   [name]. Cleta Alberts, M.D. Vania Rea. Jarold Motto, MD, [place], FACP, FAGA Rachael Fee, MD Clovis Pu Cornett, M.D. Simonne Come, MD Laren Everts, DO   PROBLEM LIST:  1. 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 The patient  had bone marrow on 04/03/2009 and again on 04/08/2010. FISH  studies were negative as was the bone marrow.  Neupogen was started in late December 2011.  Cyclosporine (Gengraf) was started on August 04, 2011.  2. 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 The tumor was located in the proximal rectum first  detected on colonoscopy on 09/03/2010. Laparoscopic-assisted low  anterior resection with a diverting loop ileostomy was carried out  on 12/23/2010. Closure of the loop ileostomy occurred on  04/06/2011. The patient is without evidence of recurrence.  Colonoscopy carried out by Dr. Sheryn Bison 12/22/2011 was  without significant findings.  3. Renal insufficiency.  4. Hyperuricemia.  5. Anemia possibly related to renal insufficiency.  6. Possible seizures dating back to December 2009.  7. History of C difficile diarrhea.  8. Diabetes mellitus.  9. Hypertension.  10.Dyslipidemia.  11.BPH.  12.History of skin cancers.  13.Status post left total hip replacement.  14.Coronary artery disease.  15.History of recurrent febrile neutropenia with negative cultures  requiring hospital admissions.  MEDICATIONS:  1. Allopurinol 200 mg twice a day.  2. Aspirin 81 mg daily.  3. Vitamin D3 5000 units daily.  4. Cyclosporine 125 mg twice a day. Dosage was increased  from 100 mg twice a day to 125 mg twice a day on 03/10/2012.  Cyclosporine was started  in August 2012. 5. Cardura 2 mg at bedtime.  6. Proscar 5 mg daily.  7. Fish oil omega-3 fatty acids 2 g daily.  8. Hydrochlorothiazide 25  mg every other day.  9. Humulin R 100 units/mL sliding scale.  10.Lisinopril 10 mg daily.  11.Toprol-XL 12.5 mg daily.  12.Neupogen 300 mcg subcu daily. Neupogen was started in late December 2011. 13.Percocet 5/325 mg as needed.  14.Pravachol 20 mg daily.  15.Topamax 50 mg in the morning and 200 mg at night. 16.Lamictal 50 mg nightly was initiated by Dr. Amelia Jo within the past few weeks.   HISTORY:  [name] was seen today for followup of his autoimmune neutropenia.  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  [name] was accompanied by his wife [name].  He was last seen by [place] on 03/13/2012.  I am happy to say that the patient's course over the past 10 weeks has been rather uneventful.  He has had no febrile episodes or seizures.  He has seen Dr. Amelia Jo who has been working the patient up for his seizure disorder.  The patient has had an MRI of the brain without contrast on 05/11/2012.  There was some slight progression of an altered signal intensity involving the clivus, otherwise there was no change when the MRI who was compared with previous studies carried out on 11/04/2010 and 04/11/2009.  The patient also had an EEG on 05/22/2012 that showed some background activities in the theta range suggesting a mild encephalopathy of nonspecific etiology.  When we saw [name] on 03/13/2012 he was on Neupogen 300 mcg subcu daily.  We elected at that point to decrease Neupogen to 4 times a week. The patient  did well on this program for about 4 weeks.  In early May his white count was 33.8 with an ANC of 30.0.  We decided to decrease the Neupogen from 300 mcg 4 times a week to 3 times a week.  The cyclosporine dose has remained at 125 mg twice daily.  On 04/25/2012 about 2 weeks after the dose of Neupogen had been decreased to 3 times a week, the white count was 1.2 with an ANC of 0.0.  We made no changes  in treatment, and on 05/17 the white count was 2.6 with an ANC of 0.1.  At that point, we elected to increase the Neupogen back to daily administration with a dose of 300 mcg subcu daily.  The patient has remained on that dose since that time.  On 05/05/2012 white count was back up to 34.5 with an ANC of 30.0, and has remained elevated ever since.  As stated, the patient has not had any fever or infections.  His overall condition remains about the same.  He has not had any seizures.  He is not having any diarrhea at this point, and all seems to be stable.  PHYSICAL EXAMINATION:  The patient shows no obvious changes.  He is now 78 years old as of May 19th.  Weight is 194 pounds.  Height 6 feet 3 inches, body surface area 2.16 m2.  Blood pressure 156/69.  Other vital signs are normal.  There is no scleral icterus.  Mouth and pharynx are benign.  There is no peripheral adenopathy palpable.  Heart/Lungs: Normal.  Abdomen:  Benign with no organomegaly or masses palpable. Extremities:  1 to 2+ edema of both ankles, left greater than right. Neurologic:  Nonfocal.  LABORATORY DATA:  Today, white count 26.8, ANC 22.8, hemoglobin 10.8, hematocrit 34.6, platelets 191,000.  Chemistries today are pending. Chemistries from 03/13/2012 were notable for a BUN of 32, creatinine 1.54, otherwise normal.  Uric acid was 6.5, LDH 223, albumin 4.0. Cyclosporine level on 05/01 was 96.  On 03/13/2012 was 99 and on 02/29/2012 was 68.  IMAGING STUDIES:  1. CT scan of the abdomen and pelvis with IV contrast on 04/29/2011  showed no acute findings within the abdomen or pelvis. There was  some atelectasis at the lung bases.  2. CT angiogram of the chest with IV contrast on 05/01/2011 showed no  evidence of pulmonary embolism. There were moderate bilateral  pleural effusions, left greater than right. There was a left lower  lobe opacity, likely atelectasis with some right basilar  atelectasis.  3. Chest x-ray,  2 view, from 12/03/2011 showed small, left greater  than right pleural effusions with associated opacity: atelectasis  versus infiltrate. 4.MRI of the [name] without IV contrast on 05/11/2012 showed altered signal intensity of the clivus has progressed since the prior exam of 11/04/2010.   IMPRESSION AND PLAN:  At the present time, [name] seems to be doing well.  As stated above, he has been on Neupogen 300 mcg subcu daily.  He was getting along quite well  on 4 doses a week.  However, in early May we made a very slight decrease in the dose from 4 times a week to 3 times a week and with that there was a rather dramatic drop in the white count and ANC.  This corrected fairly promptly when we increased the Neupogen dose back to daily administration.  At this point, we will try to once again lower the Neupogen dose to 4 times  weekly.  I am inclined to leave Neupogen at that dose if his white count and ANC are satisfactory.  I am not inclined to try to make further dose reductions beyond that point.  The patient has done fairly well.  It will be recalled that he has had several episodes of febrile neutropenia associated with hospitalization, but negative cultures.  The patient has done fairly well in recent months without any recurrent episodes of fever or need for hospitalizations.  Will continue the cyclosporine at the current dose of 125 mg twice daily.  Levels have tended to fluctuate somewhat and the patient does have underlying renal insufficiency.  We will plan to check CBC and iron studies in 2 weeks.  In 4 weeks will check CBC and a cyclosporine level.  Six weeks from now will check another CBC and will plan to see the patient again in approximately 8 weeks at which time will check CBC, chemistries, and a cyclosporine level.  The patient has an anemia which we think may be related to renal insufficiency, but in any event, he is tolerating this well.  He is not terribly  active.  He is not on Aranesp at this time, but should his anemia become a problem, then we would consider adding Aranesp to the treatment program.    ______________________________ Samul Dada, M.D. DSM/MEDQ  D:  05/25/2012  T:  05/26/2012  Job:  [id number]

## 2012-05-31 ENCOUNTER — Encounter: Payer: Self-pay | Admitting: Cardiology

## 2012-05-31 ENCOUNTER — Ambulatory Visit (INDEPENDENT_AMBULATORY_CARE_PROVIDER_SITE_OTHER): Payer: Medicare Other | Admitting: Cardiology

## 2012-05-31 ENCOUNTER — Telehealth: Payer: Self-pay | Admitting: *Deleted

## 2012-05-31 VITALS — BP 150/74 | HR 56 | Ht 76.0 in | Wt 196.1 lb

## 2012-05-31 DIAGNOSIS — I2510 Atherosclerotic heart disease of native coronary artery without angina pectoris: Secondary | ICD-10-CM

## 2012-05-31 MED ORDER — HYDROCHLOROTHIAZIDE 25 MG PO TABS
25.0000 mg | ORAL_TABLET | Freq: Every day | ORAL | Status: DC
Start: 2012-05-31 — End: 2013-03-02

## 2012-05-31 MED ORDER — PRAVASTATIN SODIUM 20 MG PO TABS: 20.0000 mg | ORAL_TABLET | Freq: Every day | ORAL | Status: DC

## 2012-05-31 NOTE — Assessment & Plan Note (Signed)
This was presumed based on previous abnormal functional study. However most recent Myoview normal. Continue aspirin and statin.

## 2012-05-31 NOTE — Telephone Encounter (Signed)
Mailed out calendar to inform the patient of the new date and time for labs every two and to see the md in 07-20-2012

## 2012-05-31 NOTE — Patient Instructions (Addendum)
Your physician wants you to follow-up in: ONE YEAR WITH DR Jens Som IN [place] You will receive a reminder letter in the mail two months in advance. If you don't receive a letter, please call our office to schedule the follow-up appointment.   Your physician recommends that you return for lab work WHEN FASTING

## 2012-05-31 NOTE — Assessment & Plan Note (Signed)
Continue statin. Check lipids and liver. 

## 2012-05-31 NOTE — Progress Notes (Signed)
[name] is a 78 y.o. male With a history of presumed CAD based upon an abnormal stress test in the past as well as diabetes, hypertension, hyperlipidemia, prior stroke, aortic root dilatation and rectal carcinoma. Most recent myoview in April of 2012 showed an ejection fraction of 73% and no ischemia. Patient had an echocardiogram during an admission in May of 2012 for sepsis. His ejection fraction was 45-50%. 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 Last echocardiogram was performed in October 2012 and showed normal LV function with mild left atrial enlargement; mildly dilated aortic root. I last saw him in August of 2012. Since then, the patient denies any dyspnea on exertion, orthopnea, PND, palpitations, syncope or chest pain. Mild pedal edema.    Current Outpatient Prescriptions  Medication Sig Dispense Refill  . allopurinol (ZYLOPRIM) 100 MG tablet TAKE 2 TABLETS BY MOUTH TWICE A DAY  120 tablet  3  . aspirin 81 MG tablet Take 81 mg by mouth daily.        . Cholecalciferol (VITAMIN D-3) 5000 UNITS TABS Take by mouth daily.       Marland Kitchen co-enzyme Q-10 30 MG capsule Take 30 mg by mouth 2 (two) times daily.      . cycloSPORINE modified (NEORAL/GENGRAF) 25 MG capsule Take 5 capsules (125 mg total) by mouth twice daily  300 capsule  3  . doxazosin (CARDURA) 4 MG tablet Take 2 mg by mouth at bedtime.       . filgrastim (NEUPOGEN) 300 MCG/ML injection Inject 300 mcg into the skin daily. 4 times a week      . finasteride (PROSCAR) 5 MG tablet TAKE 1 TABLET BY MOUTH EVERY DAY  90 tablet  0  . fish oil-omega-3 fatty acids 1000 MG capsule Take 2 g by mouth daily.        . hydrochlorothiazide (HYDRODIURIL) 25 MG tablet Take 1 tablet (25 mg total) by mouth daily. NEEDS OFFICE VISIT--LAST NOTICE  7 tablet  0  . insulin regular (HUMULIN R,NOVOLIN R) 100 UNIT/ML injection Sliding scale- 4 units 0-150 150-200 6 units 200-250 12 units, 250-300 16 units      . lamoTRIgine (LAMICTAL) 25  MG tablet Take 50 mg by mouth at bedtime.      . metoprolol succinate (TOPROL-XL) 25 MG 24 hr tablet Take 25 mg by mouth daily.       Marland Kitchen oxyCODONE-acetaminophen (PERCOCET) 5-325 MG per tablet Take 2 tablets by mouth Every 4 hours as needed.      . pravastatin (PRAVACHOL) 20 MG tablet TAKE 1 TABLET EVERY DAY...NEEDS OFFICE VISIT  7 tablet  0  . topiramate (TOPAMAX) 50 MG tablet TAKE 1 TAB IN THE AM AND 2 TABS IN THE PM  270 tablet  3     Past Medical History  Diagnosis Date  . Aortic root dilatation   . DM2 (diabetes mellitus, type 2)   . Stroke   . HLD (hyperlipidemia)   . HTN (hypertension)   . CAD (coronary artery disease)     Presumed from previously mildy abnormal myoview;  myoview 4/12: EF 73%, no ischemia  . Leukopenia   . Pneumothorax   . Rectal cancer   . Cardiomyopathy     a. echo 5/12: EF 45-50%, mild LVH, grade 2 diast dysfxn, RAE  . Colon cancer   . Chronic kidney disease     renal insuff, increased uric acid  . Seizures   . Diabetes mellitus   .  BPH (benign prostatic hyperplasia)     increased PSA  . Anemia     Past Surgical History  Procedure Date  . Hip arthroplasty   . Tonsillectomy   . S/p resection of rectal cancer   . Vasectomy   . Ileostomy 12/2010  . Reversal ileostomy 03/2011    History   Social History  . Marital Status: Married    Spouse Name: N/A    Number of Children: N/A  . Years of Education: N/A   Occupational History  . retired    Social History Main Topics  . Smoking status: Never Smoker   . Smokeless tobacco: Never Used  . Alcohol Use: 1.2 oz/week    2 Cans of beer per week  . Drug Use: No  . Sexually Active: Not on file   Other Topics Concern  . Not on file   Social History Narrative  . No narrative on file    ROS: no fevers or chills, productive cough, hemoptysis, dysphasia, odynophagia, melena, hematochezia, dysuria, hematuria, rash, seizure activity, orthopnea, PND, claudication. Remaining systems are  negative.  Physical Exam: Well-developed well-nourished in no acute distress.  Skin is warm and dry.  HEENT is normal.  Neck is supple.  Chest is clear to auscultation with normal expansion.  Cardiovascular exam is regular rate and rhythm.  Abdominal exam nontender or distended. No masses palpated. Extremities show 1+ ankle edema. neuro grossly intact  ECG sinus rhythm at a rate of 56. No ST changes.

## 2012-05-31 NOTE — Assessment & Plan Note (Signed)
LV function has normalized. Previous cardiomyopathy  May have been related to sepsis.

## 2012-05-31 NOTE — Assessment & Plan Note (Signed)
Continue present medications. Check potassium and renal function. 

## 2012-06-01 ENCOUNTER — Encounter: Payer: Self-pay | Admitting: Oncology

## 2012-06-01 NOTE — Progress Notes (Signed)
We received an office note from Dr. Amelia Jo dated 05/29/2012. He  noted that the patient is taking Lamictal 50 mg at bedtime and Topamax 50 mg in the morning and 100 mg at bedtime. Impression was that the patient had nocturnal generalized motor seizures. The plan was to gradually increase the Lamictal to 100 mg twice daily. Followup with Dr. Clarisse Gouge will be around early August.

## 2012-06-07 ENCOUNTER — Telehealth: Payer: Self-pay | Admitting: Oncology

## 2012-06-07 ENCOUNTER — Telehealth: Payer: Self-pay | Admitting: Cardiology

## 2012-06-07 NOTE — Telephone Encounter (Signed)
New Problem:    Patient's wife called in wanting to know if you could call over to the cancer center so her husband could have his cholesterol tested as a part of his other lab works that will be drawn tomorrow.  Please call back.

## 2012-06-07 NOTE — Telephone Encounter (Signed)
wife called to see if we can dro a lipid per dr Eula Listen per [place],per pt dr Jens Som called our lab and gave orders  aom

## 2012-06-07 NOTE — Telephone Encounter (Signed)
Spoke with pt wife, left a detailed message for cancer center lab of labs needed. They will call with any questions.

## 2012-06-08 ENCOUNTER — Telehealth: Payer: Self-pay

## 2012-06-08 ENCOUNTER — Other Ambulatory Visit: Payer: Medicare Other | Admitting: Lab

## 2012-06-08 ENCOUNTER — Encounter: Payer: Self-pay | Admitting: Cardiology

## 2012-06-08 DIAGNOSIS — D72819 Decreased white blood cell count, unspecified: Secondary | ICD-10-CM

## 2012-06-08 DIAGNOSIS — D708 Other neutropenia: Secondary | ICD-10-CM

## 2012-06-08 DIAGNOSIS — Z862 Personal history of diseases of the blood and blood-forming organs and certain disorders involving the immune mechanism: Secondary | ICD-10-CM

## 2012-06-08 LAB — CBC WITH DIFFERENTIAL/PLATELET
BASO%: 0.2 % (ref 0.0–2.0)
Basophils Absolute: 0.1 10*3/uL (ref 0.0–0.1)
EOS%: 0.2 % (ref 0.0–7.0)
MCH: 30.4 pg (ref 27.2–33.4)
MCHC: 32.2 g/dL (ref 32.0–36.0)
MCV: 94.4 fL (ref 79.3–98.0)
MONO%: 1.2 % (ref 0.0–14.0)
RBC: 3.73 10*6/uL — ABNORMAL LOW (ref 4.20–5.82)
RDW: 13.8 % (ref 11.0–14.6)
lymph#: 2.2 10*3/uL (ref 0.9–3.3)

## 2012-06-08 LAB — IRON AND TIBC
Iron: 101 ug/dL (ref 42–165)
TIBC: 349 ug/dL (ref 215–435)
UIBC: 248 ug/dL (ref 125–400)

## 2012-06-08 LAB — FERRITIN: Ferritin: 134 ng/mL (ref 22–322)

## 2012-06-08 NOTE — Telephone Encounter (Signed)
S/w wife clarifying cyclosporin and neupogen dosages. Stated I would call back if any changes to be made.

## 2012-06-09 ENCOUNTER — Telehealth: Payer: Self-pay | Admitting: Oncology

## 2012-06-09 ENCOUNTER — Telehealth: Payer: Self-pay

## 2012-06-09 NOTE — Telephone Encounter (Signed)
S/w wife that there is no changes in pts medication. Verified next lab appt.

## 2012-06-09 NOTE — Telephone Encounter (Signed)
wife aware of ct appt and to p/u contrast on mon or tues

## 2012-06-16 ENCOUNTER — Other Ambulatory Visit: Payer: Self-pay | Admitting: Oncology

## 2012-06-16 DIAGNOSIS — D709 Neutropenia, unspecified: Secondary | ICD-10-CM

## 2012-06-21 ENCOUNTER — Other Ambulatory Visit: Payer: Self-pay | Admitting: Oncology

## 2012-06-21 DIAGNOSIS — D708 Other neutropenia: Secondary | ICD-10-CM

## 2012-06-22 ENCOUNTER — Other Ambulatory Visit: Payer: Medicare Other | Admitting: Lab

## 2012-06-22 ENCOUNTER — Telehealth: Payer: Self-pay | Admitting: Oncology

## 2012-06-22 NOTE — Telephone Encounter (Signed)
called to r/s 7/11 lab as she is ill and r/s to 7/15  aom

## 2012-06-26 ENCOUNTER — Encounter: Payer: Self-pay | Admitting: Oncology

## 2012-06-26 ENCOUNTER — Other Ambulatory Visit (HOSPITAL_BASED_OUTPATIENT_CLINIC_OR_DEPARTMENT_OTHER): Payer: Medicare Other

## 2012-06-26 DIAGNOSIS — D708 Other neutropenia: Secondary | ICD-10-CM

## 2012-06-26 DIAGNOSIS — D72819 Decreased white blood cell count, unspecified: Secondary | ICD-10-CM

## 2012-06-26 LAB — CBC WITH DIFFERENTIAL/PLATELET
Basophils Absolute: 0 10*3/uL (ref 0.0–0.1)
EOS%: 1.3 % (ref 0.0–7.0)
Eosinophils Absolute: 0.1 10*3/uL (ref 0.0–0.5)
HCT: 33.6 % — ABNORMAL LOW (ref 38.4–49.9)
HGB: 11.1 g/dL — ABNORMAL LOW (ref 13.0–17.1)
MCH: 30.1 pg (ref 27.2–33.4)
MONO#: 0.3 10*3/uL (ref 0.1–0.9)
NEUT%: 64.2 % (ref 39.0–75.0)
lymph#: 1.6 10*3/uL (ref 0.9–3.3)

## 2012-06-26 NOTE — Progress Notes (Signed)
Cyclosporine level was 119 ng/ml  (140-330).

## 2012-06-30 ENCOUNTER — Telehealth: Payer: Self-pay | Admitting: Oncology

## 2012-06-30 NOTE — Telephone Encounter (Signed)
s/w spouse and moved up appts

## 2012-07-05 ENCOUNTER — Encounter: Payer: Self-pay | Admitting: Emergency Medicine

## 2012-07-05 ENCOUNTER — Telehealth: Payer: Self-pay | Admitting: *Deleted

## 2012-07-05 NOTE — Telephone Encounter (Signed)
Moved patient lab only appointment to 10-:00am

## 2012-07-06 ENCOUNTER — Other Ambulatory Visit (HOSPITAL_BASED_OUTPATIENT_CLINIC_OR_DEPARTMENT_OTHER): Payer: Medicare Other | Admitting: Lab

## 2012-07-06 DIAGNOSIS — D708 Other neutropenia: Secondary | ICD-10-CM

## 2012-07-06 DIAGNOSIS — D72819 Decreased white blood cell count, unspecified: Secondary | ICD-10-CM

## 2012-07-06 LAB — CBC WITH DIFFERENTIAL/PLATELET
Basophils Absolute: 0.1 10*3/uL (ref 0.0–0.1)
Eosinophils Absolute: 0.1 10*3/uL (ref 0.0–0.5)
HCT: 34.4 % — ABNORMAL LOW (ref 38.4–49.9)
HGB: 11 g/dL — ABNORMAL LOW (ref 13.0–17.1)
LYMPH%: 10.1 % — ABNORMAL LOW (ref 14.0–49.0)
MCV: 94.7 fL (ref 79.3–98.0)
MONO%: 1.4 % (ref 0.0–14.0)
NEUT#: 19.4 10*3/uL — ABNORMAL HIGH (ref 1.5–6.5)
NEUT%: 87.8 % — ABNORMAL HIGH (ref 39.0–75.0)
Platelets: 176 10*3/uL (ref 140–400)

## 2012-07-18 ENCOUNTER — Other Ambulatory Visit: Payer: Self-pay

## 2012-07-18 NOTE — Telephone Encounter (Signed)
Of these prescriptions are okay to be called in. He is to check his sugar 3 times a day because he is on sliding scale insulin. He needs 90 test strips per month. He also needs the metoprolol tartrate refill. He is a type II diabetic who is on insulin. This is due to his multiple other medical problems.

## 2012-07-18 NOTE — Telephone Encounter (Signed)
Pt wife went to p/u his test strips today at [facility] and they were $140.00 (usually free, covered by Medicare). I called [facility] and the pharmacist says due to new Medicare rules they need a new RX that includes diagnosis codes and directions for use in order for Medicare to pay Pt also need a refill of metropolol, has appt with Dr. Cleta Alberts 8/20  [name]   [phone]

## 2012-07-18 NOTE — Telephone Encounter (Signed)
Both of these prescriptions are okay to be to prove he is to check his glucose

## 2012-07-18 NOTE — Telephone Encounter (Signed)
Called pharm to check what type test strips are needed and what last Rx for Metoprolol was. Pharmacist explained that Rx for test strips NEED TO BE PRINTED AND FAXED for Medicare to pay and have to have Dx code and frequency of testing. Pt has been taking Metoprolol succ 25 mg QD. Dr Cleta Alberts, you were not the last MD to Rx these for pt so I wanted to make sure you wanted to Rx. Please check Dx code. It looks like pt has type II and it is controlled, but he is on insulin ( it looks like 250.00 is non insulin).

## 2012-07-20 ENCOUNTER — Telehealth: Payer: Self-pay

## 2012-07-20 ENCOUNTER — Ambulatory Visit (HOSPITAL_BASED_OUTPATIENT_CLINIC_OR_DEPARTMENT_OTHER): Payer: Medicare Other | Admitting: Oncology

## 2012-07-20 ENCOUNTER — Telehealth: Payer: Self-pay | Admitting: Oncology

## 2012-07-20 ENCOUNTER — Other Ambulatory Visit (HOSPITAL_BASED_OUTPATIENT_CLINIC_OR_DEPARTMENT_OTHER): Payer: Medicare Other | Admitting: Lab

## 2012-07-20 ENCOUNTER — Encounter: Payer: Self-pay | Admitting: Oncology

## 2012-07-20 VITALS — BP 139/73 | HR 68 | Temp 97.0°F | Resp 20 | Ht 76.0 in | Wt 199.5 lb

## 2012-07-20 DIAGNOSIS — C187 Malignant neoplasm of sigmoid colon: Secondary | ICD-10-CM

## 2012-07-20 DIAGNOSIS — D708 Other neutropenia: Secondary | ICD-10-CM

## 2012-07-20 DIAGNOSIS — D72819 Decreased white blood cell count, unspecified: Secondary | ICD-10-CM

## 2012-07-20 DIAGNOSIS — D649 Anemia, unspecified: Secondary | ICD-10-CM

## 2012-07-20 DIAGNOSIS — Z862 Personal history of diseases of the blood and blood-forming organs and certain disorders involving the immune mechanism: Secondary | ICD-10-CM

## 2012-07-20 DIAGNOSIS — N289 Disorder of kidney and ureter, unspecified: Secondary | ICD-10-CM

## 2012-07-20 DIAGNOSIS — D709 Neutropenia, unspecified: Secondary | ICD-10-CM

## 2012-07-20 LAB — CBC WITH DIFFERENTIAL/PLATELET
BASO%: 0.3 % (ref 0.0–2.0)
LYMPH%: 9.3 % — ABNORMAL LOW (ref 14.0–49.0)
MCHC: 32.6 g/dL (ref 32.0–36.0)
MCV: 93.7 fL (ref 79.3–98.0)
MONO%: 2.2 % (ref 0.0–14.0)
NEUT%: 87.7 % — ABNORMAL HIGH (ref 39.0–75.0)
Platelets: 194 10*3/uL (ref 140–400)
RBC: 3.6 10*6/uL — ABNORMAL LOW (ref 4.20–5.82)

## 2012-07-20 NOTE — Progress Notes (Signed)
CC:   [name]. Cleta Alberts, M.D. Vania Rea. Jarold Motto, MD, [place], FACP, FAGA Rachael Fee, MD Clovis Pu Cornett, M.D. Simonne Come, MD Laren Everts, DO Rene Kocher, M.D.  PROBLEM LIST:  1. 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 The patient  had bone marrow on 04/03/2009 and again on 04/08/2010. FISH  studies were negative as was the bone marrow. Neupogen was started in late  December 2011. Cyclosporine (Gengraf) was started on August 04, 2011.  2. 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 The tumor was located in the proximal rectum first  detected on colonoscopy on 09/03/2010. Laparoscopic-assisted low  anterior resection with a diverting loop ileostomy was carried out  on 12/23/2010. Closure of the loop ileostomy occurred on  04/06/2011. The patient is without evidence of recurrence.  Colonoscopy carried out by Dr. Sheryn Bison 12/22/2011 was  without significant findings.  3. Renal insufficiency.  4. Hyperuricemia.  5. Anemia possibly related to renal insufficiency.  6. Possible seizures dating back to December 2009.  7. History of C difficile diarrhea.  8. Diabetes mellitus.  9. Hypertension.  10.Dyslipidemia.  11.BPH.  12.History of skin cancers.  13.Status post left total hip replacement.  14.Coronary artery disease.  15.History of recurrent febrile neutropenia with negative cultures  requiring hospital admissions.    MEDICATIONS:  1. Allopurinol 200 mg twice a day.  2. Aspirin 81 mg daily.  3. Vitamin D3 5000 units daily.  4. Cyclosporine 125 mg twice a day. Dosage was increased  from 100 mg twice a day to 125 mg twice a day on 03/10/2012.  Cyclosporine was started in August 2012.  5. Cardura 2 mg at bedtime.  6. Proscar 5 mg daily.  7. Fish oil omega-3 fatty acids 2 g daily.  8.  Hydrochlorothiazide 25 mg every other day.  9. Humulin R 100 units/mL sliding scale.  10.Lisinopril 10 mg daily.  11.Toprol-XL 12.5 mg daily.  12.Neupogen 300 mcg subcu currently 4 times per week. Neupogen was started in late December 2011.  13.Percocet 5/325 mg as needed.  14.Pravachol 20 mg daily.  15.Topamax 50 mg twice daily. 16.Lamictal 100 mg twice daily.   SMOKING HISTORY:  The patient has been a nonsmoker.   HISTORY:  I saw Bertran Zeimet today for followup of his autoimmune neutropenia.  We also follow Mr. 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  Mr. Hayashida was accompanied by his wife, [name].  He was last seen by [place] on 05/25/2012.  The patient's condition over the past couple of months has remained uneventful.  Specifically, blood counts have held up well on the current treatment program of Neupogen being given 300 mcg subcu 4 times weekly, specifically on Monday, Wednesday, Friday, and Saturday.  The patient is also on cyclosporine 125 mg twice daily.  He denies any symptoms to suggest recurrent colon cancer.  Bowels are without diarrhea, constipation, or blood.  The patient also is being followed by Dr. Amelia Jo for probable seizures.  Adjustments are being made in his medicines by Dr. Clarisse Gouge. The patient reports no recent events over the past couple of months.  In short, he is doing well with no new complaints.  PHYSICAL EXAMINATION:  General:  He looks well.  Vital Signs:  Weight  is 199.5 pounds, height 6 feet 4 inches, body surface area 2.2 sq m.  Blood pressure 139/73.  Other vital signs are normal.  HEENT:  There is no scleral icterus.  Mouth and pharynx are benign.  Lymph:  There is no peripheral adenopathy palpable.  Heart and Lungs:  Normal.  Abdomen: Benign with no organomegaly or masses palpable.  Extremities:  2+ edema of the  left ankle, 1+ edema of the right ankle.  Neurologic Exam: Normal.  LABORATORY DATA:  Today white count 20.7, ANC 18.1, hemoglobin 11.0, hematocrit 33.7, and platelets 194,000.  Chemistries and cyclosporine level today are pending.  Chemistries from 06/08/2012 were notable for a BUN of 32, creatinine 1.30, glucose 121, alkaline phosphatase 282.  LDH was 137.  On 06/08/2012, ferritin was 134, iron saturation 29.  On 10/29/2011, ferritin was 232, iron saturation 36.  Cyclosporine level on 06/26/2012 was 119.  On 04/12/2012 cyclosporine level was 96.  Last vitamin B12 level was on 06/15/2011 and was 1199.  Last CEA level was 1.3 on 01/14/2012.   IMAGING STUDIES:  1. CT scan of the abdomen and pelvis with IV contrast on 04/29/2011  showed no acute findings within the abdomen or pelvis. There was  some atelectasis at the lung bases.  2. CT angiogram of the chest with IV contrast on 05/01/2011 showed no  evidence of pulmonary embolism. There were moderate bilateral  pleural effusions, left greater than right. There was a left lower  lobe opacity, likely atelectasis with some right basilar  atelectasis.  3. Chest x-ray, 2 view, from 12/03/2011 showed small, left greater  than right pleural effusions with associated opacity: atelectasis  versus infiltrate.  4.MRI of the [name] without IV contrast on 05/11/2012 showed altered signal  intensity of the clivus has progressed since the prior exam of  11/04/2010.   IMPRESSION AND PLAN:  Mr. Brammer seems to be doing well.  He is currently on Neupogen 300 mcg subcu 4 doses weekly, specifically Monday, Wednesday, Friday, and Saturday.  He has had no febrile episodes.  His white count actually has remained elevated without neutropenia.  The patient is on cyclosporine, but the dose is not clear.  We thought he was on 125 mg twice daily.  This will need to be confirmed.  The patient has some mild anemia which we attribute to renal insufficiency  We  will make no changes in the treatment plan.  We will check a CBC along with iron studies and vitamin B12 level in 1 month.  In 2 months, we will check another CBC.  In 3 months, which should be around November 8th, we will plan to see Mr. Schmuhl.  We will check CBC, chemistries, and a cyclosporine level, which is carried out on the whole blood and represents a trough level.    ______________________________ Samul Dada, M.D. DSM/MEDQ  D:  07/20/2012  T:  07/20/2012  Job:  [id number]

## 2012-07-20 NOTE — Telephone Encounter (Signed)
aware of appts   aom °

## 2012-07-20 NOTE — Progress Notes (Signed)
This office note has been dictated.  #[id number]

## 2012-07-20 NOTE — Telephone Encounter (Signed)
Clarified w/wife that pt is taking 5 (125mg  total) cyclosporin BID. She told RN and MD conflicting data during visit.

## 2012-07-21 LAB — COMPREHENSIVE METABOLIC PANEL
Albumin: 4.2 g/dL (ref 3.5–5.2)
Alkaline Phosphatase: 141 U/L — ABNORMAL HIGH (ref 39–117)
BUN: 30 mg/dL — ABNORMAL HIGH (ref 6–23)
CO2: 27 mEq/L (ref 19–32)
Glucose, Bld: 136 mg/dL — ABNORMAL HIGH (ref 70–99)
Potassium: 4.9 mEq/L (ref 3.5–5.3)
Sodium: 139 mEq/L (ref 135–145)
Total Bilirubin: 0.5 mg/dL (ref 0.3–1.2)
Total Protein: 6.8 g/dL (ref 6.0–8.3)

## 2012-07-21 LAB — LACTATE DEHYDROGENASE: LDH: 139 U/L (ref 94–250)

## 2012-07-21 LAB — CYCLOSPORINE LEVEL: Cyclosporine, Blood: 124 ng/mL — ABNORMAL LOW (ref 140–330)

## 2012-07-21 MED ORDER — GLUCOSE BLOOD VI STRP: ORAL_STRIP | Status: DC

## 2012-07-21 MED ORDER — METOPROLOL SUCCINATE ER 25 MG PO TB24: 25.0000 mg | ORAL_TABLET | Freq: Every day | ORAL | Status: DC

## 2012-07-21 NOTE — Telephone Encounter (Signed)
Open encounter from 8/6, cannot tell if meds have been called in.

## 2012-07-21 NOTE — Telephone Encounter (Signed)
Rx's printed, signed, and will fax into [facility].

## 2012-08-01 ENCOUNTER — Encounter: Payer: Self-pay | Admitting: Emergency Medicine

## 2012-08-01 ENCOUNTER — Ambulatory Visit (INDEPENDENT_AMBULATORY_CARE_PROVIDER_SITE_OTHER): Payer: Medicare Other | Admitting: Emergency Medicine

## 2012-08-01 ENCOUNTER — Telehealth: Payer: Self-pay | Admitting: *Deleted

## 2012-08-01 VITALS — BP 149/67 | HR 63 | Temp 97.0°F | Resp 20 | Ht 74.0 in | Wt 200.0 lb

## 2012-08-01 DIAGNOSIS — I10 Essential (primary) hypertension: Secondary | ICD-10-CM

## 2012-08-01 DIAGNOSIS — D2360 Other benign neoplasm of skin of unspecified upper limb, including shoulder: Secondary | ICD-10-CM

## 2012-08-01 DIAGNOSIS — R609 Edema, unspecified: Secondary | ICD-10-CM

## 2012-08-01 DIAGNOSIS — L989 Disorder of the skin and subcutaneous tissue, unspecified: Secondary | ICD-10-CM

## 2012-08-01 DIAGNOSIS — E119 Type 2 diabetes mellitus without complications: Secondary | ICD-10-CM

## 2012-08-01 LAB — CBC WITH DIFFERENTIAL/PLATELET
Basophils Absolute: 0.1 10*3/uL (ref 0.0–0.1)
Basophils Relative: 0 % (ref 0–1)
Eosinophils Absolute: 0.1 10*3/uL (ref 0.0–0.7)
Eosinophils Relative: 1 % (ref 0–5)
MCH: 30.6 pg (ref 26.0–34.0)
MCV: 88.9 fL (ref 78.0–100.0)
Platelets: 209 10*3/uL (ref 150–400)
RDW: 14 % (ref 11.5–15.5)
WBC: 17.8 10*3/uL — ABNORMAL HIGH (ref 4.0–10.5)

## 2012-08-01 LAB — COMPREHENSIVE METABOLIC PANEL
ALT: 12 U/L (ref 0–53)
AST: 16 U/L (ref 0–37)
Creat: 1.49 mg/dL — ABNORMAL HIGH (ref 0.50–1.35)
Total Bilirubin: 0.4 mg/dL (ref 0.3–1.2)

## 2012-08-01 MED ORDER — FUROSEMIDE 20 MG PO TABS: 20.0000 mg | ORAL_TABLET | Freq: Every day | ORAL | Status: DC

## 2012-08-01 NOTE — Progress Notes (Signed)
  Subjective:    Patient ID: [name], male    DOB: [birth year], 78 y.o.   MRN: [id number]  HPI patient in for followup of his diabetes. He has autoimmune hepatitis. He also had a malignancy in his rectal area. He overall is doing well. He's been on sliding scale insulin for treatment of his diabetes. His wife has been in charge of this. He is no longer very mobile and is not able to drive. She states his sugars jump up at times however he has had no spells. He has a persistent raised firm area on the top of his head as well as another area on the lateral side of his left hand.    Review of Systems     Objective:   Physical Exam there is a 1 cm inflamed what appears to be cutaneous horn on the top of his head and the second for appears to be cutaneous horn over the ulnar area of the left hand . His chest is clear his cardiac exam is unremarkable his abdomen is soft and nontender. Examination of his feet revealed no ulcerations with good pulses .  Results for orders placed in visit on 08/01/12  GLUCOSE, POCT (MANUAL RESULT ENTRY)      Component Value Range   POC Glucose 107 (*) 70 - 99 mg/dl  POCT GLYCOSYLATED HEMOGLOBIN (HGB A1C)      Component Value Range   Hemoglobin A1C 6.2          Assessment & Plan:  He does have some swelling of his lower extremities. I'm going to place him on Lasix 20 mg one a day #30. I let Dr. Ludwig Clarks office know that we had done this. I made a referral to Dr.Tafeen to evaluate a lesion on his scalp and left hand as I am very suspicious there may be a cancer at the base of these cutaneous horns.

## 2012-08-01 NOTE — Telephone Encounter (Signed)
Received call from dr Cleta Alberts, he saw the pt today and the pt has a small amount of swelling in his legs. Dr Cleta Alberts wanted to make sure with dr Jens Som there is no problem with the pt taking furosemide 20 mg once daily. Will forward for dr Jens Som review

## 2012-08-01 NOTE — Telephone Encounter (Signed)
If lasix added, would need close fu of bmet as pt has baseline renal insuff [name]

## 2012-08-02 ENCOUNTER — Telehealth: Payer: Self-pay

## 2012-08-02 NOTE — Telephone Encounter (Signed)
Please call and talk to Mr. Fullen's wife and let her know we need to do a B met  on patient in one month. Please call Dr. Ludwig Clarks office and let them know I put his HCTZ on hold and he will be taking Lasix 20 mg a day

## 2012-08-02 NOTE — Telephone Encounter (Signed)
I spoke to patients wife and she is aware the BMET will be done here. Dr Cleta Alberts wants Dr Jens Som to be aware of medication changes. HCTZ has been held and patient is on Lasix 20mg  per day. Please advise if anything else is needed.

## 2012-08-02 NOTE — Telephone Encounter (Signed)
Amy sent phone message on to Dr Ludwig Clarks nurse to notify of pt's medication changes.

## 2012-08-02 NOTE — Telephone Encounter (Signed)
Dr. Waunita Schooner office wants to make sure we are going to follow the patient an do the BMET.  FYI ONLY   [phone]

## 2012-08-02 NOTE — Telephone Encounter (Signed)
Spoke with April in dr daubs office, she will make dr daub aware of dr Ludwig Clarks recommendations.

## 2012-08-10 ENCOUNTER — Other Ambulatory Visit: Payer: Self-pay | Admitting: Physician Assistant

## 2012-08-10 NOTE — Telephone Encounter (Signed)
Okay to prove both medications for a year

## 2012-08-10 NOTE — Telephone Encounter (Signed)
Ok to refill x 6 months 

## 2012-08-18 ENCOUNTER — Encounter: Payer: Self-pay | Admitting: Medical Oncology

## 2012-08-18 ENCOUNTER — Other Ambulatory Visit (HOSPITAL_BASED_OUTPATIENT_CLINIC_OR_DEPARTMENT_OTHER): Payer: Medicare Other | Admitting: Lab

## 2012-08-18 DIAGNOSIS — Z862 Personal history of diseases of the blood and blood-forming organs and certain disorders involving the immune mechanism: Secondary | ICD-10-CM

## 2012-08-18 DIAGNOSIS — D539 Nutritional anemia, unspecified: Secondary | ICD-10-CM | POA: Insufficient documentation

## 2012-08-18 DIAGNOSIS — D72819 Decreased white blood cell count, unspecified: Secondary | ICD-10-CM

## 2012-08-18 DIAGNOSIS — D708 Other neutropenia: Secondary | ICD-10-CM

## 2012-08-18 LAB — CBC WITH DIFFERENTIAL/PLATELET
BASO%: 2.2 % — ABNORMAL HIGH (ref 0.0–2.0)
EOS%: 2.3 % (ref 0.0–7.0)
MCH: 31.3 pg (ref 27.2–33.4)
MCHC: 33.2 g/dL (ref 32.0–36.0)
MONO#: 0.3 10*3/uL (ref 0.1–0.9)
NEUT%: 20.7 % — ABNORMAL LOW (ref 39.0–75.0)
RBC: 3.52 10*6/uL — ABNORMAL LOW (ref 4.20–5.82)
RDW: 14.5 % (ref 11.0–14.6)
WBC: 2.7 10*3/uL — ABNORMAL LOW (ref 4.0–10.3)
lymph#: 1.8 10*3/uL (ref 0.9–3.3)

## 2012-08-18 LAB — IRON AND TIBC
%SAT: 14 % — ABNORMAL LOW (ref 20–55)
Iron: 48 ug/dL (ref 42–165)
TIBC: 346 ug/dL (ref 215–435)

## 2012-08-19 ENCOUNTER — Other Ambulatory Visit: Payer: Self-pay | Admitting: Emergency Medicine

## 2012-08-21 ENCOUNTER — Telehealth: Payer: Self-pay | Admitting: Medical Oncology

## 2012-08-21 NOTE — Telephone Encounter (Signed)
I called pt and spoke with wife regarding WBC. I informed her his count has dropped and asked if he has missed any doses of Neupogen. She states no. She is puzzled why it will be good and then drop suddenly. I told her Dr. Arline Asp is also puzzled by this. She asked if his Vit B12 level was low and I told her it was normal and so is the ferritin level. Dr. Arline Asp updated.

## 2012-09-05 ENCOUNTER — Other Ambulatory Visit (HOSPITAL_COMMUNITY): Payer: Self-pay | Admitting: Oncology

## 2012-09-15 ENCOUNTER — Other Ambulatory Visit (HOSPITAL_BASED_OUTPATIENT_CLINIC_OR_DEPARTMENT_OTHER): Payer: Medicare Other

## 2012-09-15 DIAGNOSIS — D708 Other neutropenia: Secondary | ICD-10-CM

## 2012-09-15 LAB — CBC WITH DIFFERENTIAL/PLATELET
BASO%: 0.9 % (ref 0.0–2.0)
Eosinophils Absolute: 0.1 10*3/uL (ref 0.0–0.5)
HCT: 33.4 % — ABNORMAL LOW (ref 38.4–49.9)
LYMPH%: 27.6 % (ref 14.0–49.0)
MCHC: 33.3 g/dL (ref 32.0–36.0)
MCV: 94.6 fL (ref 79.3–98.0)
MONO#: 0.3 10*3/uL (ref 0.1–0.9)
MONO%: 3.8 % (ref 0.0–14.0)
NEUT%: 65.9 % (ref 39.0–75.0)
Platelets: 205 10*3/uL (ref 140–400)
WBC: 7.5 10*3/uL (ref 4.0–10.3)

## 2012-09-16 IMAGING — CT CT CHEST W/ CM
2 of 5 series · 15 of 36 positions shown, 18 images · IV contrast (agent unspecified)
Comparison: None

CT CHEST

CLINICAL DATA: Adenocarcinoma of the anus

CT CHEST, ABDOMEN AND PELVIS WITH CONTRAST
TECHNIQUE: Multidetector CT imaging of the chest, abdomen and
pelvis was performed following the standard protocol during bolus
administration of intravenous contrast.
Contrast: 125 ml of omni 300

[Series 2: cap with · axial · 0.80mm/px · z∈[-696,-72]mm · 12 of 143 slices shown, 15 images]
[im 9/143  mediastinal]
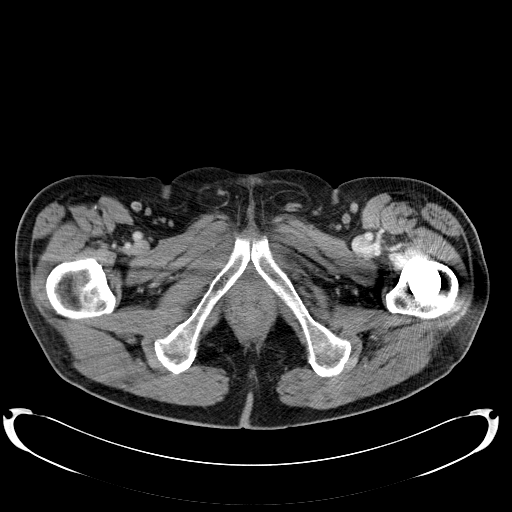
[im 9/143  lung]
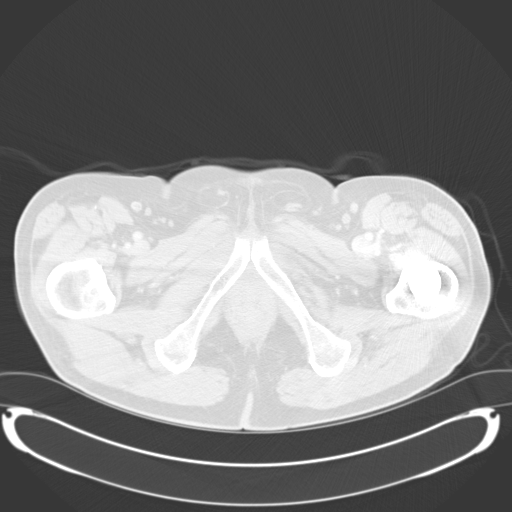
[im 18/143  lung]
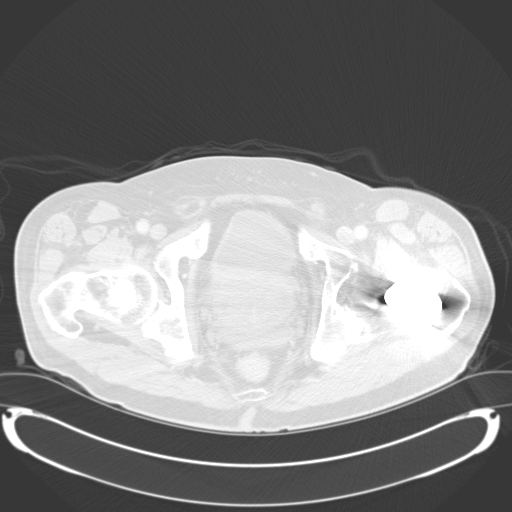
[im 36/143  lung]
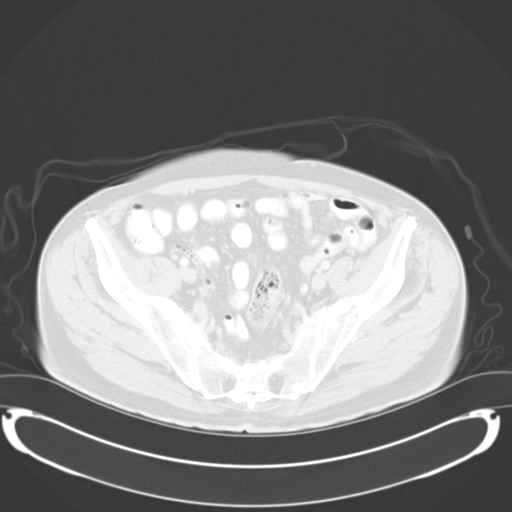
[im 45/143  lung]
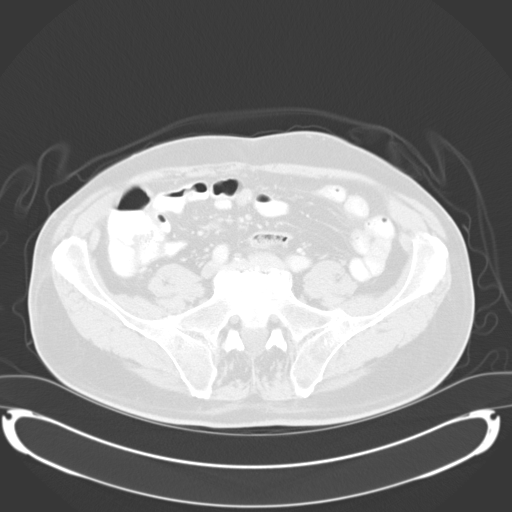
[im 54/143  mediastinal]
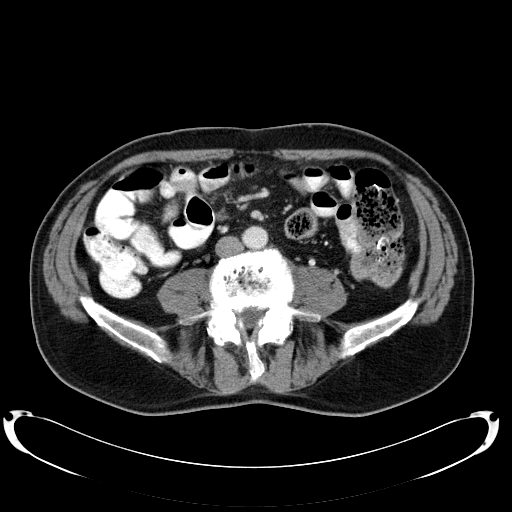
[im 54/143  lung]
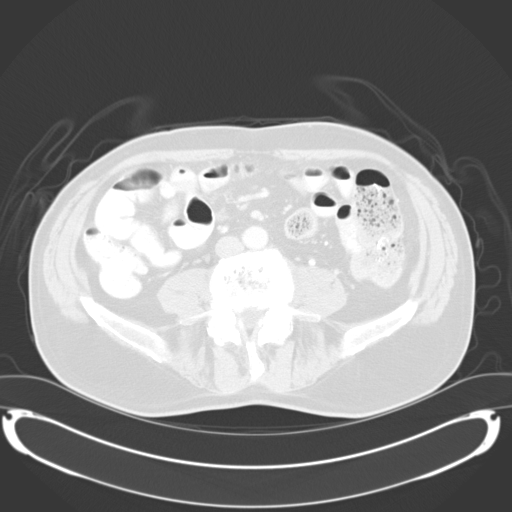
[im 63/143  lung]
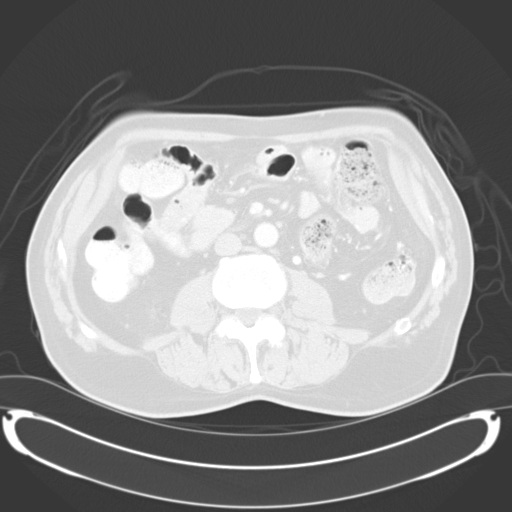
[im 80/143  lung]
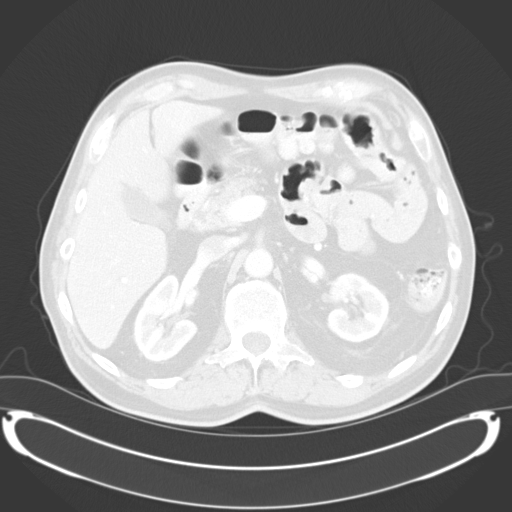
[im 89/143  lung]
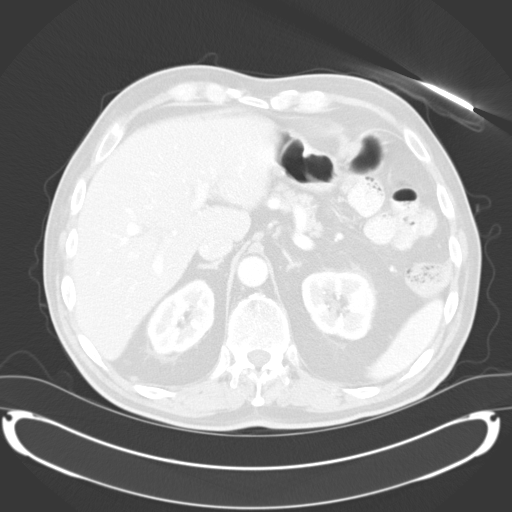
[im 98/143  mediastinal]
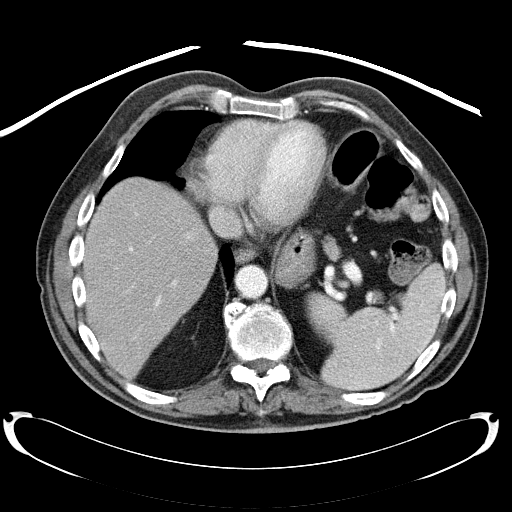
[im 98/143  lung]
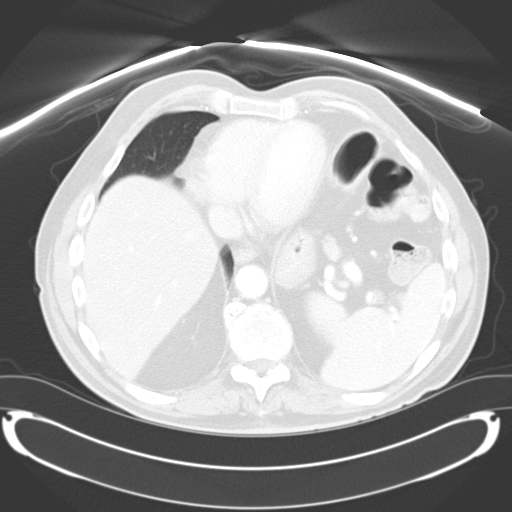
[im 107/143  lung]
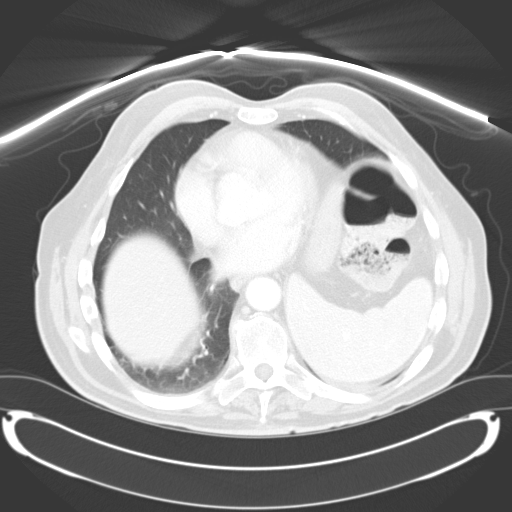
[im 125/143  lung]
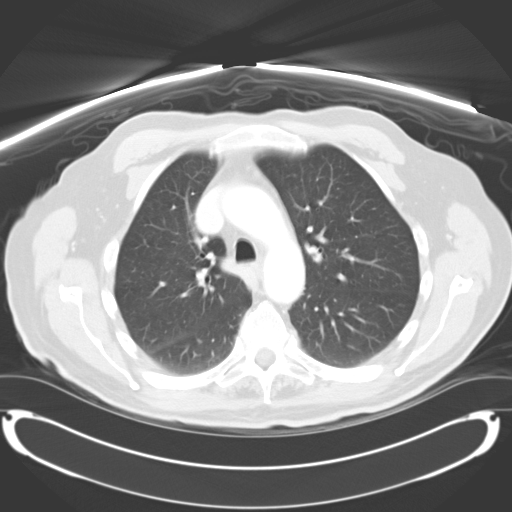
[im 134/143  lung]
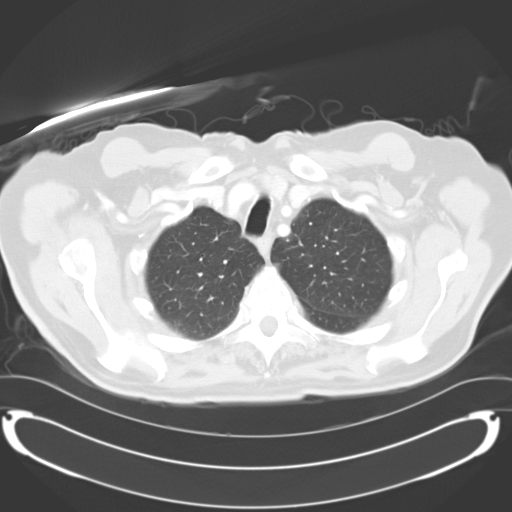

[Series 602: <mpr range> · coronal · 1.42mm/px · 3 of 154 slices shown]
[im 31/154  lung]
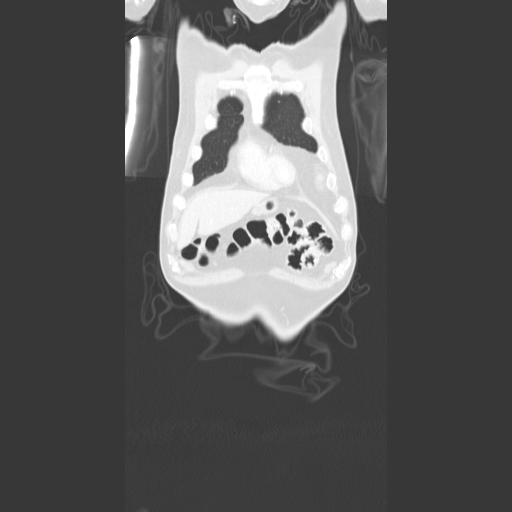
[im 62/154  lung]
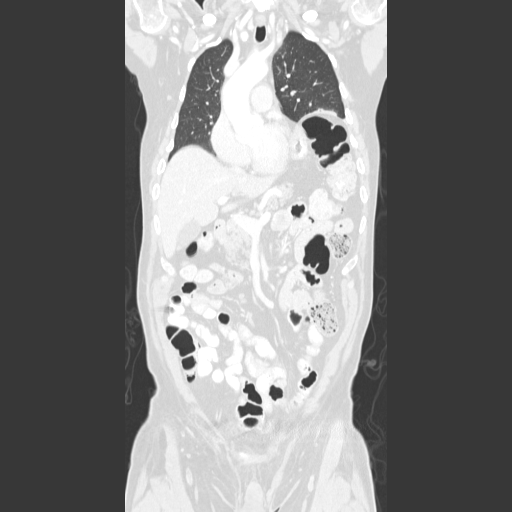
[im 92/154  lung]
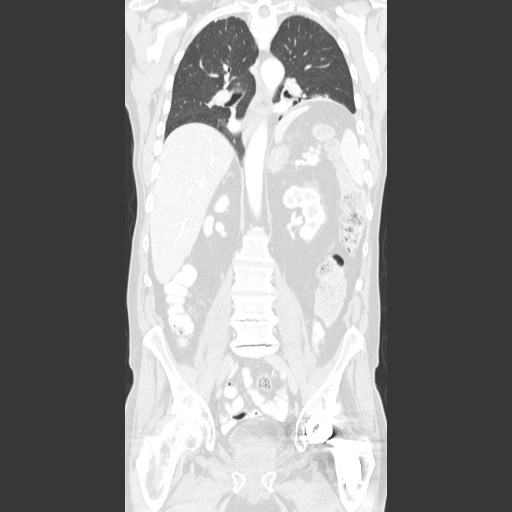

[15 of 36 positions shown; findings below may reference images not displayed]

FINDINGS: No enlarged axillary or supraclavicular lymph nodes.

There is no enlarged mediastinal or hilar lymph nodes.

There is no pericardial or pleural effusion identified.

Nonspecific pulmonary nodule within the superior segment of the
right lower lobe measures 4.2 mm, image 25.

Scar versus plate-like atelectasis is noted in the right base.

There is asymmetric elevation of the left hemidiaphragm.

Calcified granuloma is noted within the left upper lobe.

Review of the visualized osseous structures is significant for mild
degenerative disc disease within the thoracic spine.

No aggressive appearing bone lesions identified.
IMPRESSION: 1.  No active cardiopulmonary abnormalities.
2.  Nonspecific pulmonary nodule is identified in the right lower
lobe. If the patient is at high risk for bronchogenic carcinoma,
follow-up chest CT at 1 year is recommended.  If the patient is at
low risk, no follow-up is needed.  This recommendation follows the
consensus statement: Guidelines for Management of Small Pulmonary
Nodules Detected on CT Scans:  A Statement from the [employer]
online at:  [URL]

CT ABDOMEN AND PELVIS
FINDINGS: Both of the adrenal glands appear normal.

There is no focal liver abnormalities identified.

The gallbladder appears normal.

No biliary dilatation.  The pancreas is normal. There are.  The
right kidney is normal.  There is a cyst within the inferior pole
of the left kidney measuring 2.3 cm.

The spleen is enlarged measuring 14 cm in craniocaudal dimension.

There is no ascites within the upper abdomen.

The upper abdominal bowel loops appear normal in course and caliber
without obstruction.

There are no enlarged retroperitoneal or small bowel mesenteric
lymph nodes identified.

No enlarged pelvic lymph nodes.

The urinary bladder appears normal.

Enlargement of the prostate gland is identified.  The bowel loops
within the abdomen and pelvis have a normal course and caliber.
The mass within the sigmoid colon is not well visualized on today's
exam.

Review of the visualized osseous structures shows a left hip
arthroplasty device.  There is degenerative disc disease and facet
degenerative change within the lumbar spine.
IMPRESSION: 1.  There are no specific features identified to suggest metastatic
disease within the abdomen or pelvis.
2.  The sigmoid colon mass is difficult to visualize on the current
exam.
3.  Splenomegaly.

## 2012-09-26 ENCOUNTER — Other Ambulatory Visit: Payer: Self-pay | Admitting: Physician Assistant

## 2012-09-27 ENCOUNTER — Other Ambulatory Visit: Payer: Self-pay | Admitting: Emergency Medicine

## 2012-09-27 NOTE — Telephone Encounter (Signed)
Pt's wife called stated pt needs prescription one touch ultra strips called into  Pharmacy.  [facility] [address]  Please let pt know when done. [phone]

## 2012-09-28 ENCOUNTER — Other Ambulatory Visit: Payer: Self-pay | Admitting: Emergency Medicine

## 2012-09-29 ENCOUNTER — Other Ambulatory Visit: Payer: Self-pay | Admitting: Emergency Medicine

## 2012-10-01 NOTE — Telephone Encounter (Signed)
rx sent in 

## 2012-10-12 ENCOUNTER — Telehealth: Payer: Self-pay | Admitting: *Deleted

## 2012-10-12 NOTE — Telephone Encounter (Signed)
Patient wife calling, unable to make appt on 10/16/12 due to prior commitment and daughter in hospital and possibly to be transferred to another facility. [name] said they should be able to make any other day that week. After discussion with Dr Arline Asp, we can move appt to Wednesday 10/18/12 @0900am  to see Annice Pih, NP.  Dr. Arline Asp will be here in the office, just not seeing scheduled patients at this time.

## 2012-10-13 ENCOUNTER — Telehealth: Payer: Self-pay | Admitting: Oncology

## 2012-10-13 ENCOUNTER — Telehealth: Payer: Self-pay | Admitting: *Deleted

## 2012-10-13 NOTE — Telephone Encounter (Signed)
per orders from 10-12-2012 moved from 10-16-2012 to 10-18-2012 patient confirmed over the phone the new date and time

## 2012-10-13 NOTE — Telephone Encounter (Signed)
Moved time of 11/6 appt to 8:30 am due to this is a Wednesday. Per message from desk nurse 10/31 appt w/[facility] 9am 11/6. Per desk nurse DM will be here in the AM. lmonvm for pt re new time.

## 2012-10-16 ENCOUNTER — Other Ambulatory Visit: Payer: Medicare Other | Admitting: Lab

## 2012-10-16 ENCOUNTER — Ambulatory Visit: Payer: Medicare Other | Admitting: Family

## 2012-10-18 ENCOUNTER — Ambulatory Visit: Payer: Medicare Other | Admitting: Family

## 2012-10-18 ENCOUNTER — Other Ambulatory Visit: Payer: Medicare Other | Admitting: Lab

## 2012-10-18 ENCOUNTER — Other Ambulatory Visit: Payer: Medicare Other

## 2012-10-19 ENCOUNTER — Telehealth: Payer: Self-pay

## 2012-10-19 ENCOUNTER — Telehealth: Payer: Self-pay | Admitting: Oncology

## 2012-10-19 ENCOUNTER — Other Ambulatory Visit: Payer: Self-pay

## 2012-10-19 NOTE — Telephone Encounter (Signed)
lmonvm for pt re appt for 11/18.  °

## 2012-10-19 NOTE — Telephone Encounter (Signed)
Returned call from wife that pt missed appt yesterday d/t their daughter being in [facility]. POF sent to scheduler. Wife reassured we will reschedule and call her.

## 2012-10-20 ENCOUNTER — Other Ambulatory Visit: Payer: Medicare Other | Admitting: Lab

## 2012-10-20 ENCOUNTER — Ambulatory Visit: Payer: Medicare Other | Admitting: Oncology

## 2012-10-30 ENCOUNTER — Ambulatory Visit (HOSPITAL_BASED_OUTPATIENT_CLINIC_OR_DEPARTMENT_OTHER): Payer: Medicare Other | Admitting: Family

## 2012-10-30 ENCOUNTER — Other Ambulatory Visit (HOSPITAL_BASED_OUTPATIENT_CLINIC_OR_DEPARTMENT_OTHER): Payer: Medicare Other

## 2012-10-30 ENCOUNTER — Encounter: Payer: Self-pay | Admitting: Family

## 2012-10-30 ENCOUNTER — Telehealth: Payer: Self-pay | Admitting: Oncology

## 2012-10-30 VITALS — BP 150/73 | HR 59 | Temp 97.0°F | Resp 20 | Ht 74.0 in | Wt 204.1 lb

## 2012-10-30 DIAGNOSIS — D649 Anemia, unspecified: Secondary | ICD-10-CM

## 2012-10-30 DIAGNOSIS — Z862 Personal history of diseases of the blood and blood-forming organs and certain disorders involving the immune mechanism: Secondary | ICD-10-CM

## 2012-10-30 DIAGNOSIS — D72819 Decreased white blood cell count, unspecified: Secondary | ICD-10-CM

## 2012-10-30 DIAGNOSIS — C187 Malignant neoplasm of sigmoid colon: Secondary | ICD-10-CM

## 2012-10-30 DIAGNOSIS — D72829 Elevated white blood cell count, unspecified: Secondary | ICD-10-CM

## 2012-10-30 DIAGNOSIS — N289 Disorder of kidney and ureter, unspecified: Secondary | ICD-10-CM

## 2012-10-30 DIAGNOSIS — D708 Other neutropenia: Secondary | ICD-10-CM

## 2012-10-30 LAB — COMPREHENSIVE METABOLIC PANEL (CC13)
ALT: 20 U/L (ref 0–55)
Albumin: 3.9 g/dL (ref 3.5–5.0)
Alkaline Phosphatase: 150 U/L (ref 40–150)
CO2: 28 mEq/L (ref 22–29)
Glucose: 147 mg/dl — ABNORMAL HIGH (ref 70–99)
Potassium: 4.7 mEq/L (ref 3.5–5.1)
Sodium: 137 mEq/L (ref 136–145)
Total Protein: 6.8 g/dL (ref 6.4–8.3)

## 2012-10-30 LAB — LACTATE DEHYDROGENASE (CC13): LDH: 136 U/L (ref 125–245)

## 2012-10-30 LAB — CBC WITH DIFFERENTIAL/PLATELET
Basophils Absolute: 0.1 10*3/uL (ref 0.0–0.1)
Eosinophils Absolute: 0.1 10*3/uL (ref 0.0–0.5)
HCT: 33.2 % — ABNORMAL LOW (ref 38.4–49.9)
HGB: 11.4 g/dL — ABNORMAL LOW (ref 13.0–17.1)
MONO#: 0.3 10*3/uL (ref 0.1–0.9)
NEUT#: 6.5 10*3/uL (ref 1.5–6.5)
NEUT%: 71.2 % (ref 39.0–75.0)
WBC: 9.2 10*3/uL (ref 4.0–10.3)
lymph#: 2.1 10*3/uL (ref 0.9–3.3)

## 2012-10-30 NOTE — Telephone Encounter (Signed)
S/w pt wife re next appt for 12/16. Wife aware appts are monthly and pt will get new schedule when he comes in. Lb appt schedule based on 11/18 standing lab order for monthly lab appts.

## 2012-10-30 NOTE — Progress Notes (Signed)
Patient ID: [name], male   DOB: [birth year], 78 y.o.   MRN: [id number] CSN: [id number]  CC: [name]. [name] Alberts, MD [name]. [name] Motto, MD, [name], FACP, FAGA  [name] Fee, MD  [name] Pu Cornett, MD  [name] Come, MD  [name] Everts, DO  [name] Kocher, MD   Problem List: [name] is a 78 y.o. Caucasian male with a problem list consisting of:  1. 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 The patient had bone marrow on 04/03/2009 and again on 04/08/2010. FISH studies were negative as was the bone marrow. Neupogen was started in late December 2011. Cyclosporine (Gengraf) was started on August 04, 2011.  2. 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 The tumor was located in the proximal rectum first detected on colonoscopy on 09/03/2010. Laparoscopic-assisted low anterior resection with a diverting loop ileostomy was carried out on 12/23/2010. Closure of the loop ileostomy occurred on 04/06/2011. The patient is without evidence of recurrence. Colonoscopy carried out by Dr. Sheryn Bison 12/22/2011 was without significant findings.  3. Renal insufficiency 4. Hyperuricemia 5. Anemia possibly related to renal insufficiency 6. Possible seizures dating back to December 2009 7. History of C difficile diarrhea 8. Diabetes mellitus 9. Hypertension 10.Dyslipidemia 11.BPH 12.History of skin cancers 13.Status post left total hip replacement  14.Coronary artery disease 15.History of recurrent febrile neutropenia with negative cultures requiring hospital admissions.  Dr. Arline Asp and I saw [name] today for followup of his autoimmune neutropenia. We also follow 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 [name] was accompanied by his wife, [name]. He was last seen by [place] on 07/20/2012. The patient's condition over the past couple of months has remained uneventful. Specifically, blood counts have held up well on the current treatment program of Neupogen being given 300 mcg subcu 4 times weekly, on Monday, Wednesday, Friday, and Saturday.  There was some confusion today regarding the correct Cyclosporine dose for [name].  [name] stated that she received a call from [name], [place] stating that Cyclosporine dose had been reduced to 100 mg PO BID.  We could not find any documentation of this conversation and asked [name] to go back to him taking Cyclosporine 125 mg twice daily.   [name] denies any symptomatology including any symptoms to suggest recurrent colon cancer.  His bowels are without diarrhea, constipation, or blood. The patient also is being followed by Dr. Amelia [name] for probable seizures. Adjustments are being made in his medicines by Dr. Clarisse [name]. The patient reports no recent events over the past couple of months. In short, he is doing well with no new complaints.   Past Medical History: Past Medical History  Diagnosis Date  . Aortic root dilatation   . DM2 (diabetes mellitus, type 2)   . Stroke   . HLD (hyperlipidemia)   . HTN (hypertension)   . CAD (coronary artery disease)     Presumed from previously mildy abnormal myoview;  myoview 4/12: EF 73%, no ischemia  . Leukopenia   . Pneumothorax   . Rectal cancer   . Cardiomyopathy     a. echo 5/12: EF 45-50%, mild LVH, grade 2 diast dysfxn, RAE  . Colon cancer   . Chronic kidney disease  renal insuff, increased uric acid  . Seizures   . Diabetes mellitus   . BPH (benign prostatic hyperplasia)     increased PSA  . Anemia     Surgical History: Past Surgical History  Procedure Date  . Hip arthroplasty   . Tonsillectomy   . S/p resection of rectal cancer   . Vasectomy   . Ileostomy  12/2010  . Reversal ileostomy 03/2011    Current Medications: Current Outpatient Prescriptions  Medication Sig Dispense Refill  . allopurinol (ZYLOPRIM) 100 MG tablet TAKE 2 TABLETS BY MOUTH TWICE A DAY  120 tablet  3  . aspirin 81 MG tablet Take 81 mg by mouth daily.        . Cholecalciferol (VITAMIN D-3) 5000 UNITS TABS Take by mouth daily.       Marland Kitchen co-enzyme Q-10 30 MG capsule Take 30 mg by mouth 2 (two) times daily.      . cycloSPORINE modified (NEORAL) 25 MG capsule Take 125 mg by mouth 2 (two) times daily. 5 tabs twice daily = 10 tabs total daily      . doxazosin (CARDURA) 4 MG tablet Take 2 mg by mouth at bedtime.       . filgrastim (NEUPOGEN) 300 MCG/ML injection Inject 300 mcg into the skin daily. 4 times a week      . finasteride (PROSCAR) 5 MG tablet TAKE 1 TABLET BY MOUTH EVERY DAY  90 tablet  3  . fish oil-omega-3 fatty acids 1000 MG capsule Take 2 g by mouth daily.        . furosemide (LASIX) 20 MG tablet TAKE 1 TABLET (20 MG TOTAL) BY MOUTH DAILY.  30 tablet  1  . HUMULIN R 100 UNIT/ML injection CHECK 30-60 MINUTES PRIOR TO EACH MEAL  20 mL  4  . hydrochlorothiazide (HYDRODIURIL) 25 MG tablet Take 1 tablet (25 mg total) by mouth daily. NEEDS OFFICE VISIT--LAST NOTICE  90 tablet  4  . lamoTRIgine (LAMICTAL) 25 MG tablet Take 25 mg by mouth 3 (three) times daily. 2 tabs in the am, 2 tabs at noon, 4 tabs at bedtime      . metoprolol succinate (TOPROL-XL) 25 MG 24 hr tablet TAKE ONE TABLET BY MOUTH DAILY  90 tablet  1  . ONE TOUCH ULTRA TEST test strip CHECK BLOOD SUGAR 3 TIMES A DAY AS DIRECTED  100 each  4  . pravastatin (PRAVACHOL) 20 MG tablet Take 1 tablet (20 mg total) by mouth daily.  90 tablet  4  . TRUEPLUS INSULIN SYRINGE 28G X 1/2" 1 ML MISC USE AS DIRECTED  280 each  4  . oxyCODONE-acetaminophen (PERCOCET) 5-325 MG per tablet Take 2 tablets by mouth Every 4 hours as needed.      . [DISCONTINUED] pravastatin (PRAVACHOL) 20 MG tablet TAKE 1 TABLET EVERY DAY...NEEDS OFFICE  VISIT  90 tablet  3    Allergies: No Known Allergies  Family History: Family History  Problem Relation Age of Onset  . Alzheimer's disease    . Parkinsonism Other   . Parkinsonism Father   . Stomach cancer Neg Hx   . Colon cancer Neg Hx     Social History: History  Substance Use Topics  . Smoking status: Never Smoker   . Smokeless tobacco: Never Used  . Alcohol Use: 2.4 oz/week    4 Cans of beer per week    Review of Systems: 10 Point review of systems was completed and is negative except as noted  above.   Physical Exam:   Blood pressure 150/73, pulse 59, temperature 97 F (36.1 C), temperature source Oral, resp. rate 20, height 6\' 2"  (1.88 m), weight 204 lb 1.6 oz (92.579 kg).  General appearance: Alert, cooperative, well nourished, no apparent distress [name]: Normocephalic, without obvious abnormality, atraumatic Eyes: Conjunctivae/corneas clear, arcus senilis, PERRLA, EOMI Nose: Nares, septum and mucosa are normal, no drainage or sinus tenderness Neck: No adenopathy, supple, symmetrical, trachea midline, thyroid not enlarged, symmetric, no tenderness Resp: Clear to auscultation bilaterally Cardio: Regular rate and rhythm, S1, S2 normal, no murmur, click, rub or gallop GI: Soft, non-tender, distended, hypoactive bowel sounds, no organomegaly Extremities: Extremities normal, atraumatic, no cyanosis or edema, seborrheic keratosis on extremities Lymph nodes: Cervical, supraclavicular, and axillary nodes normal Neurologic: Grossly normal   Laboratory Data: Results for orders placed in visit on 10/30/12 (from the past 48 hour(s))  CBC WITH DIFFERENTIAL     Status: Abnormal   Collection Time   10/30/12  9:43 AM      Component Value Range Comment   WBC 9.2  4.0 - 10.3 10e3/uL    NEUT# 6.5  1.5 - 6.5 10e3/uL    HGB 11.4 (*) 13.0 - 17.1 g/dL    HCT 13.0 (*) 86.5 - 49.9 %    Platelets 178  140 - 400 10e3/uL    MCV 95.0  79.3 - 98.0 fL    MCH 32.6  27.2 - 33.4 pg     MCHC 34.4  32.0 - 36.0 g/dL    RBC 7.84 (*) 6.96 - 5.82 10e6/uL    RDW 14.1  11.0 - 14.6 %    lymph# 2.1  0.9 - 3.3 10e3/uL    MONO# 0.3  0.1 - 0.9 10e3/uL    Eosinophils Absolute 0.1  0.0 - 0.5 10e3/uL    Basophils Absolute 0.1  0.0 - 0.1 10e3/uL    NEUT% 71.2  39.0 - 75.0 %    LYMPH% 23.3  14.0 - 49.0 %    MONO% 3.6  0.0 - 14.0 %    EOS% 1.3  0.0 - 7.0 %    BASO% 0.6  0.0 - 2.0 %   CEA     Status: Normal (Preliminary result)   Collection Time   10/30/12  9:43 AM      Component Value Range Comment   CEA 2.2  0.0 - 5.0 ng/mL   COMPREHENSIVE METABOLIC PANEL (CC13)     Status: Abnormal   Collection Time   10/30/12  9:43 AM      Component Value Range Comment   Sodium 137  136 - 145 mEq/L    Potassium 4.7  3.5 - 5.1 mEq/L    Chloride 103  98 - 107 mEq/L    CO2 28  22 - 29 mEq/L    Glucose 147 (*) 70 - 99 mg/dl    BUN 29.5 (*) 7.0 - 28.4 mg/dL    Creatinine 1.6 (*) 0.7 - 1.3 mg/dL    Total Bilirubin 1.32  0.20 - 1.20 mg/dL    Alkaline Phosphatase 150  40 - 150 U/L    AST 19  5 - 34 U/L    ALT 20  0 - 55 U/L    Total Protein 6.8  6.4 - 8.3 g/dL    Albumin 3.9  3.5 - 5.0 g/dL    Calcium 9.5  8.4 - 44.0 mg/dL   LACTATE DEHYDROGENASE (CC13)     Status: Normal   Collection Time  10/30/12  9:43 AM      Component Value Range Comment   LDH 136  125 - 245 U/L 10/19/12 - NOTE new reference range.    Imaging Studies: 1. CT scan of the abdomen and pelvis with IV contrast on 04/29/2011 showed no acute findings within the abdomen or pelvis. There was  some atelectasis at the lung bases.  2. CT angiogram of the chest with IV contrast on 05/01/2011 showed no evidence of pulmonary embolism. There were moderate bilateral pleural effusions, left greater than right. There was a left lower lobe opacity, likely atelectasis with some right basilar atelectasis.  3. Chest x-ray, 2 view, from 12/03/2011 showed small, left greater than right pleural effusions with associated opacity: atelectasis versus  infiltrate.  4.MRI of the [name] without IV contrast on 05/11/2012 showed altered signal intensity of the clivus has progressed since the prior exam of  11/04/2010.  Impression/Plan: Mr. Gillison seems to be doing well. He is currently on Neupogen 300 mcg subcu 4 doses weekly, specifically Monday, Wednesday, Friday, and Saturday. He has had no febrile episodes. His white count has remained elevated without neutropenia. The patient is on Cyclosporine, and his dose has been clarified at length with Mrs. Virgel Bouquet (who administers his medications).  Mr. Gustaveson continues with some mild anemia which is attributed to renal insufficiency.   We will make no changes in the treatment plan. We will check a CBC along with Cyclosporine level monthly (11/27/2012, 01/01/2013 and 01/29/2013).  We plan to see Mr. Leggitt again in 4 months on 02/26/2013 at which time we will check CBC, CMP, chemistries, and a Cyclosporine level, which is carried out on the whole blood and represents a trough level.  Mr. Pitter is schedule to see Dr. Cleta [name] tomorrow and obtain the influenza vaccination.  Mr. And Mrs. Trojanowski are asked to contact us in the interim if they have any questions or concerns.    Larina Bras, NP-C 10/30/2012, 9:52 AM

## 2012-10-30 NOTE — Patient Instructions (Addendum)
Call us if you have any questions or concerns Take Cyclosporine 125 mg by mouth twice every day = 5 tabs in am, 5 tabs in the pm

## 2012-10-31 ENCOUNTER — Encounter: Payer: Self-pay | Admitting: Emergency Medicine

## 2012-10-31 ENCOUNTER — Ambulatory Visit (INDEPENDENT_AMBULATORY_CARE_PROVIDER_SITE_OTHER): Payer: Medicare Other | Admitting: Emergency Medicine

## 2012-10-31 VITALS — BP 145/68 | HR 67 | Temp 97.0°F | Resp 16 | Ht 74.5 in | Wt 206.0 lb

## 2012-10-31 DIAGNOSIS — D7289 Other specified disorders of white blood cells: Secondary | ICD-10-CM

## 2012-10-31 DIAGNOSIS — Z23 Encounter for immunization: Secondary | ICD-10-CM

## 2012-10-31 DIAGNOSIS — E119 Type 2 diabetes mellitus without complications: Secondary | ICD-10-CM

## 2012-10-31 LAB — CYCLOSPORINE LEVEL: Cyclosporine, Blood: 97 ng/mL — ABNORMAL LOW (ref 140–330)

## 2012-10-31 LAB — POCT GLYCOSYLATED HEMOGLOBIN (HGB A1C): Hemoglobin A1C: 6.4

## 2012-10-31 NOTE — Progress Notes (Signed)
  Subjective:    Patient ID: [name], male    DOB: 10/22/1932, 78 y.o.   MRN: [id number]  HPI patient enters for a checkup. He is doing well and receiving regular injections to boost his white count. His diabetes has been under good control according to his wife. He is also nail on his left foot and is trying to lose the nail on his right foot he    Review of Systems     Objective:   Physical Exam patient is alert and cooperative does not appear in any distress. His neck is supple. Chest clear cardiac unremarkable abdomen is soft nontender  Results for orders placed in visit on 10/31/12  GLUCOSE, POCT (MANUAL RESULT ENTRY)      Component Value Range   POC Glucose 168 (*) 70 - 99 mg/dl  POCT GLYCOSYLATED HEMOGLOBIN (HGB A1C)      Component Value Range   Hemoglobin A1C 6.4          Assessment & Plan:  Flu shot was given today. We'll check a hemoglobin A1c and glucose

## 2012-11-13 ENCOUNTER — Other Ambulatory Visit: Payer: Self-pay | Admitting: Emergency Medicine

## 2012-11-18 ENCOUNTER — Other Ambulatory Visit: Payer: Self-pay | Admitting: Physician Assistant

## 2012-11-23 ENCOUNTER — Telehealth: Payer: Self-pay

## 2012-11-23 NOTE — Telephone Encounter (Signed)
ERROR

## 2012-11-27 ENCOUNTER — Other Ambulatory Visit: Payer: Medicare Other

## 2012-11-27 ENCOUNTER — Other Ambulatory Visit (HOSPITAL_BASED_OUTPATIENT_CLINIC_OR_DEPARTMENT_OTHER): Payer: Medicare Other

## 2012-11-27 DIAGNOSIS — D709 Neutropenia, unspecified: Secondary | ICD-10-CM

## 2012-11-27 DIAGNOSIS — Z862 Personal history of diseases of the blood and blood-forming organs and certain disorders involving the immune mechanism: Secondary | ICD-10-CM

## 2012-11-27 DIAGNOSIS — C187 Malignant neoplasm of sigmoid colon: Secondary | ICD-10-CM

## 2012-11-27 LAB — CBC WITH DIFFERENTIAL/PLATELET
BASO%: 0.6 % (ref 0.0–2.0)
EOS%: 1.3 % (ref 0.0–7.0)
LYMPH%: 19.2 % (ref 14.0–49.0)
MCH: 30.9 pg (ref 27.2–33.4)
MCHC: 32.3 g/dL (ref 32.0–36.0)
MONO#: 0.4 10*3/uL (ref 0.1–0.9)
Platelets: 191 10*3/uL (ref 140–400)
RBC: 3.77 10*6/uL — ABNORMAL LOW (ref 4.20–5.82)
WBC: 12.9 10*3/uL — ABNORMAL HIGH (ref 4.0–10.3)

## 2012-11-28 LAB — CYCLOSPORINE LEVEL: Cyclosporine, Blood: 187 ng/mL (ref 140–330)

## 2012-12-14 ENCOUNTER — Other Ambulatory Visit: Payer: Self-pay | Admitting: Physician Assistant

## 2012-12-24 ENCOUNTER — Other Ambulatory Visit: Payer: Self-pay | Admitting: Physician Assistant

## 2012-12-25 ENCOUNTER — Other Ambulatory Visit: Payer: Medicare Other

## 2012-12-25 DIAGNOSIS — C187 Malignant neoplasm of sigmoid colon: Secondary | ICD-10-CM

## 2012-12-25 DIAGNOSIS — Z862 Personal history of diseases of the blood and blood-forming organs and certain disorders involving the immune mechanism: Secondary | ICD-10-CM

## 2012-12-25 DIAGNOSIS — D649 Anemia, unspecified: Secondary | ICD-10-CM

## 2012-12-25 LAB — CBC WITH DIFFERENTIAL/PLATELET
BASO%: 0.6 % (ref 0.0–2.0)
Basophils Absolute: 0.1 10*3/uL (ref 0.0–0.1)
EOS%: 1.3 % (ref 0.0–7.0)
HGB: 11.5 g/dL — ABNORMAL LOW (ref 13.0–17.1)
MCH: 31.7 pg (ref 27.2–33.4)
MCHC: 33.9 g/dL (ref 32.0–36.0)
MONO%: 3.9 % (ref 0.0–14.0)
RBC: 3.61 10*6/uL — ABNORMAL LOW (ref 4.20–5.82)
RDW: 14.4 % (ref 11.0–14.6)
lymph#: 2.4 10*3/uL (ref 0.9–3.3)

## 2012-12-26 LAB — CYCLOSPORINE LEVEL: Cyclosporine, Blood: 95 ng/mL — ABNORMAL LOW (ref 140–330)

## 2013-01-13 ENCOUNTER — Other Ambulatory Visit (HOSPITAL_COMMUNITY): Payer: Self-pay | Admitting: Oncology

## 2013-01-15 ENCOUNTER — Other Ambulatory Visit: Payer: Self-pay | Admitting: *Deleted

## 2013-01-17 ENCOUNTER — Other Ambulatory Visit: Payer: Self-pay | Admitting: Physician Assistant

## 2013-01-22 ENCOUNTER — Other Ambulatory Visit: Payer: Medicare Other

## 2013-01-23 ENCOUNTER — Other Ambulatory Visit: Payer: Self-pay | Admitting: Physician Assistant

## 2013-01-23 ENCOUNTER — Other Ambulatory Visit: Payer: Self-pay | Admitting: Oncology

## 2013-01-23 DIAGNOSIS — C249 Malignant neoplasm of biliary tract, unspecified: Secondary | ICD-10-CM

## 2013-01-24 ENCOUNTER — Other Ambulatory Visit (HOSPITAL_BASED_OUTPATIENT_CLINIC_OR_DEPARTMENT_OTHER): Payer: Medicare Other

## 2013-01-24 DIAGNOSIS — Z862 Personal history of diseases of the blood and blood-forming organs and certain disorders involving the immune mechanism: Secondary | ICD-10-CM

## 2013-01-24 DIAGNOSIS — C187 Malignant neoplasm of sigmoid colon: Secondary | ICD-10-CM

## 2013-01-24 DIAGNOSIS — D649 Anemia, unspecified: Secondary | ICD-10-CM

## 2013-01-24 LAB — CBC WITH DIFFERENTIAL/PLATELET
Basophils Absolute: 0.1 10*3/uL (ref 0.0–0.1)
EOS%: 1.2 % (ref 0.0–7.0)
HCT: 33.8 % — ABNORMAL LOW (ref 38.4–49.9)
HGB: 11.1 g/dL — ABNORMAL LOW (ref 13.0–17.1)
LYMPH%: 22.7 % (ref 14.0–49.0)
MCH: 31 pg (ref 27.2–33.4)
MCV: 94.2 fL (ref 79.3–98.0)
MONO%: 3.2 % (ref 0.0–14.0)
NEUT%: 72.2 % (ref 39.0–75.0)

## 2013-01-29 ENCOUNTER — Ambulatory Visit (INDEPENDENT_AMBULATORY_CARE_PROVIDER_SITE_OTHER): Payer: Medicare Other | Admitting: Surgery

## 2013-02-15 ENCOUNTER — Other Ambulatory Visit: Payer: Self-pay | Admitting: Physician Assistant

## 2013-02-19 ENCOUNTER — Ambulatory Visit: Payer: Medicare Other | Admitting: Oncology

## 2013-02-19 ENCOUNTER — Other Ambulatory Visit: Payer: Medicare Other

## 2013-02-19 ENCOUNTER — Ambulatory Visit (INDEPENDENT_AMBULATORY_CARE_PROVIDER_SITE_OTHER): Payer: Medicare Other | Admitting: Surgery

## 2013-02-20 ENCOUNTER — Ambulatory Visit: Payer: Medicare Other | Admitting: Emergency Medicine

## 2013-02-20 ENCOUNTER — Telehealth: Payer: Self-pay | Admitting: Oncology

## 2013-02-20 ENCOUNTER — Telehealth: Payer: Self-pay | Admitting: Medical Oncology

## 2013-02-20 ENCOUNTER — Other Ambulatory Visit: Payer: Self-pay | Admitting: Medical Oncology

## 2013-02-20 NOTE — Telephone Encounter (Signed)
, °

## 2013-02-20 NOTE — Telephone Encounter (Signed)
Pt' wife called and states that they so sorry they missed their appointment yesterday. She did not get the message until it was too late. I told her we will get him rescheduled and she will get a call with date and time. She voiced understanding.

## 2013-02-21 ENCOUNTER — Other Ambulatory Visit (HOSPITAL_BASED_OUTPATIENT_CLINIC_OR_DEPARTMENT_OTHER): Payer: Medicare Other | Admitting: Lab

## 2013-02-21 ENCOUNTER — Ambulatory Visit (HOSPITAL_BASED_OUTPATIENT_CLINIC_OR_DEPARTMENT_OTHER): Payer: Medicare Other | Admitting: Physician Assistant

## 2013-02-21 VITALS — BP 174/65 | HR 59 | Temp 98.2°F | Resp 20 | Ht 74.5 in | Wt 202.7 lb

## 2013-02-21 DIAGNOSIS — D72819 Decreased white blood cell count, unspecified: Secondary | ICD-10-CM

## 2013-02-21 DIAGNOSIS — Z862 Personal history of diseases of the blood and blood-forming organs and certain disorders involving the immune mechanism: Secondary | ICD-10-CM

## 2013-02-21 DIAGNOSIS — C187 Malignant neoplasm of sigmoid colon: Secondary | ICD-10-CM

## 2013-02-21 DIAGNOSIS — Z85048 Personal history of other malignant neoplasm of rectum, rectosigmoid junction, and anus: Secondary | ICD-10-CM

## 2013-02-21 DIAGNOSIS — C20 Malignant neoplasm of rectum: Secondary | ICD-10-CM

## 2013-02-21 DIAGNOSIS — D539 Nutritional anemia, unspecified: Secondary | ICD-10-CM

## 2013-02-21 DIAGNOSIS — D708 Other neutropenia: Secondary | ICD-10-CM

## 2013-02-21 DIAGNOSIS — N289 Disorder of kidney and ureter, unspecified: Secondary | ICD-10-CM

## 2013-02-21 DIAGNOSIS — D649 Anemia, unspecified: Secondary | ICD-10-CM

## 2013-02-21 LAB — COMPREHENSIVE METABOLIC PANEL (CC13)
AST: 16 U/L (ref 5–34)
Alkaline Phosphatase: 152 U/L — ABNORMAL HIGH (ref 40–150)
BUN: 27.3 mg/dL — ABNORMAL HIGH (ref 7.0–26.0)
Creatinine: 1.6 mg/dL — ABNORMAL HIGH (ref 0.7–1.3)
Glucose: 133 mg/dl — ABNORMAL HIGH (ref 70–99)
Total Bilirubin: 0.52 mg/dL (ref 0.20–1.20)

## 2013-02-21 LAB — CBC WITH DIFFERENTIAL/PLATELET
BASO%: 0.7 % (ref 0.0–2.0)
Eosinophils Absolute: 0.1 10*3/uL (ref 0.0–0.5)
LYMPH%: 24.6 % (ref 14.0–49.0)
MCHC: 33.9 g/dL (ref 32.0–36.0)
MCV: 93 fL (ref 79.3–98.0)
MONO%: 3.5 % (ref 0.0–14.0)
Platelets: 193 10*3/uL (ref 140–400)
RBC: 3.58 10*6/uL — ABNORMAL LOW (ref 4.20–5.82)

## 2013-02-21 NOTE — Progress Notes (Signed)
[facility]  [phone]   CC: [name]. [name] Alberts, MD  [name]. [name] Motto, MD, [name], FACP, FAGA  [name] Fee, MD  [name] Pu Cornett, MD  [name] Come, MD  [name] Everts, DO  [name] Kocher, MD      OFFICE PROGRESS NOTE  Problem List:  [name] is a 79 y.o. Caucasian male with a problem list consisting of:  1. 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 The patient had bone marrow on 04/03/2009 and again on 04/08/2010. FISH studies were negative as was the bone marrow. Neupogen was started in late December 2011. Cyclosporine (Gengraf) was started on August 04, 2011.  2. 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 The tumor was located in the proximal rectum first detected on colonoscopy on 09/03/2010. Laparoscopic-assisted low anterior resection with a diverting loop ileostomy was carried out on 12/23/2010. Closure of the loop ileostomy occurred on 04/06/2011. The patient is without evidence of recurrence. Colonoscopy carried out by Dr. Sheryn Bison 12/22/2011 was without significant findings.  3. Renal insufficiency  4. Hyperuricemia  5. Anemia possibly related to renal insufficiency  6. Possible seizures dating back to December 2009  7. History of C difficile diarrhea  8. Diabetes mellitus  9. Hypertension  10.Dyslipidemia  11.BPH  12.History of skin cancers  13.Status post left total hip replacement  14.Coronary artery disease  15.History of recurrent febrile neutropenia with negative cultures requiring hospital admissions.   HISTORY:  [name] today for followup of his autoimmune neutropenia. 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 [name] was accompanied by his wife, [name]. He was last seen by [place] on 10/30/12 The patient's condition  From the hematological standpoint Specifically, blood counts have held up well on the current treatment program of Neupogen being given 300 mcg subcu 4 times weekly, on Monday, Wednesday, Friday, and Saturday.He continues to be on Cyclosporine 125 mg twice daily. [name] denies any symptomatology including any symptoms to suggest recurrent colon cancer. His bowels are without diarrhea, constipation, or blood. The patient also is being followed by [name] for probable seizures. Last event was on 2/14.  Adjustments are being made in his medicines by [name]. He is due for a colonoscopy in the next couple of months, per patient report. He is also due for a yearly physical on 3/13 by his PCP. No other complaints verbalized.       MEDICATIONS:  Current Outpatient Prescriptions  Medication Sig Dispense Refill  . allopurinol (ZYLOPRIM) 100 MG tablet TAKE 2 TABLETS BY MOUTH TWICE A DAY  120 tablet  1  . aspirin 81 MG tablet Take 81 mg by mouth daily.        . Cholecalciferol (VITAMIN D-3) 5000 UNITS TABS Take by mouth daily.       Marland Kitchen co-enzyme Q-10 30 MG capsule Take 30 mg by mouth 2 (two) times daily.      . cycloSPORINE modified (NEORAL) 25 MG capsule Take 125 mg by mouth 2 (two) times daily. 5 tabs twice daily = 10 tabs total daily      . cycloSPORINE modified (NEORAL) 25 MG capsule Take five capsules by mouth twice daily  300 capsule  5  . doxazosin (  CARDURA) 4 MG tablet TAKE 1 TABLET BY MOUTH DAILY  90 tablet  0  . filgrastim (NEUPOGEN) 300 MCG/ML injection Inject 300 mcg into the skin daily. 4 times a week      . finasteride (PROSCAR) 5 MG tablet TAKE 1 TABLET BY MOUTH EVERY DAY  90 tablet  3  . fish oil-omega-3 fatty acids 1000 MG capsule Take 2 g by mouth daily.        . furosemide (LASIX) 20 MG tablet TAKE 1 TABLET (20 MG TOTAL) BY MOUTH DAILY.  30 tablet  1  .  furosemide (LASIX) 20 MG tablet TAKE 1 TABLET EVERY DAY  30 tablet  1  . HUMULIN R 100 UNIT/ML injection CHECK 30-60 MINUTES PRIOR TO EACH MEAL  20 mL  4  . hydrochlorothiazide (HYDRODIURIL) 25 MG tablet Take 1 tablet (25 mg total) by mouth daily. NEEDS OFFICE VISIT--LAST NOTICE  90 tablet  4  . lamoTRIgine (LAMICTAL) 25 MG tablet Take 25 mg by mouth 3 (three) times daily. 2 tabs in the am, 2 tabs at noon, 4 tabs at bedtime      . metoprolol succinate (TOPROL-XL) 25 MG 24 hr tablet TAKE ONE TABLET BY MOUTH DAILY  90 tablet  1  . ONE TOUCH ULTRA TEST test strip CHECK BLOOD SUGAR 3 TIMES A DAY AS DIRECTED  100 each  4  . oxyCODONE-acetaminophen (PERCOCET) 5-325 MG per tablet Take 2 tablets by mouth Every 4 hours as needed.      . pravastatin (PRAVACHOL) 20 MG tablet Take 1 tablet (20 mg total) by mouth daily.  90 tablet  4  . TRUEPLUS INSULIN SYRINGE 28G X 1/2" 1 ML MISC USE AS DIRECTED  280 each  4   No current facility-administered medications for this visit.    ALLERGIES:  No Known Allergies  SMOKING HISTORY: The patient has been a nonsmoker.      PHYSICAL EXAMINATION:   Filed Vitals:   02/21/13 1050  BP: 174/65  Pulse: 59  Temp: 98.2 F (36.8 C)  Resp: 20   Filed Weights   02/21/13 1050  Weight: 202 lb 11.2 oz (91.944 kg)    HEENT: There is no scleral icterus. Mouth and pharynx are benign. Lymph: There is no peripheral adenopathy palpable. Heart and Lungs: Normal. Abdomen: Benign with no organomegaly or masses palpable. Extremities:  traceedema of the left ankle,no  edema on the right ankle. Neurologic Exam: Normal.     LABORATORY/RADIOLOGY DATA:   Recent Labs Lab 02/21/13 1026  WBC 9.9  HGB 11.3*  HCT 33.3*  PLT 193  MCV 93.0  MCH 31.5  MCHC 33.9  RDW 13.9  LYMPHSABS 2.4  MONOABS 0.3  EOSABS 0.1  BASOSABS 0.1    CMP   No results found for this basename: NA, K, CL, CO2, GLUCOSE, BUN, CREATININE, GFRCGP, CALCIUM, MG, AST, ALT, ALKPHOS, BILITOT,  in the  last 168 hours      Component Value Date/Time   BILITOT 0.64 10/30/2012 0943   BILITOT 0.4 08/01/2012 1448       Component Value Date/Time   ESRSEDRATE 4 06/15/2011 1325    Last Cyclosporin levels on 01/24/13 were 93. Levels today pending.   Radiology Studies:  Imaging Studies:  1. CT scan of the abdomen and pelvis with IV contrast on 04/29/2011 showed no acute findings within the abdomen or pelvis. There was  some atelectasis at the lung bases.  2. CT angiogram of the chest with IV contrast on 05/01/2011  showed no evidence of pulmonary embolism. There were moderate bilateral pleural effusions, left greater than right. There was a left lower lobe opacity, likely atelectasis with some right basilar atelectasis.  3. Chest x-ray, 2 view, from 12/03/2011 showed small, left greater than right pleural effusions with associated opacity: atelectasis versus infiltrate.  4.MRI of the [name] without IV contrast on 05/11/2012 showed altered signal intensity of the clivus has progressed since the prior exam of  11/04/2010.    ASSESSMENT AND PLAN:   [name] seems to be doing well. He is currently on Neupogen 300 mcg subcu 4 doses weekly, specifically Monday, Wednesday, Friday, and Saturday. He has had no febrile episodes. His white count has remained elevated without neutropenia. The patient is on Cyclosporine, and his dose has been clarified at length with Mrs. Virgel Bouquet (who administers his medications). Mr. Schack continues with some mild anemia which is attributed to renal insufficiency. We will make no changes in theNeupogen plan. We will check a CBC  Every 2 months, and  Will  plan to see [name] again in 4 months on 02/26/2013 at which time we will check CBC, CMP, chemistries, and a Cyclosporine level,  Mr. And Mrs. Hangartner are asked to contact us in the interim if they have any questions or concerns.     [facility], PA-C 02/21/2013, 10:53 AM

## 2013-02-21 NOTE — Patient Instructions (Signed)
Check CBC and Cyclosporin levels every 2 months Follow up with Dr. Arline Asp with labs in 4 months

## 2013-02-22 ENCOUNTER — Encounter: Payer: Self-pay | Admitting: Oncology

## 2013-02-22 ENCOUNTER — Telehealth: Payer: Self-pay | Admitting: Medical Oncology

## 2013-02-22 ENCOUNTER — Other Ambulatory Visit: Payer: Self-pay | Admitting: Oncology

## 2013-02-22 DIAGNOSIS — Z862 Personal history of diseases of the blood and blood-forming organs and certain disorders involving the immune mechanism: Secondary | ICD-10-CM

## 2013-02-22 LAB — IRON AND TIBC
%SAT: 31 % (ref 20–55)
Iron: 118 ug/dL (ref 42–165)
TIBC: 375 ug/dL (ref 215–435)
UIBC: 257 ug/dL (ref 125–400)

## 2013-02-22 LAB — FERRITIN: Ferritin: 66 ng/mL (ref 22–322)

## 2013-02-22 LAB — VITAMIN B12: Vitamin B-12: 931 pg/mL — ABNORMAL HIGH (ref 211–911)

## 2013-02-22 NOTE — Progress Notes (Signed)
Ferritin was 66 on 02/21/2013 down from 225 on 08/18/2012.  This patient has received IV Feraheme in the past: 510 mg on 07/02/2010, 07/05/2011 and 07/12/2011.  On 06/15/2011 ferritin was 63. The patient has had positive stools in the past, most recently on 05/02/2011.  I have scheduled him for IV Feraheme on or about 02/28/2013. We will also check stools for occult blood.

## 2013-02-22 NOTE — Telephone Encounter (Signed)
I called pt and spoke with his wife [name] to let them know that Dr. Arline Asp is setting pt up to get IV feraheme. His ferritin has dropped from 225 ( 08/18/12) to 66 ( 02/21/13). The schedulers will call them with a date and time. I explained that Dr. Arline Asp would like for pt to do stool cards to check for bleeding. I asked her to pick these up when he comes in for his infusion. She voiced understanding.

## 2013-02-26 ENCOUNTER — Ambulatory Visit (INDEPENDENT_AMBULATORY_CARE_PROVIDER_SITE_OTHER): Payer: Medicare Other | Admitting: Surgery

## 2013-02-28 ENCOUNTER — Other Ambulatory Visit: Payer: Self-pay | Admitting: Emergency Medicine

## 2013-02-28 NOTE — Telephone Encounter (Signed)
[name] STATES HER HUSBAND ONLY HAVE 4 NEEDLES LEFT AND THE PHARMACY HAVE BEEN TRYING TO GET IT REFILLED FOR THEM AND HASN'T HEARD FROM [place] PLEASE CALL [phone]     [facility] ON [address]

## 2013-02-28 NOTE — Telephone Encounter (Signed)
Chart pulled at nurses station pa pool [phone]

## 2013-03-02 ENCOUNTER — Encounter (INDEPENDENT_AMBULATORY_CARE_PROVIDER_SITE_OTHER): Payer: Self-pay | Admitting: Surgery

## 2013-03-02 ENCOUNTER — Ambulatory Visit (INDEPENDENT_AMBULATORY_CARE_PROVIDER_SITE_OTHER): Payer: Medicare Other | Admitting: Surgery

## 2013-03-02 VITALS — BP 130/66 | HR 71 | Temp 98.4°F | Resp 18 | Ht 75.0 in | Wt 204.8 lb

## 2013-03-02 DIAGNOSIS — Z85048 Personal history of other malignant neoplasm of rectum, rectosigmoid junction, and anus: Secondary | ICD-10-CM

## 2013-03-02 NOTE — Patient Instructions (Signed)
Return 1 year. 

## 2013-03-02 NOTE — Progress Notes (Signed)
Subjective:     Patient ID: [name], male   DOB: Aug 21, 1932, 79 y.o.   MRN: [id number]  HPI  The patient returns to clinic today to 2 history of rectal cancer stage I treated with laparoscopic low anterior resection with diverting loop ileostomy with closure of this in May 2012. He has chronic neutropenia. He is followed closely by Dr. Arline Asp  Review of Systems  Constitutional: Positive for fatigue.  HENT: Negative.   Eyes: Negative.   Respiratory: Negative.   Cardiovascular: Negative.   Gastrointestinal: Negative.        Objective:   Physical Exam  Constitutional: He appears well-developed and well-nourished.  HENT:  Head: Normocephalic and atraumatic.  Cardiovascular: Normal rate and regular rhythm.   Pulmonary/Chest: Effort normal and breath sounds normal.  Abdominal:  Soft non tender no hernia  Colonoscopy 2012 no evidence of recurrent disease.     Assessment:     Stage 1 rectal cancer history.    Plan:     Return in 1 year.  He is doing well. Dr Arline Asp is following.

## 2013-03-05 ENCOUNTER — Other Ambulatory Visit: Payer: Self-pay | Admitting: Oncology

## 2013-03-06 ENCOUNTER — Other Ambulatory Visit: Payer: Self-pay | Admitting: Oncology

## 2013-03-12 ENCOUNTER — Other Ambulatory Visit: Payer: Self-pay | Admitting: Physician Assistant

## 2013-03-13 ENCOUNTER — Other Ambulatory Visit (HOSPITAL_COMMUNITY): Payer: Self-pay | Admitting: Oncology

## 2013-03-22 ENCOUNTER — Other Ambulatory Visit: Payer: Self-pay | Admitting: Physician Assistant

## 2013-03-22 ENCOUNTER — Other Ambulatory Visit (HOSPITAL_COMMUNITY): Payer: Self-pay | Admitting: Oncology

## 2013-03-23 ENCOUNTER — Telehealth: Payer: Self-pay | Admitting: Oncology

## 2013-03-23 ENCOUNTER — Telehealth: Payer: Self-pay

## 2013-03-23 ENCOUNTER — Other Ambulatory Visit: Payer: Self-pay | Admitting: Oncology

## 2013-03-23 ENCOUNTER — Telehealth: Payer: Self-pay | Admitting: *Deleted

## 2013-03-23 NOTE — Telephone Encounter (Signed)
Added appts for 5/8 and 7/3 per 3/12 pof. pof did not cross over to scheduling. appts were added today per desk nurse. pof attached to flowsheet in chart. Pt already on schedule for inf 4/15. Confirmed inf and informed pt about appts for 5/8 and 7/3. Pt to get schedule when he comes in on 4/15.

## 2013-03-23 NOTE — Telephone Encounter (Signed)
Per desk RN and POF I have scheduled aspapts.   JMW

## 2013-03-23 NOTE — Telephone Encounter (Signed)
S/w shirley that pt needs feraheme, it is scheduled for 4/15 at 2 pm. She wrote it in her calendar. She also wrote in calendar to get schedule for next MD appt.

## 2013-03-26 ENCOUNTER — Other Ambulatory Visit: Payer: Self-pay | Admitting: Medical Oncology

## 2013-03-27 ENCOUNTER — Telehealth: Payer: Self-pay

## 2013-03-27 ENCOUNTER — Ambulatory Visit: Payer: Medicare Other

## 2013-03-27 ENCOUNTER — Telehealth: Payer: Self-pay | Admitting: *Deleted

## 2013-03-27 NOTE — Telephone Encounter (Signed)
Wife called, she has diarrhea and cannot bring pt for feraheme. Marcelino Duster called to reschedule pt.

## 2013-03-27 NOTE — Telephone Encounter (Signed)
Per voicemail from desk RN I have rescheduled appt from today to Thrusday. I have called patient .  JMW

## 2013-03-30 ENCOUNTER — Ambulatory Visit: Payer: Medicare Other

## 2013-04-13 ENCOUNTER — Other Ambulatory Visit: Payer: Self-pay | Admitting: Physician Assistant

## 2013-04-14 ENCOUNTER — Telehealth: Payer: Self-pay

## 2013-04-14 NOTE — Telephone Encounter (Signed)
Message from answering service:   Pt called to request testing supplies: Touch 1 test strips  Ph: [phone]  bf

## 2013-04-14 NOTE — Telephone Encounter (Signed)
Ok to send testing supplies

## 2013-04-14 NOTE — Telephone Encounter (Signed)
Can we send in? 

## 2013-04-15 NOTE — Telephone Encounter (Signed)
Called in test strips to [facility]. Wife notified.

## 2013-04-18 ENCOUNTER — Other Ambulatory Visit: Payer: Self-pay

## 2013-04-18 DIAGNOSIS — Z862 Personal history of diseases of the blood and blood-forming organs and certain disorders involving the immune mechanism: Secondary | ICD-10-CM

## 2013-04-18 DIAGNOSIS — C187 Malignant neoplasm of sigmoid colon: Secondary | ICD-10-CM

## 2013-04-19 ENCOUNTER — Other Ambulatory Visit (HOSPITAL_BASED_OUTPATIENT_CLINIC_OR_DEPARTMENT_OTHER): Payer: Medicare Other | Admitting: Lab

## 2013-04-19 ENCOUNTER — Other Ambulatory Visit: Payer: Self-pay | Admitting: Oncology

## 2013-04-19 DIAGNOSIS — C187 Malignant neoplasm of sigmoid colon: Secondary | ICD-10-CM

## 2013-04-19 DIAGNOSIS — Z862 Personal history of diseases of the blood and blood-forming organs and certain disorders involving the immune mechanism: Secondary | ICD-10-CM

## 2013-04-19 LAB — CBC WITH DIFFERENTIAL/PLATELET
BASO%: 0.2 % (ref 0.0–2.0)
Basophils Absolute: 0.1 10*3/uL (ref 0.0–0.1)
Eosinophils Absolute: 0.2 10*3/uL (ref 0.0–0.5)
HCT: 33.2 % — ABNORMAL LOW (ref 38.4–49.9)
HGB: 10.4 g/dL — ABNORMAL LOW (ref 13.0–17.1)
MCHC: 31.3 g/dL — ABNORMAL LOW (ref 32.0–36.0)
MONO#: 0.5 10*3/uL (ref 0.1–0.9)
NEUT#: 34.8 10*3/uL — ABNORMAL HIGH (ref 1.5–6.5)
NEUT%: 88.5 % — ABNORMAL HIGH (ref 39.0–75.0)
WBC: 39.4 10*3/uL — ABNORMAL HIGH (ref 4.0–10.3)
lymph#: 3.8 10*3/uL — ABNORMAL HIGH (ref 0.9–3.3)

## 2013-04-19 LAB — FERRITIN: Ferritin: 87 ng/mL (ref 22–322)

## 2013-04-20 ENCOUNTER — Telehealth: Payer: Self-pay

## 2013-04-20 ENCOUNTER — Telehealth: Payer: Self-pay | Admitting: Oncology

## 2013-04-20 NOTE — Telephone Encounter (Signed)
S/w wife: no change in the neupogen dose, next appt is for labs on 05/17/13.

## 2013-04-20 NOTE — Telephone Encounter (Signed)
sw.. pt wife and advised on 6.5.14 lab only appt....ok and aware

## 2013-04-20 NOTE — Telephone Encounter (Signed)
S/w wife that we need lab in 1 month and do not need iron infusion next week. She mentioned that he got black bruises on arm and hands on Mon and felt real bad and weak. He has slowly felt better through the week. Will discuss with Dr Arline Asp and call pt back.

## 2013-04-25 ENCOUNTER — Other Ambulatory Visit (HOSPITAL_COMMUNITY): Payer: Self-pay | Admitting: Oncology

## 2013-04-25 DIAGNOSIS — C187 Malignant neoplasm of sigmoid colon: Secondary | ICD-10-CM

## 2013-04-27 ENCOUNTER — Encounter: Payer: Self-pay | Admitting: *Deleted

## 2013-04-27 ENCOUNTER — Ambulatory Visit: Payer: Medicare Other

## 2013-04-27 ENCOUNTER — Other Ambulatory Visit: Payer: Self-pay | Admitting: Medical Oncology

## 2013-04-27 MED ORDER — FILGRASTIM 300 MCG/ML IJ SOLN: INTRAMUSCULAR | Status: DC

## 2013-04-27 NOTE — Progress Notes (Signed)
RECEIVED A FAX FROM [facility] CONCERNING A PRIOR AUTHORIZATION FOR NEUPOGEN. THIS REQUEST WAS PLACED IN THE MANAGED CARE BIN.

## 2013-04-30 ENCOUNTER — Encounter: Payer: Self-pay | Admitting: Oncology

## 2013-04-30 NOTE — Progress Notes (Signed)
[facility], [id number], approved neupogen from 04/27/13-04/27/14 [id number].

## 2013-05-01 ENCOUNTER — Other Ambulatory Visit: Payer: Self-pay | Admitting: Physician Assistant

## 2013-05-14 ENCOUNTER — Other Ambulatory Visit (HOSPITAL_COMMUNITY): Payer: Self-pay | Admitting: Oncology

## 2013-05-14 ENCOUNTER — Other Ambulatory Visit: Payer: Self-pay | Admitting: Emergency Medicine

## 2013-05-14 DIAGNOSIS — C187 Malignant neoplasm of sigmoid colon: Secondary | ICD-10-CM

## 2013-05-17 ENCOUNTER — Other Ambulatory Visit (HOSPITAL_BASED_OUTPATIENT_CLINIC_OR_DEPARTMENT_OTHER): Payer: Medicare Other

## 2013-05-17 DIAGNOSIS — Z862 Personal history of diseases of the blood and blood-forming organs and certain disorders involving the immune mechanism: Secondary | ICD-10-CM

## 2013-05-17 DIAGNOSIS — D6489 Other specified anemias: Secondary | ICD-10-CM

## 2013-05-17 LAB — CBC WITH DIFFERENTIAL/PLATELET
BASO%: 0.2 % (ref 0.0–2.0)
EOS%: 0.6 % (ref 0.0–7.0)
HCT: 33.8 % — ABNORMAL LOW (ref 38.4–49.9)
LYMPH%: 10.6 % — ABNORMAL LOW (ref 14.0–49.0)
MCH: 30.6 pg (ref 27.2–33.4)
MCHC: 32.5 g/dL (ref 32.0–36.0)
NEUT%: 87.6 % — ABNORMAL HIGH (ref 39.0–75.0)
Platelets: 212 10*3/uL (ref 140–400)
lymph#: 3.8 10*3/uL — ABNORMAL HIGH (ref 0.9–3.3)

## 2013-05-27 ENCOUNTER — Other Ambulatory Visit (HOSPITAL_COMMUNITY): Payer: Self-pay | Admitting: Oncology

## 2013-05-30 ENCOUNTER — Ambulatory Visit (INDEPENDENT_AMBULATORY_CARE_PROVIDER_SITE_OTHER): Payer: Medicare Other | Admitting: Cardiology

## 2013-05-30 ENCOUNTER — Encounter: Payer: Self-pay | Admitting: Cardiology

## 2013-05-30 VITALS — BP 168/70 | HR 73 | Wt 204.0 lb

## 2013-05-30 DIAGNOSIS — E785 Hyperlipidemia, unspecified: Secondary | ICD-10-CM

## 2013-05-30 DIAGNOSIS — I429 Cardiomyopathy, unspecified: Secondary | ICD-10-CM

## 2013-05-30 DIAGNOSIS — I428 Other cardiomyopathies: Secondary | ICD-10-CM

## 2013-05-30 DIAGNOSIS — I2510 Atherosclerotic heart disease of native coronary artery without angina pectoris: Secondary | ICD-10-CM

## 2013-05-30 DIAGNOSIS — I10 Essential (primary) hypertension: Secondary | ICD-10-CM

## 2013-05-30 NOTE — Progress Notes (Signed)
HPI: Pleasant male with PMH of presumed CAD based upon an abnormal stress test in the past as well as diabetes, hypertension, hyperlipidemia, prior stroke, aortic root dilatation and rectal carcinoma for fu. Most recent myoview in April of 2012 showed an ejection fraction of 73% and no ischemia. Patient had an echocardiogram during an admission in May of 2012 for sepsis. His ejection fraction was 45-50%. 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 FU echocardiogram was performed in October 2012 and showed normal LV function with mild left atrial enlargement; mildly dilated aortic root. I last saw him in June of 2013. Since then, the patient denies any dyspnea on exertion, orthopnea, PND, palpitations, syncope or chest pain. Mild pedal edema.   Current Outpatient Prescriptions  Medication Sig Dispense Refill  . allopurinol (ZYLOPRIM) 100 MG tablet TAKE 2 TABLETS BY MOUTH TWICE A DAY  120 tablet  1  . aspirin 81 MG tablet Take 81 mg by mouth daily.        . B-D INS SYR MICROFINE 1CC/28G 28G X 1/2" 1 ML MISC USE AS DIRECTED  100 each  8  . Cholecalciferol (VITAMIN D-3) 5000 UNITS TABS Take by mouth daily.       Marland Kitchen co-enzyme Q-10 30 MG capsule Take 30 mg by mouth 2 (two) times daily.      . cycloSPORINE modified (NEORAL) 25 MG capsule Take 125 mg by mouth 2 (two) times daily. 5 tabs twice daily = 10 tabs total daily      . doxazosin (CARDURA) 4 MG tablet TAKE 1 TABLET BY MOUTH DAILY  90 tablet  0  . filgrastim (NEUPOGEN) 300 MCG/ML injection 4 times a week or as directed  30 mL  3  . finasteride (PROSCAR) 5 MG tablet TAKE 1 TABLET BY MOUTH EVERY DAY  90 tablet  3  . fish oil-omega-3 fatty acids 1000 MG capsule Take 2 g by mouth daily.        . furosemide (LASIX) 20 MG tablet TAKE 1 TABLET (20 MG TOTAL) BY MOUTH DAILY.  30 tablet  1  . furosemide (LASIX) 20 MG tablet Take 1 tablet (20 mg total) by mouth daily. PATIENT NEEDS OFFICE VISIT FOR ADDITIONAL REFILLS  30 tablet  0  . HUMULIN R  100 UNIT/ML injection CHECK 30-60 MINUTES PRIOR TO EACH MEAL  20 mL  4  . lamoTRIgine (LAMICTAL) 25 MG tablet Take 25 mg by mouth 3 (three) times daily. 2 tabs in the am, 2 tabs at noon, 4 tabs at bedtime      . NEUPOGEN 300 MCG/0.5ML SOLN injection INJECT 1 SYRINGE ONCE DAILY  30 Syringe  6  . ONE TOUCH ULTRA TEST test strip CHECK BLOOD SUGAR 3 TIMES A DAY AS DIRECTED  100 each  8  . ONE TOUCH ULTRA TEST test strip CHECK BLOOD SUGAR 3 TIMES A DAY AS DIRECTED  100 each  1  . pravastatin (PRAVACHOL) 20 MG tablet Take 1 tablet (20 mg total) by mouth daily.  90 tablet  4   No current facility-administered medications for this visit.     Past Medical History  Diagnosis Date  . Aortic root dilatation   . DM2 (diabetes mellitus, type 2)   . Stroke   . HLD (hyperlipidemia)   . HTN (hypertension)   . CAD (coronary artery disease)     Presumed from previously mildy abnormal myoview;  myoview 4/12: EF 73%, no ischemia  . Leukopenia   . Pneumothorax   .  Rectal cancer   . Cardiomyopathy     a. echo 5/12: EF 45-50%, mild LVH, grade 2 diast dysfxn, RAE  . Colon cancer   . Chronic kidney disease     renal insuff, increased uric acid  . Seizures   . Diabetes mellitus   . BPH (benign prostatic hyperplasia)     increased PSA  . Anemia     Past Surgical History  Procedure Laterality Date  . Hip arthroplasty    . Tonsillectomy    . S/p resection of rectal cancer    . Vasectomy    . Ileostomy  12/2010  . Reversal ileostomy  03/2011    History   Social History  . Marital Status: Married    Spouse Name: N/A    Number of Children: N/A  . Years of Education: N/A   Occupational History  . retired    Social History Main Topics  . Smoking status: Never Smoker   . Smokeless tobacco: Never Used  . Alcohol Use: 2.4 oz/week    4 Cans of beer per week  . Drug Use: No  . Sexually Active: Not on file   Other Topics Concern  . Not on file   Social History Narrative  . No narrative on  file    ROS: no fevers or chills, productive cough, hemoptysis, dysphasia, odynophagia, melena, hematochezia, dysuria, hematuria, rash, seizure activity, orthopnea, PND, pedal edema, claudication. Remaining systems are negative.  Physical Exam: Well-developed well-nourished in no acute distress.  Skin is warm and dry.  HEENT is normal.  Neck is supple.  Chest is clear to auscultation with normal expansion.  Cardiovascular exam is regular rate and rhythm.  Abdominal exam nontender or distended. No masses palpated. Extremities show trace edema. neuro grossly intact  ECG sinus rhythm at a rate of 73. No ST changes.

## 2013-05-30 NOTE — Assessment & Plan Note (Signed)
Continue statin. 

## 2013-05-30 NOTE — Assessment & Plan Note (Signed)
Continue aspirin and statin. 

## 2013-05-30 NOTE — Patient Instructions (Addendum)
Your physician wants you to follow-up in: ONE YEAR WITH DR CRENSHAW You will receive a reminder letter in the mail two months in advance. If you don't receive a letter, please call our office to schedule the follow-up appointment.  

## 2013-05-30 NOTE — Assessment & Plan Note (Signed)
Continue present blood pressure medications. His blood pressure is mildly elevated but they follow this at home and it is typically controlled.

## 2013-05-30 NOTE — Assessment & Plan Note (Signed)
Improved on most recent echocardiogram. 

## 2013-06-14 ENCOUNTER — Other Ambulatory Visit (HOSPITAL_BASED_OUTPATIENT_CLINIC_OR_DEPARTMENT_OTHER): Payer: Medicare Other | Admitting: Lab

## 2013-06-14 ENCOUNTER — Encounter: Payer: Self-pay | Admitting: Oncology

## 2013-06-14 ENCOUNTER — Ambulatory Visit (HOSPITAL_BASED_OUTPATIENT_CLINIC_OR_DEPARTMENT_OTHER): Payer: Medicare Other | Admitting: Oncology

## 2013-06-14 ENCOUNTER — Telehealth: Payer: Self-pay | Admitting: *Deleted

## 2013-06-14 VITALS — BP 168/80 | HR 71 | Temp 96.9°F | Resp 20 | Ht 75.0 in | Wt 211.7 lb

## 2013-06-14 DIAGNOSIS — D72819 Decreased white blood cell count, unspecified: Secondary | ICD-10-CM

## 2013-06-14 DIAGNOSIS — C187 Malignant neoplasm of sigmoid colon: Secondary | ICD-10-CM

## 2013-06-14 DIAGNOSIS — D708 Other neutropenia: Secondary | ICD-10-CM

## 2013-06-14 DIAGNOSIS — Z85048 Personal history of other malignant neoplasm of rectum, rectosigmoid junction, and anus: Secondary | ICD-10-CM

## 2013-06-14 DIAGNOSIS — Z862 Personal history of diseases of the blood and blood-forming organs and certain disorders involving the immune mechanism: Secondary | ICD-10-CM

## 2013-06-14 DIAGNOSIS — D539 Nutritional anemia, unspecified: Secondary | ICD-10-CM

## 2013-06-14 LAB — COMPREHENSIVE METABOLIC PANEL (CC13)
ALT: 11 U/L (ref 0–55)
Albumin: 3.8 g/dL (ref 3.5–5.0)
CO2: 27 mEq/L (ref 22–29)
Calcium: 9.2 mg/dL (ref 8.4–10.4)
Chloride: 103 mEq/L (ref 98–109)
Sodium: 136 mEq/L (ref 136–145)
Total Protein: 7.1 g/dL (ref 6.4–8.3)

## 2013-06-14 LAB — CBC WITH DIFFERENTIAL/PLATELET
Basophils Absolute: 0.1 10*3/uL (ref 0.0–0.1)
Eosinophils Absolute: 0.2 10*3/uL (ref 0.0–0.5)
HCT: 33.4 % — ABNORMAL LOW (ref 38.4–49.9)
HGB: 11.1 g/dL — ABNORMAL LOW (ref 13.0–17.1)
LYMPH%: 9.1 % — ABNORMAL LOW (ref 14.0–49.0)
MONO#: 0.4 10*3/uL (ref 0.1–0.9)
NEUT#: 35 10*3/uL — ABNORMAL HIGH (ref 1.5–6.5)
NEUT%: 89.2 % — ABNORMAL HIGH (ref 39.0–75.0)
Platelets: 220 10*3/uL (ref 140–400)
RBC: 3.53 10*6/uL — ABNORMAL LOW (ref 4.20–5.82)
WBC: 39.2 10*3/uL — ABNORMAL HIGH (ref 4.0–10.3)

## 2013-06-14 LAB — LACTATE DEHYDROGENASE (CC13): LDH: 193 U/L (ref 125–245)

## 2013-06-14 NOTE — Progress Notes (Signed)
This office note has been dictated.  #[id number]

## 2013-06-14 NOTE — Telephone Encounter (Signed)
appts made and printed...td 

## 2013-06-15 NOTE — Progress Notes (Signed)
CC:   Stan Head. Cleta Alberts, M.D. Vania Rea. Jarold Motto, MD, [place], FACP, FAGA Rachael Fee, MD Clovis Pu Cornett, M.D. Simonne Come, MD Laren Everts, DO Rene Kocher, M.D.  PROBLEM LIST:  1. 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 The patient  had bone marrow on 04/03/2009 and again on 04/08/2010. FISH  studies were negative as was the bone marrow. Neupogen was started in late  December 2011. Cyclosporine (Gengraf) was started on August 04, 2011.   2. 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 The tumor was located in the proximal rectum first  detected on colonoscopy on 09/03/2010. Laparoscopic-assisted low  anterior resection with a diverting loop ileostomy was carried out  on 12/23/2010. Closure of the loop ileostomy occurred on  04/06/2011. The patient is without evidence of recurrence.  Colonoscopy carried out by Dr. Sheryn Bison 12/22/2011 was  without significant findings.  3. Renal insufficiency.  4. Hyperuricemia.  5. Anemia possibly related to renal insufficiency.  6. Possible seizures dating back to December 2009.  7. History of C difficile diarrhea.  8. Diabetes mellitus.  9. Hypertension.  10. Dyslipidemia.  11. BPH.  12. History of skin cancers.  13. Status post left total hip replacement.  14. Coronary artery disease.  15. History of recurrent febrile neutropenia with negative cultures  requiring hospital admissions.   MEDICATIONS:  Reviewed and recorded. Current Outpatient Prescriptions  Medication Sig Dispense Refill  . allopurinol (ZYLOPRIM) 100 MG tablet TAKE 2 TABLETS BY MOUTH TWICE A DAY  120 tablet  1  . aspirin 81 MG tablet Take 81 mg by mouth daily.        . B-D INS SYR MICROFINE 1CC/28G 28G X 1/2" 1 ML MISC USE AS DIRECTED  100 each  8  . Cholecalciferol (VITAMIN D-3) 5000  UNITS TABS Take by mouth daily.       Marland Kitchen co-enzyme Q-10 30 MG capsule Take 30 mg by mouth 2 (two) times daily.      . cycloSPORINE modified (NEORAL) 25 MG capsule Take 125 mg by mouth 2 (two) times daily. 5 tabs twice daily = 10 tabs total daily      . doxazosin (CARDURA) 4 MG tablet TAKE 1 TABLET BY MOUTH DAILY  90 tablet  0  . filgrastim (NEUPOGEN) 300 MCG/ML injection 4 times a week or as directed  30 mL  3  . finasteride (PROSCAR) 5 MG tablet TAKE 1 TABLET BY MOUTH EVERY DAY  90 tablet  3  . fish oil-omega-3 fatty acids 1000 MG capsule Take 2 g by mouth daily.        . furosemide (LASIX) 20 MG tablet TAKE 1 TABLET (20 MG TOTAL) BY MOUTH DAILY.  30 tablet  1  . furosemide (LASIX) 20 MG tablet Take 1 tablet (20 mg total) by mouth daily. PATIENT NEEDS OFFICE VISIT FOR ADDITIONAL REFILLS  30 tablet  0  . HUMULIN R 100 UNIT/ML injection CHECK 30-60 MINUTES PRIOR TO EACH MEAL  20 mL  4  . lamoTRIgine (LAMICTAL) 25 MG tablet Take 25 mg by mouth 3 (three) times daily. 2 tabs in the am, 2 tabs at noon, 4 tabs at bedtime      . ONE TOUCH ULTRA TEST test strip CHECK BLOOD SUGAR 3 TIMES A DAY AS DIRECTED  100 each  8  . pravastatin (PRAVACHOL) 20 MG tablet  Take 1 tablet (20 mg total) by mouth daily.  90 tablet  4   No current facility-administered medications for this visit.     TREATMENT PROGRAM: 1. Neupogen 300 mcg subcu, currently 4 times a week.  Neupogen was     started in late December 2011.  As of 06/14/2013, Neupogen dose     will be 300 mcg subcu 3 times weekly. 2. Cyclosporine 125 mg twice daily.  Cyclosporine was started in     August 2012.  SMOKING HISTORY:  The patient has been a nonsmoker.   HISTORY:  Hulbert Branscome was seen today for followup of his autoimmune neutropenia.  We also follow Mr. 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  Definitive surgery was carried out on 12/23/2010.  [name] was  accompanied by his wife, [name].  He was last seen by [place] on 02/21/2013, and prior to that, 10/30/2012, and 07/20/2012.  The patient, in general, has done quite well on the current treatment program for his autoimmune neutropenia.  We have been checking his CBCs on a monthly basis.  He has not been neutropenic for some time.  His white count was essentially in the normal range for many months up until May, when for the last 2 months, his white count has been around 40,000. The patient has not had any recent neutropenic fevers.  In the past, he had required admission to the hospital.  Cultures on each of these occasions were negative.  The patient continues to have a somewhat unusual seizure disorder.  His last seizures were about a month ago.  They are almost always occur during sleep.  Medicines apparently have been adjusted.  The patient last underwent a colonoscopy on 09/03/2010.  He denies any evidence of blood in his stools or melena.  In general, he feels well and is without complaints.  His wife states that he is quite inactive, does not seem to have much energy.  PHYSICAL EXAMINATION:  The patient looks well.  No obvious changes. Weight is 211 pounds 11.2 ounces, height 63 inches, body surface area 2.25 sq m.  Blood pressure 168/80.  The patient was informed of his elevated blood pressure.  Other vital signs are normal.  There is no scleral icterus.  Mouth and pharynx are benign.  There is no peripheral adenopathy palpable.  Heart and lungs are normal.  Abdomen:  Benign with no organomegaly or masses palpable.  Extremities:  He has 1+ edema of the left ankle.  Neurologic exam:  Grossly normal.  He does not have a Port-A-Cath or central catheter.  LABORATORY DATA:  White count 39.2, ANC 35.0, hemoglobin 11.1, hematocrit 33.4, platelets 220,000.  Chemistries today notable for a BUN of 32, creatinine 1.6, albumin 3.8.  LDH 193.  Ferritin was 120 and iron saturation 26.  Vitamin  B12 level on 02/21/2013, was 931.  Cyclosporine level on 01/24/2013, was 93.  There is a trough level, usually drawn 1st thing in the morning.  CEA on 02/21/2013, was 1.6.  IMAGING STUDIES:  1. CT scan of the abdomen and pelvis with IV contrast on 04/29/2011  showed no acute findings within the abdomen or pelvis. There was  some atelectasis at the lung bases.  2. CT angiogram of the chest with IV contrast on 05/01/2011 showed no  evidence of pulmonary embolism. There were moderate bilateral  pleural effusions, left greater than right. There was a left lower  lobe opacity, likely atelectasis with some right basilar  atelectasis.  3. Chest x-ray, 2 view, from 12/03/2011 showed small, left greater  than right pleural effusions with associated opacity: atelectasis  versus infiltrate.  4.MRI of the head without IV contrast on 05/11/2012 showed altered signal  intensity of the clivus has progressed since the prior exam of  11/04/2010.   IMPRESSION AND PLAN:  Mr. Olenik seems to be doing well.  His current dose of Neupogen is 300 mcg subcutaneous 4 days weekly given on Mondays, Wednesdays, Fridays, and Saturdays.  Of note is that in recent months, the patient's white count has been running around 40,000.  We will try to cut back somewhat on the Neupogen to 300 mcg subcu Mondays, Wednesdays, and Fridays.  In the past, when we have tried to go to 3 times a week, the patient has developed severe neutropenia, sometimes accompanied by fever which in the past has required admission.  The patient, in general, seems to be doing quite well  on the current program.  We will continue the cyclosporine at the same dose 125 mg twice a day.  We will continue to check CBCs on a monthly basis.  I have asked the patient to return in 4 months, at which time we will check CBC, chemistries, LDH, CEA, and a trough cyclosporine level.  We will also check iron studies as the patient has been iron deficient in  the past and has required intravenous iron.    ______________________________ Samul Dada, M.D. DSM/MEDQ  D:  06/14/2013  T:  06/15/2013  Job:  [id number]

## 2013-06-18 ENCOUNTER — Other Ambulatory Visit: Payer: Self-pay | Admitting: Emergency Medicine

## 2013-06-18 NOTE — Telephone Encounter (Signed)
Refill encounter ?

## 2013-07-05 ENCOUNTER — Other Ambulatory Visit: Payer: Self-pay | Admitting: Physician Assistant

## 2013-07-13 ENCOUNTER — Telehealth: Payer: Self-pay | Admitting: Cardiology

## 2013-07-13 MED ORDER — AMLODIPINE BESYLATE 5 MG PO TABS: 5.0000 mg | ORAL_TABLET | Freq: Every day | ORAL | Status: DC

## 2013-07-13 NOTE — Telephone Encounter (Signed)
New Prob     Pts BP keeps bouncing around and would like to speak to nurse regarding this. Please call.

## 2013-07-13 NOTE — Telephone Encounter (Signed)
Spoke with pt wife, the pts bp has been ranging from 191-140/100-80. We ask for the pt to track and let us know. Will forward for dr Jens Som review

## 2013-07-13 NOTE — Telephone Encounter (Signed)
Spoke with pt wife, Aware of dr crenshaw's recommendations.  

## 2013-07-13 NOTE — Telephone Encounter (Signed)
norvasc 5 mg po daily Olga Millers

## 2013-07-16 ENCOUNTER — Other Ambulatory Visit (HOSPITAL_BASED_OUTPATIENT_CLINIC_OR_DEPARTMENT_OTHER): Payer: Medicare Other | Admitting: Lab

## 2013-07-16 DIAGNOSIS — D708 Other neutropenia: Secondary | ICD-10-CM

## 2013-07-16 DIAGNOSIS — Z85048 Personal history of other malignant neoplasm of rectum, rectosigmoid junction, and anus: Secondary | ICD-10-CM

## 2013-07-16 LAB — CBC WITH DIFFERENTIAL/PLATELET
Basophils Absolute: 0 10*3/uL (ref 0.0–0.1)
EOS%: 1.8 % (ref 0.0–7.0)
HCT: 32.9 % — ABNORMAL LOW (ref 38.4–49.9)
HGB: 11.1 g/dL — ABNORMAL LOW (ref 13.0–17.1)
MCH: 31.7 pg (ref 27.2–33.4)
MCV: 93.6 fL (ref 79.3–98.0)
MONO%: 4.1 % (ref 0.0–14.0)
NEUT%: 65.3 % (ref 39.0–75.0)
lymph#: 2.1 10*3/uL (ref 0.9–3.3)

## 2013-07-17 ENCOUNTER — Other Ambulatory Visit: Payer: Self-pay

## 2013-07-17 DIAGNOSIS — D72819 Decreased white blood cell count, unspecified: Secondary | ICD-10-CM

## 2013-07-17 NOTE — Progress Notes (Signed)
Spoke with Mrs. Isaiyah and told her that Dr. Truett Perna said to check cbc/diff in one week to see if cbc is trending too low with neupogen 300 mcg three times a week vs 4 times a week as per Dr. Mamie Levers A/P in office note 06-14-13.  Ms. Kino verbalized understanding.

## 2013-07-24 ENCOUNTER — Other Ambulatory Visit: Payer: Medicare Other | Admitting: Lab

## 2013-07-24 ENCOUNTER — Telehealth: Payer: Self-pay | Admitting: Hematology and Oncology

## 2013-07-24 NOTE — Telephone Encounter (Signed)
pt wife called and needed to r/s lab to tomorrow b.c. she has bladder problem.Marland KitchenMarland KitchenMarland KitchenDone

## 2013-07-25 ENCOUNTER — Other Ambulatory Visit (HOSPITAL_BASED_OUTPATIENT_CLINIC_OR_DEPARTMENT_OTHER): Payer: Medicare Other

## 2013-07-25 DIAGNOSIS — D72819 Decreased white blood cell count, unspecified: Secondary | ICD-10-CM

## 2013-07-25 LAB — CBC WITH DIFFERENTIAL/PLATELET
Basophils Absolute: 0 10*3/uL (ref 0.0–0.1)
EOS%: 1.5 % (ref 0.0–7.0)
HGB: 11.2 g/dL — ABNORMAL LOW (ref 13.0–17.1)
LYMPH%: 29.1 % (ref 14.0–49.0)
MCH: 31.6 pg (ref 27.2–33.4)
MCV: 93.2 fL (ref 79.3–98.0)
MONO%: 5.1 % (ref 0.0–14.0)
Platelets: 192 10*3/uL (ref 140–400)
RDW: 14.2 % (ref 11.0–14.6)

## 2013-07-29 ENCOUNTER — Other Ambulatory Visit (HOSPITAL_COMMUNITY): Payer: Self-pay | Admitting: Oncology

## 2013-07-30 ENCOUNTER — Other Ambulatory Visit: Payer: Self-pay | Admitting: Medical Oncology

## 2013-07-30 DIAGNOSIS — C187 Malignant neoplasm of sigmoid colon: Secondary | ICD-10-CM

## 2013-07-30 MED ORDER — ALLOPURINOL 100 MG PO TABS: 200.0000 mg | ORAL_TABLET | Freq: Two times a day (BID) | ORAL | Status: DC

## 2013-08-01 ENCOUNTER — Other Ambulatory Visit: Payer: Self-pay | Admitting: Physician Assistant

## 2013-08-12 ENCOUNTER — Other Ambulatory Visit: Payer: Self-pay | Admitting: Emergency Medicine

## 2013-08-12 ENCOUNTER — Other Ambulatory Visit: Payer: Self-pay | Admitting: Physician Assistant

## 2013-08-14 ENCOUNTER — Other Ambulatory Visit (HOSPITAL_BASED_OUTPATIENT_CLINIC_OR_DEPARTMENT_OTHER): Payer: Medicare Other | Admitting: Lab

## 2013-08-14 ENCOUNTER — Telehealth: Payer: Self-pay

## 2013-08-14 ENCOUNTER — Other Ambulatory Visit: Payer: Self-pay

## 2013-08-14 DIAGNOSIS — D72819 Decreased white blood cell count, unspecified: Secondary | ICD-10-CM

## 2013-08-14 DIAGNOSIS — D708 Other neutropenia: Secondary | ICD-10-CM

## 2013-08-14 LAB — CBC WITH DIFFERENTIAL/PLATELET
BASO%: 0.2 % (ref 0.0–2.0)
EOS%: 0.3 % (ref 0.0–7.0)
Eosinophils Absolute: 0.1 10*3/uL (ref 0.0–0.5)
LYMPH%: 6.2 % — ABNORMAL LOW (ref 14.0–49.0)
MCH: 30.9 pg (ref 27.2–33.4)
MCHC: 32.7 g/dL (ref 32.0–36.0)
MCV: 94.4 fL (ref 79.3–98.0)
MONO%: 1 % (ref 0.0–14.0)
NEUT#: 34.4 10*3/uL — ABNORMAL HIGH (ref 1.5–6.5)
RBC: 3.56 10*6/uL — ABNORMAL LOW (ref 4.20–5.82)
RDW: 14.5 % (ref 11.0–14.6)

## 2013-08-14 NOTE — Telephone Encounter (Signed)
S/w shirley that Dr Rosie Fate reviewed labs and not to change dose of neupogen. We will check labs in 2 weeks. To expect a call from scheduler with date and time. [name] expressed understanding.  Gaylord Shih, RN 1:46 PM

## 2013-08-16 ENCOUNTER — Telehealth: Payer: Self-pay | Admitting: Internal Medicine

## 2013-08-16 NOTE — Telephone Encounter (Signed)
S/w the pt's wife and she is aware of the two week lab appt in sept.

## 2013-08-22 ENCOUNTER — Other Ambulatory Visit: Payer: Self-pay

## 2013-08-22 MED ORDER — FUROSEMIDE 20 MG PO TABS: ORAL_TABLET | ORAL | Status: DC

## 2013-08-27 ENCOUNTER — Other Ambulatory Visit: Payer: Self-pay | Admitting: Physician Assistant

## 2013-08-28 ENCOUNTER — Other Ambulatory Visit (HOSPITAL_BASED_OUTPATIENT_CLINIC_OR_DEPARTMENT_OTHER): Payer: Medicare Other | Admitting: Lab

## 2013-08-28 DIAGNOSIS — D72819 Decreased white blood cell count, unspecified: Secondary | ICD-10-CM

## 2013-08-28 DIAGNOSIS — D708 Other neutropenia: Secondary | ICD-10-CM

## 2013-08-28 LAB — CBC WITH DIFFERENTIAL/PLATELET
BASO%: 0.4 % (ref 0.0–2.0)
Eosinophils Absolute: 0.2 10*3/uL (ref 0.0–0.5)
LYMPH%: 11.5 % — ABNORMAL LOW (ref 14.0–49.0)
MCHC: 33 g/dL (ref 32.0–36.0)
MONO#: 0.3 10*3/uL (ref 0.1–0.9)
NEUT#: 25.5 10*3/uL — ABNORMAL HIGH (ref 1.5–6.5)
Platelets: 214 10*3/uL (ref 140–400)
RBC: 3.55 10*6/uL — ABNORMAL LOW (ref 4.20–5.82)
RDW: 14.6 % (ref 11.0–14.6)
WBC: 29.6 10*3/uL — ABNORMAL HIGH (ref 4.0–10.3)

## 2013-08-30 ENCOUNTER — Other Ambulatory Visit: Payer: Self-pay | Admitting: Physician Assistant

## 2013-09-08 ENCOUNTER — Other Ambulatory Visit: Payer: Self-pay | Admitting: Physician Assistant

## 2013-09-08 ENCOUNTER — Other Ambulatory Visit: Payer: Self-pay | Admitting: Emergency Medicine

## 2013-09-14 ENCOUNTER — Other Ambulatory Visit: Payer: Self-pay | Admitting: Medical Oncology

## 2013-09-14 DIAGNOSIS — D72819 Decreased white blood cell count, unspecified: Secondary | ICD-10-CM

## 2013-09-16 ENCOUNTER — Other Ambulatory Visit: Payer: Self-pay | Admitting: Emergency Medicine

## 2013-09-17 ENCOUNTER — Other Ambulatory Visit: Payer: Self-pay | Admitting: Physician Assistant

## 2013-09-17 ENCOUNTER — Telehealth: Payer: Self-pay | Admitting: Cardiology

## 2013-09-17 ENCOUNTER — Other Ambulatory Visit (HOSPITAL_BASED_OUTPATIENT_CLINIC_OR_DEPARTMENT_OTHER): Payer: Medicare Other | Admitting: Lab

## 2013-09-17 DIAGNOSIS — I2510 Atherosclerotic heart disease of native coronary artery without angina pectoris: Secondary | ICD-10-CM

## 2013-09-17 DIAGNOSIS — D72819 Decreased white blood cell count, unspecified: Secondary | ICD-10-CM

## 2013-09-17 LAB — CBC WITH DIFFERENTIAL/PLATELET
BASO%: 1.1 % (ref 0.0–2.0)
EOS%: 1.4 % (ref 0.0–7.0)
HCT: 32.8 % — ABNORMAL LOW (ref 38.4–49.9)
HGB: 11.1 g/dL — ABNORMAL LOW (ref 13.0–17.1)
MCH: 31.6 pg (ref 27.2–33.4)
MCHC: 34 g/dL (ref 32.0–36.0)
MCV: 93.1 fL (ref 79.3–98.0)
MONO#: 0.3 10*3/uL (ref 0.1–0.9)
MONO%: 5 % (ref 0.0–14.0)
NEUT%: 63 % (ref 39.0–75.0)
RBC: 3.53 10*6/uL — ABNORMAL LOW (ref 4.20–5.82)
lymph#: 1.7 10*3/uL (ref 0.9–3.3)

## 2013-09-17 MED ORDER — NITROGLYCERIN 0.4 MG SL SUBL: 0.4000 mg | SUBLINGUAL_TABLET | SUBLINGUAL | Status: DC | PRN

## 2013-09-17 NOTE — Telephone Encounter (Signed)
New Problem  Pt states he was given nitro's and experienced 2 seizures on Saturday.. Dr. Jens Som did not prescribe the RX but he needs him to refill it// please call to discuss.

## 2013-09-17 NOTE — Telephone Encounter (Signed)
Pt's wife is asking for a refill for NTG that was given to him by another cardiologist. Pt has a history of seizures and wife would like to be sure OK to refill and use NTG prn for chest pain. I will forward to Dr Jens Som for review

## 2013-09-17 NOTE — Telephone Encounter (Signed)
The patient's wife is aware. RX refill to be sent to [facility] on [address]

## 2013-09-17 NOTE — Telephone Encounter (Signed)
Ok for PRN NTG. Olga Millers

## 2013-09-21 ENCOUNTER — Other Ambulatory Visit: Payer: Self-pay | Admitting: Oncology

## 2013-09-21 DIAGNOSIS — D72819 Decreased white blood cell count, unspecified: Secondary | ICD-10-CM

## 2013-09-21 DIAGNOSIS — D708 Other neutropenia: Secondary | ICD-10-CM

## 2013-09-21 DIAGNOSIS — Z862 Personal history of diseases of the blood and blood-forming organs and certain disorders involving the immune mechanism: Secondary | ICD-10-CM

## 2013-10-02 ENCOUNTER — Telehealth: Payer: Self-pay

## 2013-10-02 ENCOUNTER — Other Ambulatory Visit: Payer: Self-pay | Admitting: Physician Assistant

## 2013-10-02 NOTE — Telephone Encounter (Signed)
Pt has a f/u appointment with Dr. Cleta Alberts on 11/11 but is in need of a refill of Doxazosin until then. [facility] uses the [facility] [address]  Pt's # is [phone]

## 2013-10-02 NOTE — Telephone Encounter (Signed)
Sent in

## 2013-10-04 ENCOUNTER — Other Ambulatory Visit: Payer: Self-pay | Admitting: Emergency Medicine

## 2013-10-08 ENCOUNTER — Other Ambulatory Visit: Payer: Self-pay | Admitting: Physician Assistant

## 2013-10-12 ENCOUNTER — Other Ambulatory Visit: Payer: Self-pay | Admitting: Physician Assistant

## 2013-10-12 ENCOUNTER — Other Ambulatory Visit: Payer: Self-pay

## 2013-10-12 ENCOUNTER — Other Ambulatory Visit: Payer: Self-pay | Admitting: Emergency Medicine

## 2013-10-12 DIAGNOSIS — Z862 Personal history of diseases of the blood and blood-forming organs and certain disorders involving the immune mechanism: Secondary | ICD-10-CM

## 2013-10-12 DIAGNOSIS — D72819 Decreased white blood cell count, unspecified: Secondary | ICD-10-CM

## 2013-10-12 DIAGNOSIS — C187 Malignant neoplasm of sigmoid colon: Secondary | ICD-10-CM

## 2013-10-12 DIAGNOSIS — D539 Nutritional anemia, unspecified: Secondary | ICD-10-CM

## 2013-10-14 ENCOUNTER — Other Ambulatory Visit: Payer: Self-pay | Admitting: Emergency Medicine

## 2013-10-15 ENCOUNTER — Encounter (INDEPENDENT_AMBULATORY_CARE_PROVIDER_SITE_OTHER): Payer: Self-pay

## 2013-10-15 ENCOUNTER — Ambulatory Visit (HOSPITAL_BASED_OUTPATIENT_CLINIC_OR_DEPARTMENT_OTHER): Payer: Medicare Other | Admitting: Internal Medicine

## 2013-10-15 ENCOUNTER — Telehealth: Payer: Self-pay | Admitting: Internal Medicine

## 2013-10-15 ENCOUNTER — Other Ambulatory Visit (HOSPITAL_BASED_OUTPATIENT_CLINIC_OR_DEPARTMENT_OTHER): Payer: Medicare Other | Admitting: Lab

## 2013-10-15 VITALS — BP 172/74 | HR 67 | Temp 97.6°F | Resp 18 | Ht 75.0 in | Wt 205.9 lb

## 2013-10-15 DIAGNOSIS — C187 Malignant neoplasm of sigmoid colon: Secondary | ICD-10-CM

## 2013-10-15 DIAGNOSIS — D649 Anemia, unspecified: Secondary | ICD-10-CM

## 2013-10-15 DIAGNOSIS — D708 Other neutropenia: Secondary | ICD-10-CM

## 2013-10-15 DIAGNOSIS — D72819 Decreased white blood cell count, unspecified: Secondary | ICD-10-CM

## 2013-10-15 DIAGNOSIS — Z862 Personal history of diseases of the blood and blood-forming organs and certain disorders involving the immune mechanism: Secondary | ICD-10-CM

## 2013-10-15 LAB — CBC WITH DIFFERENTIAL/PLATELET
Basophils Absolute: 0.1 10*3/uL (ref 0.0–0.1)
Eosinophils Absolute: 0.2 10*3/uL (ref 0.0–0.5)
HGB: 11.9 g/dL — ABNORMAL LOW (ref 13.0–17.1)
MCV: 94 fL (ref 79.3–98.0)
MONO#: 0.4 10*3/uL (ref 0.1–0.9)
NEUT#: 6.3 10*3/uL (ref 1.5–6.5)
RDW: 14.4 % (ref 11.0–14.6)
WBC: 9.6 10*3/uL (ref 4.0–10.3)
lymph#: 2.6 10*3/uL (ref 0.9–3.3)

## 2013-10-15 LAB — COMPREHENSIVE METABOLIC PANEL (CC13)
Albumin: 4.2 g/dL (ref 3.5–5.0)
Alkaline Phosphatase: 149 U/L (ref 40–150)
BUN: 15.6 mg/dL (ref 7.0–26.0)
CO2: 25 mEq/L (ref 22–29)
Calcium: 9.7 mg/dL (ref 8.4–10.4)
Chloride: 102 mEq/L (ref 98–109)
Glucose: 122 mg/dl (ref 70–140)
Potassium: 4.6 mEq/L (ref 3.5–5.1)
Sodium: 140 mEq/L (ref 136–145)
Total Bilirubin: 0.55 mg/dL (ref 0.20–1.20)
Total Protein: 7.9 g/dL (ref 6.4–8.3)

## 2013-10-15 LAB — IRON AND TIBC CHCC
Iron: 53 ug/dL (ref 42–163)
TIBC: 388 ug/dL (ref 202–409)

## 2013-10-15 LAB — LACTATE DEHYDROGENASE (CC13): LDH: 154 U/L (ref 125–245)

## 2013-10-15 NOTE — Telephone Encounter (Signed)
sw pt adv appts per 11/3 POF cal mailed shh

## 2013-10-16 LAB — CYCLOSPORINE LEVEL: Cyclosporine, Blood: 384 ng/mL — ABNORMAL HIGH (ref 140–330)

## 2013-10-16 NOTE — Progress Notes (Signed)
[name], [name] Head, MD [address] [place] [address]  DIAGNOSIS: ADENOCARCINOMA, SIGMOID COLON - Plan: CBC with Differential in 1 month, CBC with Differential in 2 months, CBC with Differential, CBC with Differential, Cyclosporine level, CEA  ANEMIA, HX OF - Plan: CBC with Differential in 1 month, CBC with Differential in 2 months, CBC with Differential, CBC with Differential  Leukopenia - Plan: CBC with Differential in 1 month, CBC with Differential in 2 months, CBC with Differential, CBC with Differential  Autoimmune neutropenia  Chief Complaint  Patient presents with  . History of rectal cancer    CURRENT THERAPY: 1. Neupogen 300 mcg subcu, currently 4 times a week. Neupogen was  started in late December 2011. As of 06/14/2013, Neupogen dose will be 300 mcg subcu 3 times weekly.  2. Cyclosporine 125 mg twice daily. Cyclosporine was started in August 2012.  INTERVAL HISTORY: [name] 79 y.o. 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  He was last seen by Dr. [name] on 06/14/2013.  Today, he is accompanied by his daughter Kriste Basque.  He continues to do well on his current treatment of autoimmune neutropenia.  We have been checking his white count monthly.  He denies any recent hospitalizations or emergency room visits. He reports continued episodic seizures with the last one being nearly two weeks ago.  They are almost always occur during sleep and his medicines are being adjusted.  He reports poor sleep quality over the past few days due to his wife recently being admitted to hospital for a health condition.  His last colonoscopy was on 09/03/2010 and he denies hematochezia or melena.    MEDICAL HISTORY: Past Medical History  Diagnosis Date  . Aortic root dilatation   . DM2 (diabetes  mellitus, type 2)   . Stroke   . HLD (hyperlipidemia)   . HTN (hypertension)   . CAD (coronary artery disease)     Presumed from previously mildy abnormal myoview;  myoview 4/12: EF 73%, no ischemia  . Leukopenia   . Pneumothorax   . Rectal cancer   . Cardiomyopathy     a. echo 5/12: EF 45-50%, mild LVH, grade 2 diast dysfxn, RAE  . Colon cancer   . Chronic kidney disease     renal insuff, increased uric acid  . Seizures   . Diabetes mellitus   . BPH (benign prostatic hyperplasia)     increased PSA  . Anemia    PROBLEM LIST:  1. 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 The patient had bone marrow on 04/03/2009 and again on 04/08/2010. FISH studies were negative as was the bone marrow. Neupogen was started in late December 2011. Cyclosporine (Gengraf) was started on August 04, 2011.  2. Well-differentiated adenocarcinoma arising in a tubulovillous adenoma, pT1 N0 with 0/13 lymph nodes positive, submucosal invasion positive for lymphovascular invasion but negative for perineural  invasion. The tumor was located in the proximal rectum first detected on colonoscopy on 09/03/2010. Laparoscopic-assisted low anterior resection with a diverting loop ileostomy was carried out  on 12/23/2010. Closure of the loop ileostomy occurred on 04/06/2011. The patient is without evidence of recurrence. Colonoscopy carried out by Dr. Sheryn Bison 12/22/2011 was without significant findings.  3. Renal insufficiency.  4. Hyperuricemia.  5. Anemia possibly related to renal insufficiency.  6.  Possible seizures dating back to December 2009.  7. History of C difficile diarrhea.  8. Diabetes mellitus.  9. Hypertension.  10. Dyslipidemia.  11. BPH.  12. History of skin cancers.  13. Status post left total hip replacement.  14. Coronary artery disease.  15. History of recurrent febrile neutropenia with negative cultures  requiring hospital admissions.  INTERIM HISTORY: has ADENOCARCINOMA,  SIGMOID COLON; DIABETES MELLITUS-TYPE II; HYPERLIPIDEMIA-MIXED; HYPERTENSION, UNSPECIFIED; CAD, NATIVE VESSEL; ANEMIA, HX OF; Preoperative evaluation to rule out surgical contraindication; Leukopenia; Cardiomyopathy; Autoimmune neutropenia; BPH (benign prostatic hyperplasia); Special screening for malignant neoplasms, colon; Leucopenia; Personal history of malignant neoplasm of rectum, rectosigmoid junction, and anus; S/P left colectomy; Unspecified deficiency anemia; and History of rectal cancer on his problem list.    ALLERGIES:  has No Known Allergies.  MEDICATIONS: has a current medication list which includes the following prescription(s): allopurinol, aspirin, b-d ins syr microfine 1cc/28g, vitamin d-3, co-enzyme q-10, cyclosporine modified, donepezil, doxazosin, filgrastim, finasteride, fish oil-omega-3 fatty acids, furosemide, insulin regular, lamotrigine, metoprolol succinate, nitroglycerin, one touch ultra test, and pravastatin.  SURGICAL HISTORY:  Past Surgical History  Procedure Laterality Date  . Hip arthroplasty    . Tonsillectomy    . S/p resection of rectal cancer    . Vasectomy    . Ileostomy  12/2010  . Reversal ileostomy  03/2011    REVIEW OF SYSTEMS:   Constitutional: Denies fevers, chills or abnormal weight loss Eyes: Denies blurriness of vision Ears, nose, mouth, throat, and face: Denies mucositis or sore throat Respiratory: Denies cough, dyspnea or wheezes Cardiovascular: Denies palpitation, chest discomfort or lower extremity swelling Gastrointestinal:  Denies nausea, heartburn or change in bowel habits Skin: Denies abnormal skin rashes Lymphatics: Denies new lymphadenopathy or easy bruising Neurological:Denies numbness, tingling or new weaknesses Behavioral/Psych: Mood is stable, no new changes  All other systems were reviewed with the patient and are negative.  PHYSICAL EXAMINATION: ECOG PERFORMANCE STATUS: 0 - Asymptomatic  Blood pressure 172/74, pulse 67,  temperature 97.6 F (36.4 C), temperature source Oral, resp. rate 18, height 6\' 3"  (1.905 m), weight 205 lb 14.4 oz (93.396 kg), SpO2 97.00%.  GENERAL:alert, no distress and comfortable; chronically ill appearing.  SKIN: skin color, texture, turgor are normal, no rashes or significant lesions EYES: normal, Conjunctiva are pink and non-injected, sclera clear OROPHARYNX:no exudate, no erythema and lips, buccal mucosa, and tongue normal  NECK: supple, thyroid normal size, non-tender, without nodularity LYMPH:  no palpable lymphadenopathy in the cervical, axillary or supraclavicular LUNGS: clear to auscultation and percussion with normal breathing effort HEART: regular rate & rhythm and no murmurs and trace lower extremity edema bilaterally. ABDOMEN:abdomen soft, non-tender and normal bowel sounds Musculoskeletal:no cyanosis of digits and no clubbing  NEURO: alert & oriented x 3 with fluent speech, no focal motor/sensory deficits  LABORATORY DATA: Results for orders placed in visit on 10/15/13 (from the past 48 hour(s))  CBC WITH DIFFERENTIAL     Status: Abnormal   Collection Time    10/15/13  1:31 PM      Result Value Range   WBC 9.6  4.0 - 10.3 10e3/uL   NEUT# 6.3  1.5 - 6.5 10e3/uL   HGB 11.9 (*) 13.0 - 17.1 g/dL   HCT 95.6 (*) 21.3 - 08.6 %   Platelets 242  140 - 400 10e3/uL   MCV 94.0  79.3 - 98.0 fL   MCH 31.1  27.2 - 33.4 pg   MCHC 33.1  32.0 - 36.0 g/dL   RBC 5.78 (*)  4.20 - 5.82 10e6/uL   RDW 14.4  11.0 - 14.6 %   lymph# 2.6  0.9 - 3.3 10e3/uL   MONO# 0.4  0.1 - 0.9 10e3/uL   Eosinophils Absolute 0.2  0.0 - 0.5 10e3/uL   Basophils Absolute 0.1  0.0 - 0.1 10e3/uL   NEUT% 66.1  39.0 - 75.0 %   LYMPH% 27.1  14.0 - 49.0 %   MONO% 4.0  0.0 - 14.0 %   EOS% 1.7  0.0 - 7.0 %   BASO% 1.1  0.0 - 2.0 %  COMPREHENSIVE METABOLIC PANEL ([facility])     Status: Abnormal   Collection Time    10/15/13  1:31 PM      Result Value Range   Sodium 140  136 - 145 mEq/L   Potassium 4.6  3.5 - 5.1  mEq/L   Chloride 102  98 - 109 mEq/L   CO2 25  22 - 29 mEq/L   Glucose 122  70 - 140 mg/dl   BUN 45.4  7.0 - 09.8 mg/dL   Creatinine 1.5 (*) 0.7 - 1.3 mg/dL   Total Bilirubin 1.19  0.20 - 1.20 mg/dL   Alkaline Phosphatase 149  40 - 150 U/L   AST 19  5 - 34 U/L   ALT 11  0 - 55 U/L   Total Protein 7.9  6.4 - 8.3 g/dL   Albumin 4.2  3.5 - 5.0 g/dL   Calcium 9.7  8.4 - 14.7 mg/dL   Anion Gap 13 (*) 3 - 11 mEq/L  LACTATE DEHYDROGENASE ([facility]13)     Status: None   Collection Time    10/15/13  1:31 PM      Result Value Range   LDH 154  125 - 245 U/L  IRON AND TIBC [facility]     Status: Abnormal   Collection Time    10/15/13  1:31 PM      Result Value Range   Iron 53  42 - 163 ug/dL   TIBC 829  562 - 130 ug/dL   UIBC 865  784 - 696 ug/dL   %SAT 14 (*) 20 - 55 %  FERRITIN [facility]     Status: None   Collection Time    10/15/13  1:31 PM      Result Value Range   Ferritin 65  22 - 316 ng/ml       Labs:  Lab Results  Component Value Date   WBC 9.6 10/15/2013   HGB 11.9* 10/15/2013   HCT 36.0* 10/15/2013   MCV 94.0 10/15/2013   PLT 242 10/15/2013   NEUTROABS 6.3 10/15/2013      Chemistry      Component Value Date/Time   NA 140 10/15/2013 1331   NA 139 08/01/2012 1448   K 4.6 10/15/2013 1331   K 5.0 08/01/2012 1448   CL 104 02/21/2013 1026   CL 106 08/01/2012 1448   CO2 25 10/15/2013 1331   CO2 27 08/01/2012 1448   BUN 15.6 10/15/2013 1331   BUN 35* 08/01/2012 1448   CREATININE 1.5* 10/15/2013 1331   CREATININE 1.49* 08/01/2012 1448   CREATININE 1.53* 07/20/2012 0836   CREATININE 0.89 07/01/2010 1044   GLU 228* 08/13/2010 1312      Component Value Date/Time   CALCIUM 9.7 10/15/2013 1331   CALCIUM 9.1 08/01/2012 1448   ALKPHOS 149 10/15/2013 1331   ALKPHOS 123* 08/01/2012 1448   AST 19 10/15/2013 1331   AST 16 08/01/2012 1448  ALT 11 10/15/2013 1331   ALT 12 08/01/2012 1448   BILITOT 0.55 10/15/2013 1331   BILITOT 0.4 08/01/2012 1448     Basic Metabolic Panel:  Recent Labs Lab 10/15/13 1331   NA 140  K 4.6  CO2 25  GLUCOSE 122  BUN 15.6  CREATININE 1.5*  CALCIUM 9.7   GFR Estimated Creatinine Clearance: 47.7 ml/min (by C-G formula based on Cr of 1.5). Liver Function Tests:  Recent Labs Lab 10/15/13 1331  AST 19  ALT 11  ALKPHOS 149  BILITOT 0.55  PROT 7.9  ALBUMIN 4.2   CBC:  Recent Labs Lab 10/15/13 1331  WBC 9.6  NEUTROABS 6.3  HGB 11.9*  HCT 36.0*  MCV 94.0  PLT 242    Recent Labs  10/15/13 1331  FERRITIN 65  TIBC 388  IRON 53   Results for DENNYS, GUIN (MRN [id number]) as of 10/16/2013 16:37  Ref. Range 12/03/2010 10:11 10/11/2011 12:22 01/14/2012 11:18 10/30/2012 09:43 02/21/2013 10:26  CEA Latest Range: 0.0-5.0 ng/mL 1.4 2.2 1.3 2.2 1.6   Studies:  No results found.   RADIOGRAPHIC STUDIES: No results found.  ASSESSMENT: [name] 79 y.o. male with a history of ADENOCARCINOMA, SIGMOID COLON - Plan: CBC with Differential in 1 month, CBC with Differential in 2 months, CBC with Differential, CBC with Differential, Cyclosporine level, CEA  ANEMIA, HX OF - Plan: CBC with Differential in 1 month, CBC with Differential in 2 months, CBC with Differential, CBC with Differential  Leukopenia - Plan: CBC with Differential in 1 month, CBC with Differential in 2 months, CBC with Differential, CBC with Differential  Autoimmune neutropenia   PLAN: 1. Autoimmune neutropenia. - He continues to do well.  His current dose of neupogen is 300 mcg SQ 4 days weeks on Monday, Wednesday, Friday (recently decreased from 4x per week).  His neupogen was titrated down due to elevated WBC.  His neutropenia is resolved with an ANC of 6.3.  --We will continue cyclosporine at the same dose 125 mg twice a day.   2. Sigmoid Colon adenocarcinoma.  - Colonoscopy carried out by Dr. Sheryn Bison 12/22/2011 without significant finding. Last CEA level is 1.6.   3. Anemia.  - Likely related to mild renal insufficiency.  Hemoglobin is 11.9 today.  He is without  symptoms of anemia.   4. Follow-up.  --Continue to check CBCs monthly and patient instructed to return in 4 months, with a repeat CBC, chemistries, LDH, CEA and a trough cyclosporine level.   All questions were answered. The patient knows to call the clinic with any problems, questions or concerns. We can certainly see the patient much sooner if necessary.  I spent 15 minutes counseling the patient face to face. The total time spent in the appointment was 25 minutes.    Taneesha Edgin, MD 10/16/2013 4:37 PM

## 2013-10-23 ENCOUNTER — Telehealth: Payer: Self-pay | Admitting: Family Medicine

## 2013-10-23 ENCOUNTER — Ambulatory Visit (INDEPENDENT_AMBULATORY_CARE_PROVIDER_SITE_OTHER): Payer: Medicare Other | Admitting: Emergency Medicine

## 2013-10-23 ENCOUNTER — Ambulatory Visit: Payer: Medicare Other | Admitting: Emergency Medicine

## 2013-10-23 ENCOUNTER — Telehealth: Payer: Self-pay

## 2013-10-23 ENCOUNTER — Encounter: Payer: Self-pay | Admitting: Emergency Medicine

## 2013-10-23 VITALS — BP 136/64 | HR 71 | Temp 97.9°F | Resp 16 | Ht 74.0 in | Wt 210.0 lb

## 2013-10-23 DIAGNOSIS — Z23 Encounter for immunization: Secondary | ICD-10-CM

## 2013-10-23 DIAGNOSIS — D61818 Other pancytopenia: Secondary | ICD-10-CM

## 2013-10-23 DIAGNOSIS — E119 Type 2 diabetes mellitus without complications: Secondary | ICD-10-CM

## 2013-10-23 DIAGNOSIS — F329 Major depressive disorder, single episode, unspecified: Secondary | ICD-10-CM

## 2013-10-23 MED ORDER — INSULIN REGULAR HUMAN 100 UNIT/ML IJ SOLN: INTRAMUSCULAR | Status: DC

## 2013-10-23 NOTE — Telephone Encounter (Signed)
Dr. Cleta Alberts spoke with [name] and let him know that [name] asked for his flu shot.  Informed daughter in law that he should be fine with the second flu shot.  Also spoke with Donna Christen and she stated that patient stated that he wanted the flu shot.  Our system shows the last flu shot was given in October 2013.  Patient was given the flu shot today per patient request.  daugter in law was ok with what Dr. Cleta Alberts told her about second flu shot  given.

## 2013-10-23 NOTE — Telephone Encounter (Signed)
Patient's daughter in law Lawson Fiscal) has questions. Patient received flu shot and she was not aware and is concerned that it may make him sick. Please call back at [phone].

## 2013-10-23 NOTE — Telephone Encounter (Signed)
He has already had his flu shot in october, and got another one today I have advised not a live vaccine, so it should be okay, but told her I will send message to you to advise also.

## 2013-10-23 NOTE — Telephone Encounter (Signed)
I called and spoke to [name]. The conversation is documented in the chart. He will come into the office if he gets any signs of illness .

## 2013-10-23 NOTE — Progress Notes (Signed)
  Subjective:    Patient ID: [name], male    DOB: 1932-08-22, 79 y.o.   MRN: [id number]  HPI patient enters for followup of his diabetes. He is currently on sliding scale insulin. He checks his sugar prior to meals and then gives Humulin R insulin as instructed. His wife was recently diagnosed with pancreatic cancer and is under hospice care.    Review of Systems     Objective:   Physical Exam patient is alert and cooperative. Neck is supple. Chest clear heart regular rate no murmurs abdomen soft nontender extremity exam reveals pulses present distally additional skin breakdown there is no edema.       Assessment & Plan:  He is currently continue sliding scale insulin. He gets 6 units for sugars between 100 and 150. 8 units for sugars between 150 and 200. 12 units for sugars between 200 and 250.. 14 units for sugars between 250 and 300. 16 units for sugars between 300 and 350. 18 units for sugars between 350 and 400 .22 units for sugars greater than 400.

## 2013-10-24 ENCOUNTER — Telehealth: Payer: Self-pay

## 2013-10-24 NOTE — Telephone Encounter (Signed)
Dr. Daub please advise. 

## 2013-10-24 NOTE — Telephone Encounter (Signed)
LORI WANTED DR DAUB TO KNOW THAT [name]'S WIFE PASSED AWAY A FEW HOURS AGO AND SHE WANTED TO KNOW IF YOU WOULD PRESCRIBE HIM SOME ANTIDEPRESSANT MEDICINE PLEASE CALL [phone]     [facility] [address]

## 2013-10-25 ENCOUNTER — Telehealth: Payer: Self-pay | Admitting: Internal Medicine

## 2013-10-25 NOTE — Telephone Encounter (Signed)
Informed his daughter that his level was mildly elevated and she state he had just taken the medication prior to seeing me in the clinic.

## 2013-10-25 NOTE — Telephone Encounter (Signed)
I called the daughter-in-law Berta Minor no other be happy to help in any way I could regarding the situation and the loss of Mr. Shepherds wife.

## 2013-11-01 NOTE — Telephone Encounter (Signed)
Ambien would not be a good choice for him since he is on Aricept. He can take a low dose of alprazolam 0.25 and see if that would help him at night. If they're agreeable to that okay to call and alprazolam 0.25 one at bedtime he can have #20 tablets no refill. Patient's at his age did not do well with the standard antidepressants

## 2013-11-01 NOTE — Telephone Encounter (Signed)
Patient's daughter Kriste Basque called and would like to know if Dr. Cleta Alberts can prescribe an antidepressant for patient (she says her sister forgot to ask about it during last phone conversation). She also states patient has not been sleeping and would like to know if she can give him one of her [place]. She wanted to make sure there were no contraindications with his current medications before she gave him one.  Best #[phone] Or [phone]

## 2013-11-02 MED ORDER — ALPRAZOLAM 0.25 MG PO TABS: 0.2500 mg | ORAL_TABLET | Freq: Every evening | ORAL | Status: DC | PRN

## 2013-11-02 NOTE — Telephone Encounter (Signed)
Called in rx for pt. Daughter was ok with this.

## 2013-11-06 ENCOUNTER — Other Ambulatory Visit: Payer: Self-pay | Admitting: Emergency Medicine

## 2013-11-14 ENCOUNTER — Other Ambulatory Visit (HOSPITAL_BASED_OUTPATIENT_CLINIC_OR_DEPARTMENT_OTHER): Payer: Medicare Other | Admitting: Lab

## 2013-11-14 DIAGNOSIS — C187 Malignant neoplasm of sigmoid colon: Secondary | ICD-10-CM

## 2013-11-14 DIAGNOSIS — Z862 Personal history of diseases of the blood and blood-forming organs and certain disorders involving the immune mechanism: Secondary | ICD-10-CM

## 2013-11-14 DIAGNOSIS — D72819 Decreased white blood cell count, unspecified: Secondary | ICD-10-CM

## 2013-11-14 LAB — CBC WITH DIFFERENTIAL/PLATELET
EOS%: 2.4 % (ref 0.0–7.0)
Eosinophils Absolute: 0.2 10*3/uL (ref 0.0–0.5)
HCT: 31.4 % — ABNORMAL LOW (ref 38.4–49.9)
LYMPH%: 30.5 % (ref 14.0–49.0)
MCHC: 33.2 g/dL (ref 32.0–36.0)
MONO#: 0.4 10*3/uL (ref 0.1–0.9)
NEUT#: 5.4 10*3/uL (ref 1.5–6.5)
NEUT%: 61.6 % (ref 39.0–75.0)
Platelets: 212 10*3/uL (ref 140–400)
RDW: 14.2 % (ref 11.0–14.6)
WBC: 8.7 10*3/uL (ref 4.0–10.3)

## 2013-11-15 ENCOUNTER — Other Ambulatory Visit: Payer: Self-pay | Admitting: Physician Assistant

## 2013-11-15 NOTE — Telephone Encounter (Signed)
Discussed with Lanora Manis, [place] okay to send proscar

## 2013-11-19 ENCOUNTER — Other Ambulatory Visit: Payer: Self-pay | Admitting: Emergency Medicine

## 2013-11-20 ENCOUNTER — Other Ambulatory Visit: Payer: Self-pay | Admitting: Physician Assistant

## 2013-11-20 DIAGNOSIS — Z85048 Personal history of other malignant neoplasm of rectum, rectosigmoid junction, and anus: Secondary | ICD-10-CM

## 2013-11-21 NOTE — Telephone Encounter (Signed)
Refill request to Dr. Rosie Fate for review by Triage on yesterday.  Response is to refill through next visit with PCP.  Scheduled for f/u on 02-26-2013 with Dr. Lesle Chris.

## 2013-11-23 ENCOUNTER — Other Ambulatory Visit: Payer: Self-pay | Admitting: Physician Assistant

## 2013-11-24 ENCOUNTER — Other Ambulatory Visit: Payer: Self-pay | Admitting: Emergency Medicine

## 2013-12-06 ENCOUNTER — Other Ambulatory Visit: Payer: Self-pay | Admitting: Emergency Medicine

## 2013-12-10 ENCOUNTER — Other Ambulatory Visit: Payer: Self-pay | Admitting: Emergency Medicine

## 2013-12-10 ENCOUNTER — Other Ambulatory Visit: Payer: Self-pay | Admitting: Physician Assistant

## 2013-12-11 ENCOUNTER — Other Ambulatory Visit: Payer: Self-pay

## 2013-12-11 NOTE — Telephone Encounter (Signed)
Dr Cleta Alberts, you recently saw pt for check up but don't see a discussion about this med. Can we RF it for him?

## 2013-12-18 ENCOUNTER — Other Ambulatory Visit: Payer: Self-pay | Admitting: Physician Assistant

## 2013-12-18 ENCOUNTER — Other Ambulatory Visit: Payer: Self-pay | Admitting: Emergency Medicine

## 2013-12-19 ENCOUNTER — Other Ambulatory Visit (HOSPITAL_BASED_OUTPATIENT_CLINIC_OR_DEPARTMENT_OTHER): Payer: Medicare Other

## 2013-12-19 DIAGNOSIS — C187 Malignant neoplasm of sigmoid colon: Secondary | ICD-10-CM

## 2013-12-19 DIAGNOSIS — Z862 Personal history of diseases of the blood and blood-forming organs and certain disorders involving the immune mechanism: Secondary | ICD-10-CM

## 2013-12-19 DIAGNOSIS — D72819 Decreased white blood cell count, unspecified: Secondary | ICD-10-CM

## 2013-12-19 LAB — CBC WITH DIFFERENTIAL/PLATELET
BASO%: 0.6 % (ref 0.0–2.0)
BASOS ABS: 0.1 10*3/uL (ref 0.0–0.1)
EOS%: 3.5 % (ref 0.0–7.0)
Eosinophils Absolute: 0.3 10*3/uL (ref 0.0–0.5)
HCT: 34.1 % — ABNORMAL LOW (ref 38.4–49.9)
HEMOGLOBIN: 11.5 g/dL — AB (ref 13.0–17.1)
LYMPH%: 24.7 % (ref 14.0–49.0)
MCH: 31.8 pg (ref 27.2–33.4)
MCHC: 33.7 g/dL (ref 32.0–36.0)
MCV: 94.4 fL (ref 79.3–98.0)
MONO#: 0.3 10*3/uL (ref 0.1–0.9)
MONO%: 3.3 % (ref 0.0–14.0)
NEUT#: 6.9 10*3/uL — ABNORMAL HIGH (ref 1.5–6.5)
NEUT%: 67.9 % (ref 39.0–75.0)
Platelets: 219 10*3/uL (ref 140–400)
RBC: 3.61 10*6/uL — ABNORMAL LOW (ref 4.20–5.82)
RDW: 14.3 % (ref 11.0–14.6)
WBC: 10.1 10*3/uL (ref 4.0–10.3)
lymph#: 2.5 10*3/uL (ref 0.9–3.3)

## 2014-01-04 ENCOUNTER — Other Ambulatory Visit: Payer: Self-pay | Admitting: Emergency Medicine

## 2014-01-15 ENCOUNTER — Ambulatory Visit (HOSPITAL_BASED_OUTPATIENT_CLINIC_OR_DEPARTMENT_OTHER): Payer: Medicare Other

## 2014-01-15 DIAGNOSIS — Z862 Personal history of diseases of the blood and blood-forming organs and certain disorders involving the immune mechanism: Secondary | ICD-10-CM

## 2014-01-15 DIAGNOSIS — D72819 Decreased white blood cell count, unspecified: Secondary | ICD-10-CM

## 2014-01-15 DIAGNOSIS — C187 Malignant neoplasm of sigmoid colon: Secondary | ICD-10-CM

## 2014-01-15 LAB — CBC WITH DIFFERENTIAL/PLATELET
BASO%: 0.5 % (ref 0.0–2.0)
BASOS ABS: 0.3 10*3/uL — AB (ref 0.0–0.1)
EOS%: 0.4 % (ref 0.0–7.0)
Eosinophils Absolute: 0.2 10*3/uL (ref 0.0–0.5)
HEMATOCRIT: 33.2 % — AB (ref 38.4–49.9)
HEMOGLOBIN: 10.8 g/dL — AB (ref 13.0–17.1)
LYMPH#: 3.2 10*3/uL (ref 0.9–3.3)
LYMPH%: 6.7 % — AB (ref 14.0–49.0)
MCH: 30.2 pg (ref 27.2–33.4)
MCHC: 32.4 g/dL (ref 32.0–36.0)
MCV: 93.3 fL (ref 79.3–98.0)
MONO#: 1.6 10*3/uL — ABNORMAL HIGH (ref 0.1–0.9)
MONO%: 3.3 % (ref 0.0–14.0)
NEUT#: 42.5 10*3/uL — ABNORMAL HIGH (ref 1.5–6.5)
NEUT%: 89.1 % — AB (ref 39.0–75.0)
PLATELETS: 259 10*3/uL (ref 140–400)
RBC: 3.56 10*6/uL — ABNORMAL LOW (ref 4.20–5.82)
RDW: 14 % (ref 11.0–14.6)
WBC: 47.8 10*3/uL — ABNORMAL HIGH (ref 4.0–10.3)

## 2014-01-17 ENCOUNTER — Telehealth: Payer: Self-pay

## 2014-01-17 NOTE — Telephone Encounter (Signed)
[name] called earlier about increase in WBC and ANC. S/w Dr Juliann Mule then [place]. Pt had received neupogen at 8am and had labs drawn at 1pm. Dr Juliann Mule said to keep neupogen at current dose and check lab in 2 weeks. [name] said she wrote down appt. POF to scheduler.

## 2014-01-22 ENCOUNTER — Other Ambulatory Visit: Payer: Self-pay | Admitting: Emergency Medicine

## 2014-01-24 ENCOUNTER — Other Ambulatory Visit: Payer: Self-pay | Admitting: Radiology

## 2014-01-24 NOTE — Telephone Encounter (Signed)
[facility] for [facility].

## 2014-01-28 ENCOUNTER — Other Ambulatory Visit: Payer: Self-pay | Admitting: Physician Assistant

## 2014-01-30 ENCOUNTER — Telehealth: Payer: Self-pay | Admitting: Internal Medicine

## 2014-01-30 NOTE — Telephone Encounter (Signed)
returned pt call and r/s lab appt o 2.20.14...Marland Kitchenok and aware

## 2014-01-31 ENCOUNTER — Other Ambulatory Visit: Payer: Medicare Other

## 2014-02-01 ENCOUNTER — Other Ambulatory Visit: Payer: Medicare Other

## 2014-02-01 ENCOUNTER — Other Ambulatory Visit: Payer: Self-pay | Admitting: Internal Medicine

## 2014-02-01 DIAGNOSIS — D72819 Decreased white blood cell count, unspecified: Secondary | ICD-10-CM

## 2014-02-01 LAB — CBC WITH DIFFERENTIAL/PLATELET
BASO%: 0.7 % (ref 0.0–2.0)
BASOS ABS: 0.1 10*3/uL (ref 0.0–0.1)
EOS%: 1.2 % (ref 0.0–7.0)
Eosinophils Absolute: 0.2 10*3/uL (ref 0.0–0.5)
HCT: 32.5 % — ABNORMAL LOW (ref 38.4–49.9)
HEMOGLOBIN: 10.6 g/dL — AB (ref 13.0–17.1)
LYMPH%: 15.5 % (ref 14.0–49.0)
MCH: 30.3 pg (ref 27.2–33.4)
MCHC: 32.6 g/dL (ref 32.0–36.0)
MCV: 93 fL (ref 79.3–98.0)
MONO#: 0.4 10*3/uL (ref 0.1–0.9)
MONO%: 2.4 % (ref 0.0–14.0)
NEUT#: 13.1 10*3/uL — ABNORMAL HIGH (ref 1.5–6.5)
NEUT%: 80.2 % — AB (ref 39.0–75.0)
Platelets: 240 10*3/uL (ref 140–400)
RBC: 3.49 10*6/uL — ABNORMAL LOW (ref 4.20–5.82)
RDW: 14.8 % — ABNORMAL HIGH (ref 11.0–14.6)
WBC: 16.4 10*3/uL — ABNORMAL HIGH (ref 4.0–10.3)
lymph#: 2.5 10*3/uL (ref 0.9–3.3)

## 2014-02-04 ENCOUNTER — Other Ambulatory Visit: Payer: Self-pay | Admitting: Physician Assistant

## 2014-02-04 ENCOUNTER — Telehealth: Payer: Self-pay | Admitting: Internal Medicine

## 2014-02-04 NOTE — Telephone Encounter (Signed)
Patient made aware of improvement in his WBC to 16 down from 49.  Likely neupogen effect.  Left on answering machine to call back with questions.

## 2014-02-08 ENCOUNTER — Ambulatory Visit (INDEPENDENT_AMBULATORY_CARE_PROVIDER_SITE_OTHER): Payer: Medicare Other | Admitting: Surgery

## 2014-02-11 ENCOUNTER — Encounter (INDEPENDENT_AMBULATORY_CARE_PROVIDER_SITE_OTHER): Payer: Self-pay | Admitting: Surgery

## 2014-02-11 ENCOUNTER — Ambulatory Visit (INDEPENDENT_AMBULATORY_CARE_PROVIDER_SITE_OTHER): Payer: Medicare Other | Admitting: Surgery

## 2014-02-11 VITALS — BP 134/78 | HR 68 | Temp 97.6°F | Ht 74.0 in | Wt 210.0 lb

## 2014-02-11 DIAGNOSIS — Z85048 Personal history of other malignant neoplasm of rectum, rectosigmoid junction, and anus: Secondary | ICD-10-CM

## 2014-02-11 NOTE — Progress Notes (Signed)
Subjective:     Patient ID: [name], male   DOB: 09/11/1932, 80 y.o.   MRN: [id number]  HPI  The patient returns to clinic today due to  history of rectal cancer stage I treated with laparoscopic low anterior resection with diverting loop ileostomy with closure of this in May 2012. He has chronic neutropenia. He is followed closely by Dr. Minta Balsam.   Review of Systems  Constitutional: Positive for fatigue.  HENT: Negative.   Eyes: Negative.   Respiratory: Negative.   Cardiovascular: Negative.   Gastrointestinal: Negative.        Objective:   Physical Exam  Constitutional: He appears well-developed and well-nourished.  HENT:  Head: Normocephalic and atraumatic.  Cardiovascular: Normal rate and regular rhythm.   Pulmonary/Chest: Effort normal and breath sounds normal.  Abdominal:  Soft non tender no hernia  Colonoscopy 2012 no evidence of recurrent disease. Neuro:  MEMORY SEEMS IMPAIRED.     Assessment:     Stage 1 rectal cancer history.    Plan:     Return in 1 year.D Chisolm following.  Will release if stable next year.

## 2014-02-11 NOTE — Patient Instructions (Signed)
Return 1 year. 

## 2014-02-12 ENCOUNTER — Other Ambulatory Visit (HOSPITAL_BASED_OUTPATIENT_CLINIC_OR_DEPARTMENT_OTHER): Payer: Medicare Other

## 2014-02-12 ENCOUNTER — Ambulatory Visit (HOSPITAL_BASED_OUTPATIENT_CLINIC_OR_DEPARTMENT_OTHER): Payer: Medicare Other | Admitting: Internal Medicine

## 2014-02-12 VITALS — BP 166/65 | HR 63 | Temp 97.5°F | Resp 18 | Ht 74.0 in | Wt 218.3 lb

## 2014-02-12 DIAGNOSIS — D72819 Decreased white blood cell count, unspecified: Secondary | ICD-10-CM

## 2014-02-12 DIAGNOSIS — C187 Malignant neoplasm of sigmoid colon: Secondary | ICD-10-CM

## 2014-02-12 DIAGNOSIS — D708 Other neutropenia: Secondary | ICD-10-CM

## 2014-02-12 DIAGNOSIS — Z862 Personal history of diseases of the blood and blood-forming organs and certain disorders involving the immune mechanism: Secondary | ICD-10-CM

## 2014-02-12 DIAGNOSIS — D649 Anemia, unspecified: Secondary | ICD-10-CM

## 2014-02-12 DIAGNOSIS — Z9049 Acquired absence of other specified parts of digestive tract: Secondary | ICD-10-CM

## 2014-02-12 LAB — CBC WITH DIFFERENTIAL/PLATELET
BASO%: 0.2 % (ref 0.0–2.0)
Basophils Absolute: 0.1 10*3/uL (ref 0.0–0.1)
EOS%: 0.8 % (ref 0.0–7.0)
Eosinophils Absolute: 0.4 10*3/uL (ref 0.0–0.5)
HCT: 32.6 % — ABNORMAL LOW (ref 38.4–49.9)
HGB: 10.2 g/dL — ABNORMAL LOW (ref 13.0–17.1)
LYMPH%: 8.4 % — ABNORMAL LOW (ref 14.0–49.0)
MCH: 29.2 pg (ref 27.2–33.4)
MCHC: 31.2 g/dL — AB (ref 32.0–36.0)
MCV: 93.6 fL (ref 79.3–98.0)
MONO#: 0.6 10*3/uL (ref 0.1–0.9)
MONO%: 1.3 % (ref 0.0–14.0)
NEUT#: 45.2 10*3/uL — ABNORMAL HIGH (ref 1.5–6.5)
NEUT%: 89.3 % — ABNORMAL HIGH (ref 39.0–75.0)
Platelets: 249 10*3/uL (ref 140–400)
RBC: 3.48 10*6/uL — ABNORMAL LOW (ref 4.20–5.82)
RDW: 14.6 % (ref 11.0–14.6)
WBC: 50.5 10*3/uL (ref 4.0–10.3)
lymph#: 4.3 10*3/uL — ABNORMAL HIGH (ref 0.9–3.3)

## 2014-02-12 NOTE — Patient Instructions (Signed)
Filgrastim, G-CSF injection What is this medicine? FILGRASTIM, G-CSF (fil GRA stim) stimulates the formation of white blood cells. This medicine is given to patients with conditions that may cause a decrease in white blood cells, like those receiving certain types of chemotherapy or bone marrow transplant. It helps the bone marrow recover its ability to produce white blood cells. Increasing the amount of white blood cells helps to decrease the risk of infection and fever. This medicine may be used for other purposes; ask your health care provider or pharmacist if you have questions. COMMON BRAND NAME(S): Neupogen What should I tell my health care provider before I take this medicine? They need to know if you have any of these conditions: -currently receiving radiation therapy -sickle cell disease -an unusual or allergic reaction to filgrastim, E. coli protein, other medicines, foods, dyes, or preservatives -pregnant or trying to get pregnant -breast-feeding How should I use this medicine? This medicine is for injection into a vein or injection under the skin. It is usually given by a health care professional in a hospital or clinic setting. If you get this medicine at home, you will be taught how to prepare and give this medicine. Always change the site for the injection under the skin. Let the solution warm to room temperature before you use it. Do not shake the solution before you withdraw a dose. Throw away any unused portion. Use exactly as directed. Take your medicine at regular intervals. Do not take your medicine more often than directed. It is important that you put your used needles and syringes in a special sharps container. Do not put them in a trash can. If you do not have a sharps container, call your pharmacist or healthcare provider to get one. Talk to your pediatrician regarding the use of this medicine in children. While this medicine may be prescribed for children for selected  conditions, precautions do apply. Overdosage: If you think you have taken too much of this medicine contact a poison control center or emergency room at once. NOTE: This medicine is only for you. Do not share this medicine with others. What if I miss a dose? Try not to miss doses. If you miss a dose take the dose as soon as you remember. If it is almost time for the next dose, do not take double doses unless told to by your doctor or health care professional. What may interact with this medicine? -lithium -medicines for cancer chemotherapy This list may not describe all possible interactions. Give your health care provider a list of all the medicines, herbs, non-prescription drugs, or dietary supplements you use. Also tell them if you smoke, drink alcohol, or use illegal drugs. Some items may interact with your medicine. What should I watch for while using this medicine? Visit your doctor or health care professional for regular checks on your progress. If you get a fever or any sign of infection while you are using this medicine, do not treat yourself. Check with your doctor or health care professional. Bone pain can usually be relieved by mild pain relievers such as acetaminophen or ibuprofen. Check with your doctor or health care professional before taking these medicines as they may hide a fever. Call your doctor or health care professional if the aches and pains are severe or do not go away. What side effects may I notice from receiving this medicine? Side effects that you should report to your doctor or health care professional as soon as possible: -allergic reactions   like skin rash, itching or hives, swelling of the face, lips, or tongue -difficulty breathing, wheezing -fever -pain, redness, or swelling at the injection site -stomach or side pain, or pain at the shoulder Side effects that usually do not require medical attention (report to your doctor or health care professional if they  continue or are bothersome): -bone pain (ribs, lower back, breast bone) -headache -skin rash This list may not describe all possible side effects. Call your doctor for medical advice about side effects. You may report side effects to FDA at 1-800-FDA-1088. Where should I keep my medicine? Keep out of the reach of children. Store in a refrigerator between 2 and 8 degrees C (36 and 46 degrees F). Do not freeze or leave in direct sunlight. If vials or syringes are left out of the refrigerator for more than 24 hours, they must be thrown away. Throw away unused vials after the expiration date on the carton. NOTE: This sheet is a summary. It may not cover all possible information. If you have questions about this medicine, talk to your doctor, pharmacist, or health care provider.  2014, Elsevier/Gold Standard. (2008-02-14 13:33:21) Cyclosporine oral solution What is this medicine? CYCLOSPORINE (SYE kloe spor een) is used to decrease the immune system's response to a transplanted organ. The medicine (Neoral only) is also used for rheumatoid arthritis and psoriasis. This medicine may be used for other purposes; ask your health care provider or pharmacist if you have questions. COMMON BRAND NAME(S): Sandimmune, SangCya What should I tell my health care provider before I take this medicine? They need to know if you have any of these conditions: -cancer -high blood pressure -immune system problems -infection -kidney disease -liver disease -previous coal tar, PUVA, ultraviolet, or radiation therapy -an unusual or allergic reaction to cyclosporine, alcohol, corn oil (Neoral only), castor oil (Neoral only), other medicines, foods, dyes, or preservatives -pregnant or trying to get pregnant -breast feeding How should I use this medicine? Take this medicine by mouth. Follow the directions on the prescription label. Use the dosing syringe provided to measure your dose. For the Sandimmune brand, mix your  dose in milk, chocolate milk, or orange juice (at room temperature). If you are taking any other brand of cyclosporine, mix the measured dose in a glass of orange or apple juice that is at room temperature. Do not use grapefruit juice or milk. Mix in a glass container (not plastic) and stir well just before taking. Rinse the glass with more liquid and swallow to make sure you get all the dose. Try to mix with the same diluent for each dose. This will help you keep a constant amount of cyclosporine in your body. After use, dry the outside of the dosing syringe with a clean towel. Do not rinse with water or any other cleaning agent. The dosing syringe must be dry before use. Take your medicine at regular intervals. Take it at the same time each day and at the same time in relation to meals. Do not take it more often than directed. Do not stop taking except on your doctor's advice. Talk to your pediatrician regarding the use of this medicine in children. Special care may be needed. While this drug may be prescribed for children as young as 1 year for selected conditions (Neoral) and for children as young as 6 months for selected conditions (Sandimmune), precautions do apply. Patients over 26 years old may have a stronger reaction and need a smaller dose. Overdosage: If you think  you have taken too much of this medicine contact a poison control center or emergency room at once. NOTE: This medicine is only for you. Do not share this medicine with others. What if I miss a dose? If you miss a dose, take it as soon as you can. If it is almost time for your next dose, take only that dose. Do not take double or extra doses. Call your doctor or health care professional if you miss more than one dose or if you miss doses on a regular basis. What may interact with this medicine? Do not take this medicine with any of the following medications: -bosentan -cidofovir -cisapride -mibefradil -ranolazine -red yeast rice,  monascus purpureus -St. John's wort -tacrolimus This medicine may also interact with the following medications: -acyclovir -allopurinol -amiloride -amiodarone -antibiotics like ciprofloxacin, gentamicin, tobramycin, vancomycin, trimethoprim; sulfamethoxazole, nafcillin, rifampin, rifabutin, azithromycin, clarithromycin, erythromycin, and quinupristin; dalfopristin -bromocriptine -carbamazepine -cimetidine -colchicine -danazol -digoxin -male hormones, including contraceptive or birth control pills -imatinib -medicines for fungal infections like amphotericin B, fluconazole, itraconazole, terbinafine, and ketoconazole -medicines for blood pressure like diltiazem, nicardipine, verapamil, enalapril, ramipril, and losartan -medicines for cholesterol like lovastatin, simvastatin, atorvastatin, and fenofibrate -medicines for HIV infection like indinavir, nelfinavir, ritonavir, and saquinavir -medicines that suppress the immune system -melphalan -methotrexate -metoclopramide -NSAIDs, medicines for pain and inflammation, like ibuprofen or naproxen -octreotide -orlistat -oxcarbazepine -phenobarbital -phenytoin -ranitidine -sirolimus -spironolactone -steroid medicines like prednisone or cortisone -sulfinpyrazone -ticlopidine -triamterene -vaccines -voriconazole This list may not describe all possible interactions. Give your health care provider a list of all the medicines, herbs, non-prescription drugs, or dietary supplements you use. Also tell them if you smoke, drink alcohol, or use illegal drugs. Some items may interact with your medicine. What should I watch for while using this medicine? This medicine can make you more sensitive to the sun. Keep out of the sun. If you cannot avoid being in the sun, wear protective clothing and use sunscreen. Do not use sun lamps or tanning beds/booths. You may get drowsy or dizzy. Do not drive, use machinery, or do anything that needs mental  alertness until you know how this medicine affects you. Do not stand or sit up quickly, especially if you are an older patient. This reduces the risk of dizzy or fainting spells. Alcohol may interfere with the effect of this medicine. Avoid alcoholic drinks. Visit your doctor or health care professional for regular checks on your progress. You will have regular blood checks. Do not change the brand of medicine unless directed by your doctor or health care professional. If you get a cold or other infection while taking this medicine, call your doctor or health care professional. Do not treat yourself. The medicine may decrease your body's ability to fight infections. The medicine can cause unusual growth of gum tissue and can make your gums bleed. Practice good oral hygiene, and be careful when brushing and flossing your teeth. See your dentist regularly. What side effects may I notice from receiving this medicine? Side effects that you should report to your doctor or health care professional as soon as possible: -allergic reactions like skin rash, itching or hives, swelling of the face, lips, or tongue -changes in vision -high blood pressure -increased urge to urinate or frequent urination -numbness or tingling in the hands and feet -seizures -severe stomach pain -vomiting -yellowing of the eyes or skin Side effects that usually do not require medical attention (report to your doctor or health care professional if they continue or  are bothersome): -bleeding or tender gums, overgrowth of gum tissue -diarrhea -excessive hair growth on the face or body -nausea -tremors This list may not describe all possible side effects. Call your doctor for medical advice about side effects. You may report side effects to FDA at 1-800-FDA-1088. Where should I keep my medicine? Keep out of the reach of children. Store Neoral at room temperature between 20 and 25 degrees C (68 and 77 degrees F). Store Sandimmune  below 30 degrees C (86 degrees F). Do not keep in the refrigerator. Do not freeze. Keep the medicine in the original packaging. Throw away any unused medicine after two months of opening the bottle or after the expiration date. At a temperature below 20 degrees C (68 degrees F) some brands of cyclosporine oral solution (e.g., Neoral and its generic forms) may form a gel or sediment. If this happens, let the medicine warm to room temperature (about 77 degrees F) before use. NOTE: This sheet is a summary. It may not cover all possible information. If you have questions about this medicine, talk to your doctor, pharmacist, or health care provider.  2014, Elsevier/Gold Standard. (2008-02-12 11:13:23)

## 2014-02-13 ENCOUNTER — Other Ambulatory Visit: Payer: Self-pay | Admitting: Emergency Medicine

## 2014-02-13 ENCOUNTER — Other Ambulatory Visit: Payer: Self-pay | Admitting: Physician Assistant

## 2014-02-13 LAB — CYCLOSPORINE LEVEL: Cyclosporine, Blood: 306 ng/mL (ref 140–330)

## 2014-02-13 LAB — CEA: CEA: 2.7 ng/mL (ref 0.0–5.0)

## 2014-02-13 NOTE — Progress Notes (Signed)
[name] OFFICE PROGRESS NOTE  [name], [name] Sayre, [name] [address] [place] [id number]  DIAGNOSIS: Leukopenia - Plan: CBC with Differential, Comprehensive metabolic panel (Cmet) - [facility], Lactate dehydrogenase (LDH) - [facility], Cyclosporine level, CBC with Differential, CBC with Differential in 1 month, CBC with Differential in 2 months  Chief Complaint  Patient presents with  . ADENOCARCINOMA, SIGMOID COLON    CURRENT THERAPY: 1. Neupogen 300 mcg subcu, currently 4 times a week. Neupogen was  started in late December 2011. As of 06/14/2013, Neupogen dose will be 300 mcg subcu 3 times weekly.  2. Cyclosporine 125 mg twice daily. Cyclosporine was started in August 2012.  INTERVAL HISTORY: [name] 80 y.o. 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  He was last seen by me on 10/15/2014.  Today, he is accompanied by his daughter [name].  He continues to do well on his current treatment of autoimmune neutropenia.  We have been checking his white count monthly.  He did have a substantial elevation about one month ago.  [name] reports he was given the Neupogen on the morning of his lab draw.  Repeat measurement two weeks later demonstrated a decrease.   He denies any recent hospitalizations or emergency room visits.   His last colonoscopy was on 09/03/2010 and he denies hematochezia or melena.  He is scheduled for follow up with his dermatologist.   MEDICAL HISTORY: Past Medical History  Diagnosis Date  . Aortic root dilatation   . DM2 (diabetes mellitus, type 2)   . Stroke   . HLD (hyperlipidemia)   . HTN (hypertension)   . CAD (coronary artery disease)     Presumed from previously mildy abnormal myoview;  myoview 4/12: EF 73%, no ischemia  . Leukopenia   . Pneumothorax   . Rectal cancer   . Cardiomyopathy     a. echo 5/12: EF 45-50%, mild  LVH, grade 2 diast dysfxn, RAE  . Colon cancer   . Chronic kidney disease     renal insuff, increased uric acid  . Seizures   . Diabetes mellitus   . BPH (benign prostatic hyperplasia)     increased PSA  . Anemia    PROBLEM LIST:  1. 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 The patient had bone marrow on 04/03/2009 and again on 04/08/2010. FISH studies were negative as was the bone marrow. Neupogen was started in late December 2011. Cyclosporine (Gengraf) was started on August 04, 2011.  2. Well-differentiated adenocarcinoma arising in a tubulovillous adenoma, pT1 N0 with 0/13 lymph nodes positive, submucosal invasion positive for lymphovascular invasion but negative for perineural  invasion. The tumor was located in the proximal rectum first detected on colonoscopy on 09/03/2010. Laparoscopic-assisted low anterior resection with a diverting loop ileostomy was carried out  on 12/23/2010. Closure of the loop ileostomy occurred on 04/06/2011. The patient is without evidence of recurrence. Colonoscopy carried out by Dr. Verl Blalock 12/22/2011 was without significant findings.  3. Renal insufficiency.  4. Hyperuricemia.  5. Anemia possibly related to renal insufficiency.  6. Possible seizures dating back to December 2009.  7. History of C difficile diarrhea.  8. Diabetes mellitus.  9. Hypertension.  10. Dyslipidemia.  11. BPH.  12. History of skin cancers.  13. Status post left total hip replacement.  14. Coronary artery disease.  15. History of  recurrent febrile neutropenia with negative cultures  requiring hospital admissions.  INTERIM HISTORY: has ADENOCARCINOMA, SIGMOID COLON; DIABETES MELLITUS-TYPE II; HYPERLIPIDEMIA-MIXED; HYPERTENSION, UNSPECIFIED; CAD, NATIVE VESSEL; ANEMIA, HX OF; Preoperative evaluation to rule out surgical contraindication; Leukopenia; Cardiomyopathy; Autoimmune neutropenia; BPH (benign prostatic hyperplasia); Special screening for malignant  neoplasms, colon; Leucopenia; Personal history of malignant neoplasm of rectum, rectosigmoid junction, and anus; S/P left colectomy; Unspecified deficiency anemia; and History of rectal cancer on his problem list.    ALLERGIES:  has No Known Allergies.  MEDICATIONS: has a current medication list which includes the following prescription(s): allopurinol, alprazolam, aspirin, b-d ins syr microfine 1cc/28g, vitamin d-3, co-enzyme q-10, cyclosporine modified, donepezil, doxazosin, filgrastim, finasteride, fish oil-omega-3 fatty acids, furosemide, insulin regular, lamotrigine, metoprolol succinate, nitroglycerin, one touch ultra test, and pravastatin.  SURGICAL HISTORY:  Past Surgical History  Procedure Laterality Date  . Hip arthroplasty    . Tonsillectomy    . S/p resection of rectal cancer    . Vasectomy    . Ileostomy  12/2010  . Reversal ileostomy  03/2011    REVIEW OF SYSTEMS:   Constitutional: Denies fevers, chills or abnormal weight loss Eyes: Denies blurriness of vision Ears, nose, mouth, throat, and face: Denies mucositis or sore throat Respiratory: Denies cough, dyspnea or wheezes Cardiovascular: Denies palpitation, chest discomfort or lower extremity swelling Gastrointestinal:  Denies nausea, heartburn or change in bowel habits Skin: Denies abnormal skin rashes Lymphatics: Denies new lymphadenopathy or easy bruising Neurological:Denies numbness, tingling or new weaknesses Behavioral/Psych: Mood is stable, no new changes  All other systems were reviewed with the patient and are negative.  PHYSICAL EXAMINATION: ECOG PERFORMANCE STATUS: 0 - Asymptomatic  Blood pressure 166/65, pulse 63, temperature 97.5 F (36.4 C), temperature source Oral, resp. rate 18, height 6\' 2"  (1.88 m), weight 218 lb 4.8 oz (99.02 kg), SpO2 99.00%.  GENERAL:alert, no distress and comfortable; chronically ill appearing.  SKIN: skin color, texture, turgor are normal, no rashes; + whitish skin lesion on  hands bilaterally.   EYES: normal, Conjunctiva are pink and non-injected, sclera clear OROPHARYNX:no exudate, no erythema and lips, buccal mucosa, and tongue normal  NECK: supple, thyroid normal size, non-tender, without nodularity LYMPH:  no palpable lymphadenopathy in the cervical, axillary or supraclavicular LUNGS: clear to auscultation and percussion with normal breathing effort HEART: regular rate & rhythm and no murmurs and trace lower extremity edema bilaterally. ABDOMEN:abdomen soft, non-tender and normal bowel sounds Musculoskeletal:no cyanosis of digits and no clubbing  NEURO: alert & oriented x 3 with fluent speech, no focal motor/sensory deficits  LABORATORY DATA: Results for orders placed in visit on 02/12/14 (from the past 48 hour(s))  CBC WITH DIFFERENTIAL     Status: Abnormal   Collection Time    02/12/14 12:42 PM      Result Value Ref Range   WBC 50.5 (*) 4.0 - 10.3 10e3/uL   NEUT# 45.2 (*) 1.5 - 6.5 10e3/uL   HGB 10.2 (*) 13.0 - 17.1 g/dL   HCT 32.6 (*) 38.4 - 49.9 %   Platelets 249  140 - 400 10e3/uL   MCV 93.6  79.3 - 98.0 fL   MCH 29.2  27.2 - 33.4 pg   MCHC 31.2 (*) 32.0 - 36.0 g/dL   RBC 3.48 (*) 4.20 - 5.82 10e6/uL   RDW 14.6  11.0 - 14.6 %   lymph# 4.3 (*) 0.9 - 3.3 10e3/uL   MONO# 0.6  0.1 - 0.9 10e3/uL   Eosinophils Absolute 0.4  0.0 - 0.5 10e3/uL  Basophils Absolute 0.1  0.0 - 0.1 10e3/uL   NEUT% 89.3 (*) 39.0 - 75.0 %   LYMPH% 8.4 (*) 14.0 - 49.0 %   MONO% 1.3  0.0 - 14.0 %   EOS% 0.8  0.0 - 7.0 %   BASO% 0.2  0.0 - 2.0 %  CEA     Status: None   Collection Time    02/12/14 12:42 PM      Result Value Ref Range   CEA 2.7  0.0 - 5.0 ng/mL    Labs:  Lab Results  Component Value Date   WBC 50.5* 02/12/2014   HGB 10.2* 02/12/2014   HCT 32.6* 02/12/2014   MCV 93.6 02/12/2014   PLT 249 02/12/2014   NEUTROABS 45.2* 02/12/2014      Chemistry      Component Value Date/Time   NA 140 10/15/2013 1331   NA 139 08/01/2012 1448   K 4.6 10/15/2013 1331   K 5.0  08/01/2012 1448   CL 104 02/21/2013 1026   CL 106 08/01/2012 1448   CO2 25 10/15/2013 1331   CO2 27 08/01/2012 1448   BUN 15.6 10/15/2013 1331   BUN 35* 08/01/2012 1448   CREATININE 1.5* 10/15/2013 1331   CREATININE 1.49* 08/01/2012 1448   CREATININE 1.53* 07/20/2012 0836   CREATININE 0.89 07/01/2010 1044   GLU 228* 08/13/2010 1312      Component Value Date/Time   CALCIUM 9.7 10/15/2013 1331   CALCIUM 9.1 08/01/2012 1448   ALKPHOS 149 10/15/2013 1331   ALKPHOS 123* 08/01/2012 1448   AST 19 10/15/2013 1331   AST 16 08/01/2012 1448   ALT 11 10/15/2013 1331   ALT 12 08/01/2012 1448   BILITOT 0.55 10/15/2013 1331   BILITOT 0.4 08/01/2012 1448     CBC:  Recent Labs Lab 02/12/14 1242  WBC 50.5*  NEUTROABS 45.2*  HGB 10.2*  HCT 32.6*  MCV 93.6  PLT 249     Studies:  No results found.   RADIOGRAPHIC STUDIES: No results found.  ASSESSMENT: [name] 80 y.o. male with a history of Leukopenia - Plan: CBC with Differential, Comprehensive metabolic panel (Cmet) - [facility], Lactate dehydrogenase (LDH) - [facility], Cyclosporine level, CBC with Differential, CBC with Differential in 1 month, CBC with Differential in 2 months   PLAN: 1. Autoimmune neutropenia. - He continues to do well overall.  His current dose of neupogen is 300 mcg SQ 3 days weeks on Monday, Wednesday, Friday (recently decreased from 4x per week) in July.  I think given his elevation today and prior elevations, his neupogen should be further titrated down due to elevated WBC.  We instructed him to start neupogen 300 mcg SQ 2 days a week on Monday and Thursday.  --We will continue cyclosporine at the same dose 125 mg twice a day. We will check a cyclosporine level.   2. Sigmoid Colon adenocarcinoma.  - Colonoscopy carried out by Dr. Verl Blalock 12/22/2011 without significant finding. Last CEA level is 1.6. Today, it is 2.6.    3. Anemia.  - Likely related to mild renal insufficiency.  Hemoglobin is 10.2 today.  He is without  symptoms of anemia.   4. Follow-up.  --Continue to check CBCs monthly (biweekly for the first month) and patient instructed to return in 3 months, with a repeat CBC, chemistries, LDH, CEA and a trough cyclosporine level.   All questions were answered. The patient knows to call the clinic with any problems, questions or concerns. We  can certainly see the patient much sooner if necessary.  I spent 15 minutes counseling the patient face to face. The total time spent in the appointment was 25 minutes.    Dejanae Helser, [name] 02/13/2014 8:38 AM

## 2014-02-14 ENCOUNTER — Telehealth: Payer: Self-pay | Admitting: Internal Medicine

## 2014-02-14 NOTE — Telephone Encounter (Signed)
lmonvm advising the pt of his next lab appt in march and to pick up the rest of his appt schedules at that time.

## 2014-02-19 ENCOUNTER — Other Ambulatory Visit: Payer: Self-pay | Admitting: Physician Assistant

## 2014-02-26 ENCOUNTER — Other Ambulatory Visit: Payer: Medicare Other

## 2014-02-26 ENCOUNTER — Encounter: Payer: Self-pay | Admitting: Emergency Medicine

## 2014-02-26 ENCOUNTER — Ambulatory Visit (INDEPENDENT_AMBULATORY_CARE_PROVIDER_SITE_OTHER): Payer: Medicare Other | Admitting: Emergency Medicine

## 2014-02-26 ENCOUNTER — Other Ambulatory Visit: Payer: Self-pay | Admitting: Medical Oncology

## 2014-02-26 VITALS — BP 140/68 | HR 81 | Temp 98.7°F | Resp 16 | Ht 72.0 in | Wt 213.0 lb

## 2014-02-26 DIAGNOSIS — E119 Type 2 diabetes mellitus without complications: Secondary | ICD-10-CM

## 2014-02-26 LAB — GLUCOSE, POCT (MANUAL RESULT ENTRY): POC Glucose: 158 mg/dl — AB (ref 70–99)

## 2014-02-26 LAB — POCT GLYCOSYLATED HEMOGLOBIN (HGB A1C): Hemoglobin A1C: 6.4

## 2014-02-26 NOTE — Progress Notes (Addendum)
  This chart was scribed for [name] Russian, MD by Eston Mould, ED Scribe. This patient was seen in room Room/bed 21 and the patient's care was started at 10:50 AM. Subjective:    Patient ID: [name], male    DOB: 1931/12/18, 80 y.o.   MRN: [id number]  HPI [name] is a 80 y.o. male who presents to the [facility] for DM and HTN F/U. Pt states he has been feeling well lately. He denies having swelling or pain to bilateral feet. Pt was recently seen by Oncologist and GI. Pt denies being seen by Opthamologist yearly.   Review of Systems  Cardiovascular: Negative for chest pain and leg swelling.  Gastrointestinal: Negative for abdominal pain.   Objective:   Physical Exam CONSTITUTIONAL: Well developed/well nourished HEAD: Normocephalic/atraumatic EYES: EOMI/PERRL ENMT: Mucous membranes moist NECK: supple no meningeal signs SPINE:entire spine nontender CV: S1/S2 noted, there is a grade 1-2 systolic murmur at the left sternal border LUNGS: Lungs are clear to auscultation bilaterally, no apparent distress ABDOMEN: soft, nontender, no rebound or guarding GU:no cva tenderness NEURO: Pt is awake/alert, moves all extremitiesx4. Decreased sensation to bilateral feet.Varicose veins to both feet. Neuropathy to bilateral feet. EXTREMITIES: pulses normal, full ROM SKIN: warm, color normal PSYCH: no abnormalities of mood noted  Triage Vitals:BP 140/68  Pulse 81  Temp(Src) 98.7 F (37.1 C) (Oral)  Resp 16  Ht 6' (1.829 m)  Wt 213 lb (96.616 kg)  BMI 28.88 kg/m2  SpO2 95% Assessment & Plan:   Results for orders placed in visit on 02/26/14  GLUCOSE, POCT (MANUAL RESULT ENTRY)      Result Value Ref Range   POC Glucose 158 (*) 70 - 99 mg/dl  POCT GLYCOSYLATED HEMOGLOBIN (HGB A1C)      Result Value Ref Range   Hemoglobin A1C 6.4      I personally performed the services described in this documentation, which was scribed in my presence. The recorded information has been  reviewed and is accurate.

## 2014-02-27 ENCOUNTER — Telehealth: Payer: Self-pay

## 2014-02-27 ENCOUNTER — Telehealth: Payer: Self-pay | Admitting: Internal Medicine

## 2014-02-27 NOTE — Telephone Encounter (Signed)
pt called and gave him appt for lab tomorrow per [facility]

## 2014-02-27 NOTE — Telephone Encounter (Signed)
, °

## 2014-02-27 NOTE — Telephone Encounter (Signed)
S/w Dr Perfecto Kingdom office and they did not draw a CBC. Called pt to let him know he will need labs drawn here. Call transferred to scheduler.

## 2014-02-28 ENCOUNTER — Other Ambulatory Visit: Payer: Medicare Other

## 2014-03-01 ENCOUNTER — Ambulatory Visit (HOSPITAL_BASED_OUTPATIENT_CLINIC_OR_DEPARTMENT_OTHER): Payer: Medicare Other

## 2014-03-01 DIAGNOSIS — D649 Anemia, unspecified: Secondary | ICD-10-CM

## 2014-03-01 DIAGNOSIS — D72819 Decreased white blood cell count, unspecified: Secondary | ICD-10-CM

## 2014-03-01 LAB — CBC WITH DIFFERENTIAL/PLATELET
BASO%: 1 % (ref 0.0–2.0)
BASOS ABS: 0.1 10*3/uL (ref 0.0–0.1)
EOS%: 2.4 % (ref 0.0–7.0)
Eosinophils Absolute: 0.2 10*3/uL (ref 0.0–0.5)
HCT: 32 % — ABNORMAL LOW (ref 38.4–49.9)
HGB: 10.5 g/dL — ABNORMAL LOW (ref 13.0–17.1)
LYMPH%: 29.1 % (ref 14.0–49.0)
MCH: 30.3 pg (ref 27.2–33.4)
MCHC: 32.6 g/dL (ref 32.0–36.0)
MCV: 92.9 fL (ref 79.3–98.0)
MONO#: 0.3 10*3/uL (ref 0.1–0.9)
MONO%: 3.5 % (ref 0.0–14.0)
NEUT#: 5.4 10*3/uL (ref 1.5–6.5)
NEUT%: 64 % (ref 39.0–75.0)
Platelets: 215 10*3/uL (ref 140–400)
RBC: 3.45 10*6/uL — AB (ref 4.20–5.82)
RDW: 14.5 % (ref 11.0–14.6)
WBC: 8.4 10*3/uL (ref 4.0–10.3)
lymph#: 2.5 10*3/uL (ref 0.9–3.3)

## 2014-03-07 ENCOUNTER — Other Ambulatory Visit: Payer: Self-pay | Admitting: Emergency Medicine

## 2014-03-08 ENCOUNTER — Other Ambulatory Visit: Payer: Self-pay | Admitting: Radiology

## 2014-03-08 NOTE — Telephone Encounter (Signed)
Called in.

## 2014-03-11 ENCOUNTER — Other Ambulatory Visit: Payer: Self-pay | Admitting: Internal Medicine

## 2014-03-14 ENCOUNTER — Other Ambulatory Visit (HOSPITAL_BASED_OUTPATIENT_CLINIC_OR_DEPARTMENT_OTHER): Payer: Medicare Other

## 2014-03-14 DIAGNOSIS — D72819 Decreased white blood cell count, unspecified: Secondary | ICD-10-CM

## 2014-03-14 DIAGNOSIS — C187 Malignant neoplasm of sigmoid colon: Secondary | ICD-10-CM

## 2014-03-14 LAB — CBC WITH DIFFERENTIAL/PLATELET
BASO%: 0.7 % (ref 0.0–2.0)
Basophils Absolute: 0.1 10*3/uL (ref 0.0–0.1)
EOS%: 1.3 % (ref 0.0–7.0)
Eosinophils Absolute: 0.1 10*3/uL (ref 0.0–0.5)
HCT: 31.4 % — ABNORMAL LOW (ref 38.4–49.9)
HGB: 10.3 g/dL — ABNORMAL LOW (ref 13.0–17.1)
LYMPH%: 24.5 % (ref 14.0–49.0)
MCH: 30.5 pg (ref 27.2–33.4)
MCHC: 32.6 g/dL (ref 32.0–36.0)
MCV: 93.6 fL (ref 79.3–98.0)
MONO#: 0.3 10*3/uL (ref 0.1–0.9)
MONO%: 3.6 % (ref 0.0–14.0)
NEUT%: 69.9 % (ref 39.0–75.0)
NEUTROS ABS: 6.5 10*3/uL (ref 1.5–6.5)
Platelets: 202 10*3/uL (ref 140–400)
RBC: 3.36 10*6/uL — AB (ref 4.20–5.82)
RDW: 14.6 % (ref 11.0–14.6)
WBC: 9.3 10*3/uL (ref 4.0–10.3)
lymph#: 2.3 10*3/uL (ref 0.9–3.3)

## 2014-03-23 ENCOUNTER — Emergency Department (HOSPITAL_BASED_OUTPATIENT_CLINIC_OR_DEPARTMENT_OTHER): Payer: Medicare Other

## 2014-03-23 ENCOUNTER — Encounter (HOSPITAL_BASED_OUTPATIENT_CLINIC_OR_DEPARTMENT_OTHER): Payer: Self-pay | Admitting: Emergency Medicine

## 2014-03-23 ENCOUNTER — Emergency Department (HOSPITAL_BASED_OUTPATIENT_CLINIC_OR_DEPARTMENT_OTHER)
Admission: EM | Admit: 2014-03-23 | Discharge: 2014-03-23 | Disposition: A | Discharge status: Home / Self Care | Payer: Medicare Other | Attending: Emergency Medicine | Admitting: Emergency Medicine

## 2014-03-23 DIAGNOSIS — N189 Chronic kidney disease, unspecified: Secondary | ICD-10-CM | POA: Insufficient documentation

## 2014-03-23 DIAGNOSIS — G40909 Epilepsy, unspecified, not intractable, without status epilepticus: Secondary | ICD-10-CM | POA: Insufficient documentation

## 2014-03-23 DIAGNOSIS — Y9289 Other specified places as the place of occurrence of the external cause: Secondary | ICD-10-CM | POA: Insufficient documentation

## 2014-03-23 DIAGNOSIS — Z85048 Personal history of other malignant neoplasm of rectum, rectosigmoid junction, and anus: Secondary | ICD-10-CM | POA: Insufficient documentation

## 2014-03-23 DIAGNOSIS — S52513A Displaced fracture of unspecified radial styloid process, initial encounter for closed fracture: Secondary | ICD-10-CM

## 2014-03-23 DIAGNOSIS — Z96649 Presence of unspecified artificial hip joint: Secondary | ICD-10-CM | POA: Insufficient documentation

## 2014-03-23 DIAGNOSIS — Z8673 Personal history of transient ischemic attack (TIA), and cerebral infarction without residual deficits: Secondary | ICD-10-CM | POA: Insufficient documentation

## 2014-03-23 DIAGNOSIS — IMO0002 Reserved for concepts with insufficient information to code with codable children: Secondary | ICD-10-CM | POA: Insufficient documentation

## 2014-03-23 DIAGNOSIS — I129 Hypertensive chronic kidney disease with stage 1 through stage 4 chronic kidney disease, or unspecified chronic kidney disease: Secondary | ICD-10-CM | POA: Insufficient documentation

## 2014-03-23 DIAGNOSIS — Y92009 Unspecified place in unspecified non-institutional (private) residence as the place of occurrence of the external cause: Secondary | ICD-10-CM | POA: Insufficient documentation

## 2014-03-23 DIAGNOSIS — S42293A Other displaced fracture of upper end of unspecified humerus, initial encounter for closed fracture: Secondary | ICD-10-CM | POA: Insufficient documentation

## 2014-03-23 DIAGNOSIS — N400 Enlarged prostate without lower urinary tract symptoms: Secondary | ICD-10-CM | POA: Insufficient documentation

## 2014-03-23 DIAGNOSIS — E785 Hyperlipidemia, unspecified: Secondary | ICD-10-CM | POA: Insufficient documentation

## 2014-03-23 DIAGNOSIS — W010XXA Fall on same level from slipping, tripping and stumbling without subsequent striking against object, initial encounter: Secondary | ICD-10-CM | POA: Insufficient documentation

## 2014-03-23 DIAGNOSIS — Z7982 Long term (current) use of aspirin: Secondary | ICD-10-CM | POA: Insufficient documentation

## 2014-03-23 DIAGNOSIS — Z79899 Other long term (current) drug therapy: Secondary | ICD-10-CM | POA: Insufficient documentation

## 2014-03-23 DIAGNOSIS — S52599A Other fractures of lower end of unspecified radius, initial encounter for closed fracture: Secondary | ICD-10-CM | POA: Insufficient documentation

## 2014-03-23 DIAGNOSIS — Z862 Personal history of diseases of the blood and blood-forming organs and certain disorders involving the immune mechanism: Secondary | ICD-10-CM | POA: Insufficient documentation

## 2014-03-23 DIAGNOSIS — Z8709 Personal history of other diseases of the respiratory system: Secondary | ICD-10-CM | POA: Insufficient documentation

## 2014-03-23 DIAGNOSIS — Z794 Long term (current) use of insulin: Secondary | ICD-10-CM | POA: Insufficient documentation

## 2014-03-23 DIAGNOSIS — Z85038 Personal history of other malignant neoplasm of large intestine: Secondary | ICD-10-CM | POA: Insufficient documentation

## 2014-03-23 DIAGNOSIS — Y9389 Activity, other specified: Secondary | ICD-10-CM | POA: Insufficient documentation

## 2014-03-23 DIAGNOSIS — E119 Type 2 diabetes mellitus without complications: Secondary | ICD-10-CM | POA: Insufficient documentation

## 2014-03-23 DIAGNOSIS — I2510 Atherosclerotic heart disease of native coronary artery without angina pectoris: Secondary | ICD-10-CM | POA: Insufficient documentation

## 2014-03-23 LAB — URINALYSIS, ROUTINE W REFLEX MICROSCOPIC
BILIRUBIN URINE: NEGATIVE
GLUCOSE, UA: NEGATIVE mg/dL
Hgb urine dipstick: NEGATIVE
KETONES UR: NEGATIVE mg/dL
LEUKOCYTES UA: NEGATIVE
Nitrite: NEGATIVE
PH: 6 (ref 5.0–8.0)
PROTEIN: NEGATIVE mg/dL
Specific Gravity, Urine: 1.01 (ref 1.005–1.030)
Urobilinogen, UA: 1 mg/dL (ref 0.0–1.0)

## 2014-03-23 MED ORDER — MORPHINE SULFATE 4 MG/ML IJ SOLN
4.0000 mg | Freq: Once | INTRAMUSCULAR | Status: AC
  Administered 2014-03-23: 4 mg via INTRAVENOUS
  Filled 2014-03-23: qty 1

## 2014-03-23 MED ORDER — ONDANSETRON HCL 4 MG/2ML IJ SOLN
INTRAMUSCULAR | Status: AC
  Administered 2014-03-23: 4 mg via INTRAVENOUS
  Filled 2014-03-23: qty 2

## 2014-03-23 MED ORDER — HYDROCODONE-ACETAMINOPHEN 5-325 MG PO TABS
1.0000 | ORAL_TABLET | ORAL | Status: DC | PRN
Start: 2014-03-23 — End: 2014-11-22

## 2014-03-23 MED ORDER — HYDROMORPHONE HCL PF 1 MG/ML IJ SOLN
0.5000 mg | Freq: Once | INTRAMUSCULAR | Status: AC
  Administered 2014-03-23: 0.5 mg via INTRAVENOUS
  Filled 2014-03-23: qty 1

## 2014-03-23 MED ORDER — HYDROCODONE-ACETAMINOPHEN 5-325 MG PO TABS
ORAL_TABLET | ORAL | Status: AC
  Administered 2014-03-23: 2 via ORAL
  Filled 2014-03-23: qty 2

## 2014-03-23 MED ORDER — HYDROCODONE-ACETAMINOPHEN 5-325 MG PO TABS
2.0000 | ORAL_TABLET | Freq: Once | ORAL | Status: AC
  Administered 2014-03-23: 2 via ORAL

## 2014-03-23 MED ORDER — MORPHINE SULFATE 4 MG/ML IJ SOLN
INTRAMUSCULAR | Status: AC
  Administered 2014-03-23: 4 mg via INTRAVENOUS
  Filled 2014-03-23: qty 1

## 2014-03-23 MED ORDER — ONDANSETRON HCL 4 MG/2ML IJ SOLN
4.0000 mg | Freq: Once | INTRAMUSCULAR | Status: AC
  Administered 2014-03-23: 4 mg via INTRAVENOUS

## 2014-03-23 MED ORDER — MORPHINE SULFATE 4 MG/ML IJ SOLN
4.0000 mg | Freq: Once | INTRAMUSCULAR | Status: AC
  Administered 2014-03-23: 4 mg via INTRAVENOUS

## 2014-03-23 NOTE — Discharge Instructions (Signed)
Cryotherapy Cryotherapy means treatment with cold. Ice or gel packs can be used to reduce both pain and swelling. Ice is the most helpful within the first 24 to 48 hours after an injury or flareup from overusing a muscle or joint. Sprains, strains, spasms, burning pain, shooting pain, and aches can all be eased with ice. Ice can also be used when recovering from surgery. Ice is effective, has very few side effects, and is safe for most people to use. PRECAUTIONS  Ice is not a safe treatment option for people with:  Raynaud's phenomenon. This is a condition affecting small blood vessels in the extremities. Exposure to cold may cause your problems to return.  Cold hypersensitivity. There are many forms of cold hypersensitivity, including:  Cold urticaria. Red, itchy hives appear on the skin when the tissues begin to warm after being iced.  Cold erythema. This is a red, itchy rash caused by exposure to cold.  Cold hemoglobinuria. Red blood cells break down when the tissues begin to warm after being iced. The hemoglobin that carry oxygen are passed into the urine because they cannot combine with blood proteins fast enough.  Numbness or altered sensitivity in the area being iced. If you have any of the following conditions, do not use ice until you have discussed cryotherapy with your caregiver:  Heart conditions, such as arrhythmia, angina, or chronic heart disease.  High blood pressure.  Healing wounds or open skin in the area being iced.  Current infections.  Rheumatoid arthritis.  Poor circulation.  Diabetes. Ice slows the blood flow in the region it is applied. This is beneficial when trying to stop inflamed tissues from spreading irritating chemicals to surrounding tissues. However, if you expose your skin to cold temperatures for too long or without the proper protection, you can damage your skin or nerves. Watch for signs of skin damage due to cold. HOME CARE INSTRUCTIONS Follow  these tips to use ice and cold packs safely.  Place a dry or damp towel between the ice and skin. A damp towel will cool the skin more quickly, so you may need to shorten the time that the ice is used.  For a more rapid response, add gentle compression to the ice.  Ice for no more than 10 to 20 minutes at a time. The bonier the area you are icing, the less time it will take to get the benefits of ice.  Check your skin after 5 minutes to make sure there are no signs of a poor response to cold or skin damage.  Rest 20 minutes or more in between uses.  Once your skin is numb, you can end your treatment. You can test numbness by very lightly touching your skin. The touch should be so light that you do not see the skin dimple from the pressure of your fingertip. When using ice, most people will feel these normal sensations in this order: cold, burning, aching, and numbness.  Do not use ice on someone who cannot communicate their responses to pain, such as small children or people with dementia. HOW TO MAKE AN ICE PACK Ice packs are the most common way to use ice therapy. Other methods include ice massage, ice baths, and cryo-sprays. Muscle creams that cause a cold, tingly feeling do not offer the same benefits that ice offers and should not be used as a substitute unless recommended by your caregiver. To make an ice pack, do one of the following:  Place crushed ice or  a bag of frozen vegetables in a sealable plastic bag. Squeeze out the excess air. Place this bag inside another plastic bag. Slide the bag into a pillowcase or place a damp towel between your skin and the bag.  Mix 3 parts water with 1 part rubbing alcohol. Freeze the mixture in a sealable plastic bag. When you remove the mixture from the freezer, it will be slushy. Squeeze out the excess air. Place this bag inside another plastic bag. Slide the bag into a pillowcase or place a damp towel between your skin and the bag. SEEK MEDICAL  CARE IF:  You develop white spots on your skin. This may give the skin a blotchy (mottled) appearance.  Your skin turns blue or pale.  Your skin becomes waxy or hard.  Your swelling gets worse. MAKE SURE YOU:   Understand these instructions.  Will watch your condition.  Will get help right away if you are not doing well or get worse. Document Released: 07/26/2011 Document Revised: 02/21/2012 Document Reviewed: 07/26/2011 [facility] Patient Information 2014 [place], [place]. Humerus Fracture, Treated with Immobilization The humerus is the large bone in your upper arm. You have a broken (fractured) humerus. These fractures are easily diagnosed with X-rays. TREATMENT  Simple fractures which will heal without disability are treated with simple immobilization. Immobilization means you will wear a cast, splint, or sling. You have a fracture which will do well with immobilization. The fracture will heal well simply by being held in a good position until it is stable enough to begin range of motion exercises. Do not take part in activities which would further injure your arm.  HOME CARE INSTRUCTIONS   Put ice on the injured area.  Put ice in a plastic bag.  Place a towel between your skin and the bag.  Leave the ice on for 15-20 minutes, 03-04 times a day.  If you have a cast:  Do not scratch the skin under the cast using sharp or pointed objects.  Check the skin around the cast every day. You may put lotion on any red or sore areas.  Keep your cast dry and clean.  If you have a splint:  Wear the splint as directed.  Keep your splint dry and clean.  You may loosen the elastic around the splint if your fingers become numb, tingle, or turn cold or blue.  If you have a sling:  Wear the sling as directed.  Do not put pressure on any part of your cast or splint until it is fully hardened.  Your cast or splint can be protected during bathing with a plastic bag. Do not lower the  cast or splint into water.  Only take over-the-counter or prescription medicines for pain, discomfort, or fever as directed by your caregiver.  Do range of motion exercises as instructed by your caregiver.  Follow up as directed by your caregiver. This is very important in order to avoid permanent injury or disability and chronic pain. SEEK IMMEDIATE MEDICAL CARE IF:   Your skin or nails in the injured arm turn blue or gray.  Your arm feels cold or numb.  You develop severe pain in the injured arm.  You are having problems with the medicines you were given. MAKE SURE YOU:   Understand these instructions.  Will watch your condition.  Will get help right away if you are not doing well or get worse. Document Released: 03/07/2001 Document Revised: 02/21/2012 Document Reviewed: 01/13/2011 [facility] Patient Information 2014 [place].

## 2014-03-23 NOTE — ED Notes (Signed)
Patient here by EMS after tripping over garden hose this afternoon in yard, patient fell onto right shoulder into shrub, no loc. Abrasion to forehead, skin tear to right elbow and right knee. Patient complains of severe right shoulder pain, swelling and bruising to same, patient received fentanyl 100 pta.

## 2014-03-23 NOTE — ED Provider Notes (Signed)
CSN: [id number]     Arrival date & time 03/23/14  1442 History   First MD Initiated Contact with Patient 03/23/14 1502     Chief Complaint  Patient presents with  . Fall     (Consider location/radiation/quality/duration/timing/severity/associated sxs/prior Treatment) Patient is a 80 y.o. male presenting with fall. The history is provided by the patient. No language interpreter was used.  Fall This is a new problem. The current episode started today. Pertinent negatives include no abdominal pain, chest pain, chills, fever, headaches or neck pain. Associated symptoms comments: He states he was in his yard and tripped over his garden hose, causing him to fall onto his right side, landing in the hedges. He was unable to get up on his own and laid there an estimated 3-4 hours. No head injury, headache, nausea, vomiting. He complains of severe pain in his right shoulder and wrist. No chest pain, neck or abdominal pain. He reports no pain with movement of lower extremities including hips..    Past Medical History  Diagnosis Date  . Aortic root dilatation   . DM2 (diabetes mellitus, type 2)   . Stroke   . HLD (hyperlipidemia)   . HTN (hypertension)   . CAD (coronary artery disease)     Presumed from previously mildy abnormal myoview;  myoview 4/12: EF 73%, no ischemia  . Leukopenia   . Pneumothorax   . Rectal cancer   . Cardiomyopathy     a. echo 5/12: EF 45-50%, mild LVH, grade 2 diast dysfxn, RAE  . Colon cancer   . Chronic kidney disease     renal insuff, increased uric acid  . Seizures   . Diabetes mellitus   . BPH (benign prostatic hyperplasia)     increased PSA  . Anemia    Past Surgical History  Procedure Laterality Date  . Hip arthroplasty    . Tonsillectomy    . S/p resection of rectal cancer    . Vasectomy    . Ileostomy  12/2010  . Reversal ileostomy  03/2011   Family History  Problem Relation Age of Onset  . Alzheimer's disease    . Parkinsonism Other   .  Parkinsonism Father   . Stomach cancer Neg Hx   . Colon cancer Neg Hx    History  Substance Use Topics  . Smoking status: Never Smoker   . Smokeless tobacco: Never Used  . Alcohol Use: 2.4 oz/week    4 Cans of beer per week    Review of Systems  Constitutional: Negative for fever and chills.  HENT: Negative.   Respiratory: Negative.   Cardiovascular: Negative.  Negative for chest pain.  Gastrointestinal: Negative.  Negative for abdominal pain.  Musculoskeletal: Negative.  Negative for neck pain.       See HPI  Skin:       C/O abrasions to right arm.   Neurological: Negative.  Negative for headaches.      Allergies  Review of patient's allergies indicates no known allergies.  Home Medications   Current Outpatient Rx  Name  Route  Sig  Dispense  Refill  . allopurinol (ZYLOPRIM) 100 MG tablet      TAKE 2 TABLETS TWICE A DAY   120 tablet   3   . ALPRAZolam (XANAX) 0.25 MG tablet      TAKE 1 TABLET AT BEDTIME AS NEEDED   30 tablet   5   . aspirin 81 MG tablet   Oral   Take  81 mg by mouth daily.           . B-D INS SYR MICROFINE 1CC/28G 28G X 1/2" 1 ML MISC      USE AS DIRECTED   1 each   8   . Cholecalciferol (VITAMIN D-3) 5000 UNITS TABS   Oral   Take by mouth daily.          Marland Kitchen co-enzyme Q-10 30 MG capsule   Oral   Take 30 mg by mouth 2 (two) times daily.         . cycloSPORINE modified (NEORAL) 25 MG capsule   Oral   Take 125 mg by mouth 2 (two) times daily. 4 tabs twice daily = 8 tabs total daily         . donepezil (ARICEPT) 5 MG tablet   Oral   Take 5 mg by mouth at bedtime as needed.         . doxazosin (CARDURA) 4 MG tablet   Oral   Take 1 tablet (4 mg total) by mouth at bedtime. PATIENT NEEDS OFFICE VISIT FOR ADDITIONAL REFILLS - 2nd NOTICE   15 tablet   0   . doxazosin (CARDURA) 4 MG tablet   Oral   Take 1 tablet (4 mg total) by mouth daily. PATIENT NEEDS OFFICE VISIT FOR ADDITIONAL REFILLS   30 tablet   1   .  filgrastim (NEUPOGEN) 300 MCG/ML injection      3 times a week or as directed-Mon,Wed and Fri         . finasteride (PROSCAR) 5 MG tablet      TAKE 1 TABLET EVERY DAY   30 tablet   4   . fish oil-omega-3 fatty acids 1000 MG capsule   Oral   Take 2 g by mouth daily.           . furosemide (LASIX) 20 MG tablet      TAKE 1 TABLET (20 MG TOTAL) BY MOUTH DAILY.   30 tablet   10   . insulin regular (HUMULIN R) 100 units/mL injection      Check 30-60 minutes prior to each meal. Give sliding-scale insulin as instructed   20 mL   11   . lamoTRIgine (LAMICTAL) 25 MG tablet   Oral   Take 25 mg by mouth 3 (three) times daily. 2 tabs in the am, 2 tabs at noon, 4 tabs at bedtime         . metoprolol succinate (TOPROL-XL) 25 MG 24 hr tablet   Oral   Take 25 mg by mouth daily. 1/2 tab daily         . nitroGLYCERIN (NITROSTAT) 0.4 MG SL tablet   Sublingual   Place 1 tablet (0.4 mg total) under the tongue every 5 (five) minutes as needed for chest pain.   25 tablet   3   . ONE TOUCH ULTRA TEST test strip      CHECK BLOOD SUGAR 3 TIMES A DAY AS DIRECTED   100 each   8   . pravastatin (PRAVACHOL) 20 MG tablet   Oral   Take 1 tablet (20 mg total) by mouth daily.   90 tablet   4    BP 149/56  Pulse 72  Temp(Src) 97.9 F (36.6 C) (Oral)  Resp 20  SpO2 98% Physical Exam  Constitutional: He is oriented to person, place, and time. He appears well-developed and well-nourished.  HENT:  Head: Normocephalic and atraumatic.  Eyes:  Conjunctivae are normal.  Neck: Normal range of motion. Neck supple.  Cardiovascular: Normal rate and regular rhythm.   Pulmonary/Chest: Effort normal and breath sounds normal. He has no wheezes. He has no rales. He exhibits no tenderness.  Abdominal: Soft. Bowel sounds are normal. There is no tenderness. There is no rebound and no guarding.  Musculoskeletal: Normal range of motion.  Right shoulder markedly swollen and ecchymotic. No tenderness  to elbow or forearm until wrist. Mild swelling of wrist joint with tenderness. Reluctant but able to move fingers of right hand.   Neurological: He is alert and oriented to person, place, and time. Coordination normal.  Skin: Skin is warm and dry. No rash noted.  Superficial abrasion/skin tear to lateral right mid-shaft forearm.   Psychiatric: He has a normal mood and affect.    ED Course  Procedures (including critical care time) Labs Review Labs Reviewed  URINALYSIS, ROUTINE W REFLEX MICROSCOPIC   Results for orders placed during the hospital encounter of 03/23/14  URINALYSIS, ROUTINE W REFLEX MICROSCOPIC      Result Value Ref Range   Color, Urine YELLOW  YELLOW   APPearance CLEAR  CLEAR   Specific Gravity, Urine 1.010  1.005 - 1.030   pH 6.0  5.0 - 8.0   Glucose, UA NEGATIVE  NEGATIVE mg/dL   Hgb urine dipstick NEGATIVE  NEGATIVE   Bilirubin Urine NEGATIVE  NEGATIVE   Ketones, ur NEGATIVE  NEGATIVE mg/dL   Protein, ur NEGATIVE  NEGATIVE mg/dL   Urobilinogen, UA 1.0  0.0 - 1.0 mg/dL   Nitrite NEGATIVE  NEGATIVE   Leukocytes, UA NEGATIVE  NEGATIVE    Imaging Review Dg Shoulder Right  03/23/2014   CLINICAL DATA:  Right shoulder pain after fall.  EXAM: RIGHT SHOULDER - 2+ VIEW  COMPARISON:  None.  FINDINGS: Moderately displaced fracture is seen involving proximal right humeral head. Visualized ribs appear normal. Mild degenerative joint disease is seen involving the right acromioclavicular joint.  IMPRESSION: Moderately displaced proximal right humeral head fracture.   Electronically Signed   By: Roque Lias M.D.   On: 03/23/2014 16:23   Dg Wrist Complete Right  03/23/2014   CLINICAL DATA:  Pain status post fall  EXAM: RIGHT WRIST - COMPLETE 3+ VIEW  COMPARISON:  None.  FINDINGS: A nondisplaced radial styloid fracture with intra-articular extension is appreciated. The bones are osteopenic.  IMPRESSION: Nondisplaced radial styloid fracture.   Electronically Signed   By: Salome Holmes M.D.   On: 03/23/2014 16:39   Dg Pelvis 1-2 Views  03/23/2014   CLINICAL DATA:  Status post fall  EXAM: PELVIS - 1-2 VIEW  COMPARISON:  CT ABD/PELVIS W CM dated 04/29/2011; DG COLON W/CM - WO/W KUB dated 03/11/2011; DG PELVIS PORTABLE dated 05/29/2007  FINDINGS: The patient's left hip arthroplasty toe. No evidence of loose no hardware failure. Native osseous structures intact. Degenerative changes within the lower lumbar spine and sacroiliac joints.  IMPRESSION: No evidence of acute osseous or prosthetic abnormality.   Electronically Signed   By: Salome Holmes M.D.   On: 03/23/2014 17:53   Dg Shoulder Right  03/23/2014   CLINICAL DATA:  Right shoulder pain after fall.  EXAM: RIGHT SHOULDER - 2+ VIEW  COMPARISON:  None.  FINDINGS: Moderately displaced fracture is seen involving proximal right humeral head. Visualized ribs appear normal. Mild degenerative joint disease is seen involving the right acromioclavicular joint.  IMPRESSION: Moderately displaced proximal right humeral head fracture.   Electronically Signed  By: Sabino Dick M.D.   On: 03/23/2014 16:23   Dg Wrist Complete Right  03/23/2014   CLINICAL DATA:  Pain status post fall  EXAM: RIGHT WRIST - COMPLETE 3+ VIEW  COMPARISON:  None.  FINDINGS: A nondisplaced radial styloid fracture with intra-articular extension is appreciated. The bones are osteopenic.  IMPRESSION: Nondisplaced radial styloid fracture.   Electronically Signed   By: Margaree Mackintosh M.D.   On: 03/23/2014 16:39    EKG Interpretation None      MDM   Final diagnoses:  None    1. Right humerus fracture 2. Right radial styloid fracture 3. Fall  Closed fracture injuries limited to right upper extremities. He can be discharged home and is appropriate for outpatient orthopedic follow up.    Dewaine Oats, PA-C 03/23/14 1809

## 2014-03-23 NOTE — ED Notes (Signed)
Patient transported to X-ray 

## 2014-03-23 NOTE — ED Provider Notes (Signed)
Medical screening examination/treatment/procedure(s) were conducted as a shared visit with non-physician practitioner(s) and myself.  I personally evaluated the patient during the encounter.   EKG Interpretation None     Pt presenting after fall, has proximal humerus fracture, radial styloid fracture, hematoma overlying right iliac crest no abdominal pain or tenderness on exam. Pelvis stable.  Xray reassuring.  Placed in sling, splint on wrist- pain control and followup with orthopedics.  Discharged with strict return precautions.  Pt agreeable with plan.  Threasa Beards, MD 03/23/14 [phone]

## 2014-03-25 ENCOUNTER — Encounter (INDEPENDENT_AMBULATORY_CARE_PROVIDER_SITE_OTHER): Payer: Medicare Other | Admitting: Surgery

## 2014-03-25 ENCOUNTER — Telehealth: Payer: Self-pay | Admitting: *Deleted

## 2014-03-25 NOTE — Telephone Encounter (Signed)
Pt is at the ortho dr this afternoon. [name] (pt son) is asking for a home health nurse/home PT to help pt at home. Is this something we can order? I have advised son to ask Ortho Dr. Since they are there right now. Son would like a call from Dr. Everlene Farrier to go over a few other concerns. He did not want to go into detail with me.

## 2014-03-25 NOTE — Telephone Encounter (Signed)
Pt's son, [name] called. His father is a patient of Dr. Perfecto Kingdom. He broke his arm over the weekend & was seen in the ER and treated with a sling and pain medication. The ER doctor told him that he needs to follow-up with a orthopedic doctor. His son states that his father is having a hard time getting up and walking because he has been sitting for so long. He is unsure of whether he needs to bring him to see Dr. Everlene Farrier or if he needs to have him transported to the hospital, please call  [phone]

## 2014-03-26 ENCOUNTER — Ambulatory Visit
Admission: RE | Admit: 2014-03-26 | Discharge: 2014-03-26 | Disposition: A | Discharge status: Home / Self Care | Payer: Medicare Other | Source: Ambulatory Visit | Attending: Orthopedic Surgery | Admitting: Orthopedic Surgery

## 2014-03-26 ENCOUNTER — Other Ambulatory Visit: Payer: Self-pay | Admitting: Orthopedic Surgery

## 2014-03-26 DIAGNOSIS — T148XXA Other injury of unspecified body region, initial encounter: Secondary | ICD-10-CM

## 2014-03-26 DIAGNOSIS — R52 Pain, unspecified: Secondary | ICD-10-CM

## 2014-03-26 NOTE — Telephone Encounter (Signed)
Spoke to [place], the orthopedist is Dr French Ana. Patient is having a CT scan today. Patient is feeling better currently. He is getting around more today. Advised son a safety eval can be ordered if needed. He will call me back if he needs anything.

## 2014-03-30 ENCOUNTER — Encounter: Payer: Self-pay | Admitting: Emergency Medicine

## 2014-03-30 ENCOUNTER — Ambulatory Visit (INDEPENDENT_AMBULATORY_CARE_PROVIDER_SITE_OTHER): Payer: Medicare Other | Admitting: Emergency Medicine

## 2014-03-30 ENCOUNTER — Ambulatory Visit: Payer: Medicare Other

## 2014-03-30 VITALS — BP 148/88 | HR 81 | Temp 98.0°F | Resp 16

## 2014-03-30 DIAGNOSIS — E119 Type 2 diabetes mellitus without complications: Secondary | ICD-10-CM

## 2014-03-30 DIAGNOSIS — M25529 Pain in unspecified elbow: Secondary | ICD-10-CM

## 2014-03-30 DIAGNOSIS — D72829 Elevated white blood cell count, unspecified: Secondary | ICD-10-CM

## 2014-03-30 DIAGNOSIS — M7989 Other specified soft tissue disorders: Secondary | ICD-10-CM

## 2014-03-30 DIAGNOSIS — M25521 Pain in right elbow: Secondary | ICD-10-CM

## 2014-03-30 DIAGNOSIS — R627 Adult failure to thrive: Secondary | ICD-10-CM

## 2014-03-30 LAB — POCT CBC
GRANULOCYTE PERCENT: 90 % — AB (ref 37–80)
HCT, POC: 25.6 % — AB (ref 43.5–53.7)
Hemoglobin: 8 g/dL — AB (ref 14.1–18.1)
Lymph, poc: 2.4 (ref 0.6–3.4)
MCH, POC: 29.4 pg (ref 27–31.2)
MCHC: 31.3 g/dL — AB (ref 31.8–35.4)
MCV: 94.2 fL (ref 80–97)
MID (CBC): 0.8 (ref 0–0.9)
MPV: 7.8 fL (ref 0–99.8)
PLATELET COUNT, POC: 337 10*3/uL (ref 142–424)
POC GRANULOCYTE: 29 — AB (ref 2–6.9)
POC LYMPH %: 7.5 % — AB (ref 10–50)
POC MID %: 2.5 %M (ref 0–12)
RBC: 2.72 M/uL — AB (ref 4.69–6.13)
RDW, POC: 15.3 %
WBC: 32.2 10*3/uL — AB (ref 4.6–10.2)

## 2014-03-30 LAB — COMPREHENSIVE METABOLIC PANEL
ALBUMIN: 3.5 g/dL (ref 3.5–5.2)
ALK PHOS: 127 U/L — AB (ref 39–117)
ALT: 8 U/L (ref 0–53)
AST: 13 U/L (ref 0–37)
BILIRUBIN TOTAL: 1.5 mg/dL — AB (ref 0.2–1.2)
BUN: 40 mg/dL — ABNORMAL HIGH (ref 6–23)
CO2: 24 mEq/L (ref 19–32)
Calcium: 8.5 mg/dL (ref 8.4–10.5)
Chloride: 98 mEq/L (ref 96–112)
Creat: 2.47 mg/dL — ABNORMAL HIGH (ref 0.50–1.35)
Glucose, Bld: 176 mg/dL — ABNORMAL HIGH (ref 70–99)
POTASSIUM: 5 meq/L (ref 3.5–5.3)
SODIUM: 133 meq/L — AB (ref 135–145)
TOTAL PROTEIN: 6.1 g/dL (ref 6.0–8.3)

## 2014-03-30 LAB — GLUCOSE, POCT (MANUAL RESULT ENTRY): POC GLUCOSE: 204 mg/dL — AB (ref 70–99)

## 2014-03-30 MED ORDER — OXYCODONE-ACETAMINOPHEN 5-325 MG PO TABS: 1.0000 | ORAL_TABLET | Freq: Four times a day (QID) | ORAL | Status: DC | PRN

## 2014-03-30 NOTE — Progress Notes (Signed)
Subjective:    Patient ID: [name], male    DOB: [birth year], 80 y.o.   MRN: [id number]  HPI Chief Complaint  Patient presents with  . follow up broken arm  . skin laceration check  . Constipation     no bm over 1 week   . Foot Swelling    This chart was scribed for [name] by Thea Alken, ED Scribe. This patient was seen in room 1 and the patient's care was started at 10:44 AM.  HPI Comments: [name] is a 80 y.o. male brought in by his daughter who presents to the [facility] complaining of multiple complaints. She reports pt feet are swollen bilaterally. She reports that she has tried elevating pt feet without relief to swelling Pt is in a wheel chair and has difficulty walking. She reports that pt does not exercise or move around.She reports that pt has been taking his pain medication for swollen feet twice a day. She reports once at 9 in the morning and another at 1. She states that pt is on percocet's  Pt daughter reports sugar has been running in the 200's. Pt has broken right arm due to a fall.  Pt daughter reports that pt was supposed to have surgery to right shoulder but due to DM and leukopenia he will not be having surgery.   She reports laceration to right elbow. She reports that she has not changed the bandages today. She is concerned about infection to laceration.   She reports that pt is constipation onset 1 week. She states last BM was one week ago. . She reports that pt has taken 3 laxities 1 day ago.    Past Medical History  Diagnosis Date  . Aortic root dilatation   . DM2 (diabetes mellitus, type 2)   . Stroke   . HLD (hyperlipidemia)   . HTN (hypertension)   . CAD (coronary artery disease)     Presumed from previously mildy abnormal myoview;  myoview 4/12: EF 73%, no ischemia  . Leukopenia   . Pneumothorax   . Rectal cancer   . Cardiomyopathy     a. echo 5/12: EF 45-50%, mild LVH, grade 2 diast dysfxn, RAE  . Colon  cancer   . Chronic kidney disease     renal insuff, increased uric acid  . Seizures   . Diabetes mellitus   . BPH (benign prostatic hyperplasia)     increased PSA  . Anemia    No Known Allergies Prior to Admission medications   Medication Sig Start Date End Date Taking? Authorizing Provider  allopurinol (ZYLOPRIM) 100 MG tablet TAKE 2 TABLETS TWICE A DAY 03/11/14  Yes Concha Norway, MD  ALPRAZolam Duanne Moron) 0.25 MG tablet TAKE 1 TABLET AT BEDTIME AS NEEDED   Yes [name], MD  amLODipine (NORVASC) 5 MG tablet  03/17/14  Yes Historical Provider, MD  aspirin 81 MG tablet Take 81 mg by mouth daily.     Yes Historical Provider, MD  B-D INS SYR MICROFINE 1CC/28G 28G X 1/2" 1 ML MISC USE AS DIRECTED 12/06/13  Yes Heather M Marte, PA-C  cycloSPORINE modified (NEORAL) 25 MG capsule Take 125 mg by mouth 2 (two) times daily. 4 tabs twice daily = 8 tabs total daily 06/21/12  Yes Haynes Bast Murinson, MD  donepezil (ARICEPT) 5 MG tablet Take 5 mg by mouth at bedtime as needed.   Yes Historical Provider, MD  doxazosin (CARDURA) 4  MG tablet Take 1 tablet (4 mg total) by mouth at bedtime. PATIENT NEEDS OFFICE VISIT FOR ADDITIONAL REFILLS - 2nd NOTICE 08/30/13  Yes [name], MD  doxazosin (CARDURA) 4 MG tablet Take 1 tablet (4 mg total) by mouth daily. PATIENT NEEDS OFFICE VISIT FOR ADDITIONAL REFILLS   Yes [name], MD  filgrastim (NEUPOGEN) 300 MCG/ML injection 3 times a week or as directed-Mon,Wed and Fri 04/27/13  Yes Haynes Bast Murinson, MD  finasteride (PROSCAR) 5 MG tablet TAKE 1 TABLET EVERY DAY 12/10/13  Yes [name], MD  fish oil-omega-3 fatty acids 1000 MG capsule Take 2 g by mouth daily.     Yes Historical Provider, MD  furosemide (LASIX) 20 MG tablet TAKE 1 TABLET (20 MG TOTAL) BY MOUTH DAILY. 08/22/13  Yes Lelon Perla, MD  insulin regular (HUMULIN R) 100 units/mL injection Check 30-60 minutes prior to each meal. Give sliding-scale insulin as instructed 10/23/13  Yes [name], MD    lamoTRIgine (LAMICTAL) 25 MG tablet Take 25 mg by mouth 3 (three) times daily. 2 tabs in the am, 2 tabs at noon, 4 tabs at bedtime   Yes Historical Provider, MD  metoprolol succinate (TOPROL-XL) 25 MG 24 hr tablet Take 25 mg by mouth daily. 1/2 tab daily   Yes Historical Provider, MD  nitroGLYCERIN (NITROSTAT) 0.4 MG SL tablet Place 1 tablet (0.4 mg total) under the tongue every 5 (five) minutes as needed for chest pain. 09/17/13  Yes Lelon Perla, MD  ONE TOUCH ULTRA TEST test strip CHECK BLOOD SUGAR 3 TIMES A DAY AS DIRECTED 03/12/13  Yes [name], MD  oxyCODONE-acetaminophen (PERCOCET/ROXICET) 5-325 MG per tablet Take 1 tablet by mouth every 6 (six) hours as needed for severe pain.   Yes Historical Provider, MD  pravastatin (PRAVACHOL) 20 MG tablet Take 1 tablet (20 mg total) by mouth daily. 05/31/12  Yes Lelon Perla, MD  Cholecalciferol (VITAMIN D-3) 5000 UNITS TABS Take by mouth daily.     Historical Provider, MD  co-enzyme Q-10 30 MG capsule Take 30 mg by mouth 2 (two) times daily.    Historical Provider, MD  HYDROcodone-acetaminophen (NORCO/VICODIN) 5-325 MG per tablet Take 1-2 tablets by mouth every 4 (four) hours as needed. 03/23/14   Dewaine Oats, PA-C   Review of Systems  Gastrointestinal: Positive for constipation.  Musculoskeletal: Positive for gait problem and myalgias.  Skin: Positive for wound.       Objective:   Physical Exam CONSTITUTIONAL: This is an elderly frail male. He appears to be medicated to HEAD: Normocephalic/atraumatic EYES: Pupils are miotic. ENMT: Mucous membranes moist NECK: supple no meningeal signs SPINE:entire spine nontender CV: S1/S2 noted, no murmurs/rubs/gallops noted LUNGS: Lungs are clear to auscultation bilaterally, no apparent distress ABDOMEN: soft, nontender, no rebound or guarding GU:no cva tenderness NEURO: Pt is awake/alert, moves all extremitiesx4 EXTREMITIES patient is lying on the right arm. There is significant swelling  and ecchymoses over the shoulder and entire upper arm extending down to the elbow. There is swelling and tenderness also over the right wrist. There is pain with any movement of the right shoulder. There are abrasions present over the distal right arm and also over the elbow which do not appear infected .:  SKIN: warm,  PSYCH: no abnormalities of mood noted [facility] reading (PRIMARY) by  Dr. Everlene Farrier no definite fracture seen. There are spurs noted. Films were difficult to obtain secondary to the known humeral head fracture. And  also he has an underlying wrist fracture Results for orders placed in visit on 03/30/14  POCT CBC      Result Value Ref Range   WBC 32.2 (*) 4.6 - 10.2 K/uL   Lymph, poc 2.4  0.6 - 3.4   POC LYMPH PERCENT 7.5 (*) 10 - 50 %L   MID (cbc) 0.8  0 - 0.9   POC MID % 2.5  0 - 12 %M   POC Granulocyte 29.0 (*) 2 - 6.9   Granulocyte percent 90.0 (*) 37 - 80 %G   RBC 2.72 (*) 4.69 - 6.13 M/uL   Hemoglobin 8.0 (*) 14.1 - 18.1 g/dL   HCT, POC 25.6 (*) 43.5 - 53.7 %   MCV 94.2  80 - 97 fL   MCH, POC 29.4  27 - 31.2 pg   MCHC 31.3 (*) 31.8 - 35.4 g/dL   RDW, POC 15.3     Platelet Count, POC 337  142 - 424 K/uL   MPV 7.8  0 - 99.8 fL  GLUCOSE, POCT (MANUAL RESULT ENTRY)      Result Value Ref Range   POC Glucose 204 (*) 70 - 99 mg/dl        Assessment & Plan:  Sugar level is acceptable at 204. His white count is 32,000 however he received his Neupogen shot yesterday. His hemoglobin is down to 8 and I suspect secondary to all of the bleeding he is experiencing in his right shoulder. I suspect a lot of the peripheral edema he has is secondary to his anemia and having his legs dependent. I've encouraged his daughter to have him wear support stockings. I have encouraged her to keep his legs elevated. I encouraged her to call and see if she could get some home health caregivers who can give her some help at home . I would prefer to keep his sugar on the high end. He will continue his  followup with Dr. French Ana. Repeat CBC in one week either here or with oncology. I personally performed the services described in this documentation, which was scribed in my presence. The recorded information has been reviewed and is accurate. I did refill his Percocet prescription because they could not get by Dr. Alvester Morin office yesterday to get it picked up . Home health referral made .

## 2014-03-30 NOTE — Patient Instructions (Signed)
Please have another CBC done next week at the oncology center. These keep your followup with Dr. French Ana.

## 2014-03-31 ENCOUNTER — Other Ambulatory Visit: Payer: Self-pay | Admitting: Internal Medicine

## 2014-04-01 ENCOUNTER — Ambulatory Visit (INDEPENDENT_AMBULATORY_CARE_PROVIDER_SITE_OTHER): Payer: Medicare Other | Admitting: Emergency Medicine

## 2014-04-01 ENCOUNTER — Encounter: Payer: Self-pay | Admitting: Emergency Medicine

## 2014-04-01 ENCOUNTER — Telehealth: Payer: Self-pay

## 2014-04-01 VITALS — BP 142/62 | HR 80 | Temp 98.4°F | Resp 16

## 2014-04-01 DIAGNOSIS — D649 Anemia, unspecified: Secondary | ICD-10-CM

## 2014-04-01 DIAGNOSIS — S41109A Unspecified open wound of unspecified upper arm, initial encounter: Secondary | ICD-10-CM

## 2014-04-01 DIAGNOSIS — S4290XA Fracture of unspecified shoulder girdle, part unspecified, initial encounter for closed fracture: Secondary | ICD-10-CM

## 2014-04-01 DIAGNOSIS — S42209A Unspecified fracture of upper end of unspecified humerus, initial encounter for closed fracture: Secondary | ICD-10-CM

## 2014-04-01 DIAGNOSIS — D72829 Elevated white blood cell count, unspecified: Secondary | ICD-10-CM

## 2014-04-01 DIAGNOSIS — N289 Disorder of kidney and ureter, unspecified: Secondary | ICD-10-CM

## 2014-04-01 LAB — POCT CBC
GRANULOCYTE PERCENT: 77.4 % (ref 37–80)
HEMATOCRIT: 28.5 % — AB (ref 43.5–53.7)
HEMOGLOBIN: 8.6 g/dL — AB (ref 14.1–18.1)
Lymph, poc: 2.1 (ref 0.6–3.4)
MCH, POC: 28.5 pg (ref 27–31.2)
MCHC: 30.2 g/dL — AB (ref 31.8–35.4)
MCV: 94.5 fL (ref 80–97)
MID (cbc): 0.4 (ref 0–0.9)
MPV: 7.9 fL (ref 0–99.8)
POC GRANULOCYTE: 8.7 — AB (ref 2–6.9)
POC LYMPH PERCENT: 19.1 %L (ref 10–50)
POC MID %: 3.5 % (ref 0–12)
Platelet Count, POC: 399 10*3/uL (ref 142–424)
RBC: 3.02 M/uL — AB (ref 4.69–6.13)
RDW, POC: 15.1 %
WBC: 11.2 10*3/uL — AB (ref 4.6–10.2)

## 2014-04-01 LAB — BASIC METABOLIC PANEL WITH GFR
BUN: 45 mg/dL — ABNORMAL HIGH (ref 6–23)
CALCIUM: 8.7 mg/dL (ref 8.4–10.5)
CO2: 26 meq/L (ref 19–32)
Chloride: 97 mEq/L (ref 96–112)
Creat: 2.18 mg/dL — ABNORMAL HIGH (ref 0.50–1.35)
GFR, Est African American: 32 mL/min — ABNORMAL LOW
GFR, Est Non African American: 28 mL/min — ABNORMAL LOW
GLUCOSE: 211 mg/dL — AB (ref 70–99)
POTASSIUM: 5.4 meq/L — AB (ref 3.5–5.3)
SODIUM: 133 meq/L — AB (ref 135–145)

## 2014-04-01 NOTE — Telephone Encounter (Signed)
Spoke with pt's daughter [name], let her know that pt's kidney functions are essentially the same, there is no change in medications, and that Dr. Everlene Farrier wants to recheck her in one week. Pt is not on any potassium.

## 2014-04-02 NOTE — Progress Notes (Signed)
   Subjective:    Patient ID: [name], male    DOB: 1932-08-07, 81 y.o.   MRN: [id number]  HPI patient seen for followup of wounds of his right arm. He was asked to come in because he had a significantly elevated white count and decline in renal function. Requested to come in so we could repeat these values. He has felt better since his last visit to he has been urinating her normal amount. The swelling in his legs is persistent but not as bad as previous    Review of Systems     Objective:   Physical Exam [name] is awake and alert today. His chest was clear his heart was regular without murmurs extremity exam reveals 1+ edema of the lower extremities. He has significant bruising and ecchymoses over the entire right arm elbow extending down to the right wrist where he is wearing a wrist splint. The wounds over his right arm showed decreased redness decreased exudate Results for orders placed in visit on 12/87/86  BASIC METABOLIC PANEL WITH GFR      Result Value Ref Range   Sodium 133 (*) 135 - 145 mEq/L   Potassium 5.4 (*) 3.5 - 5.3 mEq/L   Chloride 97  96 - 112 mEq/L   CO2 26  19 - 32 mEq/L   Glucose, Bld 211 (*) 70 - 99 mg/dL   BUN 45 (*) 6 - 23 mg/dL   Creat 2.18 (*) 0.50 - 1.35 mg/dL   Calcium 8.7  8.4 - 10.5 mg/dL   GFR, Est African American 32 (*)    GFR, Est Non African American 28 (*)   POCT CBC      Result Value Ref Range   WBC 11.2 (*) 4.6 - 10.2 K/uL   Lymph, poc 2.1  0.6 - 3.4   POC LYMPH PERCENT 19.1  10 - 50 %L   MID (cbc) 0.4  0 - 0.9   POC MID % 3.5  0 - 12 %M   POC Granulocyte 8.7 (*) 2 - 6.9   Granulocyte percent 77.4  37 - 80 %G   RBC 3.02 (*) 4.69 - 6.13 M/uL   Hemoglobin 8.6 (*) 14.1 - 18.1 g/dL   HCT, POC 28.5 (*) 43.5 - 53.7 %   MCV 94.5  80 - 97 fL   MCH, POC 28.5  27 - 31.2 pg   MCHC 30.2 (*) 31.8 - 35.4 g/dL   RDW, POC 15.1     Platelet Count, POC 399  142 - 424 K/uL   MPV 7.9  0 - 99.8 fL        Assessment & Plan:  Overall [name] looks  better. His white count is down to 11,000. His hemoglobin has risen to 8.6 from a level of 8. His kidney function remains abnormal with a BUN of 45 and a creatinine of 2.18. This gives an estimated filtration rate of 28. We'll go ahead and repeat this in one week. He is encouraged to force fluids. I advised them to keep close watch on his urinary output. If he starts to have more swelling or the family notices any decrease in urinary output they are to call me. He has a followup appointment regarding his shoulder fracture and wrist fracture with Dr. French Ana on Thursday. Blood work will be repeated one week.

## 2014-04-03 ENCOUNTER — Other Ambulatory Visit: Payer: Self-pay | Admitting: Internal Medicine

## 2014-04-08 ENCOUNTER — Other Ambulatory Visit (INDEPENDENT_AMBULATORY_CARE_PROVIDER_SITE_OTHER): Payer: Medicare Other | Admitting: *Deleted

## 2014-04-08 ENCOUNTER — Other Ambulatory Visit: Payer: Self-pay | Admitting: Physician Assistant

## 2014-04-08 DIAGNOSIS — E875 Hyperkalemia: Secondary | ICD-10-CM

## 2014-04-08 NOTE — Progress Notes (Signed)
PT HERE FOR LABS ONLY VISIT

## 2014-04-09 LAB — BASIC METABOLIC PANEL
BUN: 27 mg/dL — ABNORMAL HIGH (ref 6–23)
CO2: 24 mEq/L (ref 19–32)
Calcium: 8.9 mg/dL (ref 8.4–10.5)
Chloride: 97 mEq/L (ref 96–112)
Creat: 1.74 mg/dL — ABNORMAL HIGH (ref 0.50–1.35)
GLUCOSE: 225 mg/dL — AB (ref 70–99)
POTASSIUM: 5 meq/L (ref 3.5–5.3)
Sodium: 136 mEq/L (ref 135–145)

## 2014-04-15 ENCOUNTER — Other Ambulatory Visit (HOSPITAL_BASED_OUTPATIENT_CLINIC_OR_DEPARTMENT_OTHER): Payer: Medicare Other

## 2014-04-15 DIAGNOSIS — D72819 Decreased white blood cell count, unspecified: Secondary | ICD-10-CM

## 2014-04-15 LAB — CBC WITH DIFFERENTIAL/PLATELET
BASO%: 0.3 % (ref 0.0–2.0)
Basophils Absolute: 0 10*3/uL (ref 0.0–0.1)
EOS ABS: 0.2 10*3/uL (ref 0.0–0.5)
EOS%: 1.7 % (ref 0.0–7.0)
HEMATOCRIT: 30 % — AB (ref 38.4–49.9)
HGB: 9.6 g/dL — ABNORMAL LOW (ref 13.0–17.1)
LYMPH%: 20.1 % (ref 14.0–49.0)
MCH: 29.7 pg (ref 27.2–33.4)
MCHC: 32 g/dL (ref 32.0–36.0)
MCV: 92.9 fL (ref 79.3–98.0)
MONO#: 0.5 10*3/uL (ref 0.1–0.9)
MONO%: 4.4 % (ref 0.0–14.0)
NEUT%: 73.5 % (ref 39.0–75.0)
NEUTROS ABS: 7.9 10*3/uL — AB (ref 1.5–6.5)
Platelets: 277 10*3/uL (ref 140–400)
RBC: 3.23 10*6/uL — ABNORMAL LOW (ref 4.20–5.82)
RDW: 14.6 % (ref 11.0–14.6)
WBC: 10.8 10*3/uL — AB (ref 4.0–10.3)
lymph#: 2.2 10*3/uL (ref 0.9–3.3)

## 2014-04-16 ENCOUNTER — Other Ambulatory Visit: Payer: Self-pay | Admitting: Emergency Medicine

## 2014-04-26 ENCOUNTER — Other Ambulatory Visit: Payer: Self-pay | Admitting: Physician Assistant

## 2014-05-02 ENCOUNTER — Other Ambulatory Visit: Payer: Self-pay | Admitting: Emergency Medicine

## 2014-05-03 ENCOUNTER — Other Ambulatory Visit: Payer: Self-pay | Admitting: *Deleted

## 2014-05-03 ENCOUNTER — Encounter: Payer: Self-pay | Admitting: Internal Medicine

## 2014-05-03 DIAGNOSIS — C187 Malignant neoplasm of sigmoid colon: Secondary | ICD-10-CM

## 2014-05-03 MED ORDER — FILGRASTIM 300 MCG/ML IJ SOLN: 300.0000 ug | INTRAMUSCULAR | Status: DC

## 2014-05-03 NOTE — Progress Notes (Signed)
PRIOR AUTH WAS APPROVED FOR HIS NEUPOGEN 300 MCG FROM 05/03/14 TO 05/04/15 PER NISHA @ [facility] [phone]. [id number].

## 2014-05-07 ENCOUNTER — Other Ambulatory Visit: Payer: Self-pay | Admitting: Emergency Medicine

## 2014-05-09 ENCOUNTER — Other Ambulatory Visit: Payer: Self-pay | Admitting: Internal Medicine

## 2014-05-09 ENCOUNTER — Other Ambulatory Visit: Payer: Self-pay | Admitting: Emergency Medicine

## 2014-05-17 ENCOUNTER — Other Ambulatory Visit (HOSPITAL_BASED_OUTPATIENT_CLINIC_OR_DEPARTMENT_OTHER): Payer: Medicare Other

## 2014-05-17 ENCOUNTER — Ambulatory Visit (HOSPITAL_BASED_OUTPATIENT_CLINIC_OR_DEPARTMENT_OTHER): Payer: Medicare Other | Admitting: Internal Medicine

## 2014-05-17 ENCOUNTER — Telehealth: Payer: Self-pay

## 2014-05-17 ENCOUNTER — Other Ambulatory Visit: Payer: Self-pay | Admitting: Physician Assistant

## 2014-05-17 VITALS — BP 137/58 | HR 68 | Temp 97.7°F | Resp 18 | Ht 72.0 in | Wt 206.5 lb

## 2014-05-17 DIAGNOSIS — D708 Other neutropenia: Secondary | ICD-10-CM

## 2014-05-17 DIAGNOSIS — Z9049 Acquired absence of other specified parts of digestive tract: Secondary | ICD-10-CM

## 2014-05-17 DIAGNOSIS — D72819 Decreased white blood cell count, unspecified: Secondary | ICD-10-CM

## 2014-05-17 DIAGNOSIS — N289 Disorder of kidney and ureter, unspecified: Secondary | ICD-10-CM

## 2014-05-17 DIAGNOSIS — C187 Malignant neoplasm of sigmoid colon: Secondary | ICD-10-CM

## 2014-05-17 DIAGNOSIS — Z85048 Personal history of other malignant neoplasm of rectum, rectosigmoid junction, and anus: Secondary | ICD-10-CM

## 2014-05-17 DIAGNOSIS — D649 Anemia, unspecified: Secondary | ICD-10-CM

## 2014-05-17 DIAGNOSIS — Z862 Personal history of diseases of the blood and blood-forming organs and certain disorders involving the immune mechanism: Secondary | ICD-10-CM

## 2014-05-17 LAB — CBC WITH DIFFERENTIAL/PLATELET
BASO%: 0.5 % (ref 0.0–2.0)
Basophils Absolute: 0 10*3/uL (ref 0.0–0.1)
EOS%: 2.5 % (ref 0.0–7.0)
Eosinophils Absolute: 0.2 10*3/uL (ref 0.0–0.5)
HEMATOCRIT: 33.3 % — AB (ref 38.4–49.9)
HGB: 10.9 g/dL — ABNORMAL LOW (ref 13.0–17.1)
LYMPH%: 28.4 % (ref 14.0–49.0)
MCH: 29.5 pg (ref 27.2–33.4)
MCHC: 32.7 g/dL (ref 32.0–36.0)
MCV: 90.2 fL (ref 79.3–98.0)
MONO#: 0.4 10*3/uL (ref 0.1–0.9)
MONO%: 4.4 % (ref 0.0–14.0)
NEUT#: 5.5 10*3/uL (ref 1.5–6.5)
NEUT%: 64.2 % (ref 39.0–75.0)
Platelets: 235 10*3/uL (ref 140–400)
RBC: 3.69 10*6/uL — ABNORMAL LOW (ref 4.20–5.82)
RDW: 13.7 % (ref 11.0–14.6)
WBC: 8.5 10*3/uL (ref 4.0–10.3)
lymph#: 2.4 10*3/uL (ref 0.9–3.3)

## 2014-05-17 LAB — COMPREHENSIVE METABOLIC PANEL (CC13)
ALT: 10 U/L (ref 0–55)
ANION GAP: 12 meq/L — AB (ref 3–11)
AST: 12 U/L (ref 5–34)
Albumin: 3.8 g/dL (ref 3.5–5.0)
Alkaline Phosphatase: 148 U/L (ref 40–150)
BILIRUBIN TOTAL: 0.61 mg/dL (ref 0.20–1.20)
BUN: 19.6 mg/dL (ref 7.0–26.0)
CALCIUM: 9 mg/dL (ref 8.4–10.4)
CO2: 23 meq/L (ref 22–29)
Chloride: 102 mEq/L (ref 98–109)
Creatinine: 1.8 mg/dL — ABNORMAL HIGH (ref 0.7–1.3)
Glucose: 215 mg/dl — ABNORMAL HIGH (ref 70–140)
Potassium: 4.4 mEq/L (ref 3.5–5.1)
Sodium: 137 mEq/L (ref 136–145)
TOTAL PROTEIN: 6.9 g/dL (ref 6.4–8.3)

## 2014-05-17 LAB — CEA: CEA: 2.9 ng/mL (ref 0.0–5.0)

## 2014-05-17 LAB — LACTATE DEHYDROGENASE (CC13): LDH: 145 U/L (ref 125–245)

## 2014-05-17 LAB — CYCLOSPORINE LEVEL: Cyclosporine, Blood: 966 ng/mL — ABNORMAL HIGH (ref 140–330)

## 2014-05-17 NOTE — Progress Notes (Signed)
[facility] OFFICE PROGRESS NOTE  DAUB, Lina Sayre, MD [place] [address]  DIAGNOSIS: ADENOCARCINOMA, SIGMOID COLON - Plan: CBC with Differential, CBC with Differential, Comprehensive metabolic panel (Cmet) - [facility], Lactate dehydrogenase (LDH) - [facility], CEA  Personal history of malignant neoplasm of rectum, rectosigmoid junction, and anus  S/P left colectomy  Autoimmune neutropenia - Plan: CBC with Differential, CBC with Differential, Comprehensive metabolic panel (Cmet) - [facility], Lactate dehydrogenase (LDH) - [facility], CEA, Cyclosporine level  Chief Complaint  Patient presents with  . Follow-up    CURRENT THERAPY: 1. Neupogen 300 mcg subcu, currently 4 times a week. Neupogen was  started in late December 2011. As of 06/14/2013, Neupogen dose will be 300 mcg subcu 3 times weekly. As of 02/14/2014, his neupogen dose will be 300 mcg subcu 2 times weekly.  2. Cyclosporine 125 mg twice daily. Cyclosporine was started in August 2012.  INTERVAL HISTORY: [name] 80 y.o. 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  He was last seen by me on 02/14/2014.  Today, he is accompanied by his daughter [name]. He had a recent hospitalization due to an accidental fall resulting in right shoulder and wrist fractures.  He continues to undergo physical therapy for recovery.  His last colonoscopy was on 09/03/2010 and he denies hematochezia or melena.  He is scheduled for follow up with his dermatologist.   MEDICAL HISTORY: Past Medical History  Diagnosis Date  . Aortic root dilatation   . DM2 (diabetes mellitus, type 2)   . Stroke   . HLD (hyperlipidemia)   . HTN (hypertension)   . CAD (coronary artery disease)     Presumed from previously mildy abnormal myoview;  myoview 4/12: EF 73%, no ischemia  . Leukopenia   . Pneumothorax   . Rectal  cancer   . Cardiomyopathy     a. echo 5/12: EF 45-50%, mild LVH, grade 2 diast dysfxn, RAE  . Colon cancer   . Chronic kidney disease     renal insuff, increased uric acid  . Seizures   . Diabetes mellitus   . BPH (benign prostatic hyperplasia)     increased PSA  . Anemia    PROBLEM LIST:  1. 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 The patient had bone marrow on 04/03/2009 and again on 04/08/2010. FISH studies were negative as was the bone marrow. Neupogen was started in late December 2011. Cyclosporine (Gengraf) was started on August 04, 2011.  2. Well-differentiated adenocarcinoma arising in a tubulovillous adenoma, pT1 N0 with 0/13 lymph nodes positive, submucosal invasion positive for lymphovascular invasion but negative for perineural  invasion. The tumor was located in the proximal rectum first detected on colonoscopy on 09/03/2010. Laparoscopic-assisted low anterior resection with a diverting loop ileostomy was carried out  on 12/23/2010. Closure of the loop ileostomy occurred on 04/06/2011. The patient is without evidence of recurrence. Colonoscopy carried out by Dr. Verl Blalock 12/22/2011 was without significant findings.  3. Renal insufficiency.  4. Hyperuricemia.  5. Anemia possibly related to renal insufficiency.  6. Possible seizures dating back to December 2009.  7. History of C difficile diarrhea.  8. Diabetes mellitus.  9. Hypertension.  10. Dyslipidemia.  11. BPH.  12. History of skin cancers.  13. Status post left total hip replacement.  14. Coronary artery disease.  15. History of recurrent  febrile neutropenia with negative cultures  requiring hospital admissions.  INTERIM HISTORY: has ADENOCARCINOMA, SIGMOID COLON; DIABETES MELLITUS-TYPE II; HYPERLIPIDEMIA-MIXED; HYPERTENSION, UNSPECIFIED; CAD, NATIVE VESSEL; ANEMIA, HX OF; Preoperative evaluation to rule out surgical contraindication; Leukopenia; Cardiomyopathy; Autoimmune neutropenia; BPH (benign  prostatic hyperplasia); Special screening for malignant neoplasms, colon; Leucopenia; Personal history of malignant neoplasm of rectum, rectosigmoid junction, and anus; S/P left colectomy; Unspecified deficiency anemia; and History of rectal cancer on his problem list.    ALLERGIES:  has No Known Allergies.  MEDICATIONS: has a current medication list which includes the following prescription(s): allopurinol, alprazolam, amlodipine, aspirin, b-d ins syr microfine 1cc/28g, cyclosporine modified, donepezil, filgrastim, finasteride, fish oil-omega-3 fatty acids, furosemide, hydrocodone-acetaminophen, insulin regular, lamotrigine, metoprolol succinate, nitroglycerin, one touch ultra test, oxycodone-acetaminophen, pravastatin, and doxazosin.  SURGICAL HISTORY:  Past Surgical History  Procedure Laterality Date  . Hip arthroplasty    . Tonsillectomy    . S/p resection of rectal cancer    . Vasectomy    . Ileostomy  12/2010  . Reversal ileostomy  03/2011    REVIEW OF SYSTEMS:   Constitutional: Denies fevers, chills or abnormal weight loss Eyes: Denies blurriness of vision Ears, nose, mouth, throat, and face: Denies mucositis or sore throat Respiratory: Denies cough, dyspnea or wheezes Cardiovascular: Denies palpitation, chest discomfort or lower extremity swelling Gastrointestinal:  Denies nausea, heartburn or change in bowel habits Skin: Denies abnormal skin rashes Lymphatics: Denies new lymphadenopathy or easy bruising Neurological:Denies numbness, tingling or new weaknesses Behavioral/Psych: Mood is stable, no new changes  All other systems were reviewed with the patient and are negative.  PHYSICAL EXAMINATION: ECOG PERFORMANCE STATUS: 0 - Asymptomatic  Blood pressure 137/58, pulse 68, temperature 97.7 F (36.5 C), temperature source Oral, resp. rate 18, height 6' (1.829 m), weight 206 lb 8 oz (93.668 kg), SpO2 100.00%.  GENERAL:alert, no distress and comfortable; chronically ill  appearing.  SKIN: skin color, texture, turgor are normal, no rashes;    EYES: normal, Conjunctiva are pink and non-injected, sclera clear OROPHARYNX:no exudate, no erythema and lips, buccal mucosa, and tongue normal  NECK: supple, thyroid normal size, non-tender, without nodularity LYMPH:  no palpable lymphadenopathy in the cervical, axillary or supraclavicular LUNGS: clear to auscultation and percussion with normal breathing effort HEART: regular rate & rhythm and no murmurs and trace lower extremity edema bilaterally. ABDOMEN:abdomen soft, non-tender and normal bowel sounds Musculoskeletal:no cyanosis of digits and no clubbing; R arm slightly larger than the left.  TTP over the R shoulder.  NEURO: alert & oriented x 3 with fluent speech, no focal motor/sensory deficits  LABORATORY DATA: Results for orders placed in visit on 05/17/14 (from the past 48 hour(s))  CBC WITH DIFFERENTIAL     Status: Abnormal   Collection Time    05/17/14  9:36 AM      Result Value Ref Range   WBC 8.5  4.0 - 10.3 10e3/uL   NEUT# 5.5  1.5 - 6.5 10e3/uL   HGB 10.9 (*) 13.0 - 17.1 g/dL   HCT 33.3 (*) 38.4 - 49.9 %   Platelets 235  140 - 400 10e3/uL   MCV 90.2  79.3 - 98.0 fL   MCH 29.5  27.2 - 33.4 pg   MCHC 32.7  32.0 - 36.0 g/dL   RBC 3.69 (*) 4.20 - 5.82 10e6/uL   RDW 13.7  11.0 - 14.6 %   lymph# 2.4  0.9 - 3.3 10e3/uL   MONO# 0.4  0.1 - 0.9 10e3/uL   Eosinophils Absolute 0.2  0.0 - 0.5 10e3/uL   Basophils Absolute 0.0  0.0 - 0.1 10e3/uL   NEUT% 64.2  39.0 - 75.0 %   LYMPH% 28.4  14.0 - 49.0 %   MONO% 4.4  0.0 - 14.0 %   EOS% 2.5  0.0 - 7.0 %   BASO% 0.5  0.0 - 2.0 %  COMPREHENSIVE METABOLIC PANEL (VQ00)     Status: Abnormal   Collection Time    05/17/14  9:37 AM      Result Value Ref Range   Sodium 137  136 - 145 mEq/L   Potassium 4.4  3.5 - 5.1 mEq/L   Chloride 102  98 - 109 mEq/L   CO2 23  22 - 29 mEq/L   Glucose 215 (*) 70 - 140 mg/dl   BUN 19.6  7.0 - 26.0 mg/dL   Creatinine 1.8 (*) 0.7  - 1.3 mg/dL   Total Bilirubin 0.61  0.20 - 1.20 mg/dL   Alkaline Phosphatase 148  40 - 150 U/L   AST 12  5 - 34 U/L   ALT 10  0 - 55 U/L   Total Protein 6.9  6.4 - 8.3 g/dL   Albumin 3.8  3.5 - 5.0 g/dL   Calcium 9.0  8.4 - 10.4 mg/dL   Anion Gap 12 (*) 3 - 11 mEq/L  LACTATE DEHYDROGENASE (CC13)     Status: None   Collection Time    05/17/14  9:37 AM      Result Value Ref Range   LDH 145  125 - 245 U/L    Labs:  Lab Results  Component Value Date   WBC 8.5 05/17/2014   HGB 10.9* 05/17/2014   HCT 33.3* 05/17/2014   MCV 90.2 05/17/2014   PLT 235 05/17/2014   NEUTROABS 5.5 05/17/2014      Chemistry      Component Value Date/Time   NA 137 05/17/2014 0937   NA 136 04/08/2014 1404   K 4.4 05/17/2014 0937   K 5.0 04/08/2014 1404   CL 97 04/08/2014 1404   CL 104 02/21/2013 1026   CO2 23 05/17/2014 0937   CO2 24 04/08/2014 1404   BUN 19.6 05/17/2014 0937   BUN 27* 04/08/2014 1404   CREATININE 1.8* 05/17/2014 0937   CREATININE 1.74* 04/08/2014 1404   CREATININE 1.53* 07/20/2012 0836   CREATININE 0.89 07/01/2010 1044   GLU 228* 08/13/2010 1312      Component Value Date/Time   CALCIUM 9.0 05/17/2014 0937   CALCIUM 8.9 04/08/2014 1404   ALKPHOS 148 05/17/2014 0937   ALKPHOS 127* 03/30/2014 1123   AST 12 05/17/2014 0937   AST 13 03/30/2014 1123   ALT 10 05/17/2014 0937   ALT 8 03/30/2014 1123   BILITOT 0.61 05/17/2014 0937   BILITOT 1.5* 03/30/2014 1123     CBC:  Recent Labs Lab 05/17/14 0936  WBC 8.5  NEUTROABS 5.5  HGB 10.9*  HCT 33.3*  MCV 90.2  PLT 235   Results for TRENDEN, HAZELRIGG (MRN [id number]) as of 05/17/2014 13:31  Ref. Range 12/25/2012 10:40 01/24/2013 10:04 06/14/2013 11:45 10/15/2013 13:32 02/12/2014 12:42  Cyclosporine, Blood Latest Range: 140-330 ng/mL 95 (L) 93 (L) 105 (L) 384 (H) 306    Studies:  No results found.   RADIOGRAPHIC STUDIES: No results found.  ASSESSMENT: LASARO PRIMM 80 y.o. male with a history of ADENOCARCINOMA, SIGMOID COLON - Plan: CBC with Differential, CBC with  Differential, Comprehensive metabolic panel (Cmet) - [facility], Lactate dehydrogenase (  LDH) - [facility], CEA  Personal history of malignant neoplasm of rectum, rectosigmoid junction, and anus  S/P left colectomy  Autoimmune neutropenia - Plan: CBC with Differential, CBC with Differential, Comprehensive metabolic panel (Cmet) - [facility], Lactate dehydrogenase (LDH) - [facility], CEA, Cyclosporine level   PLAN: 1. Autoimmune neutropenia. - He continues to do well overall.  His current dose of neupogen is 300 mcg SQ 2 days weeks on Monday, Thursday (recently decreased from 3x per week) in March.    --We will continue cyclosporine at the same dose 125 mg twice a day. We will check a cyclosporine level.   2. S/p Fall with R shoulder injury. --He is continuing to recover with physical therapy.  He was counseled to call should he require additional pain medications prior to physical therapy.   3. Sigmoid Colon adenocarcinoma.  - Colonoscopy carried out by Dr. Verl Blalock 12/22/2011 without significant finding. Last CEA level is 2.7 on 02/12/2014.  4. Anemia.  - Likely related to mild renal insufficiency.  Hemoglobin is 10.9 today.  He is without symptoms of anemia.   5. Follow-up.  --Continue to check CBC in 6 weeks and patient instructed to return in 3 months, with a repeat CBC, chemistries, LDH, CEA and a trough cyclosporine level.   All questions were answered. The patient knows to call the clinic with any problems, questions or concerns. We can certainly see the patient much sooner if necessary.  I spent 15 minutes counseling the patient face to face. The total time spent in the appointment was 25 minutes.    Concha Norway, MD 05/17/2014 1:31 PM

## 2014-05-19 ENCOUNTER — Telehealth: Payer: Self-pay | Admitting: Internal Medicine

## 2014-05-19 ENCOUNTER — Other Ambulatory Visit: Payer: Self-pay | Admitting: Internal Medicine

## 2014-05-19 DIAGNOSIS — C187 Malignant neoplasm of sigmoid colon: Secondary | ICD-10-CM

## 2014-05-19 DIAGNOSIS — Z862 Personal history of diseases of the blood and blood-forming organs and certain disorders involving the immune mechanism: Secondary | ICD-10-CM

## 2014-05-19 NOTE — Telephone Encounter (Signed)
[name] for both pt and dtr becky re appt for 6/8. per 6/7 pof dtr was expecting call re repeat lab appt for tomorrow.

## 2014-05-19 NOTE — Telephone Encounter (Signed)
Informed of elevated cyclosporine levels.  Not sure how reflective this is. Hold all cyclosporine and repeat on 06/08. Will check chemistries and CBC.

## 2014-05-20 ENCOUNTER — Other Ambulatory Visit (HOSPITAL_BASED_OUTPATIENT_CLINIC_OR_DEPARTMENT_OTHER): Payer: Medicare Other

## 2014-05-20 ENCOUNTER — Ambulatory Visit (INDEPENDENT_AMBULATORY_CARE_PROVIDER_SITE_OTHER): Payer: Medicare Other | Admitting: Emergency Medicine

## 2014-05-20 ENCOUNTER — Telehealth: Payer: Self-pay | Admitting: Internal Medicine

## 2014-05-20 VITALS — BP 130/62 | HR 59 | Temp 98.0°F | Resp 16 | Ht 72.0 in | Wt 208.0 lb

## 2014-05-20 DIAGNOSIS — Z862 Personal history of diseases of the blood and blood-forming organs and certain disorders involving the immune mechanism: Secondary | ICD-10-CM

## 2014-05-20 DIAGNOSIS — R109 Unspecified abdominal pain: Secondary | ICD-10-CM

## 2014-05-20 DIAGNOSIS — C187 Malignant neoplasm of sigmoid colon: Secondary | ICD-10-CM

## 2014-05-20 DIAGNOSIS — K4090 Unilateral inguinal hernia, without obstruction or gangrene, not specified as recurrent: Secondary | ICD-10-CM

## 2014-05-20 LAB — COMPREHENSIVE METABOLIC PANEL (CC13)
ALT: 9 U/L (ref 0–55)
AST: 9 U/L (ref 5–34)
Albumin: 3.9 g/dL (ref 3.5–5.0)
Alkaline Phosphatase: 138 U/L (ref 40–150)
Anion Gap: 9 mEq/L (ref 3–11)
BUN: 27.4 mg/dL — AB (ref 7.0–26.0)
CALCIUM: 8.9 mg/dL (ref 8.4–10.4)
CHLORIDE: 100 meq/L (ref 98–109)
CO2: 26 mEq/L (ref 22–29)
Creatinine: 2 mg/dL — ABNORMAL HIGH (ref 0.7–1.3)
GLUCOSE: 207 mg/dL — AB (ref 70–140)
Potassium: 4.8 mEq/L (ref 3.5–5.1)
Sodium: 135 mEq/L — ABNORMAL LOW (ref 136–145)
Total Bilirubin: 0.4 mg/dL (ref 0.20–1.20)
Total Protein: 7.1 g/dL (ref 6.4–8.3)

## 2014-05-20 LAB — CBC WITH DIFFERENTIAL/PLATELET
BASO%: 0.5 % (ref 0.0–2.0)
BASOS ABS: 0.1 10*3/uL (ref 0.0–0.1)
EOS ABS: 0.1 10*3/uL (ref 0.0–0.5)
EOS%: 1.1 % (ref 0.0–7.0)
HCT: 32.9 % — ABNORMAL LOW (ref 38.4–49.9)
HEMOGLOBIN: 10.4 g/dL — AB (ref 13.0–17.1)
LYMPH%: 20.9 % (ref 14.0–49.0)
MCH: 28.8 pg (ref 27.2–33.4)
MCHC: 31.6 g/dL — ABNORMAL LOW (ref 32.0–36.0)
MCV: 91.1 fL (ref 79.3–98.0)
MONO#: 0.4 10*3/uL (ref 0.1–0.9)
MONO%: 3.3 % (ref 0.0–14.0)
NEUT%: 74.2 % (ref 39.0–75.0)
NEUTROS ABS: 9 10*3/uL — AB (ref 1.5–6.5)
Platelets: 266 10*3/uL (ref 140–400)
RBC: 3.61 10*6/uL — ABNORMAL LOW (ref 4.20–5.82)
RDW: 14.4 % (ref 11.0–14.6)
WBC: 12.1 10*3/uL — AB (ref 4.0–10.3)
lymph#: 2.5 10*3/uL (ref 0.9–3.3)

## 2014-05-20 NOTE — Progress Notes (Signed)
   Subjective:    Patient ID: [name], male    DOB: 08/06/1932, 80 y.o.   MRN: [id number]  HPI 80 year old male pt presents for a hernia. He has known about it for a couple years. Within the past week it has started to cause him pain. The pain was more severe last night. The pain comes and goes. It has increased in size.    Review of Systems     Objective:   Physical Exam there are multiple scars over the abdomen. There is a large right inguinal hernia which is easily reducible there is no tenderness over the hernia site left testicle is normal there is no left inguinal hernia        Assessment & Plan:  There is a large easily reducible right inguinal hernia. I showed his daughter how it can be reduced.

## 2014-05-20 NOTE — Telephone Encounter (Signed)
lvm for opt regarding to June, July and Aug appt....mailed pt appt sched/avs and letter

## 2014-05-21 ENCOUNTER — Other Ambulatory Visit: Payer: Self-pay | Admitting: Internal Medicine

## 2014-05-21 LAB — CYCLOSPORINE LEVEL: Cyclosporine, Blood: 64 ng/mL — ABNORMAL LOW (ref 140–330)

## 2014-05-21 NOTE — Telephone Encounter (Signed)
Reviewed cyclosporine level of 67 (which is low). He has been off for the past few days.  We instructed him to resume at 3 tablets of cyclosporine bid (down from 4 tabs bid).  We will repeat cyclosporine labs on 06/24.  He voiced understanding.

## 2014-05-25 ENCOUNTER — Telehealth: Payer: Self-pay | Admitting: Internal Medicine

## 2014-06-02 ENCOUNTER — Other Ambulatory Visit: Payer: Self-pay | Admitting: Emergency Medicine

## 2014-06-05 ENCOUNTER — Other Ambulatory Visit (HOSPITAL_BASED_OUTPATIENT_CLINIC_OR_DEPARTMENT_OTHER): Payer: Medicare Other

## 2014-06-05 DIAGNOSIS — C187 Malignant neoplasm of sigmoid colon: Secondary | ICD-10-CM

## 2014-06-05 DIAGNOSIS — D708 Other neutropenia: Secondary | ICD-10-CM

## 2014-06-05 DIAGNOSIS — Z862 Personal history of diseases of the blood and blood-forming organs and certain disorders involving the immune mechanism: Secondary | ICD-10-CM

## 2014-06-05 DIAGNOSIS — D649 Anemia, unspecified: Secondary | ICD-10-CM

## 2014-06-05 LAB — CYCLOSPORINE LEVEL: CYCLOSPORINE, BLOOD: 275 ng/mL (ref 140–330)

## 2014-06-06 ENCOUNTER — Other Ambulatory Visit: Payer: Self-pay

## 2014-06-06 MED ORDER — GLUCOSE BLOOD VI STRP: ORAL_STRIP | Status: DC

## 2014-06-06 MED ORDER — DOXAZOSIN MESYLATE 4 MG PO TABS: 4.0000 mg | ORAL_TABLET | Freq: Every day | ORAL | Status: DC

## 2014-06-10 ENCOUNTER — Other Ambulatory Visit: Payer: Medicare Other

## 2014-06-18 ENCOUNTER — Other Ambulatory Visit: Payer: Self-pay | Admitting: *Deleted

## 2014-06-18 MED ORDER — AMLODIPINE BESYLATE 5 MG PO TABS
5.0000 mg | ORAL_TABLET | Freq: Every day | ORAL | Status: DC
Start: 2014-06-18 — End: 2014-08-21

## 2014-06-20 ENCOUNTER — Telehealth: Payer: Self-pay

## 2014-06-20 NOTE — Telephone Encounter (Signed)
Izora Gala from [facility] wanted to know if she could get a VO for PT from Dr Everlene Farrier concerning Mr Ells due to another fall earlier this week. He thinks may have broke his toe but didn't want to come in. She states that since the fall this week that his gate is worse and he is now using his cane again. She can be reached @ [phone] Thank you

## 2014-06-21 NOTE — Telephone Encounter (Signed)
Please have her buddy tape his broken toe to his healthy toe. Please continue physical therapy.

## 2014-06-21 NOTE — Telephone Encounter (Signed)
[place] for the extension of home care for pt? He does not want to come in at all. "they don't really do anything about toes" Please advise.

## 2014-06-24 NOTE — Telephone Encounter (Signed)
Extended PT for 2 weeks. LM advising to buddy tape toes together and continue PT>

## 2014-06-25 ENCOUNTER — Other Ambulatory Visit: Payer: Self-pay | Admitting: Physician Assistant

## 2014-06-27 ENCOUNTER — Ambulatory Visit: Payer: Medicare Other | Admitting: Emergency Medicine

## 2014-06-28 ENCOUNTER — Other Ambulatory Visit (HOSPITAL_BASED_OUTPATIENT_CLINIC_OR_DEPARTMENT_OTHER): Payer: Medicare Other

## 2014-06-28 DIAGNOSIS — D649 Anemia, unspecified: Secondary | ICD-10-CM

## 2014-06-28 DIAGNOSIS — D708 Other neutropenia: Secondary | ICD-10-CM

## 2014-06-28 DIAGNOSIS — C187 Malignant neoplasm of sigmoid colon: Secondary | ICD-10-CM

## 2014-06-28 LAB — CBC WITH DIFFERENTIAL/PLATELET
BASO%: 0.8 % (ref 0.0–2.0)
Basophils Absolute: 0.2 10*3/uL — ABNORMAL HIGH (ref 0.0–0.1)
EOS%: 1 % (ref 0.0–7.0)
Eosinophils Absolute: 0.2 10*3/uL (ref 0.0–0.5)
HCT: 33.8 % — ABNORMAL LOW (ref 38.4–49.9)
HGB: 10.7 g/dL — ABNORMAL LOW (ref 13.0–17.1)
LYMPH%: 12.3 % — AB (ref 14.0–49.0)
MCH: 28.4 pg (ref 27.2–33.4)
MCHC: 31.6 g/dL — ABNORMAL LOW (ref 32.0–36.0)
MCV: 89.7 fL (ref 79.3–98.0)
MONO#: 0.7 10*3/uL (ref 0.1–0.9)
MONO%: 3.8 % (ref 0.0–14.0)
NEUT%: 82.1 % — ABNORMAL HIGH (ref 39.0–75.0)
NEUTROS ABS: 15.4 10*3/uL — AB (ref 1.5–6.5)
PLATELETS: 259 10*3/uL (ref 140–400)
RBC: 3.76 10*6/uL — AB (ref 4.20–5.82)
RDW: 14.7 % — AB (ref 11.0–14.6)
WBC: 18.8 10*3/uL — ABNORMAL HIGH (ref 4.0–10.3)
lymph#: 2.3 10*3/uL (ref 0.9–3.3)

## 2014-07-02 ENCOUNTER — Encounter: Payer: Self-pay | Admitting: Emergency Medicine

## 2014-07-02 ENCOUNTER — Ambulatory Visit (INDEPENDENT_AMBULATORY_CARE_PROVIDER_SITE_OTHER): Payer: Medicare Other | Admitting: Emergency Medicine

## 2014-07-02 VITALS — BP 128/62 | HR 64 | Temp 98.1°F | Resp 16 | Ht 74.5 in | Wt 209.8 lb

## 2014-07-02 DIAGNOSIS — E1059 Type 1 diabetes mellitus with other circulatory complications: Secondary | ICD-10-CM

## 2014-07-02 DIAGNOSIS — N289 Disorder of kidney and ureter, unspecified: Secondary | ICD-10-CM

## 2014-07-02 DIAGNOSIS — D759 Disease of blood and blood-forming organs, unspecified: Secondary | ICD-10-CM

## 2014-07-02 DIAGNOSIS — Z23 Encounter for immunization: Secondary | ICD-10-CM

## 2014-07-02 LAB — BASIC METABOLIC PANEL WITH GFR
BUN: 24 mg/dL — ABNORMAL HIGH (ref 6–23)
CO2: 24 meq/L (ref 19–32)
Calcium: 8.7 mg/dL (ref 8.4–10.5)
Chloride: 102 mEq/L (ref 96–112)
Creat: 1.78 mg/dL — ABNORMAL HIGH (ref 0.50–1.35)
GFR, EST NON AFRICAN AMERICAN: 35 mL/min — AB
GFR, Est African American: 41 mL/min — ABNORMAL LOW
Glucose, Bld: 156 mg/dL — ABNORMAL HIGH (ref 70–99)
POTASSIUM: 5.4 meq/L — AB (ref 3.5–5.3)
Sodium: 136 mEq/L (ref 135–145)

## 2014-07-02 LAB — POCT GLYCOSYLATED HEMOGLOBIN (HGB A1C): HEMOGLOBIN A1C: 7.8

## 2014-07-02 LAB — GLUCOSE, POCT (MANUAL RESULT ENTRY): POC GLUCOSE: 197 mg/dL — AB (ref 70–99)

## 2014-07-02 NOTE — Progress Notes (Signed)
   Subjective:    Patient ID: [name], male    DOB: December 20, 1931, 80 y.o.   MRN: [id number]  HPI patient in for followup of diabetes. He has had one seizure since he was last here and is currently followed by Dr. Melton Alar. He continues under the care of Dr. Lutricia Feil for his aplastic problems. He continues with all his cardiac meds. He has had some swelling of his legs. He will not wear his support hose. He did strike his right foot recently and suffered fractures to his toes but these are doing well now without pain.    Review of Systems     Objective:   Physical Exam patient is alert and cooperative not ill-appearing. His neck is supple. Chest clear heart regular rate no murmurs abdomen soft nontender. Abdomen without masses extremities showed severe varicose veins bilaterally. There is bruising of the third and first toe of the right foot. Results for orders placed in visit on 07/02/14  GLUCOSE, POCT (MANUAL RESULT ENTRY)      Result Value Ref Range   POC Glucose 197 (*) 70 - 99 mg/dl  POCT GLYCOSYLATED HEMOGLOBIN (HGB A1C)      Result Value Ref Range   Hemoglobin A1C 7.8           Assessment & Plan:  Patient looks good today. Prevnar vaccine given he was cautioned to be more compliant with his insulin administration. His hemoglobin A1c has gone from 6.4 to 7.8

## 2014-07-10 ENCOUNTER — Other Ambulatory Visit: Payer: Self-pay | Admitting: Physician Assistant

## 2014-08-05 ENCOUNTER — Other Ambulatory Visit: Payer: Self-pay | Admitting: Emergency Medicine

## 2014-08-05 NOTE — Telephone Encounter (Signed)
Dr Everlene Farrier, you just saw pt for check up end of July, but don't see lipid test done recently. Do you want to give RFs or pt RTC?

## 2014-08-08 ENCOUNTER — Telehealth: Payer: Self-pay

## 2014-08-08 DIAGNOSIS — E1121 Type 2 diabetes mellitus with diabetic nephropathy: Secondary | ICD-10-CM

## 2014-08-08 NOTE — Telephone Encounter (Signed)
Entered referral based on OV.  Notified Pt.

## 2014-08-08 NOTE — Telephone Encounter (Signed)
Patients daughter was speaking to patient and home health nurse that patient needs a referral to a podiatrist for diabetes.. Please advise if patient needs to return to clinic or if referral can be placed. Thank you!  Best: [phone]

## 2014-08-09 ENCOUNTER — Encounter: Payer: Self-pay | Admitting: Internal Medicine

## 2014-08-09 ENCOUNTER — Other Ambulatory Visit (HOSPITAL_BASED_OUTPATIENT_CLINIC_OR_DEPARTMENT_OTHER): Payer: Medicare Other

## 2014-08-09 ENCOUNTER — Ambulatory Visit (HOSPITAL_BASED_OUTPATIENT_CLINIC_OR_DEPARTMENT_OTHER): Payer: Medicare Other | Admitting: Internal Medicine

## 2014-08-09 VITALS — BP 144/57 | HR 56 | Temp 98.0°F | Resp 19 | Ht 74.5 in | Wt 207.0 lb

## 2014-08-09 DIAGNOSIS — C187 Malignant neoplasm of sigmoid colon: Secondary | ICD-10-CM

## 2014-08-09 DIAGNOSIS — Z862 Personal history of diseases of the blood and blood-forming organs and certain disorders involving the immune mechanism: Secondary | ICD-10-CM

## 2014-08-09 DIAGNOSIS — D708 Other neutropenia: Secondary | ICD-10-CM

## 2014-08-09 LAB — COMPREHENSIVE METABOLIC PANEL (CC13)
ALT: 9 U/L (ref 0–55)
AST: 11 U/L (ref 5–34)
Albumin: 4 g/dL (ref 3.5–5.0)
Alkaline Phosphatase: 134 U/L (ref 40–150)
Anion Gap: 7 mEq/L (ref 3–11)
BUN: 24.3 mg/dL (ref 7.0–26.0)
CO2: 28 mEq/L (ref 22–29)
CREATININE: 1.8 mg/dL — AB (ref 0.7–1.3)
Calcium: 9.2 mg/dL (ref 8.4–10.4)
Chloride: 103 mEq/L (ref 98–109)
GLUCOSE: 135 mg/dL (ref 70–140)
Potassium: 5 mEq/L (ref 3.5–5.1)
Sodium: 138 mEq/L (ref 136–145)
Total Bilirubin: 0.68 mg/dL (ref 0.20–1.20)
Total Protein: 7.2 g/dL (ref 6.4–8.3)

## 2014-08-09 LAB — CBC WITH DIFFERENTIAL/PLATELET
BASO%: 0.5 % (ref 0.0–2.0)
Basophils Absolute: 0.1 10*3/uL (ref 0.0–0.1)
EOS ABS: 0.2 10*3/uL (ref 0.0–0.5)
EOS%: 2.2 % (ref 0.0–7.0)
HEMATOCRIT: 33.6 % — AB (ref 38.4–49.9)
HGB: 10.7 g/dL — ABNORMAL LOW (ref 13.0–17.1)
LYMPH%: 24.6 % (ref 14.0–49.0)
MCH: 28.8 pg (ref 27.2–33.4)
MCHC: 31.8 g/dL — AB (ref 32.0–36.0)
MCV: 90.3 fL (ref 79.3–98.0)
MONO#: 0.4 10*3/uL (ref 0.1–0.9)
MONO%: 3.9 % (ref 0.0–14.0)
NEUT%: 68.8 % (ref 39.0–75.0)
NEUTROS ABS: 7.2 10*3/uL — AB (ref 1.5–6.5)
PLATELETS: 223 10*3/uL (ref 140–400)
RBC: 3.72 10*6/uL — ABNORMAL LOW (ref 4.20–5.82)
RDW: 14.9 % — ABNORMAL HIGH (ref 11.0–14.6)
WBC: 10.4 10*3/uL — ABNORMAL HIGH (ref 4.0–10.3)
lymph#: 2.6 10*3/uL (ref 0.9–3.3)

## 2014-08-09 LAB — LACTATE DEHYDROGENASE (CC13): LDH: 154 U/L (ref 125–245)

## 2014-08-09 NOTE — Progress Notes (Signed)
[facility] OFFICE PROGRESS NOTE  DAUB, Lina Sayre, MD [address] [place] [address]  DIAGNOSIS: ANEMIA, HX OF - Plan: CBC with Differential, CBC with Differential, Cyclosporine level  ADENOCARCINOMA, SIGMOID COLON - Plan: CBC with Differential, CBC with Differential, Lactate dehydrogenase (LDH) - [facility], Basic metabolic panel (Bmet) - [facility], Cyclosporine level  Chief Complaint  Patient presents with  . ADENOCARCINOMA, SIGMOID COLON    CURRENT THERAPY: 1. Neupogen 300 mcg subcu, currently 4 times a week. Neupogen was started in late December 2011. As of 06/14/2013, Neupogen dose will be 300 mcg subcu 3 times weekly. As of 02/14/2014, his neupogen dose will be 300 mcg subcu 2 times weekly. 2. Cyclosporine 125 mg twice daily. Cyclosporine was started in August 2012.  INTERVAL HISTORY: [name] 80 y.o. 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  He was last seen by me on 05/17/2014.  Today, he is accompanied by his daughter [name]. In May 2015, he had a recent hospitalization due to an accidental fall resulting in right shoulder and wrist fractures.  He continues to undergo physical therapy for recovery.  His last colonoscopy was on 09/03/2010 and he denies hematochezia or melena.     MEDICAL HISTORY: Past Medical History  Diagnosis Date  . Aortic root dilatation   . DM2 (diabetes mellitus, type 2)   . Stroke   . HLD (hyperlipidemia)   . HTN (hypertension)   . CAD (coronary artery disease)     Presumed from previously mildy abnormal myoview;  myoview 4/12: EF 73%, no ischemia  . Leukopenia   . Pneumothorax   . Rectal cancer   . Cardiomyopathy     a. echo 5/12: EF 45-50%, mild LVH, grade 2 diast dysfxn, RAE  . Colon cancer   . Chronic kidney disease     renal insuff, increased uric acid  . Seizures   . Diabetes mellitus   . BPH  (benign prostatic hyperplasia)     increased PSA  . Anemia    PROBLEM LIST:  1. 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 The patient had bone marrow on 04/03/2009 and again on 04/08/2010. FISH studies were negative as was the bone marrow. Neupogen was started in late December 2011. Cyclosporine (Gengraf) was started on August 04, 2011.  2. Well-differentiated adenocarcinoma arising in a tubulovillous adenoma, pT1 N0 with 0/13 lymph nodes positive, submucosal invasion positive for lymphovascular invasion but negative for perineural  invasion. The tumor was located in the proximal rectum first detected on colonoscopy on 09/03/2010. Laparoscopic-assisted low anterior resection with a diverting loop ileostomy was carried out on 12/23/2010. Closure of the loop ileostomy occurred on 04/06/2011. The patient is without evidence of recurrence. Colonoscopy carried out by Dr. Verl Blalock 12/22/2011 was without significant findings.  3. Renal insufficiency.  4. Hyperuricemia.  5. Anemia possibly related to renal insufficiency.  6. Possible seizures dating back to December 2009.  7. History of C difficile diarrhea.  8. Diabetes mellitus.  9. Hypertension.  10. Dyslipidemia.  11. BPH.  12. History of skin cancers.  13. Status post left total hip replacement.  14. Coronary artery disease.  15. History of recurrent febrile neutropenia with negative cultures  requiring hospital admissions.  INTERIM HISTORY: has ADENOCARCINOMA, SIGMOID COLON; DIABETES MELLITUS-TYPE II; HYPERLIPIDEMIA-MIXED; HYPERTENSION, UNSPECIFIED; CAD, NATIVE VESSEL; ANEMIA, HX OF; Preoperative evaluation to rule out  surgical contraindication; Leukopenia; Cardiomyopathy; Autoimmune neutropenia; BPH (benign prostatic hyperplasia); Special screening for malignant neoplasms, colon; Leucopenia; Personal history of malignant neoplasm of rectum, rectosigmoid junction, and anus; S/P left colectomy; Unspecified deficiency anemia; and  History of rectal cancer on his problem list.    ALLERGIES:  has No Known Allergies.  MEDICATIONS: has a current medication list which includes the following prescription(s): allopurinol, alprazolam, amlodipine, aspirin, b-d ins syr microfine 1cc/28g, cyclosporine modified, donepezil, doxazosin, doxazosin, filgrastim, finasteride, fish oil-omega-3 fatty acids, furosemide, glucose blood, hydrocodone-acetaminophen, insulin regular, lamotrigine, metoprolol succinate, nitroglycerin, one touch ultra test, oxycodone-acetaminophen, pravastatin, and pravastatin.  SURGICAL HISTORY:  Past Surgical History  Procedure Laterality Date  . Hip arthroplasty    . Tonsillectomy    . S/p resection of rectal cancer    . Vasectomy    . Ileostomy  12/2010  . Reversal ileostomy  03/2011    REVIEW OF SYSTEMS:   Constitutional: Denies fevers, chills or abnormal weight loss Eyes: Denies blurriness of vision Ears, nose, mouth, throat, and face: Denies mucositis or sore throat Respiratory: Denies cough, dyspnea or wheezes Cardiovascular: Denies palpitation, chest discomfort or lower extremity swelling Gastrointestinal:  Denies nausea, heartburn or change in bowel habits Skin: Denies abnormal skin rashes Lymphatics: Denies new lymphadenopathy or easy bruising Neurological:Denies numbness, tingling or new weaknesses Behavioral/Psych: Mood is stable, no new changes  All other systems were reviewed with the patient and are negative.  PHYSICAL EXAMINATION: ECOG PERFORMANCE STATUS: 0 - Asymptomatic  Blood pressure 144/57, pulse 56, temperature 98 F (36.7 C), temperature source Oral, resp. rate 19, height 6' 2.5" (1.892 m), weight 207 lb (93.895 kg), SpO2 98.00%.  GENERAL:alert, no distress and comfortable; chronically ill appearing.  SKIN: skin color, texture, turgor are normal, no rashes;  Chronic scaling of skin.  EYES: normal, Conjunctiva are pink and non-injected, sclera clear OROPHARYNX:no exudate, no  erythema and lips, buccal mucosa, and tongue normal  NECK: supple, thyroid normal size, non-tender, without nodularity LYMPH:  no palpable lymphadenopathy in the cervical, axillary or supraclavicular LUNGS: clear to auscultation and percussion with normal breathing effort HEART: regular rate & rhythm and no murmurs and trace lower extremity edema bilaterally. ABDOMEN:abdomen soft, non-tender and normal bowel sounds Musculoskeletal:no cyanosis of digits and no clubbing; R arm with decreased ROM.  NEURO: alert & oriented x 3 with fluent speech, no focal motor/sensory deficits  LABORATORY DATA: Results for orders placed in visit on 08/09/14 (from the past 48 hour(s))  CBC WITH DIFFERENTIAL     Status: Abnormal   Collection Time    08/09/14 10:23 AM      Result Value Ref Range   WBC 10.4 (*) 4.0 - 10.3 10e3/uL   NEUT# 7.2 (*) 1.5 - 6.5 10e3/uL   HGB 10.7 (*) 13.0 - 17.1 g/dL   HCT 33.6 (*) 38.4 - 49.9 %   Platelets 223  140 - 400 10e3/uL   MCV 90.3  79.3 - 98.0 fL   MCH 28.8  27.2 - 33.4 pg   MCHC 31.8 (*) 32.0 - 36.0 g/dL   RBC 3.72 (*) 4.20 - 5.82 10e6/uL   RDW 14.9 (*) 11.0 - 14.6 %   lymph# 2.6  0.9 - 3.3 10e3/uL   MONO# 0.4  0.1 - 0.9 10e3/uL   Eosinophils Absolute 0.2  0.0 - 0.5 10e3/uL   Basophils Absolute 0.1  0.0 - 0.1 10e3/uL   NEUT% 68.8  39.0 - 75.0 %   LYMPH% 24.6  14.0 - 49.0 %   MONO% 3.9  0.0 - 14.0 %   EOS% 2.2  0.0 - 7.0 %   BASO% 0.5  0.0 - 2.0 %  LACTATE DEHYDROGENASE (CC13)     Status: None   Collection Time    08/09/14 10:23 AM      Result Value Ref Range   LDH 154  125 - 245 U/L  COMPREHENSIVE METABOLIC PANEL (KV42)     Status: Abnormal   Collection Time    08/09/14 10:23 AM      Result Value Ref Range   Sodium 138  136 - 145 mEq/L   Potassium 5.0  3.5 - 5.1 mEq/L   Chloride 103  98 - 109 mEq/L   CO2 28  22 - 29 mEq/L   Glucose 135  70 - 140 mg/dl   BUN 24.3  7.0 - 26.0 mg/dL   Creatinine 1.8 (*) 0.7 - 1.3 mg/dL   Total Bilirubin 0.68  0.20 - 1.20  mg/dL   Alkaline Phosphatase 134  40 - 150 U/L   AST 11  5 - 34 U/L   ALT 9  0 - 55 U/L   Total Protein 7.2  6.4 - 8.3 g/dL   Albumin 4.0  3.5 - 5.0 g/dL   Calcium 9.2  8.4 - 10.4 mg/dL   Anion Gap 7  3 - 11 mEq/L    Labs:  Lab Results  Component Value Date   WBC 10.4* 08/09/2014   HGB 10.7* 08/09/2014   HCT 33.6* 08/09/2014   MCV 90.3 08/09/2014   PLT 223 08/09/2014   NEUTROABS 7.2* 08/09/2014      Chemistry      Component Value Date/Time   NA 138 08/09/2014 1023   NA 136 07/02/2014 1213   K 5.0 08/09/2014 1023   K 5.4* 07/02/2014 1213   CL 102 07/02/2014 1213   CL 104 02/21/2013 1026   CO2 28 08/09/2014 1023   CO2 24 07/02/2014 1213   BUN 24.3 08/09/2014 1023   BUN 24* 07/02/2014 1213   CREATININE 1.8* 08/09/2014 1023   CREATININE 1.78* 07/02/2014 1213   CREATININE 1.53* 07/20/2012 0836   CREATININE 0.89 07/01/2010 1044   GLU 228* 08/13/2010 1312      Component Value Date/Time   CALCIUM 9.2 08/09/2014 1023   CALCIUM 8.7 07/02/2014 1213   ALKPHOS 134 08/09/2014 1023   ALKPHOS 127* 03/30/2014 1123   AST 11 08/09/2014 1023   AST 13 03/30/2014 1123   ALT 9 08/09/2014 1023   ALT 8 03/30/2014 1123   BILITOT 0.68 08/09/2014 1023   BILITOT 1.5* 03/30/2014 1123     CBC:  Recent Labs Lab 08/09/14 1023  WBC 10.4*  NEUTROABS 7.2*  HGB 10.7*  HCT 33.6*  MCV 90.3  PLT 223   Studies:  No results found.  RADIOGRAPHIC STUDIES: No results found.  ASSESSMENT: Festus Aloe 80 y.o. male with a history of ANEMIA, HX OF - Plan: CBC with Differential, CBC with Differential, Cyclosporine level  ADENOCARCINOMA, SIGMOID COLON - Plan: CBC with Differential, CBC with Differential, Lactate dehydrogenase (LDH) - [facility], Basic metabolic panel (Bmet) - [facility], Cyclosporine level   PLAN: 1. Autoimmune neutropenia. - He continues to do well overall.  His current dose of neupogen is 300 mcg SQ 2 days weeks on Monday, Thursday. It was decreased from three times a week in March, 2015.    --We will continue  cyclosporine at the same dose 125 mg twice a day. We will check a cyclosporine level.  Today's  level is pending.   2. S/p Fall with R shoulder injury. --He completed physical therapy.    3. Sigmoid Colon adenocarcinoma.  - Colonoscopy carried out by Dr. Verl Blalock 12/22/2011 without significant finding. Last CEA level is 2.7 on 02/12/2014. We will follow level every 6 months.   4. Anemia.  - Likely related to mild renal insufficiency.  Hemoglobin is 10.7 today.  He is without symptoms of anemia.   5. Follow-up.  --Continue to check CBC in 6 weeks and patient instructed to return in 3 months, with a repeat CBC, chemistries, LDH, CEA and a trough cyclosporine level.   All questions were answered. The patient knows to call the clinic with any problems, questions or concerns. We can certainly see the patient much sooner if necessary.  I spent 15 minutes counseling the patient face to face. The total time spent in the appointment was 25 minutes.    Annalisa Colonna, MD 08/09/2014 11:38 AM

## 2014-08-12 ENCOUNTER — Telehealth: Payer: Self-pay

## 2014-08-12 LAB — CYCLOSPORINE LEVEL: Cyclosporine, Blood: 77 ng/mL — ABNORMAL LOW (ref 140–330)

## 2014-08-12 LAB — CEA: CEA: 2 ng/mL (ref 0.0–5.0)

## 2014-08-12 NOTE — Telephone Encounter (Signed)
Pt daughter states that the home health nurse is wanting pt to see someone about his hernia

## 2014-08-13 ENCOUNTER — Other Ambulatory Visit: Payer: Self-pay | Admitting: *Deleted

## 2014-08-13 ENCOUNTER — Telehealth: Payer: Self-pay | Admitting: Internal Medicine

## 2014-08-13 DIAGNOSIS — C187 Malignant neoplasm of sigmoid colon: Secondary | ICD-10-CM

## 2014-08-13 DIAGNOSIS — Z862 Personal history of diseases of the blood and blood-forming organs and certain disorders involving the immune mechanism: Secondary | ICD-10-CM

## 2014-08-13 DIAGNOSIS — D539 Nutritional anemia, unspecified: Secondary | ICD-10-CM

## 2014-08-13 DIAGNOSIS — D72819 Decreased white blood cell count, unspecified: Secondary | ICD-10-CM

## 2014-08-13 DIAGNOSIS — D708 Other neutropenia: Secondary | ICD-10-CM

## 2014-08-13 MED ORDER — FILGRASTIM 300 MCG/ML IJ SOLN: 300.0000 ug | Freq: Every day | INTRAMUSCULAR | Status: DC

## 2014-08-13 NOTE — Telephone Encounter (Signed)
Spoke to pt- advised him to RTC for evaluation of hernia tomorrow at 8am to see Dr. Everlene Farrier

## 2014-08-13 NOTE — Telephone Encounter (Signed)
s.w.l pt wife and advised on OCT and Dec appt....pt ok and aware

## 2014-08-14 ENCOUNTER — Ambulatory Visit (INDEPENDENT_AMBULATORY_CARE_PROVIDER_SITE_OTHER): Payer: Medicare Other | Admitting: Emergency Medicine

## 2014-08-14 VITALS — BP 130/60 | HR 69 | Temp 98.0°F | Resp 16 | Ht 74.5 in | Wt 211.0 lb

## 2014-08-14 DIAGNOSIS — I2589 Other forms of chronic ischemic heart disease: Secondary | ICD-10-CM

## 2014-08-14 DIAGNOSIS — E1122 Type 2 diabetes mellitus with diabetic chronic kidney disease: Secondary | ICD-10-CM

## 2014-08-14 DIAGNOSIS — R269 Unspecified abnormalities of gait and mobility: Secondary | ICD-10-CM

## 2014-08-14 DIAGNOSIS — N189 Chronic kidney disease, unspecified: Secondary | ICD-10-CM

## 2014-08-14 DIAGNOSIS — I25810 Atherosclerosis of coronary artery bypass graft(s) without angina pectoris: Secondary | ICD-10-CM

## 2014-08-14 DIAGNOSIS — I255 Ischemic cardiomyopathy: Secondary | ICD-10-CM

## 2014-08-14 DIAGNOSIS — K4091 Unilateral inguinal hernia, without obstruction or gangrene, recurrent: Secondary | ICD-10-CM

## 2014-08-14 DIAGNOSIS — Z23 Encounter for immunization: Secondary | ICD-10-CM

## 2014-08-14 DIAGNOSIS — E1129 Type 2 diabetes mellitus with other diabetic kidney complication: Secondary | ICD-10-CM

## 2014-08-14 LAB — GLUCOSE, POCT (MANUAL RESULT ENTRY): POC Glucose: 161 mg/dl — AB (ref 70–99)

## 2014-08-14 MED ORDER — MIRTAZAPINE 15 MG PO TABS: 15.0000 mg | ORAL_TABLET | Freq: Every day | ORAL | Status: DC

## 2014-08-14 NOTE — Progress Notes (Addendum)
   Subjective:    Patient ID: [name], male    DOB: 1932-07-07, 80 y.o.   MRN: [id number]  HPI Pt here for a consult about his hernia.   Pt also been having trouble with falls. He has had PT in the past. The patient has a prescription from Dr. Juliann Mule for allopurinol 100 mg 2 tablets twice a day. The nurse had questions regarding the addition of an ACE or an ARB but patient has renal disease so this has not been done in the past. There was also a question regarding fall risk prevention measures. He has been to physical therapy in the past.      Review of Systems     Objective:   Physical Exam  Constitutional: He is oriented to person, place, and time.  The patient is elderly and debilitated and needs assistance on the exam table  HENT:  Head: Normocephalic and atraumatic.  Eyes: Pupils are equal, round, and reactive to light.  Neck: No thyromegaly present.  Cardiovascular: Normal rate and regular rhythm.   Pulmonary/Chest: Effort normal and breath sounds normal.  Abdominal:  There is a large easily reducible right inguinal hernia.  Neurological: He is alert and oriented to person, place, and time.  Skin: Skin is warm and dry.  Psychiatric: He has a normal mood and affect. His behavior is normal.     Results for orders placed in visit on 08/14/14  GLUCOSE, POCT (MANUAL RESULT ENTRY)      Result Value Ref Range   POC Glucose 161 (*) 70 - 99 mg/dl   Meds ordered this encounter  Medications  . mirtazapine (REMERON) 15 MG tablet    Sig: Take 1 tablet (15 mg total) by mouth at bedtime.    Dispense:  30 tablet    Refill:  5        Assessment & Plan:  Will decrease his allopurinol to one tablet twice a day. We'll try Remeron 15 mg at bedtime for sleep instead of Xanax and see if he does better with this. Referral made for physical therapy. Patient has very depressed since the loss of his wife. He also has multiple medical problems and has had a decline in health over the  last year. Flu shot was given. I did show the daughter how to reduce his hernia.

## 2014-08-20 ENCOUNTER — Other Ambulatory Visit: Payer: Self-pay | Admitting: Physician Assistant

## 2014-08-21 ENCOUNTER — Other Ambulatory Visit: Payer: Self-pay

## 2014-08-21 MED ORDER — AMLODIPINE BESYLATE 5 MG PO TABS: 5.0000 mg | ORAL_TABLET | Freq: Every day | ORAL | Status: DC

## 2014-08-27 ENCOUNTER — Other Ambulatory Visit (INDEPENDENT_AMBULATORY_CARE_PROVIDER_SITE_OTHER): Payer: Self-pay | Admitting: Surgery

## 2014-08-27 NOTE — H&P (Signed)
ance C. Raglin 08/27/2014 12:12 PM Location: [facility] Patient #: [id number] DOB: 1931/12/20 Married / Language: English / Race: White Male History of Present Illness [name] Moores A. Melchor Kirchgessner MD; 08/27/2014 12:43 PM) Patient words: hernia  pt has a right inguinal hernia causing pain and discomfort.  The patient is a 80 year old male who presents with an inguinal hernia. The hernia(s) is/are located on the right side. Symptoms include inguinal bulge, scrotal mass and inguinal pain. The pain is located in the right inguinal area. There is no radiation. The patient describes the pain as dull. The patient describes this as moderate in severity and worsening. Symptoms are exacerbated by lifting. Symptoms are relieved by recumbency. Other Problems Alexander Bergeron [place], [place]; 08/27/2014 12:12 PM) Colon Cancer Diabetes Mellitus Enlarged Prostate Inguinal Hernia Melanoma Seizure Disorder  Past Surgical History ([place], [place]; 08/27/2014 12:12 PM) Colon Removal - Partial Hip Surgery Left.  Diagnostic Studies History ([place], [place]; 08/27/2014 12:12 PM) Colonoscopy 5-10 years ago  Allergies ([place], [place]; 08/27/2014 12:15 PM) No Known Drug Allergies 08/27/2014  Social History ([place], [place]; 08/27/2014 12:12 PM) Alcohol use Moderate alcohol use. Caffeine use Carbonated beverages, Coffee. No drug use Tobacco use Never smoker.  Family History Alexander Bergeron [place], [place]; 08/27/2014 12:12 PM) Breast Cancer Daughter.     Review of Systems ([place] RMA; 08/27/2014 12:12 PM) General Not Present- Appetite Loss, Chills, Fatigue, Fever, Night Sweats, Weight Gain and Weight Loss. Skin Not Present- Change in Wart/Mole, Dryness, Hives, Jaundice, New Lesions, Non-Healing Wounds, Rash and Ulcer. HEENT Not Present- Earache, Hearing Loss, Hoarseness, Nose Bleed, Oral Ulcers, Ringing in the Ears, Seasonal Allergies,  Sinus Pain, Sore Throat, Visual Disturbances, Wears glasses/contact lenses and Yellow Eyes. Respiratory Not Present- Bloody sputum, Chronic Cough, Difficulty Breathing, Snoring and Wheezing. Breast Not Present- Breast Mass, Breast Pain, Nipple Discharge and Skin Changes. Cardiovascular Not Present- Chest Pain, Difficulty Breathing Lying Down, Leg Cramps, Palpitations, Rapid Heart Rate, Shortness of Breath and Swelling of Extremities. Gastrointestinal Present- Abdominal Pain. Not Present- Bloating, Bloody Stool, Change in Bowel Habits, Chronic diarrhea, Constipation, Difficulty Swallowing, Excessive gas, Gets full quickly at meals, Hemorrhoids, Indigestion, Nausea, Rectal Pain and Vomiting. Musculoskeletal Not Present- Back Pain, Joint Pain, Joint Stiffness, Muscle Pain, Muscle Weakness and Swelling of Extremities. Neurological Present- Decreased Memory, Seizures, Tremor, Trouble walking and Weakness. Not Present- Fainting, Headaches, Numbness and Tingling. Psychiatric Not Present- Anxiety, Bipolar, Change in Sleep Pattern, Depression, Fearful and Frequent crying. Endocrine Present- Cold Intolerance. Not Present- Excessive Hunger, Hair Changes, Heat Intolerance, Hot flashes and New Diabetes. Hematology Present- Easy Bruising. Not Present- Excessive bleeding, Gland problems, HIV and Persistent Infections.  Vitals ([place]ah Maldonado RMA; 08/27/2014 12:15 PM) 08/27/2014 12:14 PM Weight: 215.8 lb Height: 74.5in Body Surface Area: 2.27 m Body Mass Index: 27.34 kg/m Temp.: 17F(Oral)  Pulse: 68 (Regular)  P.OX: 97% (Room air) BP: 142/68 (Sitting, Left Arm, Standard)     Physical Exam (Bevin Mayall A. Nature Kueker MD; 08/27/2014 12:45 PM)  General Mental Status-Alert. General Appearance-Consistent with stated age. Hydration-Well hydrated. Voice-Normal.  Head and Neck Head-normocephalic, atraumatic with no lesions or palpable masses. Trachea-midline. Thyroid Gland  Characteristics - normal size and consistency.  Eye Eyeball - Bilateral-Extraocular movements intact. Sclera/Conjunctiva - Bilateral-No scleral icterus.  Chest and Lung Exam Chest and lung exam reveals -quiet, even and easy respiratory effort with no use of accessory muscles, normal resonance, no flatness or dullness, non-tender and normal tactile fremitus and on auscultation, normal breath sounds, no adventitious sounds and normal vocal resonance.  Inspection Chest Wall - Normal. Back - normal.  Breast Breast - Left-Symmetric, Non Tender, No Biopsy scars, no Dimpling, No Inflammation, No Lumpectomy scars, No Mastectomy scars, No Peau d' Orange. Breast - Right-Symmetric, Non Tender, No Biopsy scars, no Dimpling, No Inflammation, No Lumpectomy scars, No Mastectomy scars, No Peau d' Orange. Breast Lump-No Palpable Breast Mass.  Cardiovascular Cardiovascular examination reveals -on palpation PMI is normal in location and amplitude, no palpable S3 or S4. Normal cardiac borders., normal heart sounds, regular rate and rhythm with no murmurs, carotid auscultation reveals no bruits and normal pedal pulses bilaterally.  Abdomen Inspection Inspection of the abdomen reveals - No Hernias. Skin - Scar - no surgical scars. Palpation/Percussion Palpation and Percussion of the abdomen reveal - Soft, Non Tender, No Rebound tenderness, No Rigidity (guarding) and No hepatosplenomegaly. Auscultation Auscultation of the abdomen reveals - Bowel sounds normal.  Male Genitourinary Note: right inguinal hernia large reducible   Rectal Anorectal Exam Internal - normal internal exam and normal sphincter tone. Proctoscopic exam-No Internal hemorrhoids.  Peripheral Vascular Upper Extremity Inspection - Bilateral - Normal - No Clubbing, No Cyanosis, No Edema, Pulses Intact. Palpation - Pulses bilaterally normal. Lower Extremity Palpation - Pulses bilaterally normal.  Neurologic Neurologic  evaluation reveals -alert and oriented x 3 with no impairment of recent or remote memory. Mental Status-Normal.  Musculoskeletal Normal Exam - Left-Upper Extremity Strength Normal and Lower Extremity Strength Normal. Normal Exam - Right-Upper Extremity Strength Normal, Lower Extremity Weakness.  Lymphatic Head & Neck  General Head & Neck Lymphatics: Bilateral - Description - Normal. Axillary  General Axillary Region: Bilateral - Description - Normal. Tenderness - Non Tender. Femoral & Inguinal  Generalized Femoral & Inguinal Lymphatics: Bilateral - Description - Normal. Tenderness - Non Tender.    Assessment & Plan ([place]a Affinito A. Meliss Fleek MD; 08/27/2014 12:48 PM)  INGUINAL HERNIA (550.90  K40.90) Impression: Recommend Surgery for right inguinal hernia with mesh The risk of hernia repair include bleeding, infection, organ injury, bowel injury, bladder injury, nerve injury recurrent hernia, blood clots, worsening of underlying condition, chronic pain, mesh use, open surgery, death, and the need for other operattions. Pt agrees to proceed  Current Plans Recommend surgery I discussed hernia surgery with the patient to include the risks and benefits to include, but not limited to: infection, bleeding, damage to surrounding structures, chronic wound healing, nerve pain, and possible recurrence. The patient voiced understanding and wishes to proceed at this time. REPAIR, INGUINAL HERNIA, REDUCIBLE, INITIAL, IN PATIENT AGE 22 YEARS OR OLDER ([id number])

## 2014-08-30 ENCOUNTER — Other Ambulatory Visit: Payer: Self-pay | Admitting: *Deleted

## 2014-08-30 MED ORDER — AMLODIPINE BESYLATE 5 MG PO TABS: 5.0000 mg | ORAL_TABLET | Freq: Every day | ORAL | Status: DC

## 2014-09-02 ENCOUNTER — Other Ambulatory Visit: Payer: Self-pay | Admitting: Internal Medicine

## 2014-09-02 ENCOUNTER — Encounter: Payer: Self-pay | Admitting: Podiatry

## 2014-09-02 ENCOUNTER — Other Ambulatory Visit: Payer: Self-pay | Admitting: Emergency Medicine

## 2014-09-02 ENCOUNTER — Ambulatory Visit (INDEPENDENT_AMBULATORY_CARE_PROVIDER_SITE_OTHER): Payer: Medicare Other | Admitting: Podiatry

## 2014-09-02 VITALS — BP 167/82 | HR 65 | Resp 15 | Ht 74.5 in | Wt 210.0 lb

## 2014-09-02 DIAGNOSIS — E1149 Type 2 diabetes mellitus with other diabetic neurological complication: Secondary | ICD-10-CM

## 2014-09-02 DIAGNOSIS — B351 Tinea unguium: Secondary | ICD-10-CM

## 2014-09-02 DIAGNOSIS — E119 Type 2 diabetes mellitus without complications: Secondary | ICD-10-CM

## 2014-09-02 NOTE — Progress Notes (Signed)
   Subjective:    Patient ID: [name], male    DOB: 04-Oct-1932, 80 y.o.   MRN: [id number]  HPI Comments: N debridement L 10 toenails D and O long-term C enlongated, thickened toenails A diabetic, difficult to cut and decreased circulation per pt T hx of home pedicure     Review of Systems  All other systems reviewed and are negative.      Objective:   Physical Exam  Orientated x3 white male presents with his daughter  Vascular: DP and PT pulses 2/4 bilaterally Mild pitting edema dorsal feet bilaterally  Neurological: Ankle reflex equal and reactive bilaterally Vibratory sensation nonreactive bilaterally Sensation to 10 g monofilament wire intact 0/5 bilaterally  Dermatological: Atrophic skin without any hair growth noted bilaterally The toenails are elongated, discolored, hypertrophic 6-10  Musculoskeletal: Hammertoe second left No restriction of ankle, midtarsal, subtalar joints bilaterally      Assessment & Plan:   Assessment: Satisfactory vascular status Diabetic peripheral neuropathy Onychomycoses x10  Plan: Nails x10 are debrided without a bleeding  Reappoint x3 months

## 2014-09-02 NOTE — Patient Instructions (Signed)
Diabetes and Foot Care Diabetes may cause you to have problems because of poor blood supply (circulation) to your feet and legs. This may cause the skin on your feet to become thinner, break easier, and heal more slowly. Your skin may become dry, and the skin may peel and crack. You may also have nerve damage in your legs and feet causing decreased feeling in them. You may not notice minor injuries to your feet that could lead to infections or more serious problems. Taking care of your feet is one of the most important things you can do for yourself.  HOME CARE INSTRUCTIONS  Wear shoes at all times, even in the house. Do not go barefoot. Bare feet are easily injured.  Check your feet daily for blisters, cuts, and redness. If you cannot see the bottom of your feet, use a mirror or ask someone for help.  Wash your feet with warm water (do not use hot water) and mild soap. Then pat your feet and the areas between your toes until they are completely dry. Do not soak your feet as this can dry your skin.  Apply a moisturizing lotion or petroleum jelly (that does not contain alcohol and is unscented) to the skin on your feet and to dry, brittle toenails. Do not apply lotion between your toes.  Trim your toenails straight across. Do not dig under them or around the cuticle. File the edges of your nails with an emery board or nail file.  Do not cut corns or calluses or try to remove them with medicine.  Wear clean socks or stockings every day. Make sure they are not too tight. Do not wear knee-high stockings since they may decrease blood flow to your legs.  Wear shoes that fit properly and have enough cushioning. To break in new shoes, wear them for just a few hours a day. This prevents you from injuring your feet. Always look in your shoes before you put them on to be sure there are no objects inside.  Do not cross your legs. This may decrease the blood flow to your feet.  If you find a minor scrape,  cut, or break in the skin on your feet, keep it and the skin around it clean and dry. These areas may be cleansed with mild soap and water. Do not cleanse the area with peroxide, alcohol, or iodine.  When you remove an adhesive bandage, be sure not to damage the skin around it.  If you have a wound, look at it several times a day to make sure it is healing.  Do not use heating pads or hot water bottles. They may burn your skin. If you have lost feeling in your feet or legs, you may not know it is happening until it is too late.  Make sure your health care provider performs a complete foot exam at least annually or more often if you have foot problems. Report any cuts, sores, or bruises to your health care provider immediately. SEEK MEDICAL CARE IF:   You have an injury that is not healing.  You have cuts or breaks in the skin.  You have an ingrown nail.  You notice redness on your legs or feet.  You feel burning or tingling in your legs or feet.  You have pain or cramps in your legs and feet.  Your legs or feet are numb.  Your feet always feel cold. SEEK IMMEDIATE MEDICAL CARE IF:   There is increasing redness,   swelling, or pain in or around a wound.  There is a red line that goes up your leg.  Pus is coming from a wound.  You develop a fever or as directed by your health care provider.  You notice a bad smell coming from an ulcer or wound. Document Released: 11/26/2000 Document Revised: 08/01/2013 Document Reviewed: 05/08/2013 Exit[employer] Patient Information 2015 [employer]. This information is not intended to replace advice given to you by your health care provider. Make sure you discuss any questions you have with your health care provider.  

## 2014-09-03 ENCOUNTER — Encounter: Payer: Self-pay | Admitting: Podiatry

## 2014-09-17 ENCOUNTER — Other Ambulatory Visit: Payer: Self-pay | Admitting: *Deleted

## 2014-09-17 DIAGNOSIS — C187 Malignant neoplasm of sigmoid colon: Secondary | ICD-10-CM

## 2014-09-17 MED ORDER — ALLOPURINOL 100 MG PO TABS: ORAL_TABLET | ORAL | Status: DC

## 2014-09-19 ENCOUNTER — Other Ambulatory Visit: Payer: Self-pay | Admitting: *Deleted

## 2014-09-19 DIAGNOSIS — D72819 Decreased white blood cell count, unspecified: Secondary | ICD-10-CM

## 2014-09-19 DIAGNOSIS — Z862 Personal history of diseases of the blood and blood-forming organs and certain disorders involving the immune mechanism: Secondary | ICD-10-CM

## 2014-09-20 ENCOUNTER — Other Ambulatory Visit (HOSPITAL_BASED_OUTPATIENT_CLINIC_OR_DEPARTMENT_OTHER): Payer: Medicare Other

## 2014-09-20 DIAGNOSIS — C187 Malignant neoplasm of sigmoid colon: Secondary | ICD-10-CM

## 2014-09-20 DIAGNOSIS — Z862 Personal history of diseases of the blood and blood-forming organs and certain disorders involving the immune mechanism: Secondary | ICD-10-CM

## 2014-09-20 DIAGNOSIS — D72819 Decreased white blood cell count, unspecified: Secondary | ICD-10-CM

## 2014-09-20 LAB — CBC WITH DIFFERENTIAL/PLATELET
BASO%: 0.3 % (ref 0.0–2.0)
BASOS ABS: 0 10*3/uL (ref 0.0–0.1)
EOS%: 2.3 % (ref 0.0–7.0)
Eosinophils Absolute: 0.2 10*3/uL (ref 0.0–0.5)
HEMATOCRIT: 33.4 % — AB (ref 38.4–49.9)
HEMOGLOBIN: 10.8 g/dL — AB (ref 13.0–17.1)
LYMPH%: 20 % (ref 14.0–49.0)
MCH: 29.1 pg (ref 27.2–33.4)
MCHC: 32.3 g/dL (ref 32.0–36.0)
MCV: 90 fL (ref 79.3–98.0)
MONO#: 0.4 10*3/uL (ref 0.1–0.9)
MONO%: 3.9 % (ref 0.0–14.0)
NEUT#: 7 10*3/uL — ABNORMAL HIGH (ref 1.5–6.5)
NEUT%: 73.5 % (ref 39.0–75.0)
PLATELETS: 217 10*3/uL (ref 140–400)
RBC: 3.71 10*6/uL — ABNORMAL LOW (ref 4.20–5.82)
RDW: 15 % — ABNORMAL HIGH (ref 11.0–14.6)
WBC: 9.5 10*3/uL (ref 4.0–10.3)
lymph#: 1.9 10*3/uL (ref 0.9–3.3)

## 2014-09-20 LAB — COMPREHENSIVE METABOLIC PANEL (CC13)
ALT: 8 U/L (ref 0–55)
AST: 13 U/L (ref 5–34)
Albumin: 3.8 g/dL (ref 3.5–5.0)
Alkaline Phosphatase: 126 U/L (ref 40–150)
Anion Gap: 8 mEq/L (ref 3–11)
BILIRUBIN TOTAL: 0.58 mg/dL (ref 0.20–1.20)
BUN: 27.1 mg/dL — ABNORMAL HIGH (ref 7.0–26.0)
CO2: 28 mEq/L (ref 22–29)
CREATININE: 2.1 mg/dL — AB (ref 0.7–1.3)
Calcium: 9.1 mg/dL (ref 8.4–10.4)
Chloride: 104 mEq/L (ref 98–109)
Glucose: 207 mg/dl — ABNORMAL HIGH (ref 70–140)
Potassium: 4.5 mEq/L (ref 3.5–5.1)
Sodium: 139 mEq/L (ref 136–145)
Total Protein: 7 g/dL (ref 6.4–8.3)

## 2014-09-23 ENCOUNTER — Telehealth: Payer: Self-pay

## 2014-09-23 ENCOUNTER — Other Ambulatory Visit: Payer: Self-pay | Admitting: Emergency Medicine

## 2014-09-23 NOTE — Telephone Encounter (Signed)
[name] would like a call back from Dr. Everlene Farrier when he get a chance, states Dr. Josetta Huddle office is during a hernia surgery on her Dad and she is so unsure about it. Dr. Everlene Farrier had advised against having it done Wanted to know if he would call Dr. Josetta Huddle office at [phone] and you may call [name] at [phone] or [phone], The surgery is scheduled for 10/08/14

## 2014-09-23 NOTE — Telephone Encounter (Signed)
Dr. Everlene Farrier please advise- procedure shown to daughter during ov on minimizing hernia- no mention of sx or avoiding sx.

## 2014-09-24 ENCOUNTER — Telehealth: Payer: Self-pay | Admitting: Emergency Medicine

## 2014-09-24 ENCOUNTER — Other Ambulatory Visit: Payer: Self-pay | Admitting: Radiology

## 2014-09-24 DIAGNOSIS — D72829 Elevated white blood cell count, unspecified: Secondary | ICD-10-CM

## 2014-09-24 DIAGNOSIS — K4091 Unilateral inguinal hernia, without obstruction or gangrene, recurrent: Secondary | ICD-10-CM

## 2014-09-24 DIAGNOSIS — I25810 Atherosclerosis of coronary artery bypass graft(s) without angina pectoris: Secondary | ICD-10-CM

## 2014-09-24 NOTE — Telephone Encounter (Signed)
Patient does want to proceed with the surgery. I did speak with him and told him he would need clearance from his cardiologist and from his oncologist prior to having that surgery. These referrals will be made. I will let Dr. Josetta Huddle office know we are making these referrals

## 2014-09-24 NOTE — Telephone Encounter (Signed)
Left message with [facility] surgical scheduler for Dr Brantley Stage to advise. I have asked her to return my call

## 2014-10-01 ENCOUNTER — Telehealth: Payer: Self-pay

## 2014-10-01 NOTE — Telephone Encounter (Signed)
Dr. Everlene Farrier,  Pt's daughter is confused as to why pt's cardiology and oncology appt's are scheduled after pt is due to have surgery. Pt's cardiology appt is 11/2 and pt still has not been able to set up a oncology appt. Do we need to change the surgery date so that his medical clearance appt's will be finished before he is to have surgery. Daughter is unsure of pt having surgery now.

## 2014-10-01 NOTE — Telephone Encounter (Signed)
[name], Patients daughter says she has her father set up for surgery for a hernia on the 27th. All the specialist office referrals: oncology and cardiology have scheduled appointments for medical clearance for next month. Dr. Everlene Farrier, becky wants to know what can she do she feels this is all backwards that they need medical clearance before surgery. She also doesn't feel the risk for surgery is not worth it. She would prefer her father not have the surgery. Please advise.   Best: [phone]

## 2014-10-01 NOTE — Telephone Encounter (Signed)
Called again to speak to [name], have left another message for her. I am concerned, looks like surgery already scheduled, before appts are scheduled.

## 2014-10-01 NOTE — Telephone Encounter (Signed)
I called and spoke with the surgical appointment Center. They are going to hold his surgery until after his clearance by cardiology and oncology. He cannot have his surgery until he is clear

## 2014-10-02 NOTE — Telephone Encounter (Signed)
There is another open message concerning this, Dr Everlene Farrier has spoken to Dr Brantley Stage and surgery has been cancelled until clearance obtained.

## 2014-10-02 NOTE — Telephone Encounter (Signed)
Pt daughter has been advised. She wants to make sure Dr. Everlene Farrier gets a copy of these letters.

## 2014-10-04 ENCOUNTER — Other Ambulatory Visit: Payer: Self-pay | Admitting: Emergency Medicine

## 2014-10-08 ENCOUNTER — Telehealth: Payer: Self-pay

## 2014-10-08 ENCOUNTER — Ambulatory Visit (HOSPITAL_BASED_OUTPATIENT_CLINIC_OR_DEPARTMENT_OTHER): Admission: RE | Admit: 2014-10-08 | Payer: Medicare Other | Source: Ambulatory Visit | Admitting: Surgery

## 2014-10-08 ENCOUNTER — Encounter (HOSPITAL_BASED_OUTPATIENT_CLINIC_OR_DEPARTMENT_OTHER): Admission: RE | Payer: Self-pay | Source: Ambulatory Visit

## 2014-10-08 SURGERY — REPAIR, HERNIA, INGUINAL, ADULT
Anesthesia: General | Laterality: Right

## 2014-10-08 NOTE — Telephone Encounter (Signed)
I called and spoke with the daughter and told her he cannot take castor oil. Apparently he had a good response to castor oil. Now he is just feeling weak. I told her to check him and be sure his hernia was reducible. She has been instructed in the past how to reduce his hernia.

## 2014-10-08 NOTE — Telephone Encounter (Signed)
Spoke to pt daughter. Pt was constipated and she gave him a dose of Castor Oil. Following pt was was able to use the restroom and had some diarrhea. Pt feels terrible now and is weak feeling but overall he is ok. Pt advised to increase fluids and stop taking stool softeners and no more Castor Oil. Daughter understands RTC precautions.

## 2014-10-08 NOTE — Telephone Encounter (Signed)
Dr.Daub, Pts daughter [name] would like to speak with you regarding giving her dad castrol.  [phone]

## 2014-10-10 NOTE — Progress Notes (Signed)
HPI: FU presumed CAD based upon an abnormal stress test in the past as well as diabetes, hypertension, hyperlipidemia, prior stroke, aortic root dilatation and rectal carcinoma. Most recent myoview in April of 2012 showed an ejection fraction of 73% and no ischemia. Patient had an echocardiogram during an admission in May of 2012 for sepsis. His ejection fraction was 45-50%. 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 FU echocardiogram was performed in October 2012 and showed normal LV function with mild left atrial enlargement; mildly dilated aortic root. Since I last saw him, Denies dyspnea, chest pain, palpitations or syncope. He has limited mobility.  Current Outpatient Prescriptions  Medication Sig Dispense Refill  . allopurinol (ZYLOPRIM) 100 MG tablet Take two (2) tablets by mouth twice a day. (Patient taking differently: Take 100 mg by mouth 2 (two) times daily. Take one tablet by mouth twice a day.) 120 tablet 3  . amLODipine (NORVASC) 5 MG tablet Take 1 tablet (5 mg total) by mouth daily. 30 tablet 1  . aspirin 81 MG tablet Take 81 mg by mouth daily.      . B-D INS SYR MICROFINE 1CC/28G 28G X 1/2" 1 ML MISC USE AS DIRECTED 1 each 8  . cycloSPORINE modified (NEORAL) 25 MG capsule Take 125 mg by mouth 2 (two) times daily. 3 tabs twice daily =6 tabs total daily    . donepezil (ARICEPT) 5 MG tablet Take 5 mg by mouth at bedtime as needed.    . doxazosin (CARDURA) 4 MG tablet TAKE 1 TABLET BY MOUTH AT BEDTIME 90 tablet 1  . filgrastim (NEUPOGEN) 300 MCG/ML injection Inject 1 mL (300 mcg total) into the skin daily. Take on Tuesdays & Fridays. 30 mL 1  . finasteride (PROSCAR) 5 MG tablet TAKE 1 TABLET BY MOUTH EVERY DAY 30 tablet 3  . fish oil-omega-3 fatty acids 1000 MG capsule Take 2 g by mouth daily.      . furosemide (LASIX) 20 MG tablet TAKE 1 TABLET (20 MG TOTAL) BY MOUTH DAILY. 30 tablet 10  . glucose blood (ONE TOUCH ULTRA TEST) test strip USE WITH METER TO CHECK  BLOOD SUGAR 3 TIMES A DAY AS DIRECTED 300 each 2  . HYDROcodone-acetaminophen (NORCO/VICODIN) 5-325 MG per tablet Take 1-2 tablets by mouth every 4 (four) hours as needed. 20 tablet 0  . insulin regular (HUMULIN R) 100 units/mL injection Check 30-60 minutes prior to each meal. Give sliding-scale insulin as instructed 20 mL 11  . lamoTRIgine (LAMICTAL) 25 MG tablet Take 100 mg by mouth 3 (three) times daily. 2 tabs in the am, 2 tabs at noon, 4 tabs at bedtime    . metoprolol succinate (TOPROL-XL) 25 MG 24 hr tablet Take 25 mg by mouth daily. 1/2 tab daily    . metoprolol succinate (TOPROL-XL) 25 MG 24 hr tablet TAKE 1 TABLET BY MOUTH EVERY DAY 30 tablet 4  . mirtazapine (REMERON) 15 MG tablet Take 1 tablet (15 mg total) by mouth at bedtime. 30 tablet 5  . nitroGLYCERIN (NITROSTAT) 0.4 MG SL tablet Place 1 tablet (0.4 mg total) under the tongue every 5 (five) minutes as needed for chest pain. 25 tablet 3  . ONE TOUCH ULTRA TEST test strip CHECK BLOOD SUGAR 3 TIMES A DAY AS DIRECTED 100 each 8  . oxyCODONE-acetaminophen (PERCOCET/ROXICET) 5-325 MG per tablet Take 1 tablet by mouth every 6 (six) hours as needed for severe pain. 20 tablet 0  . pravastatin (PRAVACHOL) 20 MG  tablet Take 1 tablet (20 mg total) by mouth daily. 90 tablet 4  . pravastatin (PRAVACHOL) 20 MG tablet TAKE 1 TABLET EVERY DAY 30 tablet 4   No current facility-administered medications for this visit.     Past Medical History  Diagnosis Date  . Aortic root dilatation   . DM2 (diabetes mellitus, type 2)   . Stroke   . HLD (hyperlipidemia)   . HTN (hypertension)   . CAD (coronary artery disease)     Presumed from previously mildy abnormal myoview;  myoview 4/12: EF 73%, no ischemia  . Leukopenia   . Pneumothorax   . Rectal cancer   . Cardiomyopathy     a. echo 5/12: EF 45-50%, mild LVH, grade 2 diast dysfxn, RAE  . Colon cancer   . Chronic kidney disease     renal insuff, increased uric acid  . Seizures   . Diabetes  mellitus   . BPH (benign prostatic hyperplasia)     increased PSA  . Anemia     Past Surgical History  Procedure Laterality Date  . Hip arthroplasty    . Tonsillectomy    . S/p resection of rectal cancer    . Vasectomy    . Ileostomy  12/2010  . Reversal ileostomy  03/2011    History   Social History  . Marital Status: Married    Spouse Name: N/A    Number of Children: N/A  . Years of Education: N/A   Occupational History  . retired    Social History Main Topics  . Smoking status: Never Smoker   . Smokeless tobacco: Never Used  . Alcohol Use: 2.4 oz/week    4 Cans of beer per week  . Drug Use: No  . Sexual Activity: Not on file   Other Topics Concern  . Not on file   Social History Narrative    ROS: Gait instability but no fevers or chills, productive cough, hemoptysis, dysphasia, odynophagia, melena, hematochezia, dysuria, hematuria, rash, seizure activity, orthopnea, PND, pedal edema, claudication. Remaining systems are negative.  Physical Exam: Well-developed well-nourished in no acute distress.  Skin is warm and dry.  HEENT is normal.  Neck is supple.  Chest is clear to auscultation with normal expansion.  Cardiovascular exam is regular rate and rhythm.  Abdominal exam nontender or distended. No masses palpated. Extremities show no edema. neuro grossly intact  ECG Sinus rhythm with occasional PVC. Nonspecific ST-T wave changes.

## 2014-10-14 ENCOUNTER — Ambulatory Visit (INDEPENDENT_AMBULATORY_CARE_PROVIDER_SITE_OTHER): Payer: Medicare Other | Admitting: Cardiology

## 2014-10-14 ENCOUNTER — Encounter: Payer: Self-pay | Admitting: Cardiology

## 2014-10-14 VITALS — BP 140/84 | HR 72 | Ht 75.0 in | Wt 216.0 lb

## 2014-10-14 DIAGNOSIS — I10 Essential (primary) hypertension: Secondary | ICD-10-CM

## 2014-10-14 DIAGNOSIS — I2510 Atherosclerotic heart disease of native coronary artery without angina pectoris: Secondary | ICD-10-CM

## 2014-10-14 DIAGNOSIS — Z01810 Encounter for preprocedural cardiovascular examination: Secondary | ICD-10-CM

## 2014-10-14 NOTE — Assessment & Plan Note (Signed)
Scheduled for hernia repair. Very limited mobility. Schedule nuclear study for preoperative risk stratification.

## 2014-10-14 NOTE — Patient Instructions (Signed)
Your physician wants you to follow-up in: ONE YEAR WITH DR CRENSHAW You will receive a reminder letter in the mail two months in advance. If you don't receive a letter, please call our office to schedule the follow-up appointment.   Your physician has requested that you have a lexiscan myoview. For further information please visit [email or web address]. Please follow instruction sheet, as given.   

## 2014-10-14 NOTE — Assessment & Plan Note (Signed)
Continue statin. 

## 2014-10-14 NOTE — Assessment & Plan Note (Signed)
Improved on most recent echocardiogram. 

## 2014-10-14 NOTE — Assessment & Plan Note (Signed)
Continue aspirin and statin. 

## 2014-10-14 NOTE — Assessment & Plan Note (Signed)
Continue present blood pressure medications. 

## 2014-10-15 ENCOUNTER — Ambulatory Visit (INDEPENDENT_AMBULATORY_CARE_PROVIDER_SITE_OTHER): Payer: Medicare Other | Admitting: Emergency Medicine

## 2014-10-15 ENCOUNTER — Encounter: Payer: Self-pay | Admitting: Emergency Medicine

## 2014-10-15 VITALS — BP 141/70 | HR 78 | Temp 97.4°F | Resp 16 | Ht 75.0 in | Wt 213.0 lb

## 2014-10-15 DIAGNOSIS — N189 Chronic kidney disease, unspecified: Secondary | ICD-10-CM

## 2014-10-15 DIAGNOSIS — E1122 Type 2 diabetes mellitus with diabetic chronic kidney disease: Secondary | ICD-10-CM

## 2014-10-15 LAB — POCT GLYCOSYLATED HEMOGLOBIN (HGB A1C): HEMOGLOBIN A1C: 6.9

## 2014-10-15 NOTE — Progress Notes (Signed)
Subjective:  This chart was scribed for [name], MD by Donato Schultz, Medical Scribe. This patient was seen in Room 21 and the patient's care was started at 1:37 PM.   Patient ID: [name], male    DOB: January 12, 1932, 80 y.o.   MRN: [id number]  HPI HPI Comments: [name] is a 80 y.o. male with a history of TIA and CAD who presents to the [facility] complaining of a hernia.  The patient's daughter states that he woke up a couple of nights ago with severe abdominal pain and diaphoresis.  She states he tried to vomit but was unable to but experienced relief to his symptoms after he passed gas.  She states that he is unable to have a bowel movement without a laxative.  He had a stress test conducted yesterday to determine if he is eligible for a hernia repair surgery.  Dr. Catalina Gravel is his neurologist.  His next appointment with his oncologist is in the middle of next month.    He is also complaining of constant right arm pain that started after he fell.  He has already received his flu shot and Prevnar.     Patient Active Problem List   Diagnosis Date Noted  . Preop cardiovascular exam 10/14/2014  . History of rectal cancer 03/02/2013  . Unspecified deficiency anemia 08/18/2012  . Special screening for malignant neoplasms, colon 12/22/2011  . Leucopenia 12/22/2011  . Personal history of malignant neoplasm of rectum, rectosigmoid junction, and anus 12/22/2011  . S/P left colectomy 12/22/2011  . BPH (benign prostatic hyperplasia)   . 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 Leukopenia   . Cardiomyopathy   . Preoperative evaluation to rule out surgical contraindication 03/24/2011  . ADENOCARCINOMA, SIGMOID COLON 09/07/2010  . DIABETES MELLITUS-TYPE II 07/09/2010  . ANEMIA, HX OF 07/09/2010  . HYPERLIPIDEMIA-MIXED 09/20/2009  . Essential hypertension 09/20/2009  . CAD, NATIVE VESSEL 09/20/2009   Past Medical History  Diagnosis Date  . Aortic root dilatation     . DM2 (diabetes mellitus, type 2)   . Stroke   . HLD (hyperlipidemia)   . HTN (hypertension)   . CAD (coronary artery disease)     Presumed from previously mildy abnormal myoview;  myoview 4/12: EF 73%, no ischemia  . Leukopenia   . Pneumothorax   . Rectal cancer   . Cardiomyopathy     a. echo 5/12: EF 45-50%, mild LVH, grade 2 diast dysfxn, RAE  . Colon cancer   . Chronic kidney disease     renal insuff, increased uric acid  . Seizures   . Diabetes mellitus   . BPH (benign prostatic hyperplasia)     increased PSA  . Anemia    Past Surgical History  Procedure Laterality Date  . Hip arthroplasty    . Tonsillectomy    . S/p resection of rectal cancer    . Vasectomy    . Ileostomy  12/2010  . Reversal ileostomy  03/2011   No Known Allergies Prior to Admission medications   Medication Sig Start Date End Date Taking? Authorizing Provider  allopurinol (ZYLOPRIM) 100 MG tablet Take two (2) tablets by mouth twice a day. Patient taking differently: Take 100 mg by mouth 2 (two) times daily. Take one tablet by mouth twice a day. 09/17/14   Aasim Marla Roe, MD  amLODipine (NORVASC) 5 MG tablet Take 1 tablet (5 mg total) by mouth daily. 08/30/14   Lelon Perla, MD  aspirin 81 MG tablet Take 81 mg by mouth daily.      Historical Provider, MD  B-D INS SYR MICROFINE 1CC/28G 28G X 1/2" 1 ML MISC USE AS DIRECTED 12/06/13   Collene Leyden, PA-C  cycloSPORINE modified (NEORAL) 25 MG capsule Take 125 mg by mouth 2 (two) times daily. 3 tabs twice daily =6 tabs total daily 06/21/12   Jeanie Cooks, MD  donepezil (ARICEPT) 5 MG tablet Take 5 mg by mouth at bedtime as needed.    Historical Provider, MD  doxazosin (CARDURA) 4 MG tablet TAKE 1 TABLET BY MOUTH AT BEDTIME 09/24/14   Mancel Bale, PA-C  filgrastim (NEUPOGEN) 300 MCG/ML injection Inject 1 mL (300 mcg total) into the skin daily. Take on Tuesdays & Fridays. 08/13/14   Aasim Marla Roe, MD  finasteride (PROSCAR) 5 MG tablet TAKE  1 TABLET BY MOUTH EVERY DAY 09/03/14   Mancel Bale, PA-C  fish oil-omega-3 fatty acids 1000 MG capsule Take 2 g by mouth daily.      Historical Provider, MD  furosemide (LASIX) 20 MG tablet TAKE 1 TABLET (20 MG TOTAL) BY MOUTH DAILY. 08/22/13   Lelon Perla, MD  glucose blood (ONE TOUCH ULTRA TEST) test strip USE WITH METER TO CHECK BLOOD SUGAR 3 TIMES A DAY AS DIRECTED 06/06/14   [name], MD  HYDROcodone-acetaminophen (NORCO/VICODIN) 5-325 MG per tablet Take 1-2 tablets by mouth every 4 (four) hours as needed. 03/23/14   Shari A Upstill, PA-C  insulin regular (HUMULIN R) 100 units/mL injection Check 30-60 minutes prior to each meal. Give sliding-scale insulin as instructed 10/23/13   [name], MD  lamoTRIgine (LAMICTAL) 25 MG tablet Take 100 mg by mouth 3 (three) times daily. 2 tabs in the am, 2 tabs at noon, 4 tabs at bedtime    Historical Provider, MD  metoprolol succinate (TOPROL-XL) 25 MG 24 hr tablet Take 25 mg by mouth daily. 1/2 tab daily    Historical Provider, MD  metoprolol succinate (TOPROL-XL) 25 MG 24 hr tablet TAKE 1 TABLET BY MOUTH EVERY DAY 10/04/14   [name], MD  mirtazapine (REMERON) 15 MG tablet Take 1 tablet (15 mg total) by mouth at bedtime. 08/14/14   [name], MD  nitroGLYCERIN (NITROSTAT) 0.4 MG SL tablet Place 1 tablet (0.4 mg total) under the tongue every 5 (five) minutes as needed for chest pain. 09/17/13   Lelon Perla, MD  ONE TOUCH ULTRA TEST test strip CHECK BLOOD SUGAR 3 TIMES A DAY AS DIRECTED 03/12/13   [name], MD  oxyCODONE-acetaminophen (PERCOCET/ROXICET) 5-325 MG per tablet Take 1 tablet by mouth every 6 (six) hours as needed for severe pain. 03/30/14   [name], MD  pravastatin (PRAVACHOL) 20 MG tablet Take 1 tablet (20 mg total) by mouth daily. 05/31/12   Lelon Perla, MD  pravastatin (PRAVACHOL) 20 MG tablet TAKE 1 TABLET EVERY DAY 08/06/14   [name], MD   History   Social History  . Marital Status: Married     Spouse Name: N/A    Number of Children: N/A  . Years of Education: N/A   Occupational History  . retired    Social History Main Topics  . Smoking status: Never Smoker   . Smokeless tobacco: Never Used  . Alcohol Use: 2.4 oz/week    4 Cans of beer per week  . Drug Use: No  . Sexual Activity: Not on file  Other Topics Concern  . Not on file   Social History Narrative     Review of Systems  Gastrointestinal: Positive for abdominal pain and constipation.  Musculoskeletal: Positive for arthralgias.    Objective:  Physical Exam  Constitutional: He is oriented to person, place, and time. He appears well-developed and well-nourished.  Frail, elderly gentleman who is not in any distress.   HENT:  Head: Normocephalic and atraumatic.  Eyes: EOM are normal.  Neck: Normal range of motion.  Cardiovascular: Normal rate, regular rhythm and normal heart sounds.   No murmur heard. Pulmonary/Chest: Effort normal and breath sounds normal. No respiratory distress. He has no wheezes. He has no rales.  Abdominal: Soft. Bowel sounds are normal. There is no tenderness. A hernia is present. Hernia confirmed positive in the right inguinal area.  Lemon-sized right inguinal hernia which was easily reducible.   Musculoskeletal: Normal range of motion.  Neurological: He is alert and oriented to person, place, and time.  Skin: Skin is warm and dry.  Psychiatric: He has a normal mood and affect. His behavior is normal.  Nursing note and vitals reviewed.  Results for orders placed or performed in visit on 10/15/14  POCT glycosylated hemoglobin (Hb A1C)  Result Value Ref Range   Hemoglobin A1C 6.9     BP 141/70 mmHg  Pulse 78  Temp(Src) 97.4 F (36.3 C) (Oral)  Resp 16  Ht 6\' 3"  (1.905 m)  Wt 213 lb (96.616 kg)  BMI 26.62 kg/m2  SpO2 97% Assessment & Plan:  It is difficult to understand how much trouble he is having with his hernia.  He has been bothered with constipation currently  treated with Miralax and stool softeners.  When he is questioned, he says he does not have much problems but his caregiver states that he does.  Will speak with the surgeon about his anesthesia.  He should have clearance from the oncologist and cardiologist as far as surgery.  He is scheduled for an upcoming stress test.   I personally performed the services described in this documentation, which was scribed in my presence. The recorded information has been reviewed and is accurate.

## 2014-10-15 NOTE — Progress Notes (Deleted)
   Subjective:    Patient ID: [name], male    DOB: [birth year], 80 y.o.   MRN: [id number]  HPI    Review of Systems     Objective:   Physical Exam        Assessment & Plan:

## 2014-10-24 ENCOUNTER — Telehealth (HOSPITAL_COMMUNITY): Payer: Self-pay

## 2014-10-24 NOTE — Telephone Encounter (Signed)
Encounter complete. 

## 2014-10-25 ENCOUNTER — Telehealth (HOSPITAL_COMMUNITY): Payer: Self-pay

## 2014-10-25 NOTE — Telephone Encounter (Signed)
Encounter complete. 

## 2014-10-29 ENCOUNTER — Ambulatory Visit (HOSPITAL_COMMUNITY)
Admission: RE | Admit: 2014-10-29 | Discharge: 2014-10-29 | Disposition: A | Discharge status: Home / Self Care | Payer: Medicare Other | Source: Ambulatory Visit | Attending: Cardiovascular Disease | Admitting: Cardiovascular Disease

## 2014-10-29 ENCOUNTER — Other Ambulatory Visit: Payer: Self-pay | Admitting: Physician Assistant

## 2014-10-29 DIAGNOSIS — E785 Hyperlipidemia, unspecified: Secondary | ICD-10-CM | POA: Diagnosis not present

## 2014-10-29 DIAGNOSIS — I10 Essential (primary) hypertension: Secondary | ICD-10-CM | POA: Diagnosis not present

## 2014-10-29 DIAGNOSIS — I2510 Atherosclerotic heart disease of native coronary artery without angina pectoris: Secondary | ICD-10-CM

## 2014-10-29 DIAGNOSIS — Z794 Long term (current) use of insulin: Secondary | ICD-10-CM | POA: Insufficient documentation

## 2014-10-29 DIAGNOSIS — I739 Peripheral vascular disease, unspecified: Secondary | ICD-10-CM | POA: Insufficient documentation

## 2014-10-29 DIAGNOSIS — E119 Type 2 diabetes mellitus without complications: Secondary | ICD-10-CM | POA: Diagnosis not present

## 2014-10-29 MED ORDER — TECHNETIUM TC 99M SESTAMIBI GENERIC - CARDIOLITE
30.1000 | Freq: Once | INTRAVENOUS | Status: AC | PRN
  Administered 2014-10-29: 30.1 via INTRAVENOUS

## 2014-10-29 MED ORDER — REGADENOSON 0.4 MG/5ML IV SOLN
0.4000 mg | Freq: Once | INTRAVENOUS | Status: AC
  Administered 2014-10-29: 0.4 mg via INTRAVENOUS

## 2014-10-29 MED ORDER — TECHNETIUM TC 99M SESTAMIBI GENERIC - CARDIOLITE
10.4000 | Freq: Once | INTRAVENOUS | Status: AC | PRN
  Administered 2014-10-29: 10 via INTRAVENOUS

## 2014-10-29 NOTE — Procedures (Addendum)
[facility] [address] [place] [address] [phone]  Cardiology Nuclear Med Study  [name] is a 80 y.o. male     MRN : [id number]     DOB: 21-Oct-1932  Procedure Date: 10/29/2014  Nuclear Med Background Indication for Stress Test:  Surgical Clearance History:  CAD;Cardiomyopathy;Last NUC MPI on 03/31/2011-nonischemic;EF=73%;ECHO in 04/2011;EF=45-50% Cardiac Risk Factors: Hypertension, IDDM Type 2, Lipids and PVD  Symptoms:  Aortic Root Dialation   Nuclear Pre-Procedure Caffeine/Decaff Intake:  1:00am NPO After: 11am   IV Site: R Forearm  IV 0.9% NS with Angio Cath:  22g  Chest Size (in):  42"  IV Started by: Rolene Course, RN  Height: 6\' 3"  (1.905 m)  Cup Size: n/a  BMI:  Body mass index is 26.62 kg/(m^2). Weight:  213 lb (96.616 kg)   Tech Comments:  n/a    Nuclear Med Study 1 or 2 day study: 1 day  Stress Test Type:  [place]  Order Authorizing Provider:  Kirk Ruths, MD   Resting Radionuclide: Technetium 85m Sestamibi  Resting Radionuclide Dose: 10.4 mCi   Stress Radionuclide:  Technetium 71m Sestamibi  Stress Radionuclide Dose: 30.1 mCi           Stress Protocol Rest HR: 71 Stress HR: 81  Rest BP: 178/77 Stress BP: 165/65  Exercise Time (min): n/a METS: n/a   Predicted Max HR: 140 bpm % Max HR: 59.29 bpm Rate Pressure Product: 14774  Dose of Adenosine (mg):  n/a Dose of Lexiscan: 0.4 mg  Dose of Atropine (mg): n/a Dose of Dobutamine: n/a mcg/kg/min (at max HR)  Stress Test Technologist: Leane Para, CCT Nuclear Technologist: Imagene Riches, CNMT   Rest Procedure:  Myocardial perfusion imaging was performed at rest 45 minutes following the intravenous administration of Technetium 26m Sestamibi. Stress Procedure:  The patient received IV Lexiscan 0.4 mg over 15-seconds.  Technetium 29m Sestamibi injected at 30-seconds.  There were no significant changes with Lexiscan.  Quantitative  spect images were obtained after a 45 minute delay.  Transient Ischemic Dilatation (Normal <1.22):  1.31  QGS EDV:  98 ml QGS ESV:  38 ml LV Ejection Fraction: 62%       Rest ECG: NSR - Normal EKG  Stress ECG: No significant change from baseline ECG  QPS Raw Data Images:  There is interference from nuclear activity from structures below the diaphragm. This does not affect the ability to read the study. Stress Images:  Normal homogeneous uptake in all areas of the myocardium. Rest Images:  Normal homogeneous uptake in all areas of the myocardium. Subtraction (SDS):  No evidence of ischemia.  Impression Exercise Capacity:  Lexiscan with no exercise. BP Response:  Normal blood pressure response. Clinical Symptoms:  No significant symptoms noted. ECG Impression:  No significant ST segment change suggestive of ischemia. Comparison with Prior Nuclear Study: No significant change from previous study  Overall Impression:  Normal stress nuclear study. and Excessive gut uptake  LV Wall Motion:  NL LV Function; NL Wall Motion   [name], MD  10/29/2014 5:28 PM

## 2014-10-30 ENCOUNTER — Telehealth: Payer: Self-pay | Admitting: Cardiology

## 2014-10-30 ENCOUNTER — Telehealth: Payer: Self-pay

## 2014-10-30 MED ORDER — AMLODIPINE BESYLATE 5 MG PO TABS: 5.0000 mg | ORAL_TABLET | Freq: Every day | ORAL | Status: DC

## 2014-10-30 NOTE — Telephone Encounter (Signed)
Pt still have not gotten his Amlodipine. Would you please call today to [phone].

## 2014-11-01 ENCOUNTER — Other Ambulatory Visit: Payer: Self-pay | Admitting: Cardiology

## 2014-11-01 ENCOUNTER — Telehealth: Payer: Self-pay | Admitting: Cardiology

## 2014-11-01 MED ORDER — AMLODIPINE BESYLATE 5 MG PO TABS: 5.0000 mg | ORAL_TABLET | Freq: Every day | ORAL | Status: DC

## 2014-11-01 NOTE — Telephone Encounter (Signed)
This medication was sent in on 11.18.15. Please chart the chart to see if it has been sent in cause it will be document in the chart

## 2014-11-01 NOTE — Telephone Encounter (Signed)
[name] is calling once again stating that her father's Amlodipine prescription has yet to be filled and she has been calling all week. [facility] needs to [facility] at [facility] [address].  The number is [phone]

## 2014-11-01 NOTE — Telephone Encounter (Signed)
Rx was sent to pharmacy electronically. Daughter notified.

## 2014-11-01 NOTE — Telephone Encounter (Signed)
Pt still have not received his Amlodipine. Please call this today to [phone]. Pt have been out of his medicine for 3 days and been trying to 2 weeks to get it.

## 2014-11-04 ENCOUNTER — Telehealth: Payer: Self-pay | Admitting: Hematology

## 2014-11-04 NOTE — Telephone Encounter (Signed)
moved 12/4 appt to 12/11 w/YF. lmonvm for pt and mailed scheduled.

## 2014-11-15 ENCOUNTER — Other Ambulatory Visit: Payer: Medicare Other

## 2014-11-15 ENCOUNTER — Ambulatory Visit: Payer: Medicare Other

## 2014-11-21 ENCOUNTER — Other Ambulatory Visit: Payer: Self-pay | Admitting: *Deleted

## 2014-11-21 DIAGNOSIS — D72819 Decreased white blood cell count, unspecified: Secondary | ICD-10-CM

## 2014-11-21 DIAGNOSIS — Z85048 Personal history of other malignant neoplasm of rectum, rectosigmoid junction, and anus: Secondary | ICD-10-CM

## 2014-11-22 ENCOUNTER — Other Ambulatory Visit (HOSPITAL_BASED_OUTPATIENT_CLINIC_OR_DEPARTMENT_OTHER): Payer: Medicare Other

## 2014-11-22 ENCOUNTER — Ambulatory Visit (HOSPITAL_BASED_OUTPATIENT_CLINIC_OR_DEPARTMENT_OTHER): Payer: Medicare Other | Admitting: Hematology

## 2014-11-22 ENCOUNTER — Encounter: Payer: Self-pay | Admitting: Hematology

## 2014-11-22 ENCOUNTER — Telehealth: Payer: Self-pay | Admitting: Hematology

## 2014-11-22 VITALS — BP 157/66 | HR 66 | Temp 97.6°F | Resp 18 | Ht 75.0 in | Wt 213.1 lb

## 2014-11-22 DIAGNOSIS — C187 Malignant neoplasm of sigmoid colon: Secondary | ICD-10-CM

## 2014-11-22 DIAGNOSIS — D72819 Decreased white blood cell count, unspecified: Secondary | ICD-10-CM

## 2014-11-22 DIAGNOSIS — Z862 Personal history of diseases of the blood and blood-forming organs and certain disorders involving the immune mechanism: Secondary | ICD-10-CM

## 2014-11-22 DIAGNOSIS — D708 Other neutropenia: Secondary | ICD-10-CM

## 2014-11-22 DIAGNOSIS — D700 Congenital agranulocytosis: Secondary | ICD-10-CM

## 2014-11-22 DIAGNOSIS — Z85048 Personal history of other malignant neoplasm of rectum, rectosigmoid junction, and anus: Secondary | ICD-10-CM

## 2014-11-22 DIAGNOSIS — D709 Neutropenia, unspecified: Secondary | ICD-10-CM

## 2014-11-22 LAB — BASIC METABOLIC PANEL (CC13)
Anion Gap: 11 mEq/L (ref 3–11)
BUN: 22.4 mg/dL (ref 7.0–26.0)
CO2: 26 meq/L (ref 22–29)
Calcium: 9.2 mg/dL (ref 8.4–10.4)
Chloride: 103 mEq/L (ref 98–109)
Creatinine: 1.9 mg/dL — ABNORMAL HIGH (ref 0.7–1.3)
EGFR: 33 mL/min/{1.73_m2} — AB (ref >90)
Glucose: 164 mg/dl — ABNORMAL HIGH (ref 70–140)
Potassium: 4.9 mEq/L (ref 3.5–5.1)
SODIUM: 141 meq/L (ref 136–145)

## 2014-11-22 LAB — CBC WITH DIFFERENTIAL/PLATELET
BASO%: 0.7 % (ref 0.0–2.0)
BASOS ABS: 0.1 10*3/uL (ref 0.0–0.1)
EOS ABS: 0.2 10*3/uL (ref 0.0–0.5)
EOS%: 2.3 % (ref 0.0–7.0)
HEMATOCRIT: 34.3 % — AB (ref 38.4–49.9)
HEMOGLOBIN: 10.7 g/dL — AB (ref 13.0–17.1)
LYMPH%: 24.3 % (ref 14.0–49.0)
MCH: 28.2 pg (ref 27.2–33.4)
MCHC: 31.3 g/dL — AB (ref 32.0–36.0)
MCV: 90.1 fL (ref 79.3–98.0)
MONO#: 0.4 10*3/uL (ref 0.1–0.9)
MONO%: 4 % (ref 0.0–14.0)
NEUT#: 7.2 10*3/uL — ABNORMAL HIGH (ref 1.5–6.5)
NEUT%: 68.7 % (ref 39.0–75.0)
Platelets: 225 10*3/uL (ref 140–400)
RBC: 3.81 10*6/uL — ABNORMAL LOW (ref 4.20–5.82)
RDW: 15.2 % — ABNORMAL HIGH (ref 11.0–14.6)
WBC: 10.5 10*3/uL — ABNORMAL HIGH (ref 4.0–10.3)
lymph#: 2.6 10*3/uL (ref 0.9–3.3)

## 2014-11-22 LAB — LACTATE DEHYDROGENASE (CC13): LDH: 169 U/L (ref 125–245)

## 2014-11-22 MED ORDER — FILGRASTIM 300 MCG/ML IJ SOLN: 300.0000 ug | Freq: Every day | INTRAMUSCULAR | Status: DC

## 2014-11-22 NOTE — Telephone Encounter (Signed)
Confirmed all appt for Jan 2016.

## 2014-11-22 NOTE — Progress Notes (Signed)
[facility] OFFICE PROGRESS NOTE  [name] [address]  DIAGNOSIS: Neutropenia  Chief Complaint  Patient presents with  . Follow-up    CURRENT THERAPY:  1. Neupogen 300 mcg subcu, currently 2 times a week. Neupogen was started in late December 2011, he was on 3-4 times weekly before.  2. Cyclosporine 125 mg twice daily. Cyclosporine was started in August 2012.  INTERVAL HISTORY: [name] 80 y.o. male with a history of autoimmune neutropenia is here for follow-up.  He was last seen by Dr. Juliann Mule four month ago.   Today, he is accompanied by his daughter [name]. He has been doing well since last visit, no major medical event or hospitalization. No fever, chill, infection episodes.   MEDICAL HISTORY: Past Medical History  Diagnosis Date  . Aortic root dilatation   . DM2 (diabetes mellitus, type 2)   . Stroke   . HLD (hyperlipidemia)   . HTN (hypertension)   . CAD (coronary artery disease)     Presumed from previously mildy abnormal myoview;  myoview 4/12: EF 73%, no ischemia  . Leukopenia   . Pneumothorax   . Rectal cancer   . Cardiomyopathy     a. echo 5/12: EF 45-50%, mild LVH, grade 2 diast dysfxn, RAE  . Colon cancer   . Chronic kidney disease     renal insuff, increased uric acid  . Seizures   . Diabetes mellitus   . BPH (benign prostatic hyperplasia)     increased PSA  . Anemia    this is a units with unit with Monday 7 so. 5 left lung lesion with the whole outright is something right RIGHT any: RIGHT LUNG A   ALLERGIES:  has No Known Allergies.  MEDICATIONS: has a current medication list which includes the following prescription(s): allopurinol, amlodipine, aspirin, b-d ins syr microfine 1cc/28g, cyclosporine modified, donepezil, doxazosin, filgrastim, finasteride, furosemide, insulin regular, lamotrigine, metoprolol succinate, mirtazapine, nitroglycerin, one touch ultra test, pravastatin, and mupirocin  cream.  SURGICAL HISTORY:  Past Surgical History  Procedure Laterality Date  . Hip arthroplasty    . Tonsillectomy    . S/p resection of rectal cancer    . Vasectomy    . Ileostomy  12/2010  . Reversal ileostomy  03/2011    REVIEW OF SYSTEMS:   Constitutional: Denies fevers, chills or abnormal weight loss Eyes: Denies blurriness of vision Ears, nose, mouth, throat, and face: Denies mucositis or sore throat Respiratory: Denies cough, dyspnea or wheezes Cardiovascular: Denies palpitation, chest discomfort or lower extremity swelling Gastrointestinal:  Denies nausea, heartburn or change in bowel habits Skin: Denies abnormal skin rashes Lymphatics: Denies new lymphadenopathy or easy bruising Neurological:Denies numbness, tingling or new weaknesses Behavioral/Psych: Mood is stable, no new changes  All other systems were reviewed with the patient and are negative.  PHYSICAL EXAMINATION: ECOG PERFORMANCE STATUS: 1  Blood pressure 157/66, pulse 66, temperature 97.6 F (36.4 C), temperature source Oral, resp. rate 18, height 6\' 3"  (1.905 m), weight 213 lb 1.6 oz (96.662 kg), SpO2 99 %.  GENERAL:alert, no distress and comfortable; chronically ill appearing.  SKIN: skin color, texture, turgor are normal, no rashes;  Chronic scaling of skin.  EYES: normal, Conjunctiva are pink and non-injected, sclera clear OROPHARYNX:no exudate, no erythema and lips, buccal mucosa, and tongue normal  NECK: supple, thyroid normal size, non-tender, without nodularity LYMPH:  no palpable lymphadenopathy in the cervical, axillary or supraclavicular LUNGS: clear to auscultation and percussion  with normal breathing effort HEART: regular rate & rhythm and no murmurs and trace lower extremity edema bilaterally. ABDOMEN:abdomen soft, non-tender and normal bowel sounds Musculoskeletal:no cyanosis of digits and no clubbing; R arm with decreased ROM.  NEURO: alert & oriented x 3 with fluent speech, no focal  motor/sensory deficits  LABORATORY DATA: CBC Latest Ref Rng 11/22/2014 09/20/2014 08/09/2014  WBC 4.0 - 10.3 10e3/uL 10.5(H) 9.5 10.4(H)  Hemoglobin 13.0 - 17.1 g/dL 10.7(L) 10.8(L) 10.7(L)  Hematocrit 38.4 - 49.9 % 34.3(L) 33.4(L) 33.6(L)  Platelets 140 - 400 10e3/uL 225 217 223    CMP Latest Ref Rng 11/22/2014 09/20/2014 08/09/2014  Glucose 70 - 140 mg/dl 164(H) 207(H) 135  BUN 7.0 - 26.0 mg/dL 22.4 27.1(H) 24.3  Creatinine 0.7 - 1.3 mg/dL 1.9(H) 2.1(H) 1.8(H)  Sodium 136 - 145 mEq/L 141 139 138  Potassium 3.5 - 5.1 mEq/L 4.9 4.5 5.0  Chloride 96 - 112 mEq/L - - -  CO2 22 - 29 mEq/L 26 28 28   Calcium 8.4 - 10.4 mg/dL 9.2 9.1 9.2  Total Protein 6.4 - 8.3 g/dL - 7.0 7.2  Total Bilirubin 0.20 - 1.20 mg/dL - 0.58 0.68  Alkaline Phos 40 - 150 U/L - 126 134  AST 5 - 34 U/L - 13 11  ALT 0 - 55 U/L - 8 9     RADIOGRAPHIC STUDIES: No results found.  ASSESSMENT: [name] 80 y.o. male with a history of  1. Autoimmune neutropenia. - He continues to do well overall.  His current dose of neupogen is 300 mcg SQ 2 days weeks on Tuesday and Friday. His WBC today is 10.5 (before today's neupogen dose) -I recommend him to change Neupogen once a week (on Friday) after the Christmas and the new year holiday. We'll check his blood counts weekly for the first to 3 weeks when he reduced his Neupogen dose. He agrees ---We will continue cyclosporine at the same dose 125 mg twice a day. The level was checked, but unfortunately was not trough level. I told him to hold the cyclosporine morning dose when he returns next time and check his level again.   2. S/p Fall with R shoulder injury. --He completed physical therapy.    3. Sigmoid Colon adenocarcinoma.  - Colonoscopy carried out by Dr. Verl Blalock 12/22/2011 without significant finding. Last CEA level is 2.7 on 02/12/2014.  -continue follow up  4. Anemia.  - Likely related to mild renal insufficiency. stable  5. Follow-up.  -He will  return on 01/03/2015. We'll check his CBC and a trough cyclosporin level.  All questions were answered. The patient knows to call the clinic with any problems, questions or concerns. We can certainly see the patient much sooner if necessary.  I spent 15 minutes counseling the patient face to face. The total time spent in the appointment was 25 minutes.    Truitt Merle, MD 11/22/2014 9:55 AM

## 2014-11-23 ENCOUNTER — Other Ambulatory Visit: Payer: Self-pay | Admitting: Internal Medicine

## 2014-11-24 LAB — CYCLOSPORINE LEVEL: Cyclosporine, Blood: 99 ng/mL — ABNORMAL LOW (ref 140–330)

## 2014-11-27 ENCOUNTER — Other Ambulatory Visit: Payer: Self-pay | Admitting: *Deleted

## 2014-11-27 NOTE — Telephone Encounter (Signed)
[id number] called re: refill on allopurinol.  Discussed with Dr. Burr Medico who saw him 11-22-14.  He is taking this for his gout and this should be refilled by his PCP- Dr. Everlene Farrier.  Called pharmacist and let him know.  They will call Dr. Everlene Farrier

## 2014-11-29 ENCOUNTER — Other Ambulatory Visit: Payer: Self-pay | Admitting: Internal Medicine

## 2014-12-02 ENCOUNTER — Ambulatory Visit (INDEPENDENT_AMBULATORY_CARE_PROVIDER_SITE_OTHER): Payer: Medicare Other | Admitting: Podiatry

## 2014-12-02 ENCOUNTER — Encounter: Payer: Self-pay | Admitting: Podiatry

## 2014-12-02 DIAGNOSIS — B351 Tinea unguium: Secondary | ICD-10-CM

## 2014-12-02 DIAGNOSIS — G629 Polyneuropathy, unspecified: Secondary | ICD-10-CM

## 2014-12-02 DIAGNOSIS — E1142 Type 2 diabetes mellitus with diabetic polyneuropathy: Secondary | ICD-10-CM

## 2014-12-02 NOTE — Progress Notes (Signed)
Patient ID: [name], male   DOB: 22-Feb-1932, 80 y.o.   MRN: [id number]  Subjective: Patient presents today requesting toenail debridement  Objective: The toenails are incurvated, discolored, hypertrophic 6-10  Assessment: Onychomycoses 6-10  diabetic peripheral neuropathy type II  Plan: Debrided toenails 10 without a bleeding  Reappoint 3 months

## 2014-12-17 DIAGNOSIS — R2689 Other abnormalities of gait and mobility: Secondary | ICD-10-CM | POA: Diagnosis not present

## 2014-12-18 ENCOUNTER — Other Ambulatory Visit: Payer: Self-pay | Admitting: Emergency Medicine

## 2014-12-19 DIAGNOSIS — R2689 Other abnormalities of gait and mobility: Secondary | ICD-10-CM | POA: Diagnosis not present

## 2014-12-20 ENCOUNTER — Other Ambulatory Visit: Payer: Medicare Other

## 2014-12-20 ENCOUNTER — Other Ambulatory Visit (HOSPITAL_BASED_OUTPATIENT_CLINIC_OR_DEPARTMENT_OTHER): Payer: Medicare Other

## 2014-12-20 DIAGNOSIS — D72819 Decreased white blood cell count, unspecified: Secondary | ICD-10-CM

## 2014-12-20 DIAGNOSIS — D708 Other neutropenia: Secondary | ICD-10-CM | POA: Diagnosis not present

## 2014-12-20 DIAGNOSIS — I2510 Atherosclerotic heart disease of native coronary artery without angina pectoris: Secondary | ICD-10-CM

## 2014-12-20 DIAGNOSIS — D709 Neutropenia, unspecified: Secondary | ICD-10-CM

## 2014-12-20 DIAGNOSIS — Z862 Personal history of diseases of the blood and blood-forming organs and certain disorders involving the immune mechanism: Secondary | ICD-10-CM

## 2014-12-20 DIAGNOSIS — C187 Malignant neoplasm of sigmoid colon: Secondary | ICD-10-CM

## 2014-12-20 LAB — CBC WITH DIFFERENTIAL/PLATELET
BASO%: 0.4 % (ref 0.0–2.0)
Basophils Absolute: 0 10*3/uL (ref 0.0–0.1)
EOS%: 1.5 % (ref 0.0–7.0)
Eosinophils Absolute: 0.2 10*3/uL (ref 0.0–0.5)
HEMATOCRIT: 34 % — AB (ref 38.4–49.9)
HEMOGLOBIN: 10.8 g/dL — AB (ref 13.0–17.1)
LYMPH#: 3.1 10*3/uL (ref 0.9–3.3)
LYMPH%: 32 % (ref 14.0–49.0)
MCH: 28.6 pg (ref 27.2–33.4)
MCHC: 31.8 g/dL — ABNORMAL LOW (ref 32.0–36.0)
MCV: 89.9 fL (ref 79.3–98.0)
MONO#: 0.4 10*3/uL (ref 0.1–0.9)
MONO%: 4.3 % (ref 0.0–14.0)
NEUT#: 6 10*3/uL (ref 1.5–6.5)
NEUT%: 61.8 % (ref 39.0–75.0)
Platelets: 241 10*3/uL (ref 140–400)
RBC: 3.78 10*6/uL — ABNORMAL LOW (ref 4.20–5.82)
RDW: 14.8 % — ABNORMAL HIGH (ref 11.0–14.6)
WBC: 9.8 10*3/uL (ref 4.0–10.3)

## 2014-12-22 ENCOUNTER — Other Ambulatory Visit: Payer: Self-pay | Admitting: Physician Assistant

## 2014-12-22 LAB — CYCLOSPORINE LEVEL: Cyclosporine, Blood: 298 ng/mL (ref 140–330)

## 2014-12-23 ENCOUNTER — Other Ambulatory Visit: Payer: Self-pay | Admitting: Physician Assistant

## 2014-12-25 ENCOUNTER — Other Ambulatory Visit: Payer: Self-pay | Admitting: Physician Assistant

## 2014-12-25 DIAGNOSIS — D485 Neoplasm of uncertain behavior of skin: Secondary | ICD-10-CM | POA: Diagnosis not present

## 2014-12-25 DIAGNOSIS — L570 Actinic keratosis: Secondary | ICD-10-CM | POA: Diagnosis not present

## 2014-12-27 ENCOUNTER — Other Ambulatory Visit: Payer: Medicare Other

## 2014-12-27 ENCOUNTER — Other Ambulatory Visit (HOSPITAL_BASED_OUTPATIENT_CLINIC_OR_DEPARTMENT_OTHER): Payer: Medicare Other

## 2014-12-27 DIAGNOSIS — D72819 Decreased white blood cell count, unspecified: Secondary | ICD-10-CM

## 2014-12-27 DIAGNOSIS — D709 Neutropenia, unspecified: Secondary | ICD-10-CM

## 2014-12-27 DIAGNOSIS — C187 Malignant neoplasm of sigmoid colon: Secondary | ICD-10-CM | POA: Diagnosis not present

## 2014-12-27 DIAGNOSIS — D708 Other neutropenia: Secondary | ICD-10-CM

## 2014-12-27 DIAGNOSIS — Z862 Personal history of diseases of the blood and blood-forming organs and certain disorders involving the immune mechanism: Secondary | ICD-10-CM

## 2014-12-27 LAB — CBC WITH DIFFERENTIAL/PLATELET
BASO%: 0.7 % (ref 0.0–2.0)
Basophils Absolute: 0 10*3/uL (ref 0.0–0.1)
EOS%: 2.8 % (ref 0.0–7.0)
Eosinophils Absolute: 0.2 10*3/uL (ref 0.0–0.5)
HCT: 33.7 % — ABNORMAL LOW (ref 38.4–49.9)
HGB: 10.6 g/dL — ABNORMAL LOW (ref 13.0–17.1)
LYMPH%: 36.8 % (ref 14.0–49.0)
MCH: 28.2 pg (ref 27.2–33.4)
MCHC: 31.5 g/dL — ABNORMAL LOW (ref 32.0–36.0)
MCV: 89.6 fL (ref 79.3–98.0)
MONO#: 0.4 10*3/uL (ref 0.1–0.9)
MONO%: 6.2 % (ref 0.0–14.0)
NEUT#: 3 10*3/uL (ref 1.5–6.5)
NEUT%: 53.5 % (ref 39.0–75.0)
Platelets: 215 10*3/uL (ref 140–400)
RBC: 3.76 10*6/uL — ABNORMAL LOW (ref 4.20–5.82)
RDW: 14.3 % (ref 11.0–14.6)
WBC: 5.7 10*3/uL (ref 4.0–10.3)
lymph#: 2.1 10*3/uL (ref 0.9–3.3)

## 2014-12-31 DIAGNOSIS — R2689 Other abnormalities of gait and mobility: Secondary | ICD-10-CM | POA: Diagnosis not present

## 2015-01-02 ENCOUNTER — Encounter: Payer: Self-pay | Admitting: Internal Medicine

## 2015-01-03 ENCOUNTER — Ambulatory Visit: Payer: Medicare Other | Admitting: Hematology

## 2015-01-03 ENCOUNTER — Other Ambulatory Visit: Payer: Medicare Other

## 2015-01-03 ENCOUNTER — Telehealth: Payer: Self-pay | Admitting: Hematology

## 2015-01-03 ENCOUNTER — Other Ambulatory Visit: Payer: Self-pay | Admitting: *Deleted

## 2015-01-03 ENCOUNTER — Telehealth: Payer: Self-pay | Admitting: *Deleted

## 2015-01-03 DIAGNOSIS — C187 Malignant neoplasm of sigmoid colon: Secondary | ICD-10-CM

## 2015-01-03 DIAGNOSIS — D72819 Decreased white blood cell count, unspecified: Secondary | ICD-10-CM

## 2015-01-03 DIAGNOSIS — D708 Other neutropenia: Secondary | ICD-10-CM

## 2015-01-03 NOTE — Telephone Encounter (Signed)
Confirm appointment reschedule from 1/22 to 2/02

## 2015-01-03 NOTE — Telephone Encounter (Signed)
Called pt back and left message on voice mail re:  Reminded daughter [name] to have pt Hold Cyclosporine dose the morning of his appts -  Pt to have  Cyclosporine  Trough  Level drawn along with CBC. Asked [name] to call office to confirm that she received nurse message.

## 2015-01-03 NOTE — Telephone Encounter (Signed)
Pt was NO SHOW for appt with Dr. Burr Medico today.  Called pt at home and spoke with family member.  Informed family member that a scheduler will contact her with rescheduled appts.

## 2015-01-05 ENCOUNTER — Other Ambulatory Visit: Payer: Self-pay | Admitting: Physician Assistant

## 2015-01-08 DIAGNOSIS — R2689 Other abnormalities of gait and mobility: Secondary | ICD-10-CM | POA: Diagnosis not present

## 2015-01-09 DIAGNOSIS — R2689 Other abnormalities of gait and mobility: Secondary | ICD-10-CM | POA: Diagnosis not present

## 2015-01-14 ENCOUNTER — Other Ambulatory Visit (HOSPITAL_BASED_OUTPATIENT_CLINIC_OR_DEPARTMENT_OTHER): Payer: Medicare Other

## 2015-01-14 ENCOUNTER — Ambulatory Visit (INDEPENDENT_AMBULATORY_CARE_PROVIDER_SITE_OTHER): Payer: Medicare Other | Admitting: Emergency Medicine

## 2015-01-14 ENCOUNTER — Encounter: Payer: Self-pay | Admitting: Hematology

## 2015-01-14 ENCOUNTER — Telehealth: Payer: Self-pay | Admitting: Hematology

## 2015-01-14 ENCOUNTER — Ambulatory Visit (HOSPITAL_BASED_OUTPATIENT_CLINIC_OR_DEPARTMENT_OTHER): Payer: Medicare Other | Admitting: Hematology

## 2015-01-14 ENCOUNTER — Other Ambulatory Visit: Payer: Self-pay | Admitting: Emergency Medicine

## 2015-01-14 ENCOUNTER — Encounter: Payer: Self-pay | Admitting: Emergency Medicine

## 2015-01-14 VITALS — BP 134/66 | HR 73 | Temp 97.7°F | Resp 16 | Ht 74.0 in | Wt 210.2 lb

## 2015-01-14 VITALS — BP 150/63 | HR 71 | Temp 97.8°F | Resp 18 | Ht 75.0 in | Wt 210.8 lb

## 2015-01-14 DIAGNOSIS — D649 Anemia, unspecified: Secondary | ICD-10-CM | POA: Diagnosis not present

## 2015-01-14 DIAGNOSIS — D700 Congenital agranulocytosis: Secondary | ICD-10-CM | POA: Diagnosis not present

## 2015-01-14 DIAGNOSIS — D72819 Decreased white blood cell count, unspecified: Secondary | ICD-10-CM

## 2015-01-14 DIAGNOSIS — E1121 Type 2 diabetes mellitus with diabetic nephropathy: Secondary | ICD-10-CM

## 2015-01-14 DIAGNOSIS — C187 Malignant neoplasm of sigmoid colon: Secondary | ICD-10-CM | POA: Diagnosis not present

## 2015-01-14 DIAGNOSIS — D709 Neutropenia, unspecified: Secondary | ICD-10-CM

## 2015-01-14 DIAGNOSIS — R569 Unspecified convulsions: Secondary | ICD-10-CM | POA: Insufficient documentation

## 2015-01-14 DIAGNOSIS — D708 Other neutropenia: Secondary | ICD-10-CM

## 2015-01-14 DIAGNOSIS — D638 Anemia in other chronic diseases classified elsewhere: Secondary | ICD-10-CM

## 2015-01-14 DIAGNOSIS — Z862 Personal history of diseases of the blood and blood-forming organs and certain disorders involving the immune mechanism: Secondary | ICD-10-CM

## 2015-01-14 LAB — CBC WITH DIFFERENTIAL/PLATELET
BASO%: 0.7 % (ref 0.0–2.0)
Basophils Absolute: 0.1 10*3/uL (ref 0.0–0.1)
EOS ABS: 0.2 10*3/uL (ref 0.0–0.5)
EOS%: 3.2 % (ref 0.0–7.0)
HEMATOCRIT: 34.3 % — AB (ref 38.4–49.9)
HEMOGLOBIN: 10.7 g/dL — AB (ref 13.0–17.1)
LYMPH%: 36.2 % (ref 14.0–49.0)
MCH: 28.2 pg (ref 27.2–33.4)
MCHC: 31.2 g/dL — ABNORMAL LOW (ref 32.0–36.0)
MCV: 90.5 fL (ref 79.3–98.0)
MONO#: 0.4 10*3/uL (ref 0.1–0.9)
MONO%: 5.4 % (ref 0.0–14.0)
NEUT#: 3.9 10*3/uL (ref 1.5–6.5)
NEUT%: 54.5 % (ref 39.0–75.0)
PLATELETS: 223 10*3/uL (ref 140–400)
RBC: 3.79 10*6/uL — ABNORMAL LOW (ref 4.20–5.82)
RDW: 14.4 % (ref 11.0–14.6)
WBC: 7.2 10*3/uL (ref 4.0–10.3)
lymph#: 2.6 10*3/uL (ref 0.9–3.3)

## 2015-01-14 LAB — POCT GLYCOSYLATED HEMOGLOBIN (HGB A1C): Hemoglobin A1C: 6

## 2015-01-14 LAB — CYCLOSPORINE LEVEL: Cyclosporine, Blood: 64 ng/mL — ABNORMAL LOW (ref 140–330)

## 2015-01-14 NOTE — Telephone Encounter (Signed)
gv and printed appt sched and avs for pt for March and April °

## 2015-01-14 NOTE — Progress Notes (Addendum)
   Subjective:    Patient ID: [name], male    DOB: 05/04/1932, 81 y.o.   MRN: [id number] This chart was scribed for [name], MD by Zola Button, Medical Scribe. This patient was seen in room 23 and the patient's care was started at 2:27 PM.   HPI HPI Comments: [name] is a 81 y.o. male with a hx of DM, adenocarcinoma of sigmoid colon, and right inguinal hernia who presents to the [facility] for a follow-up. He states he has been doing well. His hernia has not been causing him pain, and he has had a consultation about surgery. He had a single, brief seizure on 01/01/15; he will see his neurologist soon. Patient denies chest pain, SOB and abdominal pain.    Review of Systems  Respiratory: Negative for shortness of breath.   Cardiovascular: Negative for chest pain.  Gastrointestinal: Negative for abdominal pain.  Neurological: Positive for seizures.       Objective:   Physical Exam CONSTITUTIONAL: Alert and cooperative. HEAD: Normocephalic/atraumatic EYES: EOM/PERRL ENMT: Mucous membranes moist NECK: supple no meningeal signs SPINE: entire spine nontender CV: S1/S2 noted, no murmurs/rubs/gallops noted. Heart normal. LUNGS: Lungs are clear to auscultation bilaterally, no apparent distress ABDOMEN: Large right inguinal hernia. GU: no cva tenderness NEURO: Decreased sensation in both feet, that seems to be worse in left consistent with peripheral neuropathy related to his diabetes. EXTREMITIES: pulses normal, full ROM SKIN: warm, color normal PSYCH: no abnormalities of mood noted Results for orders placed or performed in visit on 01/14/15  POCT glycosylated hemoglobin (Hb A1C)  Result Value Ref Range   Hemoglobin A1C 6.0          Assessment & Plan:  Patient is on a sliding scale insulin that started 150 sugar. He had been getting 8 units for this level. I told her to decrease each insulin injection by 2 units so that we would avoid  hypoglycemia. I told her it was absolutely essential he avoid low sugar. He is to continue his physical therapy. He has had no hypoglycemic spells and no falls. He had been on sliding scale insulin since February 2012.  New sliding scale.  100-150 no insulin .  150-200 give 6 units insulin.  200-250 give 8 units insulin.  250-300 give  10 units insulin.  300-350 give 12 units insulin.  350-400 give 14  units I need to decrease the amount of insulin to avoid hypoglycemia. He is going to go to therapy 3 times a week to work on core muscle strength. His last hemoglobin A1c was 6.9. Hemoglobin A1c today is 6.0 which I believe is to tight of control. We'll recheck in 3 months.

## 2015-01-14 NOTE — Progress Notes (Signed)
[facility] OFFICE PROGRESS NOTE  [name] Reichmann, MD [place] [address]  DIAGNOSIS: Neutropenia  No chief complaint on file.   CURRENT THERAPY:  1. Neupogen 300 mcg subcu,  started in late December 2011, he was on 3-4 times weekly initially, and reduced to once weekly (Tuesday) since 12/13/2014  2. Cyclosporine 125 mg twice daily. Cyclosporine was started in August 2012.  INTERVAL HISTORY: [name] 81 y.o. male with a history of autoimmune neutropenia is here for follow-up.  He is doing well overall, no fever or episode of infection since last visit. He has been on Neupogen injection once a weekly for the last month. He functions well at home. He came in with his daughter today.    MEDICAL HISTORY: Past Medical History  Diagnosis Date  . Aortic root dilatation   . DM2 (diabetes mellitus, type 2)   . Stroke   . HLD (hyperlipidemia)   . HTN (hypertension)   . CAD (coronary artery disease)     Presumed from previously mildy abnormal myoview;  myoview 4/12: EF 73%, no ischemia  . Leukopenia   . Pneumothorax   . Rectal cancer   . Cardiomyopathy     a. echo 5/12: EF 45-50%, mild LVH, grade 2 diast dysfxn, RAE  . Colon cancer   . Chronic kidney disease     renal insuff, increased uric acid  . Seizures   . Diabetes mellitus   . BPH (benign prostatic hyperplasia)     increased PSA  . Anemia      ALLERGIES:  has No Known Allergies.  MEDICATIONS: has a current medication list which includes the following prescription(s): allopurinol, amlodipine, aspirin, b-d ins syr microfine 1cc/28g, cyclosporine modified, donepezil, doxazosin, filgrastim, finasteride, finasteride, furosemide, insulin regular, lamotrigine, metoprolol succinate, mirtazapine, mupirocin cream, nitroglycerin, one touch ultra test, pravastatin, and pravastatin.  SURGICAL HISTORY:  Past Surgical History  Procedure Laterality Date  . Hip arthroplasty    . Tonsillectomy    .  S/p resection of rectal cancer    . Vasectomy    . Ileostomy  12/2010  . Reversal ileostomy  03/2011    REVIEW OF SYSTEMS:   Constitutional: Denies fevers, chills or abnormal weight loss Eyes: Denies blurriness of vision Ears, nose, mouth, throat, and face: Denies mucositis or sore throat Respiratory: Denies cough, dyspnea or wheezes Cardiovascular: Denies palpitation, chest discomfort or lower extremity swelling Gastrointestinal:  Denies nausea, heartburn or change in bowel habits Skin: Denies abnormal skin rashes Lymphatics: Denies new lymphadenopathy or easy bruising Neurological:Denies numbness, tingling or new weaknesses Behavioral/Psych: Mood is stable, no new changes  All other systems were reviewed with the patient and are negative.  PHYSICAL EXAMINATION: ECOG PERFORMANCE STATUS: 1  Blood pressure 150/63, pulse 71, temperature 97.8 F (36.6 C), temperature source Oral, resp. rate 18, height 6\' 3"  (1.905 m), weight 210 lb 12.8 oz (95.618 kg), SpO2 98 %.  GENERAL:alert, no distress and comfortable; chronically ill appearing.  SKIN: skin color, texture, turgor are normal, no rashes;  Chronic scaling of skin.  EYES: normal, Conjunctiva are pink and non-injected, sclera clear OROPHARYNX:no exudate, no erythema and lips, buccal mucosa, and tongue normal  NECK: supple, thyroid normal size, non-tender, without nodularity LYMPH:  no palpable lymphadenopathy in the cervical, axillary or supraclavicular LUNGS: clear to auscultation and percussion with normal breathing effort HEART: regular rate & rhythm and no murmurs and trace lower extremity edema bilaterally. ABDOMEN:abdomen soft, non-tender and normal bowel sounds Musculoskeletal:no  cyanosis of digits and no clubbing; R arm with decreased ROM.  NEURO: alert & oriented x 3 with fluent speech, no focal motor/sensory deficits  LABORATORY DATA: CBC Latest Ref Rng 01/14/2015 12/27/2014 12/20/2014  WBC 4.0 - 10.3 10e3/uL 7.2 5.7 9.8   Hemoglobin 13.0 - 17.1 g/dL 10.7(L) 10.6(L) 10.8(L)  Hematocrit 38.4 - 49.9 % 34.3(L) 33.7(L) 34.0(L)  Platelets 140 - 400 10e3/uL 223 215 241    CMP Latest Ref Rng 11/22/2014 09/20/2014 08/09/2014  Glucose 70 - 140 mg/dl 164(H) 207(H) 135  BUN 7.0 - 26.0 mg/dL 22.4 27.1(H) 24.3  Creatinine 0.7 - 1.3 mg/dL 1.9(H) 2.1(H) 1.8(H)  Sodium 136 - 145 mEq/L 141 139 138  Potassium 3.5 - 5.1 mEq/L 4.9 4.5 5.0  Chloride 96 - 112 mEq/L - - -  CO2 22 - 29 mEq/L 26 28 28   Calcium 8.4 - 10.4 mg/dL 9.2 9.1 9.2  Total Protein 6.4 - 8.3 g/dL - 7.0 7.2  Total Bilirubin 0.20 - 1.20 mg/dL - 0.58 0.68  Alkaline Phos 40 - 150 U/L - 126 134  AST 5 - 34 U/L - 13 11  ALT 0 - 55 U/L - 8 9     RADIOGRAPHIC STUDIES: No results found.  ASSESSMENT: [name] 80 y.o. male with a history of  1. Autoimmune neutropenia. - He continues to do well overall.  His current dose of neupogen is 300 mcg SQ weekly (Tuesday). His WBC today is 7.2 with ANC 3.9 (before today's neupogen dose) -will continue current neupogen dose  ---We will continue cyclosporine at the same dose 125 mg twice a day. The through level was checked today and result is still pending.   2. S/p Fall with R shoulder injury. --He completed physical therapy.    3. Sigmoid Colon adenocarcinoma.  - Colonoscopy carried out by Dr. Verl Blalock 12/22/2011 without significant finding. Last CEA level is 2.7 on 02/12/2014.  -continue follow up  4. Anemia.  - Likely related to mild renal insufficiency. Stable -We discussed the role of Aranesp for anemia of chronic disease related to CKD. He is asymptomatic, will hold on for now.   5. Follow-up.  -CBC every 4 weeks, and RTC in 8 weeks.   All questions were answered. The patient knows to call the clinic with any problems, questions or concerns. We can certainly see the patient much sooner if necessary.  I spent 15 minutes counseling the patient face to face. The total time spent in the  appointment was 25 minutes.    Truitt Merle, MD 01/14/2015  9:31 AMy

## 2015-01-15 DIAGNOSIS — R2689 Other abnormalities of gait and mobility: Secondary | ICD-10-CM | POA: Diagnosis not present

## 2015-01-16 DIAGNOSIS — G301 Alzheimer's disease with late onset: Secondary | ICD-10-CM | POA: Diagnosis not present

## 2015-01-16 DIAGNOSIS — R2689 Other abnormalities of gait and mobility: Secondary | ICD-10-CM | POA: Diagnosis not present

## 2015-01-16 DIAGNOSIS — G40309 Generalized idiopathic epilepsy and epileptic syndromes, not intractable, without status epilepticus: Secondary | ICD-10-CM | POA: Diagnosis not present

## 2015-01-16 DIAGNOSIS — R262 Difficulty in walking, not elsewhere classified: Secondary | ICD-10-CM | POA: Diagnosis not present

## 2015-01-17 ENCOUNTER — Telehealth: Payer: Self-pay | Admitting: Hematology

## 2015-01-17 NOTE — Telephone Encounter (Signed)
I called pt and left a message at his home number regarding his cyclosporine level. Also the level is low, he has been doing well, his neutrophil count and normal. I instructed him to continue the current cyclosporine dose.  Truitt Merle  01/17/2015

## 2015-01-20 DIAGNOSIS — R2689 Other abnormalities of gait and mobility: Secondary | ICD-10-CM | POA: Diagnosis not present

## 2015-01-22 DIAGNOSIS — R2689 Other abnormalities of gait and mobility: Secondary | ICD-10-CM | POA: Diagnosis not present

## 2015-01-23 DIAGNOSIS — C44622 Squamous cell carcinoma of skin of right upper limb, including shoulder: Secondary | ICD-10-CM | POA: Diagnosis not present

## 2015-01-23 DIAGNOSIS — C44621 Squamous cell carcinoma of skin of unspecified upper limb, including shoulder: Secondary | ICD-10-CM | POA: Diagnosis not present

## 2015-01-23 DIAGNOSIS — C44722 Squamous cell carcinoma of skin of right lower limb, including hip: Secondary | ICD-10-CM | POA: Diagnosis not present

## 2015-01-23 DIAGNOSIS — C44721 Squamous cell carcinoma of skin of unspecified lower limb, including hip: Secondary | ICD-10-CM | POA: Diagnosis not present

## 2015-01-24 DIAGNOSIS — R2689 Other abnormalities of gait and mobility: Secondary | ICD-10-CM | POA: Diagnosis not present

## 2015-01-26 ENCOUNTER — Other Ambulatory Visit: Payer: Self-pay | Admitting: Physician Assistant

## 2015-01-28 DIAGNOSIS — R2689 Other abnormalities of gait and mobility: Secondary | ICD-10-CM | POA: Diagnosis not present

## 2015-01-28 NOTE — Telephone Encounter (Signed)
Dr Everlene Farrier, you have just seen pt and he comes in reg, but don't see Lasix discussed recently. OK to give RFs?

## 2015-01-30 ENCOUNTER — Telehealth: Payer: Self-pay

## 2015-01-30 NOTE — Telephone Encounter (Signed)
I believe pt wants to hear this from you.

## 2015-01-30 NOTE — Telephone Encounter (Signed)
Patients daughter called stating that her dad does not believe Dr Everlene Farrier told him to have therapy 3x / week. He wants Dr Everlene Farrier to tell him directly

## 2015-01-31 DIAGNOSIS — R2689 Other abnormalities of gait and mobility: Secondary | ICD-10-CM | POA: Diagnosis not present

## 2015-01-31 NOTE — Telephone Encounter (Signed)
Left message for pt to call back  °

## 2015-01-31 NOTE — Telephone Encounter (Signed)
When he was in the office I told him face-to-face he needed to have therapy 3 times a week please call and verify this. if there is still a problem like me know and I will talk to him directly

## 2015-02-04 DIAGNOSIS — R2689 Other abnormalities of gait and mobility: Secondary | ICD-10-CM | POA: Diagnosis not present

## 2015-02-06 DIAGNOSIS — R2689 Other abnormalities of gait and mobility: Secondary | ICD-10-CM | POA: Diagnosis not present

## 2015-02-09 ENCOUNTER — Other Ambulatory Visit: Payer: Self-pay | Admitting: Physician Assistant

## 2015-02-11 DIAGNOSIS — R2689 Other abnormalities of gait and mobility: Secondary | ICD-10-CM | POA: Diagnosis not present

## 2015-02-11 NOTE — Telephone Encounter (Signed)
Dr Everlene Farrier, you just saw pt for check up but don't see this discussed. OK to RF?

## 2015-02-12 ENCOUNTER — Other Ambulatory Visit (HOSPITAL_BASED_OUTPATIENT_CLINIC_OR_DEPARTMENT_OTHER): Payer: Medicare Other

## 2015-02-12 DIAGNOSIS — C187 Malignant neoplasm of sigmoid colon: Secondary | ICD-10-CM | POA: Diagnosis not present

## 2015-02-12 DIAGNOSIS — D708 Other neutropenia: Secondary | ICD-10-CM

## 2015-02-12 DIAGNOSIS — Z862 Personal history of diseases of the blood and blood-forming organs and certain disorders involving the immune mechanism: Secondary | ICD-10-CM

## 2015-02-12 DIAGNOSIS — D72819 Decreased white blood cell count, unspecified: Secondary | ICD-10-CM

## 2015-02-12 DIAGNOSIS — D709 Neutropenia, unspecified: Secondary | ICD-10-CM

## 2015-02-12 LAB — CBC WITH DIFFERENTIAL/PLATELET
BASO%: 0.4 % (ref 0.0–2.0)
Basophils Absolute: 0.1 10*3/uL (ref 0.0–0.1)
EOS%: 1.1 % (ref 0.0–7.0)
Eosinophils Absolute: 0.3 10*3/uL (ref 0.0–0.5)
HCT: 32.4 % — ABNORMAL LOW (ref 38.4–49.9)
HGB: 10.1 g/dL — ABNORMAL LOW (ref 13.0–17.1)
LYMPH#: 3 10*3/uL (ref 0.9–3.3)
LYMPH%: 13.4 % — ABNORMAL LOW (ref 14.0–49.0)
MCH: 27.5 pg (ref 27.2–33.4)
MCHC: 31.1 g/dL — ABNORMAL LOW (ref 32.0–36.0)
MCV: 88.6 fL (ref 79.3–98.0)
MONO#: 0.5 10*3/uL (ref 0.1–0.9)
MONO%: 2.2 % (ref 0.0–14.0)
NEUT#: 18.5 10*3/uL — ABNORMAL HIGH (ref 1.5–6.5)
NEUT%: 82.9 % — ABNORMAL HIGH (ref 39.0–75.0)
Platelets: 248 10*3/uL (ref 140–400)
RBC: 3.66 10*6/uL — ABNORMAL LOW (ref 4.20–5.82)
RDW: 15.9 % — ABNORMAL HIGH (ref 11.0–14.6)
WBC: 22.3 10*3/uL — AB (ref 4.0–10.3)

## 2015-02-13 ENCOUNTER — Other Ambulatory Visit: Payer: Self-pay | Admitting: Emergency Medicine

## 2015-02-14 DIAGNOSIS — R2689 Other abnormalities of gait and mobility: Secondary | ICD-10-CM | POA: Diagnosis not present

## 2015-02-17 ENCOUNTER — Other Ambulatory Visit: Payer: Self-pay | Admitting: Emergency Medicine

## 2015-02-18 DIAGNOSIS — R2689 Other abnormalities of gait and mobility: Secondary | ICD-10-CM | POA: Diagnosis not present

## 2015-02-19 ENCOUNTER — Other Ambulatory Visit: Payer: Self-pay | Admitting: Emergency Medicine

## 2015-02-20 DIAGNOSIS — R2689 Other abnormalities of gait and mobility: Secondary | ICD-10-CM | POA: Diagnosis not present

## 2015-02-25 DIAGNOSIS — R2689 Other abnormalities of gait and mobility: Secondary | ICD-10-CM | POA: Diagnosis not present

## 2015-02-27 DIAGNOSIS — R2689 Other abnormalities of gait and mobility: Secondary | ICD-10-CM | POA: Diagnosis not present

## 2015-03-03 ENCOUNTER — Ambulatory Visit (INDEPENDENT_AMBULATORY_CARE_PROVIDER_SITE_OTHER): Payer: Medicare Other | Admitting: Internal Medicine

## 2015-03-03 ENCOUNTER — Ambulatory Visit (INDEPENDENT_AMBULATORY_CARE_PROVIDER_SITE_OTHER): Payer: Medicare Other | Admitting: Podiatry

## 2015-03-03 ENCOUNTER — Encounter: Payer: Self-pay | Admitting: Internal Medicine

## 2015-03-03 ENCOUNTER — Encounter: Payer: Self-pay | Admitting: Podiatry

## 2015-03-03 VITALS — BP 130/72 | HR 72 | Ht 74.0 in | Wt 221.4 lb

## 2015-03-03 DIAGNOSIS — Z794 Long term (current) use of insulin: Secondary | ICD-10-CM

## 2015-03-03 DIAGNOSIS — B351 Tinea unguium: Secondary | ICD-10-CM

## 2015-03-03 DIAGNOSIS — Z85038 Personal history of other malignant neoplasm of large intestine: Secondary | ICD-10-CM | POA: Diagnosis not present

## 2015-03-03 DIAGNOSIS — E1142 Type 2 diabetes mellitus with diabetic polyneuropathy: Secondary | ICD-10-CM | POA: Diagnosis not present

## 2015-03-03 DIAGNOSIS — G629 Polyneuropathy, unspecified: Secondary | ICD-10-CM

## 2015-03-03 NOTE — Patient Instructions (Signed)
Apply topical antibiotic ointment to the  End of theright toe  After removing Band-Aid for 2 days  Diabetes and Foot Care Diabetes may cause you to have problems because of poor blood supply (circulation) to your feet and legs. This may cause the skin on your feet to become thinner, break easier, and heal more slowly. Your skin may become dry, and the skin may peel and crack. You may also have nerve damage in your legs and feet causing decreased feeling in them. You may not notice minor injuries to your feet that could lead to infections or more serious problems. Taking care of your feet is one of the most important things you can do for yourself.  HOME CARE INSTRUCTIONS  Wear shoes at all times, even in the house. Do not go barefoot. Bare feet are easily injured.  Check your feet daily for blisters, cuts, and redness. If you cannot see the bottom of your feet, use a mirror or ask someone for help.  Wash your feet with warm water (do not use hot water) and mild soap. Then pat your feet and the areas between your toes until they are completely dry. Do not soak your feet as this can dry your skin.  Apply a moisturizing lotion or petroleum jelly (that does not contain alcohol and is unscented) to the skin on your feet and to dry, brittle toenails. Do not apply lotion between your toes.  Trim your toenails straight across. Do not dig under them or around the cuticle. File the edges of your nails with an emery board or nail file.  Do not cut corns or calluses or try to remove them with medicine.  Wear clean socks or stockings every day. Make sure they are not too tight. Do not wear knee-high stockings since they may decrease blood flow to your legs.  Wear shoes that fit properly and have enough cushioning. To break in new shoes, wear them for just a few hours a day. This prevents you from injuring your feet. Always look in your shoes before you put them on to be sure there are no objects  inside.  Do not cross your legs. This may decrease the blood flow to your feet.  If you find a minor scrape, cut, or break in the skin on your feet, keep it and the skin around it clean and dry. These areas may be cleansed with mild soap and water. Do not cleanse the area with peroxide, alcohol, or iodine.  When you remove an adhesive bandage, be sure not to damage the skin around it.  If you have a wound, look at it several times a day to make sure it is healing.  Do not use heating pads or hot water bottles. They may burn your skin. If you have lost feeling in your feet or legs, you may not know it is happening until it is too late.  Make sure your health care provider performs a complete foot exam at least annually or more often if you have foot problems. Report any cuts, sores, or bruises to your health care provider immediately. SEEK MEDICAL CARE IF:   You have an injury that is not healing.  You have cuts or breaks in the skin.  You have an ingrown nail.  You notice redness on your legs or feet.  You feel burning or tingling in your legs or feet.  You have pain or cramps in your legs and feet.  Your legs or feet  are numb.  Your feet always feel cold. SEEK IMMEDIATE MEDICAL CARE IF:   There is increasing redness, swelling, or pain in or around a wound.  There is a red line that goes up your leg.  Pus is coming from a wound.  You develop a fever or as directed by your health care provider.  You notice a bad smell coming from an ulcer or wound. Document Released: 11/26/2000 Document Revised: 08/01/2013 Document Reviewed: 05/08/2013 [facility] Patient Information 2015 [place], [place]. This information is not intended to replace advice given to you by your health care provider. Make sure you discuss any questions you have with your health care provider.

## 2015-03-03 NOTE — Patient Instructions (Signed)
Please follow up with Dr. Henrene Pastor a needed

## 2015-03-03 NOTE — Progress Notes (Signed)
Patient ID: [name], male   DOB: Oct 13, 1932, 81 y.o.   MRN: [id number]   Subjective:  this patient presents today requesting toenail debridement   Objective:  the toenails are elongated, hypertrophic, incurvated 6-10   Assessment:  onychomycoses 6-10   diabetic peripheral neuropathy associated with type 2 diabetes    Plan:  debridement of toenails 10 with  Pinpoint bleedin Distal right hallux. Betadine and Band-Aid applied to this area   patient was advised to continue to apply antibiotic ointment to this area and initial 2 days after removing Band-Aid.   reappoint 3 months

## 2015-03-03 NOTE — Progress Notes (Signed)
Patient ID: [name], male   DOB: [birth year], 81 y.o.   MRN: [id number]    HPI:  GRACE HAGGART is a 81 y.o.   male who is here today to discuss surveillance colonoscopy. Bands underwent a left colectomy in September 2011 due to colon cancer. He had a surveillance colonoscopy on January 9 of 2013 at which time no polyps or cancers were noted. He was advised to have a surveillance colonoscopy in 3 years. 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 He is currently on Neupogen.  He has been having increased symptoms with his Alzheimer's and is accompanied by his daughter today. He has not had any change in his bowel habits or stool caliber. He's had no bloody or tarry stools. He's had no anorexia or unexplained weight loss. The patient and his daughter are fairly adamant that they would prefer to not undergo endoscopic surveillance at this time. Patient states he has trouble with any form of sedation acid affects his memory for several days after each procedure.    Past Medical History  Diagnosis Date  . Aortic root dilatation   . DM2 (diabetes mellitus, type 2)   . Stroke   . HLD (hyperlipidemia)   . HTN (hypertension)   . CAD (coronary artery disease)     Presumed from previously mildy abnormal myoview;  myoview 4/12: EF 73%, no ischemia  . Leukopenia   . Pneumothorax   . Rectal cancer   . Cardiomyopathy     a. echo 5/12: EF 45-50%, mild LVH, grade 2 diast dysfxn, RAE  . Colon cancer   . Chronic kidney disease     renal insuff, increased uric acid  . Seizures   . Diabetes mellitus   . BPH (benign prostatic hyperplasia)     increased PSA  . Anemia     Past Surgical History  Procedure Laterality Date  . Hip arthroplasty    . Tonsillectomy    . S/p resection of rectal cancer      . Vasectomy    . Ileostomy  12/2010  . Reversal ileostomy  03/2011   Family History  Problem Relation Age of Onset  . Alzheimer's disease    . Parkinsonism Other   . Parkinsonism Father   . Stomach cancer Neg Hx   . Colon cancer Neg Hx    History  Substance Use Topics  . Smoking status: Never Smoker   . Smokeless tobacco: Never Used  . Alcohol Use: 2.4 oz/week    4 Cans of beer per week   Current Outpatient Prescriptions  Medication Sig Dispense Refill  . allopurinol (ZYLOPRIM) 100 MG tablet Take two (2) tablets by mouth twice a day. (Patient taking differently: Take 100 mg by mouth daily. Take one tablet by mouth twice a day.) 120 tablet 3  . amLODipine (NORVASC) 5 MG tablet Take 1 tablet (5 mg total) by mouth daily. 30 tablet 6  . aspirin 81 MG tablet Take 81 mg by mouth daily.      . B-D INS SYR MICROFINE 1CC/28G 28G X 1/2" 1 ML MISC USE AS DIRECTED 100 each 3  . cycloSPORINE modified (NEORAL) 25 MG capsule Take 125 mg by mouth 2 (two) times daily. 3 tabs twice daily =6 tabs total daily    . donepezil (ARICEPT) 5 MG tablet Take 5 mg by  mouth at bedtime as needed.    . doxazosin (CARDURA) 4 MG tablet TAKE 1 TABLET BY MOUTH AT BEDTIME 90 tablet 1  . filgrastim (NEUPOGEN) 300 MCG/ML injection Inject 1 mL (300 mcg total) into the skin daily. Take on Tuesdays & Fridays. 30 mL 1  . finasteride (PROSCAR) 5 MG tablet TAKE 1 TABLET BY MOUTH EVERY DAY 30 tablet 5  . furosemide (LASIX) 20 MG tablet TAKE 1 TABLET BY MOUTH EVERY DAY 90 tablet 3  . insulin regular (HUMULIN R) 100 units/mL injection Check 30-60 minutes prior to each meal. Give sliding-scale insulin as instructed 20 mL 11  . lamoTRIgine (LAMICTAL) 25 MG tablet Take 100 mg by mouth 3 (three) times daily.     . metoprolol succinate (TOPROL-XL) 25 MG 24 hr tablet TAKE 1 TABLET BY MOUTH EVERY DAY 30 tablet 2  . mirtazapine (REMERON) 15 MG tablet TAKE 1 TABLET AT BEDTIME 30 tablet 3  . mupirocin cream (BACTROBAN) 2 %   10  .  nitroGLYCERIN (NITROSTAT) 0.4 MG SL tablet Place 1 tablet (0.4 mg total) under the tongue every 5 (five) minutes as needed for chest pain. 25 tablet 3  . Nutritional Supplements (JUICE PLUS FIBRE PO) Take 2 tablets by mouth 2 (two) times daily.    . ONE TOUCH ULTRA TEST test strip CHECK BLOOD SUGAR 3 TIMES A DAY AS DIRECTED 100 each 8  . pravastatin (PRAVACHOL) 20 MG tablet Take 1 tablet (20 mg total) by mouth daily. 90 tablet 4   No current facility-administered medications for this visit.   No Known Allergies   Review of Systems: Gen: Denies any fever, chills, sweats, anorexia, fatigue, weakness, malaise, weight loss, and sleep disorder CV: Denies chest pain, angina, palpitations, syncope, orthopnea, PND, peripheral edema, and claudication. Resp: Denies dyspnea at rest, dyspnea with exercise, cough, sputum, wheezing, coughing up blood, and pleurisy. GI: Denies vomiting blood, jaundice, and fecal incontinence.   Denies dysphagia or odynophagia. GU : Denies urinary burning, blood in urine, urinary frequency, urinary hesitancy, nocturnal urination, and urinary incontinence. MS: Denies joint pain, limitation of movement, and swelling, stiffness, low back pain, extremity pain. Denies muscle weakness, cramps, atrophy.  Derm: Denies rash, itching, dry skin, hives, moles, warts, or unhealing ulcers.  Psych: Denies depression, anxiety, memory loss, suicidal ideation, hallucinations, paranoia, and confusion. Heme: Denies bruising, bleeding, and enlarged lymph nodes. Neuro:  Denies any headaches, dizziness, paresthesias. Endo:  Denies any problems with DM, thyroid, adrenal function    Prior Endoscopies:   The history of present illness.  Physical Exam: BP 130/72 mmHg  Pulse 72  Ht 6\' 2"  (1.88 m)  Wt 221 lb 6.4 oz (100.426 kg)  BMI 28.41 kg/m2 Constitutional: Pleasant,well-developed male in no acute distress. HEENT: Normocephalic and atraumatic. Conjunctivae are normal. No scleral  icterus. Neck supple.  Cardiovascular: Normal rate, regular rhythm.  Pulmonary/chest: Effort normal and breath sounds normal. No wheezing, rales or rhonchi. Abdominal: Soft, nondistended, nontender. Bowel sounds active throughout. There are no masses palpable. No hepatomegaly. Extremities: no edema Lymphadenopathy: No cervical adenopathy noted. Neurological: Alert and oriented to person place and time. Skin: Skin is warm and dry. No rashes noted. Psychiatric: Normal mood and affect. Behavior is normal.  ASSESSMENT AND PLAN: Asymptomatic 81 year old male status post left colectomy for colon cancer in 2011 here to discuss surveillance colonoscopy. Patient has multiple comorbidities and has expressed his desire to avoid any further procedures if he can. Patient has been reviewed with Dr. Henrene Pastor who has also been  in to speak with the patient and the patient's daughter, and the decision to forego surveillance at this time was made. Patient was told if he develops any symptoms, i.e. change in bowel habits, rectal bleeding, melena etc. he should call for evaluation.    Gianno Volner, Deloris Ping 03/03/2015, 11:31 AM  CC: [name], MD  GI ATTENDING  Patient seen and examined with physician assistant. Patient's wife in room. We discussed in details the pros and cons of surveillance colonoscopy at this point. Given the patient's age, comorbidities, and a normal postoperative exam 3 years ago, we have decided to forego surveillance at this point. The patient is asymptomatic. They are pleased with their decision.  Docia Chuck. Geri Seminole., M.D. [facility] Division of Gastroenterology

## 2015-03-04 DIAGNOSIS — R2689 Other abnormalities of gait and mobility: Secondary | ICD-10-CM | POA: Diagnosis not present

## 2015-03-06 DIAGNOSIS — R2689 Other abnormalities of gait and mobility: Secondary | ICD-10-CM | POA: Diagnosis not present

## 2015-03-11 ENCOUNTER — Telehealth: Payer: Self-pay

## 2015-03-11 DIAGNOSIS — R2689 Other abnormalities of gait and mobility: Secondary | ICD-10-CM | POA: Diagnosis not present

## 2015-03-11 NOTE — Telephone Encounter (Signed)
[name] states her father is coming by and sign a release for [place] to fax [facility] a copy of his last ov visit. The fax won't be on it So the fax number is [phone] and the phone number is [phone] attn: Osa Craver may call [name] at [phone] if needed

## 2015-03-12 DIAGNOSIS — E119 Type 2 diabetes mellitus without complications: Secondary | ICD-10-CM | POA: Diagnosis not present

## 2015-03-12 DIAGNOSIS — H25043 Posterior subcapsular polar age-related cataract, bilateral: Secondary | ICD-10-CM | POA: Diagnosis not present

## 2015-03-12 DIAGNOSIS — H2513 Age-related nuclear cataract, bilateral: Secondary | ICD-10-CM | POA: Diagnosis not present

## 2015-03-12 LAB — HM DIABETES EYE EXAM

## 2015-03-12 NOTE — Telephone Encounter (Signed)
Fax number was on [employer] form. Records faxed.

## 2015-03-13 DIAGNOSIS — R2689 Other abnormalities of gait and mobility: Secondary | ICD-10-CM | POA: Diagnosis not present

## 2015-03-17 ENCOUNTER — Other Ambulatory Visit: Payer: Self-pay | Admitting: *Deleted

## 2015-03-17 DIAGNOSIS — Z85048 Personal history of other malignant neoplasm of rectum, rectosigmoid junction, and anus: Secondary | ICD-10-CM

## 2015-03-17 DIAGNOSIS — D72819 Decreased white blood cell count, unspecified: Secondary | ICD-10-CM

## 2015-03-17 DIAGNOSIS — C187 Malignant neoplasm of sigmoid colon: Secondary | ICD-10-CM

## 2015-03-17 MED ORDER — CYCLOSPORINE MODIFIED (NEORAL) 25 MG PO CAPS: 125.0000 mg | ORAL_CAPSULE | Freq: Two times a day (BID) | ORAL | Status: DC

## 2015-03-18 ENCOUNTER — Other Ambulatory Visit: Payer: Self-pay | Admitting: Physician Assistant

## 2015-03-18 DIAGNOSIS — D485 Neoplasm of uncertain behavior of skin: Secondary | ICD-10-CM | POA: Diagnosis not present

## 2015-03-18 DIAGNOSIS — L83 Acanthosis nigricans: Secondary | ICD-10-CM | POA: Diagnosis not present

## 2015-03-18 DIAGNOSIS — C44310 Basal cell carcinoma of skin of unspecified parts of face: Secondary | ICD-10-CM | POA: Diagnosis not present

## 2015-03-18 DIAGNOSIS — R2689 Other abnormalities of gait and mobility: Secondary | ICD-10-CM | POA: Diagnosis not present

## 2015-03-18 DIAGNOSIS — L570 Actinic keratosis: Secondary | ICD-10-CM | POA: Diagnosis not present

## 2015-03-18 DIAGNOSIS — C44719 Basal cell carcinoma of skin of left lower limb, including hip: Secondary | ICD-10-CM | POA: Diagnosis not present

## 2015-03-18 DIAGNOSIS — C44311 Basal cell carcinoma of skin of nose: Secondary | ICD-10-CM | POA: Diagnosis not present

## 2015-03-18 DIAGNOSIS — C44711 Basal cell carcinoma of skin of unspecified lower limb, including hip: Secondary | ICD-10-CM | POA: Diagnosis not present

## 2015-03-19 ENCOUNTER — Telehealth: Payer: Self-pay | Admitting: Hematology

## 2015-03-19 ENCOUNTER — Encounter: Payer: Self-pay | Admitting: Hematology

## 2015-03-19 ENCOUNTER — Other Ambulatory Visit (HOSPITAL_BASED_OUTPATIENT_CLINIC_OR_DEPARTMENT_OTHER): Payer: Medicare Other

## 2015-03-19 ENCOUNTER — Ambulatory Visit (HOSPITAL_BASED_OUTPATIENT_CLINIC_OR_DEPARTMENT_OTHER): Payer: Medicare Other | Admitting: Hematology

## 2015-03-19 VITALS — BP 156/54 | HR 72 | Temp 97.8°F | Resp 18 | Ht 74.0 in | Wt 221.7 lb

## 2015-03-19 DIAGNOSIS — D708 Other neutropenia: Secondary | ICD-10-CM

## 2015-03-19 DIAGNOSIS — C187 Malignant neoplasm of sigmoid colon: Secondary | ICD-10-CM

## 2015-03-19 DIAGNOSIS — D631 Anemia in chronic kidney disease: Secondary | ICD-10-CM | POA: Diagnosis not present

## 2015-03-19 DIAGNOSIS — Z862 Personal history of diseases of the blood and blood-forming organs and certain disorders involving the immune mechanism: Secondary | ICD-10-CM

## 2015-03-19 DIAGNOSIS — N189 Chronic kidney disease, unspecified: Secondary | ICD-10-CM | POA: Diagnosis not present

## 2015-03-19 DIAGNOSIS — D709 Neutropenia, unspecified: Secondary | ICD-10-CM

## 2015-03-19 DIAGNOSIS — D72819 Decreased white blood cell count, unspecified: Secondary | ICD-10-CM

## 2015-03-19 LAB — CBC WITH DIFFERENTIAL/PLATELET
BASO%: 0.4 % (ref 0.0–2.0)
Basophils Absolute: 0.1 10e3/uL (ref 0.0–0.1)
EOS%: 1.1 % (ref 0.0–7.0)
Eosinophils Absolute: 0.2 10e3/uL (ref 0.0–0.5)
HCT: 33 % — ABNORMAL LOW (ref 38.4–49.9)
HGB: 10.4 g/dL — ABNORMAL LOW (ref 13.0–17.1)
LYMPH%: 13.3 % — ABNORMAL LOW (ref 14.0–49.0)
MCH: 27.8 pg (ref 27.2–33.4)
MCHC: 31.5 g/dL — ABNORMAL LOW (ref 32.0–36.0)
MCV: 88.3 fL (ref 79.3–98.0)
MONO#: 0.5 10e3/uL (ref 0.1–0.9)
MONO%: 2.3 % (ref 0.0–14.0)
NEUT#: 17.7 10e3/uL — ABNORMAL HIGH (ref 1.5–6.5)
NEUT%: 82.9 % — ABNORMAL HIGH (ref 39.0–75.0)
Platelets: 233 10e3/uL (ref 140–400)
RBC: 3.74 10e6/uL — ABNORMAL LOW (ref 4.20–5.82)
RDW: 16.2 % — ABNORMAL HIGH (ref 11.0–14.6)
WBC: 21.3 10e3/uL — ABNORMAL HIGH (ref 4.0–10.3)
lymph#: 2.8 10e3/uL (ref 0.9–3.3)

## 2015-03-19 NOTE — Telephone Encounter (Signed)
gave and printed appt sched and avs for pt for April thru June

## 2015-03-19 NOTE — Progress Notes (Signed)
[facility] OFFICE PROGRESS NOTE  [name], MD [place] [address]  DIAGNOSIS: Neutropenia   CURRENT THERAPY:  1. Neupogen 300 mcg subcu,  started in late December 2011, he was on 3-4 times weekly initially, and reduced to once weekly (Tuesday) since 12/13/2014, stopped on 03/19/2015 2. Cyclosporine 125 mg twice daily. Cyclosporine was started in August 2012.  INTERVAL HISTORY: [name] 81 y.o. male with a history of autoimmune neutropenia is here for follow-up.  He is doing well overall, no fever or episode of infection since last visit. He has been on Neupogen injection once a weekly for the last month, last dose was 5 days ago. He was seen by his dermatologist yesterday and had many precancerous skin lesion removed, he complains pain from the procedure. No bleeding.   MEDICAL HISTORY: Past Medical History  Diagnosis Date  . Aortic root dilatation   . DM2 (diabetes mellitus, type 2)   . Stroke   . HLD (hyperlipidemia)   . HTN (hypertension)   . CAD (coronary artery disease)     Presumed from previously mildy abnormal myoview;  myoview 4/12: EF 73%, no ischemia  . Leukopenia   . Pneumothorax   . Rectal cancer   . Cardiomyopathy     a. echo 5/12: EF 45-50%, mild LVH, grade 2 diast dysfxn, RAE  . Colon cancer   . Chronic kidney disease     renal insuff, increased uric acid  . Seizures   . Diabetes mellitus   . BPH (benign prostatic hyperplasia)     increased PSA  . Anemia      ALLERGIES:  has No Known Allergies.  MEDICATIONS: has a current medication list which includes the following prescription(s): allopurinol, amlodipine, aspirin, b-d ins syr microfine 1cc/28g, cyclosporine modified, donepezil, doxazosin, filgrastim, finasteride, furosemide, insulin regular, lamotrigine, metoprolol succinate, mirtazapine, mupirocin cream, nitroglycerin, nutritional supplements, one touch ultra test, and pravastatin.  SURGICAL HISTORY:  Past  Surgical History  Procedure Laterality Date  . Hip arthroplasty    . Tonsillectomy    . S/p resection of rectal cancer    . Vasectomy    . Ileostomy  12/2010  . Reversal ileostomy  03/2011    REVIEW OF SYSTEMS:   Constitutional: Denies fevers, chills or abnormal weight loss Eyes: Denies blurriness of vision Ears, nose, mouth, throat, and face: Denies mucositis or sore throat Respiratory: Denies cough, dyspnea or wheezes Cardiovascular: Denies palpitation, chest discomfort or lower extremity swelling Gastrointestinal:  Denies nausea, heartburn or change in bowel habits Skin: Denies abnormal skin rashes Lymphatics: Denies new lymphadenopathy or easy bruising Neurological:Denies numbness, tingling or new weaknesses Behavioral/Psych: Mood is stable, no new changes  All other systems were reviewed with the patient and are negative.  PHYSICAL EXAMINATION: ECOG PERFORMANCE STATUS: 1  Blood pressure 156/54, pulse 72, temperature 97.8 F (36.6 C), temperature source Oral, resp. rate 18, height 6\' 2"  (1.88 m), weight 221 lb 11.2 oz (100.562 kg), SpO2 97 %.  GENERAL:alert, no distress and comfortable; chronically ill appearing.  SKIN: skin color, texture, turgor are normal, no rashes;  Chronic scaling of skin.  EYES: normal, Conjunctiva are pink and non-injected, sclera clear OROPHARYNX:no exudate, no erythema and lips, buccal mucosa, and tongue normal  NECK: supple, thyroid normal size, non-tender, without nodularity LYMPH:  no palpable lymphadenopathy in the cervical, axillary or supraclavicular LUNGS: clear to auscultation and percussion with normal breathing effort HEART: regular rate & rhythm and no murmurs and trace lower  extremity edema bilaterally. ABDOMEN:abdomen soft, non-tender and normal bowel sounds Musculoskeletal:no cyanosis of digits and no clubbing; R arm with decreased ROM.  NEURO: alert & oriented x 3 with fluent speech, no focal motor/sensory deficits  LABORATORY  DATA: CBC Latest Ref Rng 03/19/2015 02/12/2015 01/14/2015  WBC 4.0 - 10.3 10e3/uL 21.3(H) 22.3(H) 7.2  Hemoglobin 13.0 - 17.1 g/dL 10.4(L) 10.1(L) 10.7(L)  Hematocrit 38.4 - 49.9 % 33.0(L) 32.4(L) 34.3(L)  Platelets 140 - 400 10e3/uL 233 248 223    CMP Latest Ref Rng 11/22/2014 09/20/2014 08/09/2014  Glucose 70 - 140 mg/dl 164(H) 207(H) 135  BUN 7.0 - 26.0 mg/dL 22.4 27.1(H) 24.3  Creatinine 0.7 - 1.3 mg/dL 1.9(H) 2.1(H) 1.8(H)  Sodium 136 - 145 mEq/L 141 139 138  Potassium 3.5 - 5.1 mEq/L 4.9 4.5 5.0  Chloride 96 - 112 mEq/L - - -  CO2 22 - 29 mEq/L 26 28 28   Calcium 8.4 - 10.4 mg/dL 9.2 9.1 9.2  Total Protein 6.4 - 8.3 g/dL - 7.0 7.2  Total Bilirubin 0.20 - 1.20 mg/dL - 0.58 0.68  Alkaline Phos 40 - 150 U/L - 126 134  AST 5 - 34 U/L - 13 11  ALT 0 - 55 U/L - 8 9     RADIOGRAPHIC STUDIES: No results found.  ASSESSMENT: [name] 81 y.o. male with a history of  1. Autoimmune neutropenia. - He continues to do well overall.  His current dose of neupogen is 300 mcg SQ weekly (Tuesday). His WBC has been around 20 K since last months. -I recommend to stop Neupogen, and a watch his blood counts closely. ---We will continue cyclosporine at the same dose 125 mg twice a day for now, possible take it off if he dose well without neupogen    2. 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 Verl Blalock 12/22/2011 without significant finding. Last CEA level is 2.7 on 02/12/2014.  -continue follow up, wilL check CEA next time   4. Anemia.  - Likely related to mild renal insufficiency. Stable -We discussed the role of Aranesp for anemia of chronic disease related to CKD. He is asymptomatic, will hold on for now.   5. Follow-up.  -CBC in 2 and 4 weeks, if stable, then monthly, I will see him back in 2 months.   All questions were answered. The patient knows to call the clinic with any problems, questions or concerns. We can certainly see the patient  much sooner if necessary.  I spent 15 minutes counseling the patient face to face. The total time spent in the appointment was 25 minutes.    Truitt Merle, MD 03/19/2015  11:08 AMy

## 2015-03-21 ENCOUNTER — Other Ambulatory Visit: Payer: Self-pay | Admitting: Physician Assistant

## 2015-03-25 DIAGNOSIS — R2689 Other abnormalities of gait and mobility: Secondary | ICD-10-CM | POA: Diagnosis not present

## 2015-03-27 DIAGNOSIS — R2689 Other abnormalities of gait and mobility: Secondary | ICD-10-CM | POA: Diagnosis not present

## 2015-03-28 ENCOUNTER — Telehealth: Payer: Self-pay

## 2015-03-28 DIAGNOSIS — F0390 Unspecified dementia without behavioral disturbance: Secondary | ICD-10-CM | POA: Insufficient documentation

## 2015-03-28 NOTE — Telephone Encounter (Signed)
Form completed and mailed. LMOM for pt's daughter advising form is being mailed and made copy of form to be scanned.

## 2015-03-28 NOTE — Telephone Encounter (Signed)
The patient's daughter brought in form to be completed by Dr. Everlene Farrier and mailed to insurance company in attached stamped envelope.  The patient/patient's daughter may be reached at [phone] to notify them that the form has been mailed to [place].  The form was placed in the nurse's box at [address].

## 2015-04-01 DIAGNOSIS — R2689 Other abnormalities of gait and mobility: Secondary | ICD-10-CM | POA: Diagnosis not present

## 2015-04-02 ENCOUNTER — Other Ambulatory Visit: Payer: Self-pay | Admitting: Hematology

## 2015-04-02 ENCOUNTER — Other Ambulatory Visit (HOSPITAL_BASED_OUTPATIENT_CLINIC_OR_DEPARTMENT_OTHER): Payer: Medicare Other

## 2015-04-02 ENCOUNTER — Telehealth: Payer: Self-pay | Admitting: *Deleted

## 2015-04-02 ENCOUNTER — Encounter: Payer: Self-pay | Admitting: Emergency Medicine

## 2015-04-02 DIAGNOSIS — C187 Malignant neoplasm of sigmoid colon: Secondary | ICD-10-CM | POA: Diagnosis not present

## 2015-04-02 DIAGNOSIS — D72819 Decreased white blood cell count, unspecified: Secondary | ICD-10-CM

## 2015-04-02 DIAGNOSIS — D708 Other neutropenia: Secondary | ICD-10-CM

## 2015-04-02 DIAGNOSIS — Z862 Personal history of diseases of the blood and blood-forming organs and certain disorders involving the immune mechanism: Secondary | ICD-10-CM

## 2015-04-02 DIAGNOSIS — D709 Neutropenia, unspecified: Secondary | ICD-10-CM

## 2015-04-02 LAB — CBC WITH DIFFERENTIAL/PLATELET
BASO%: 1.1 % (ref 0.0–2.0)
Basophils Absolute: 0.1 10*3/uL (ref 0.0–0.1)
EOS%: 1.5 % (ref 0.0–7.0)
Eosinophils Absolute: 0.1 10*3/uL (ref 0.0–0.5)
HCT: 32.1 % — ABNORMAL LOW (ref 38.4–49.9)
HGB: 10.5 g/dL — ABNORMAL LOW (ref 13.0–17.1)
LYMPH%: 32.4 % (ref 14.0–49.0)
MCH: 28.7 pg (ref 27.2–33.4)
MCHC: 32.5 g/dL (ref 32.0–36.0)
MCV: 88.3 fL (ref 79.3–98.0)
MONO#: 0.3 10*3/uL (ref 0.1–0.9)
MONO%: 4.4 % (ref 0.0–14.0)
NEUT%: 60.6 % (ref 39.0–75.0)
NEUTROS ABS: 4.6 10*3/uL (ref 1.5–6.5)
Platelets: 235 10*3/uL (ref 140–400)
RBC: 3.64 10*6/uL — ABNORMAL LOW (ref 4.20–5.82)
RDW: 15.8 % — AB (ref 11.0–14.6)
WBC: 7.6 10*3/uL (ref 4.0–10.3)
lymph#: 2.5 10*3/uL (ref 0.9–3.3)

## 2015-04-02 LAB — COMPREHENSIVE METABOLIC PANEL (CC13)
ALK PHOS: 115 U/L (ref 40–150)
ALT: 11 U/L (ref 0–55)
AST: 13 U/L (ref 5–34)
Albumin: 3.9 g/dL (ref 3.5–5.0)
Anion Gap: 11 mEq/L (ref 3–11)
BUN: 27 mg/dL — ABNORMAL HIGH (ref 7.0–26.0)
CO2: 24 mEq/L (ref 22–29)
Calcium: 8.6 mg/dL (ref 8.4–10.4)
Chloride: 104 mEq/L (ref 98–109)
Creatinine: 1.9 mg/dL — ABNORMAL HIGH (ref 0.7–1.3)
EGFR: 33 mL/min/{1.73_m2} — ABNORMAL LOW (ref >90)
GLUCOSE: 192 mg/dL — AB (ref 70–140)
POTASSIUM: 5.3 meq/L — AB (ref 3.5–5.1)
SODIUM: 139 meq/L (ref 136–145)
Total Bilirubin: 0.43 mg/dL (ref 0.20–1.20)
Total Protein: 6.7 g/dL (ref 6.4–8.3)

## 2015-04-02 NOTE — Telephone Encounter (Signed)
Received vm call from [name]/daughter stating that pt's WBC was down & wondering if he should cont to stay off neupogen.  Discussed with Dr Burr Medico & informed daughter that he should stay off neupogen & repeat labs in a couple of weeks.  Order placed by Dr Burr Medico & scheduler should call them.

## 2015-04-03 ENCOUNTER — Telehealth: Payer: Self-pay | Admitting: Hematology

## 2015-04-03 DIAGNOSIS — R2689 Other abnormalities of gait and mobility: Secondary | ICD-10-CM | POA: Diagnosis not present

## 2015-04-03 LAB — CEA: CEA: 2.6 ng/mL (ref 0.0–5.0)

## 2015-04-03 NOTE — Telephone Encounter (Signed)
Left message to confirm lab appointment for 04/28

## 2015-04-08 DIAGNOSIS — R2689 Other abnormalities of gait and mobility: Secondary | ICD-10-CM | POA: Diagnosis not present

## 2015-04-09 ENCOUNTER — Telehealth: Payer: Self-pay | Admitting: Hematology

## 2015-04-09 ENCOUNTER — Other Ambulatory Visit (HOSPITAL_BASED_OUTPATIENT_CLINIC_OR_DEPARTMENT_OTHER): Payer: Medicare Other

## 2015-04-09 DIAGNOSIS — C187 Malignant neoplasm of sigmoid colon: Secondary | ICD-10-CM | POA: Diagnosis not present

## 2015-04-09 DIAGNOSIS — D709 Neutropenia, unspecified: Secondary | ICD-10-CM

## 2015-04-09 DIAGNOSIS — Z862 Personal history of diseases of the blood and blood-forming organs and certain disorders involving the immune mechanism: Secondary | ICD-10-CM

## 2015-04-09 DIAGNOSIS — D72819 Decreased white blood cell count, unspecified: Secondary | ICD-10-CM

## 2015-04-09 DIAGNOSIS — D708 Other neutropenia: Secondary | ICD-10-CM

## 2015-04-09 LAB — CBC WITH DIFFERENTIAL/PLATELET
BASO%: 1 % (ref 0.0–2.0)
Basophils Absolute: 0.1 10*3/uL (ref 0.0–0.1)
EOS ABS: 0.2 10*3/uL (ref 0.0–0.5)
EOS%: 2.1 % (ref 0.0–7.0)
HEMATOCRIT: 32.8 % — AB (ref 38.4–49.9)
HGB: 10.4 g/dL — ABNORMAL LOW (ref 13.0–17.1)
LYMPH%: 37.3 % (ref 14.0–49.0)
MCH: 28.1 pg (ref 27.2–33.4)
MCHC: 31.6 g/dL — AB (ref 32.0–36.0)
MCV: 88.9 fL (ref 79.3–98.0)
MONO#: 0.4 10*3/uL (ref 0.1–0.9)
MONO%: 6 % (ref 0.0–14.0)
NEUT#: 3.9 10*3/uL (ref 1.5–6.5)
NEUT%: 53.6 % (ref 39.0–75.0)
PLATELETS: 234 10*3/uL (ref 140–400)
RBC: 3.69 10*6/uL — AB (ref 4.20–5.82)
RDW: 15.4 % — ABNORMAL HIGH (ref 11.0–14.6)
WBC: 7.3 10*3/uL (ref 4.0–10.3)
lymph#: 2.7 10*3/uL (ref 0.9–3.3)

## 2015-04-09 NOTE — Telephone Encounter (Signed)
Gave avs & calendar for May. °

## 2015-04-10 ENCOUNTER — Other Ambulatory Visit: Payer: Medicare Other

## 2015-04-15 DIAGNOSIS — M25512 Pain in left shoulder: Secondary | ICD-10-CM | POA: Diagnosis not present

## 2015-04-15 DIAGNOSIS — M6281 Muscle weakness (generalized): Secondary | ICD-10-CM | POA: Diagnosis not present

## 2015-04-16 ENCOUNTER — Other Ambulatory Visit (HOSPITAL_BASED_OUTPATIENT_CLINIC_OR_DEPARTMENT_OTHER): Payer: Medicare Other

## 2015-04-16 DIAGNOSIS — C187 Malignant neoplasm of sigmoid colon: Secondary | ICD-10-CM | POA: Diagnosis not present

## 2015-04-16 DIAGNOSIS — D72819 Decreased white blood cell count, unspecified: Secondary | ICD-10-CM

## 2015-04-16 DIAGNOSIS — D709 Neutropenia, unspecified: Secondary | ICD-10-CM

## 2015-04-16 DIAGNOSIS — D708 Other neutropenia: Secondary | ICD-10-CM

## 2015-04-16 DIAGNOSIS — Z862 Personal history of diseases of the blood and blood-forming organs and certain disorders involving the immune mechanism: Secondary | ICD-10-CM

## 2015-04-16 LAB — CBC WITH DIFFERENTIAL/PLATELET
BASO%: 0.6 % (ref 0.0–2.0)
Basophils Absolute: 0 10*3/uL (ref 0.0–0.1)
EOS%: 1.4 % (ref 0.0–7.0)
Eosinophils Absolute: 0.1 10*3/uL (ref 0.0–0.5)
HCT: 32.6 % — ABNORMAL LOW (ref 38.4–49.9)
HGB: 10.6 g/dL — ABNORMAL LOW (ref 13.0–17.1)
LYMPH%: 32.2 % (ref 14.0–49.0)
MCH: 29 pg (ref 27.2–33.4)
MCHC: 32.5 g/dL (ref 32.0–36.0)
MCV: 89.1 fL (ref 79.3–98.0)
MONO#: 0.3 10*3/uL (ref 0.1–0.9)
MONO%: 4.4 % (ref 0.0–14.0)
NEUT%: 61.4 % (ref 39.0–75.0)
NEUTROS ABS: 4.3 10*3/uL (ref 1.5–6.5)
Platelets: 213 10*3/uL (ref 140–400)
RBC: 3.66 10*6/uL — ABNORMAL LOW (ref 4.20–5.82)
RDW: 14.2 % (ref 11.0–14.6)
WBC: 7 10*3/uL (ref 4.0–10.3)
lymph#: 2.3 10*3/uL (ref 0.9–3.3)

## 2015-04-17 DIAGNOSIS — C4442 Squamous cell carcinoma of skin of scalp and neck: Secondary | ICD-10-CM | POA: Diagnosis not present

## 2015-04-17 DIAGNOSIS — D0461 Carcinoma in situ of skin of right upper limb, including shoulder: Secondary | ICD-10-CM | POA: Diagnosis not present

## 2015-04-17 DIAGNOSIS — D485 Neoplasm of uncertain behavior of skin: Secondary | ICD-10-CM | POA: Diagnosis not present

## 2015-04-17 DIAGNOSIS — M25511 Pain in right shoulder: Secondary | ICD-10-CM | POA: Diagnosis not present

## 2015-04-17 DIAGNOSIS — R2681 Unsteadiness on feet: Secondary | ICD-10-CM | POA: Diagnosis not present

## 2015-04-17 DIAGNOSIS — L570 Actinic keratosis: Secondary | ICD-10-CM | POA: Diagnosis not present

## 2015-04-17 DIAGNOSIS — D0460 Carcinoma in situ of skin of unspecified upper limb, including shoulder: Secondary | ICD-10-CM | POA: Diagnosis not present

## 2015-04-22 DIAGNOSIS — M6281 Muscle weakness (generalized): Secondary | ICD-10-CM | POA: Diagnosis not present

## 2015-04-22 DIAGNOSIS — M25511 Pain in right shoulder: Secondary | ICD-10-CM | POA: Diagnosis not present

## 2015-04-24 DIAGNOSIS — M25511 Pain in right shoulder: Secondary | ICD-10-CM | POA: Diagnosis not present

## 2015-04-24 DIAGNOSIS — M6281 Muscle weakness (generalized): Secondary | ICD-10-CM | POA: Diagnosis not present

## 2015-04-29 ENCOUNTER — Ambulatory Visit: Payer: Medicare Other | Admitting: Emergency Medicine

## 2015-04-29 DIAGNOSIS — M6281 Muscle weakness (generalized): Secondary | ICD-10-CM | POA: Diagnosis not present

## 2015-05-01 DIAGNOSIS — M25512 Pain in left shoulder: Secondary | ICD-10-CM | POA: Diagnosis not present

## 2015-05-01 DIAGNOSIS — M6281 Muscle weakness (generalized): Secondary | ICD-10-CM | POA: Diagnosis not present

## 2015-05-03 ENCOUNTER — Other Ambulatory Visit: Payer: Self-pay | Admitting: Hematology

## 2015-05-06 DIAGNOSIS — M6281 Muscle weakness (generalized): Secondary | ICD-10-CM | POA: Diagnosis not present

## 2015-05-06 DIAGNOSIS — M25512 Pain in left shoulder: Secondary | ICD-10-CM | POA: Diagnosis not present

## 2015-05-08 DIAGNOSIS — M25512 Pain in left shoulder: Secondary | ICD-10-CM | POA: Diagnosis not present

## 2015-05-08 DIAGNOSIS — M6281 Muscle weakness (generalized): Secondary | ICD-10-CM | POA: Diagnosis not present

## 2015-05-09 ENCOUNTER — Other Ambulatory Visit: Payer: Self-pay | Admitting: Cardiology

## 2015-05-13 DIAGNOSIS — M6281 Muscle weakness (generalized): Secondary | ICD-10-CM | POA: Diagnosis not present

## 2015-05-13 DIAGNOSIS — M25512 Pain in left shoulder: Secondary | ICD-10-CM | POA: Diagnosis not present

## 2015-05-14 ENCOUNTER — Encounter: Payer: Self-pay | Admitting: Hematology

## 2015-05-14 ENCOUNTER — Telehealth: Payer: Self-pay | Admitting: Hematology

## 2015-05-14 ENCOUNTER — Ambulatory Visit (HOSPITAL_BASED_OUTPATIENT_CLINIC_OR_DEPARTMENT_OTHER): Payer: Medicare Other | Admitting: Hematology

## 2015-05-14 ENCOUNTER — Other Ambulatory Visit (HOSPITAL_BASED_OUTPATIENT_CLINIC_OR_DEPARTMENT_OTHER): Payer: Medicare Other

## 2015-05-14 VITALS — BP 162/64 | HR 65 | Temp 97.8°F | Resp 18 | Ht 74.0 in | Wt 221.4 lb

## 2015-05-14 DIAGNOSIS — D709 Neutropenia, unspecified: Secondary | ICD-10-CM

## 2015-05-14 DIAGNOSIS — D631 Anemia in chronic kidney disease: Secondary | ICD-10-CM

## 2015-05-14 DIAGNOSIS — D72819 Decreased white blood cell count, unspecified: Secondary | ICD-10-CM

## 2015-05-14 DIAGNOSIS — D708 Other neutropenia: Secondary | ICD-10-CM | POA: Diagnosis not present

## 2015-05-14 DIAGNOSIS — C187 Malignant neoplasm of sigmoid colon: Secondary | ICD-10-CM

## 2015-05-14 DIAGNOSIS — N183 Chronic kidney disease, stage 3 (moderate): Secondary | ICD-10-CM | POA: Diagnosis not present

## 2015-05-14 DIAGNOSIS — D638 Anemia in other chronic diseases classified elsewhere: Secondary | ICD-10-CM

## 2015-05-14 DIAGNOSIS — Z862 Personal history of diseases of the blood and blood-forming organs and certain disorders involving the immune mechanism: Secondary | ICD-10-CM

## 2015-05-14 LAB — CBC WITH DIFFERENTIAL/PLATELET
BASO%: 0.4 % (ref 0.0–2.0)
Basophils Absolute: 0 10*3/uL (ref 0.0–0.1)
EOS ABS: 0.1 10*3/uL (ref 0.0–0.5)
EOS%: 1.7 % (ref 0.0–7.0)
HCT: 34.2 % — ABNORMAL LOW (ref 38.4–49.9)
HGB: 11.1 g/dL — ABNORMAL LOW (ref 13.0–17.1)
LYMPH%: 38.8 % (ref 14.0–49.0)
MCH: 28.5 pg (ref 27.2–33.4)
MCHC: 32.5 g/dL (ref 32.0–36.0)
MCV: 87.9 fL (ref 79.3–98.0)
MONO#: 0.5 10*3/uL (ref 0.1–0.9)
MONO%: 6.4 % (ref 0.0–14.0)
NEUT#: 3.7 10*3/uL (ref 1.5–6.5)
NEUT%: 52.7 % (ref 39.0–75.0)
Platelets: 208 10*3/uL (ref 140–400)
RBC: 3.89 10*6/uL — AB (ref 4.20–5.82)
RDW: 13.9 % (ref 11.0–14.6)
WBC: 7 10*3/uL (ref 4.0–10.3)
lymph#: 2.7 10*3/uL (ref 0.9–3.3)

## 2015-05-14 NOTE — Progress Notes (Signed)
[place] OFFICE PROGRESS NOTE  [name] Reichmann, [name] [id number]  DIAGNOSIS: Neutropenia   CURRENT THERAPY:  1. Neupogen 300 mcg subcu,  started in late December 2011, he was on 3-4 times weekly initially, and reduced to once weekly (Tuesday) since 12/13/2014, stopped on 03/19/2015 2. Cyclosporine 125 mg twice daily. Cyclosporine was started in August 2012, and stopped on 05/14/2015   INTERVAL HISTORY: [name] 81 y.o. male with a history of autoimmune neutropenia is here for follow-up.  He is doing well overall, no fever or episode of infection since last visit. He has been off Neupogen injection since early April, and he is WBC and ANC has been normal since then.   His daughter reports that his blood sugar has been getting worse since the tapering off the Neupogen 4 months ago. He is on insulin sliding scale, his blood sugars being in the range of 200s, he is followed by his primary care physician on that.   He otherwise feels well overall, no complaints of pain, nausea, cough, dyspnea or other new symptoms. He lives with his daughter.   MEDICAL HISTORY: Past Medical History  Diagnosis Date  . Aortic root dilatation   . DM2 (diabetes mellitus, type 2)   . Stroke   . HLD (hyperlipidemia)   . HTN (hypertension)   . CAD (coronary artery disease)     Presumed from previously mildy abnormal myoview;  myoview 4/12: EF 73%, no ischemia  . Leukopenia   . Pneumothorax   . Rectal cancer   . Cardiomyopathy     a. echo 5/12: EF 45-50%, mild LVH, grade 2 diast dysfxn, RAE  . Colon cancer   . Chronic kidney disease     renal insuff, increased uric acid  . Seizures   . Diabetes mellitus   . BPH (benign prostatic hyperplasia)     increased PSA  . Anemia      ALLERGIES:  has No Known Allergies.  MEDICATIONS: has a current medication list which includes the following prescription(s): allopurinol, amlodipine, aspirin, b-d ins syr microfine  1cc/28g, cyclosporine modified, donepezil, doxazosin, filgrastim, finasteride, furosemide, insulin regular, lamotrigine, metoprolol succinate, mirtazapine, mupirocin cream, nitroglycerin, nutritional supplements, one touch ultra test, and pravastatin.  SURGICAL HISTORY:  Past Surgical History  Procedure Laterality Date  . Hip arthroplasty    . Tonsillectomy    . S/p resection of rectal cancer    . Vasectomy    . Ileostomy  12/2010  . Reversal ileostomy  03/2011    REVIEW OF SYSTEMS:   Constitutional: Denies fevers, chills or abnormal weight loss Eyes: Denies blurriness of vision Ears, nose, mouth, throat, and face: Denies mucositis or sore throat Respiratory: Denies cough, dyspnea or wheezes Cardiovascular: Denies palpitation, chest discomfort or lower extremity swelling Gastrointestinal:  Denies nausea, heartburn or change in bowel habits Skin: Denies abnormal skin rashes Lymphatics: Denies new lymphadenopathy or easy bruising Neurological:Denies numbness, tingling or new weaknesses Behavioral/Psych: Mood is stable, no new changes  All other systems were reviewed with the patient and are negative.  PHYSICAL EXAMINATION: ECOG PERFORMANCE STATUS: 1  Blood pressure 162/64, pulse 65, temperature 97.8 F (36.6 C), temperature source Oral, resp. rate 18, height 6\' 2"  (1.88 m), weight 221 lb 6.4 oz (100.426 kg), SpO2 97 %.  GENERAL:alert, no distress and comfortable; chronically ill appearing.  SKIN: skin color, texture, turgor are normal, no rashes;  Chronic scaling of skin.  EYES: normal, Conjunctiva are pink and non-injected,  sclera clear OROPHARYNX:no exudate, no erythema and lips, buccal mucosa, and tongue normal  NECK: supple, thyroid normal size, non-tender, without nodularity LYMPH:  no palpable lymphadenopathy in the cervical, axillary or supraclavicular LUNGS: clear to auscultation and percussion with normal breathing effort HEART: regular rate & rhythm and no murmurs and  trace lower extremity edema bilaterally. ABDOMEN:abdomen soft, non-tender and normal bowel sounds Musculoskeletal:no cyanosis of digits and no clubbing; R arm with decreased ROM.  NEURO: alert & oriented x 3 with fluent speech, no focal motor/sensory deficits  LABORATORY DATA: CBC Latest Ref Rng 05/14/2015 04/16/2015 04/09/2015  WBC 4.0 - 10.3 10e3/uL 7.0 7.0 7.3  Hemoglobin 13.0 - 17.1 g/dL 11.1(L) 10.6(L) 10.4(L)  Hematocrit 38.4 - 49.9 % 34.2(L) 32.6(L) 32.8(L)  Platelets 140 - 400 10e3/uL 208 213 234    CMP Latest Ref Rng 04/02/2015 11/22/2014 09/20/2014  Glucose 70 - 140 mg/dl 192(H) 164(H) 207(H)  BUN 7.0 - 26.0 mg/dL 27.0(H) 22.4 27.1(H)  Creatinine 0.7 - 1.3 mg/dL 1.9(H) 1.9(H) 2.1(H)  Sodium 136 - 145 mEq/L 139 141 139  Potassium 3.5 - 5.1 mEq/L 5.3(H) 4.9 4.5  Chloride 96 - 112 mEq/L - - -  CO2 22 - 29 mEq/L 24 26 28   Calcium 8.4 - 10.4 mg/dL 8.6 9.2 9.1  Total Protein 6.4 - 8.3 g/dL 6.7 - 7.0  Total Bilirubin 0.20 - 1.20 mg/dL 0.43 - 0.58  Alkaline Phos 40 - 150 U/L 115 - 126  AST 5 - 34 U/L 13 - 13  ALT 0 - 55 U/L 11 - 8   CEA  Status: Finalresult Visible to patient:  Not Released Nextappt: 05/20/2015 at 02:45 PM in Family Medicine (DAUB, STEVE A, MD) Dx:  ADENOCARCINOMA, SIGMOID COLON           Ref Range 104mo ago  84mo ago  18mo ago     CEA 0.0 - 5.0 ng/mL 2.6 2.0 2.9           RADIOGRAPHIC STUDIES: No results found.  ASSESSMENT: [name] 81 y.o. male with a history of  1. Autoimmune neutropenia. - He continues to do well overall.  His WBC and ANC has remained normal since he is off Neupogen 6 weeks ago. -I recommend to stop cyclosporine from tomorrow, repeat a CBC with differential every 2 weeks, I'll see him back in 1 months.  2. 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 Verl Blalock 12/22/2011 without significant finding. Last CEA level is 2.7 on 02/12/2014.  -continue follow up, CEA  normal.  4. Anemia.  - Likely related to mild renal insufficiency. Stable -We discussed the role of Aranesp for anemia of chronic disease related to CKD. He is asymptomatic, will hold on for now.   5. CKD stage III, dementia, HTN, DM, CAD  -Follow up with primary care physician   Follow-up.  -CBC in 2 and 4 weeks,I will see him back in 4 weeks   All questions were answered. The patient knows to call the clinic with any problems, questions or concerns. We can certainly see the patient much sooner if necessary.  I spent 20 minutes counseling the patient face to face. The total time spent in the appointment was 25 minutes.    Truitt Merle, MD 05/14/2015  12:12 PMy

## 2015-05-14 NOTE — Telephone Encounter (Signed)
Gave and printed appt sched and avs for pt for JUNE °

## 2015-05-15 DIAGNOSIS — M25512 Pain in left shoulder: Secondary | ICD-10-CM | POA: Diagnosis not present

## 2015-05-15 DIAGNOSIS — M6281 Muscle weakness (generalized): Secondary | ICD-10-CM | POA: Diagnosis not present

## 2015-05-18 ENCOUNTER — Other Ambulatory Visit: Payer: Self-pay | Admitting: Emergency Medicine

## 2015-05-20 ENCOUNTER — Encounter: Payer: Self-pay | Admitting: Emergency Medicine

## 2015-05-20 ENCOUNTER — Ambulatory Visit (INDEPENDENT_AMBULATORY_CARE_PROVIDER_SITE_OTHER): Payer: Medicare Other | Admitting: Emergency Medicine

## 2015-05-20 VITALS — BP 167/77 | HR 73 | Temp 98.1°F | Resp 16 | Ht 75.0 in | Wt 224.6 lb

## 2015-05-20 DIAGNOSIS — E1121 Type 2 diabetes mellitus with diabetic nephropathy: Secondary | ICD-10-CM

## 2015-05-20 DIAGNOSIS — R569 Unspecified convulsions: Secondary | ICD-10-CM

## 2015-05-20 DIAGNOSIS — N289 Disorder of kidney and ureter, unspecified: Secondary | ICD-10-CM | POA: Diagnosis not present

## 2015-05-20 DIAGNOSIS — M1000 Idiopathic gout, unspecified site: Secondary | ICD-10-CM

## 2015-05-20 DIAGNOSIS — K4091 Unilateral inguinal hernia, without obstruction or gangrene, recurrent: Secondary | ICD-10-CM | POA: Diagnosis not present

## 2015-05-20 DIAGNOSIS — Z23 Encounter for immunization: Secondary | ICD-10-CM | POA: Diagnosis not present

## 2015-05-20 DIAGNOSIS — M25512 Pain in left shoulder: Secondary | ICD-10-CM | POA: Diagnosis not present

## 2015-05-20 DIAGNOSIS — I25810 Atherosclerosis of coronary artery bypass graft(s) without angina pectoris: Secondary | ICD-10-CM

## 2015-05-20 DIAGNOSIS — M6281 Muscle weakness (generalized): Secondary | ICD-10-CM | POA: Diagnosis not present

## 2015-05-20 LAB — POCT GLYCOSYLATED HEMOGLOBIN (HGB A1C): Hemoglobin A1C: 7.6

## 2015-05-20 LAB — GLUCOSE, POCT (MANUAL RESULT ENTRY): POC Glucose: 261 mg/dl — AB (ref 70–99)

## 2015-05-20 NOTE — Progress Notes (Signed)
Subjective:  This chart was scribed for [name] Russian, MD by Tamsen Roers, at [facility].  This patient was seen in room 22 and the patient's care was started at 3:57 PM.    Patient ID: [name], male    DOB: 01-14-1932, 81 y.o.   MRN: [id number] Chief Complaint  Patient presents with  . Annual Exam    AWV-S    HPI  HPI Comments: [name] is a 81 y.o. male who presents to the [facility] for an annual exam. Per daughter, she says his sugar has been as high as 190 in the mornings. She states that he is not eating anything worse than he was before and is unsure why it is so high. Patient eats sweets every night after dinner but he has not increased his sweet intake.  Patient notes that the biopsy taken 5 weeks ago from his right leg still has not healed correctly.  Patient is compliant with seeing his oncologist and ophthalmologist.  He has no other concerns today.      Past Medical History  Diagnosis Date  . Aortic root dilatation   . DM2 (diabetes mellitus, type 2)   . Stroke   . HLD (hyperlipidemia)   . HTN (hypertension)   . CAD (coronary artery disease)     Presumed from previously mildy abnormal myoview;  myoview 4/12: EF 73%, no ischemia  . Leukopenia   . Pneumothorax   . Rectal cancer   . Cardiomyopathy     a. echo 5/12: EF 45-50%, mild LVH, grade 2 diast dysfxn, RAE  . Colon cancer   . Chronic kidney disease     renal insuff, increased uric acid  . Seizures   . Diabetes mellitus   . BPH (benign prostatic hyperplasia)     increased PSA  . Anemia     Current Outpatient Prescriptions on File Prior to Visit  Medication Sig Dispense Refill  . allopurinol (ZYLOPRIM) 100 MG tablet Take two (2) tablets by mouth twice a day. (Patient taking differently: Take 100 mg by mouth daily. Take one tablet by mouth twice a day.) 120 tablet 3  . amLODipine (NORVASC) 5 MG tablet TAKE 1 TABLET BY MOUTH EVERY DAY 30 tablet 5    . aspirin 81 MG tablet Take 81 mg by mouth daily.      . B-D INS SYR MICROFINE .5CC/28G 28G X 1/2" 0.5 ML MISC See admin instructions.  3  . B-D INS SYR MICROFINE 1CC/28G 28G X 1/2" 1 ML MISC USE AS DIRECTED 100 each 3  . donepezil (ARICEPT) 10 MG tablet     . donepezil (ARICEPT) 5 MG tablet Take 5 mg by mouth at bedtime as needed.    . doxazosin (CARDURA) 4 MG tablet TAKE 1 TABLET BY MOUTH AT BEDTIME. 90 tablet 0  . finasteride (PROSCAR) 5 MG tablet TAKE 1 TABLET BY MOUTH EVERY DAY 30 tablet 5  . furosemide (LASIX) 20 MG tablet TAKE 1 TABLET BY MOUTH EVERY DAY 90 tablet 3  . insulin regular (HUMULIN R) 100 units/mL injection Check 30-60 minutes prior to each meal. Give sliding-scale insulin as instructed 20 mL 11  . lamoTRIgine (LAMICTAL) 100 MG tablet Take 100 mg by mouth 3 (three) times daily.  3  . metoprolol succinate (TOPROL-XL) 25 MG 24 hr tablet TAKE 1 TABLET BY MOUTH EVERY DAY 30 tablet 0  . mirtazapine (REMERON) 15 MG tablet TAKE 1 TABLET  AT BEDTIME 30 tablet 3  . mupirocin cream (BACTROBAN) 2 %   10  . nitroGLYCERIN (NITROSTAT) 0.4 MG SL tablet Place 1 tablet (0.4 mg total) under the tongue every 5 (five) minutes as needed for chest pain. 25 tablet 3  . Nutritional Supplements (JUICE PLUS FIBRE PO) Take 2 tablets by mouth 2 (two) times daily.    . pravastatin (PRAVACHOL) 20 MG tablet Take 1 tablet (20 mg total) by mouth daily. 90 tablet 4  . cycloSPORINE modified (NEORAL) 25 MG capsule Take 5 capsules (125 mg total) by mouth 2 (two) times daily. (Patient not taking: Reported on 05/20/2015) 300 capsule 5  . ONE TOUCH ULTRA TEST test strip CHECK BLOOD SUGAR 3 TIMES A DAY AS DIRECTED 100 each 8   No current facility-administered medications on file prior to visit.   Not on File  Recent Results (from the past 2160 hour(s))  [facility] DIABETES EYE EXAM     Status: None   Collection Time: 03/12/15  4:53 PM  Result Value Ref Range   [facility] Diabetic Eye Exam  No Retinopathy  CBC with Differential      Status: Abnormal   Collection Time: 03/19/15  9:56 AM  Result Value Ref Range   WBC 21.3 (H) 4.0 - 10.3 10e3/uL   NEUT# 17.7 (H) 1.5 - 6.5 10e3/uL   HGB 10.4 (L) 13.0 - 17.1 g/dL   HCT 33.0 (L) 38.4 - 49.9 %   Platelets 233 140 - 400 10e3/uL   MCV 88.3 79.3 - 98.0 fL   MCH 27.8 27.2 - 33.4 pg   MCHC 31.5 (L) 32.0 - 36.0 g/dL   RBC 3.74 (L) 4.20 - 5.82 10e6/uL   RDW 16.2 (H) 11.0 - 14.6 %   lymph# 2.8 0.9 - 3.3 10e3/uL   MONO# 0.5 0.1 - 0.9 10e3/uL   Eosinophils Absolute 0.2 0.0 - 0.5 10e3/uL   Basophils Absolute 0.1 0.0 - 0.1 10e3/uL   NEUT% 82.9 (H) 39.0 - 75.0 %   LYMPH% 13.3 (L) 14.0 - 49.0 %   MONO% 2.3 0.0 - 14.0 %   EOS% 1.1 0.0 - 7.0 %   BASO% 0.4 0.0 - 2.0 %  CBC with Differential     Status: Abnormal   Collection Time: 04/02/15 11:22 AM  Result Value Ref Range   WBC 7.6 4.0 - 10.3 10e3/uL   NEUT# 4.6 1.5 - 6.5 10e3/uL   HGB 10.5 (L) 13.0 - 17.1 g/dL   HCT 32.1 (L) 38.4 - 49.9 %   Platelets 235 140 - 400 10e3/uL   MCV 88.3 79.3 - 98.0 fL   MCH 28.7 27.2 - 33.4 pg   MCHC 32.5 32.0 - 36.0 g/dL   RBC 3.64 (L) 4.20 - 5.82 10e6/uL   RDW 15.8 (H) 11.0 - 14.6 %   lymph# 2.5 0.9 - 3.3 10e3/uL   MONO# 0.3 0.1 - 0.9 10e3/uL   Eosinophils Absolute 0.1 0.0 - 0.5 10e3/uL   Basophils Absolute 0.1 0.0 - 0.1 10e3/uL   NEUT% 60.6 39.0 - 75.0 %   LYMPH% 32.4 14.0 - 49.0 %   MONO% 4.4 0.0 - 14.0 %   EOS% 1.5 0.0 - 7.0 %   BASO% 1.1 0.0 - 2.0 %  CEA     Status: None   Collection Time: 04/02/15 11:22 AM  Result Value Ref Range   CEA 2.6 0.0 - 5.0 ng/mL  Comprehensive metabolic panel (Cmet) - [facility]     Status: Abnormal   Collection Time:  04/02/15 11:22 AM  Result Value Ref Range   Sodium 139 136 - 145 mEq/L   Potassium 5.3 (H) 3.5 - 5.1 mEq/L   Chloride 104 98 - 109 mEq/L   CO2 24 22 - 29 mEq/L   Glucose 192 (H) 70 - 140 mg/dl   BUN 27.0 (H) 7.0 - 26.0 mg/dL   Creatinine 1.9 (H) 0.7 - 1.3 mg/dL   Total Bilirubin 0.43 0.20 - 1.20 mg/dL   Alkaline Phosphatase 115 40 -  150 U/L   AST 13 5 - 34 U/L   ALT 11 0 - 55 U/L   Total Protein 6.7 6.4 - 8.3 g/dL   Albumin 3.9 3.5 - 5.0 g/dL   Calcium 8.6 8.4 - 10.4 mg/dL   Anion Gap 11 3 - 11 mEq/L   EGFR 33 (L) >90 ml/min/1.73 m2    Comment: eGFR is calculated using the CKD-EPI Creatinine Equation (2009)  CBC with Differential     Status: Abnormal   Collection Time: 04/09/15 11:23 AM  Result Value Ref Range   WBC 7.3 4.0 - 10.3 10e3/uL   NEUT# 3.9 1.5 - 6.5 10e3/uL   HGB 10.4 (L) 13.0 - 17.1 g/dL   HCT 32.8 (L) 38.4 - 49.9 %   Platelets 234 140 - 400 10e3/uL   MCV 88.9 79.3 - 98.0 fL   MCH 28.1 27.2 - 33.4 pg   MCHC 31.6 (L) 32.0 - 36.0 g/dL   RBC 3.69 (L) 4.20 - 5.82 10e6/uL   RDW 15.4 (H) 11.0 - 14.6 %   lymph# 2.7 0.9 - 3.3 10e3/uL   MONO# 0.4 0.1 - 0.9 10e3/uL   Eosinophils Absolute 0.2 0.0 - 0.5 10e3/uL   Basophils Absolute 0.1 0.0 - 0.1 10e3/uL   NEUT% 53.6 39.0 - 75.0 %   LYMPH% 37.3 14.0 - 49.0 %   MONO% 6.0 0.0 - 14.0 %   EOS% 2.1 0.0 - 7.0 %   BASO% 1.0 0.0 - 2.0 %  CBC with Differential     Status: Abnormal   Collection Time: 04/16/15 11:11 AM  Result Value Ref Range   WBC 7.0 4.0 - 10.3 10e3/uL   NEUT# 4.3 1.5 - 6.5 10e3/uL   HGB 10.6 (L) 13.0 - 17.1 g/dL   HCT 32.6 (L) 38.4 - 49.9 %   Platelets 213 140 - 400 10e3/uL   MCV 89.1 79.3 - 98.0 fL   MCH 29.0 27.2 - 33.4 pg   MCHC 32.5 32.0 - 36.0 g/dL   RBC 3.66 (L) 4.20 - 5.82 10e6/uL   RDW 14.2 11.0 - 14.6 %   lymph# 2.3 0.9 - 3.3 10e3/uL   MONO# 0.3 0.1 - 0.9 10e3/uL   Eosinophils Absolute 0.1 0.0 - 0.5 10e3/uL   Basophils Absolute 0.0 0.0 - 0.1 10e3/uL   NEUT% 61.4 39.0 - 75.0 %   LYMPH% 32.2 14.0 - 49.0 %   MONO% 4.4 0.0 - 14.0 %   EOS% 1.4 0.0 - 7.0 %   BASO% 0.6 0.0 - 2.0 %  CBC with Differential     Status: Abnormal   Collection Time: 05/14/15 11:14 AM  Result Value Ref Range   WBC 7.0 4.0 - 10.3 10e3/uL   NEUT# 3.7 1.5 - 6.5 10e3/uL   HGB 11.1 (L) 13.0 - 17.1 g/dL   HCT 34.2 (L) 38.4 - 49.9 %   Platelets 208 140 -  400 10e3/uL   MCV 87.9 79.3 - 98.0 fL   MCH 28.5 27.2 - 33.4 pg  MCHC 32.5 32.0 - 36.0 g/dL   RBC 3.89 (L) 4.20 - 5.82 10e6/uL   RDW 13.9 11.0 - 14.6 %   lymph# 2.7 0.9 - 3.3 10e3/uL   MONO# 0.5 0.1 - 0.9 10e3/uL   Eosinophils Absolute 0.1 0.0 - 0.5 10e3/uL   Basophils Absolute 0.0 0.0 - 0.1 10e3/uL   NEUT% 52.7 39.0 - 75.0 %   LYMPH% 38.8 14.0 - 49.0 %   MONO% 6.4 0.0 - 14.0 %   EOS% 1.7 0.0 - 7.0 %   BASO% 0.4 0.0 - 2.0 %       Review of Systems  Constitutional: Negative.   HENT: Negative.   Eyes: Negative.   Respiratory: Negative.   Cardiovascular: Negative.   Gastrointestinal: Negative.   Endocrine: Negative.   Genitourinary: Negative.   Neurological:       No seizures since his last visit       Objective:   Physical Exam   CONSTITUTIONAL: He is a frail elderly gentleman who is not in any distress HEAD: Normocephalic/atraumatic EYES: EOMI/PERRL ENMT: Mucous membranes moist NECK: supple no meningeal signs SPINE/BACK:entire spine nontender CV: a very irregular rhythm  LUNGS: Lungs are clear to auscultation bilaterally, no apparent distress ABDOMEN: soft, nontender, no rebound or guarding, bowel sounds noted throughout abdomen, Large right inguinal hernia GU:no cva tenderness NEURO: Pt is awake/alert/appropriate, moves all extremitiesx4.  No facial droop.   EXTREMITIES: 1+ edema both legs with a nodular 1.5 cm lesion of his right shin with S scar over biopsy site.  SKIN: warm, color normal PSYCH: no abnormalities of mood noted, alert and oriented to situation       Filed Vitals:   05/20/15 1524  BP: 167/77  Pulse: 73  Temp: 98.1 F (36.7 C)  TempSrc: Oral  Resp: 16  Height: _0  (1.905 m)  Weight: 224 lb 9.6 oz (101.878 kg)  SpO2: 97%   Results for orders placed or performed in visit on 05/20/15  POCT glucose (manual entry)  Result Value Ref Range   POC Glucose 261 (A) 70 - 99 mg/dl  POCT glycosylated hemoglobin (Hb A1C)  Result Value Ref  Range   Hemoglobin A1C 7.6            Assessment & Plan:  1. Type 2 diabetes mellitus with diabetic nephropathy I did not change his insulin dosage for fear of hypoglycemia. - POCT glucose (manual entry) - POCT glycosylated hemoglobin (Hb A1C)  2. Seizures No seizures since his last visit  3. Coronary artery disease involving coronary bypass graft of native heart without angina pectoris EKG is unchanged - EKG 12-Lead  4. Abnormal kidney function  - BASIC METABOLIC PANEL WITH GFR  5. Unilateral recurrent inguinal hernia without obstruction or gangrene He has decided against surgery  6. Idiopathic gout, unspecified chronicity, unspecified site  - Uric acid  7. Need for diphtheria-tetanus-pertussis (Tdap) vaccine, adult/adolescent T dap given today   I personally performed the services described in this documentation, which was scribed in my presence. The recorded information has been reviewed and is accurate.  [name], MD  [facility], [facility]  05/20/2015 6:05 PM

## 2015-05-20 NOTE — Progress Notes (Deleted)
   Subjective:    Patient ID: [name], male    DOB: 1932/01/04, 81 y.o.   MRN: [id number]  HPI    Review of Systems  Constitutional: Negative.   HENT: Negative.   Eyes: Negative.   Respiratory: Negative.   Gastrointestinal: Negative.   Endocrine: Negative.   Genitourinary: Negative.   Musculoskeletal: Negative.   Skin: Negative.   Allergic/Immunologic: Negative.   Neurological: Negative.   Hematological: Negative.   Psychiatric/Behavioral: Negative.        Objective:   Physical Exam        Assessment & Plan:

## 2015-05-21 LAB — BASIC METABOLIC PANEL WITH GFR
BUN: 26 mg/dL — AB (ref 6–23)
CALCIUM: 8.7 mg/dL (ref 8.4–10.5)
CO2: 24 meq/L (ref 19–32)
Chloride: 100 mEq/L (ref 96–112)
Creat: 2 mg/dL — ABNORMAL HIGH (ref 0.50–1.35)
GFR, EST NON AFRICAN AMERICAN: 30 mL/min — AB
GFR, Est African American: 35 mL/min — ABNORMAL LOW
GLUCOSE: 230 mg/dL — AB (ref 70–99)
POTASSIUM: 4.9 meq/L (ref 3.5–5.3)
Sodium: 136 mEq/L (ref 135–145)

## 2015-05-21 LAB — URIC ACID: Uric Acid, Serum: 5.6 mg/dL (ref 4.0–7.8)

## 2015-05-22 DIAGNOSIS — M6281 Muscle weakness (generalized): Secondary | ICD-10-CM | POA: Diagnosis not present

## 2015-05-22 DIAGNOSIS — G301 Alzheimer's disease with late onset: Secondary | ICD-10-CM | POA: Diagnosis not present

## 2015-05-22 DIAGNOSIS — G40309 Generalized idiopathic epilepsy and epileptic syndromes, not intractable, without status epilepticus: Secondary | ICD-10-CM | POA: Diagnosis not present

## 2015-05-22 DIAGNOSIS — M25512 Pain in left shoulder: Secondary | ICD-10-CM | POA: Diagnosis not present

## 2015-05-26 ENCOUNTER — Ambulatory Visit: Payer: Medicare Other | Admitting: Podiatry

## 2015-05-26 DIAGNOSIS — M25512 Pain in left shoulder: Secondary | ICD-10-CM | POA: Diagnosis not present

## 2015-05-26 DIAGNOSIS — M6281 Muscle weakness (generalized): Secondary | ICD-10-CM | POA: Diagnosis not present

## 2015-05-28 ENCOUNTER — Other Ambulatory Visit (HOSPITAL_BASED_OUTPATIENT_CLINIC_OR_DEPARTMENT_OTHER): Payer: Medicare Other

## 2015-05-28 DIAGNOSIS — Z862 Personal history of diseases of the blood and blood-forming organs and certain disorders involving the immune mechanism: Secondary | ICD-10-CM

## 2015-05-28 DIAGNOSIS — D708 Other neutropenia: Secondary | ICD-10-CM

## 2015-05-28 DIAGNOSIS — D709 Neutropenia, unspecified: Secondary | ICD-10-CM

## 2015-05-28 DIAGNOSIS — C44721 Squamous cell carcinoma of skin of unspecified lower limb, including hip: Secondary | ICD-10-CM | POA: Diagnosis not present

## 2015-05-28 DIAGNOSIS — C44722 Squamous cell carcinoma of skin of right lower limb, including hip: Secondary | ICD-10-CM | POA: Diagnosis not present

## 2015-05-28 DIAGNOSIS — C187 Malignant neoplasm of sigmoid colon: Secondary | ICD-10-CM | POA: Diagnosis not present

## 2015-05-28 DIAGNOSIS — D72819 Decreased white blood cell count, unspecified: Secondary | ICD-10-CM

## 2015-05-28 LAB — CBC WITH DIFFERENTIAL/PLATELET
BASO%: 0.6 % (ref 0.0–2.0)
Basophils Absolute: 0 10*3/uL (ref 0.0–0.1)
EOS%: 1.5 % (ref 0.0–7.0)
Eosinophils Absolute: 0.1 10*3/uL (ref 0.0–0.5)
HEMATOCRIT: 33.9 % — AB (ref 38.4–49.9)
HGB: 11 g/dL — ABNORMAL LOW (ref 13.0–17.1)
LYMPH#: 2.2 10*3/uL (ref 0.9–3.3)
LYMPH%: 28.6 % (ref 14.0–49.0)
MCH: 28.5 pg (ref 27.2–33.4)
MCHC: 32.5 g/dL (ref 32.0–36.0)
MCV: 87.5 fL (ref 79.3–98.0)
MONO#: 0.4 10*3/uL (ref 0.1–0.9)
MONO%: 5.1 % (ref 0.0–14.0)
NEUT#: 5 10*3/uL (ref 1.5–6.5)
NEUT%: 64.2 % (ref 39.0–75.0)
Platelets: 218 10*3/uL (ref 140–400)
RBC: 3.88 10*6/uL — ABNORMAL LOW (ref 4.20–5.82)
RDW: 14.5 % (ref 11.0–14.6)
WBC: 7.8 10*3/uL (ref 4.0–10.3)

## 2015-05-29 DIAGNOSIS — M25512 Pain in left shoulder: Secondary | ICD-10-CM | POA: Diagnosis not present

## 2015-05-29 DIAGNOSIS — M6281 Muscle weakness (generalized): Secondary | ICD-10-CM | POA: Diagnosis not present

## 2015-06-03 ENCOUNTER — Other Ambulatory Visit: Payer: Self-pay | Admitting: Emergency Medicine

## 2015-06-03 DIAGNOSIS — M25512 Pain in left shoulder: Secondary | ICD-10-CM | POA: Diagnosis not present

## 2015-06-03 DIAGNOSIS — M6281 Muscle weakness (generalized): Secondary | ICD-10-CM | POA: Diagnosis not present

## 2015-06-04 ENCOUNTER — Other Ambulatory Visit: Payer: Self-pay | Admitting: Emergency Medicine

## 2015-06-04 ENCOUNTER — Encounter: Payer: Self-pay | Admitting: Podiatry

## 2015-06-04 ENCOUNTER — Ambulatory Visit (INDEPENDENT_AMBULATORY_CARE_PROVIDER_SITE_OTHER): Payer: Medicare Other | Admitting: Podiatry

## 2015-06-04 ENCOUNTER — Telehealth: Payer: Self-pay

## 2015-06-04 ENCOUNTER — Telehealth: Payer: Self-pay | Admitting: Emergency Medicine

## 2015-06-04 DIAGNOSIS — G629 Polyneuropathy, unspecified: Secondary | ICD-10-CM

## 2015-06-04 DIAGNOSIS — E1142 Type 2 diabetes mellitus with diabetic polyneuropathy: Secondary | ICD-10-CM | POA: Diagnosis not present

## 2015-06-04 DIAGNOSIS — B351 Tinea unguium: Secondary | ICD-10-CM | POA: Diagnosis not present

## 2015-06-04 NOTE — Telephone Encounter (Signed)
Dr. Everlene Farrier   Does patient still need gout medicine?   If so will you refill the sure script that the pharmacy will be sending over?  [facility] [phone]

## 2015-06-04 NOTE — Telephone Encounter (Signed)
Let's put his allopurinol on hold and just stop it. He has had a decline in kidney function and with his diabetes and other medical problems and normal serum uric acid I think we can just put it on hold

## 2015-06-04 NOTE — Telephone Encounter (Signed)
He does still need his gout medicine but I would advise he only take 100 mg dose once a day. He does not need to take the medication twice a day only once a day

## 2015-06-04 NOTE — Telephone Encounter (Signed)
Left message for pt to call back  °

## 2015-06-04 NOTE — Telephone Encounter (Signed)
See new message from Dr. Everlene Farrier.

## 2015-06-04 NOTE — Telephone Encounter (Signed)
Allopurinol 100 mg 1 by mouth daily he can have #30 with 11 refills

## 2015-06-04 NOTE — Telephone Encounter (Signed)
Ok can we send in Rx? If so how many refills. We are referring to the Allopurinol?

## 2015-06-04 NOTE — Patient Instructions (Signed)
Diabetes and Foot Care Diabetes may cause you to have problems because of poor blood supply (circulation) to your feet and legs. This may cause the skin on your feet to become thinner, break easier, and heal more slowly. Your skin may become dry, and the skin may peel and crack. You may also have nerve damage in your legs and feet causing decreased feeling in them. You may not notice minor injuries to your feet that could lead to infections or more serious problems. Taking care of your feet is one of the most important things you can do for yourself.  HOME CARE INSTRUCTIONS  Wear shoes at all times, even in the house. Do not go barefoot. Bare feet are easily injured.  Check your feet daily for blisters, cuts, and redness. If you cannot see the bottom of your feet, use a mirror or ask someone for help.  Wash your feet with warm water (do not use hot water) and mild soap. Then pat your feet and the areas between your toes until they are completely dry. Do not soak your feet as this can dry your skin.  Apply a moisturizing lotion or petroleum jelly (that does not contain alcohol and is unscented) to the skin on your feet and to dry, brittle toenails. Do not apply lotion between your toes.  Trim your toenails straight across. Do not dig under them or around the cuticle. File the edges of your nails with an emery board or nail file.  Do not cut corns or calluses or try to remove them with medicine.  Wear clean socks or stockings every day. Make sure they are not too tight. Do not wear knee-high stockings since they may decrease blood flow to your legs.  Wear shoes that fit properly and have enough cushioning. To break in new shoes, wear them for just a few hours a day. This prevents you from injuring your feet. Always look in your shoes before you put them on to be sure there are no objects inside.  Do not cross your legs. This may decrease the blood flow to your feet.  If you find a minor scrape,  cut, or break in the skin on your feet, keep it and the skin around it clean and dry. These areas may be cleansed with mild soap and water. Do not cleanse the area with peroxide, alcohol, or iodine.  When you remove an adhesive bandage, be sure not to damage the skin around it.  If you have a wound, look at it several times a day to make sure it is healing.  Do not use heating pads or hot water bottles. They may burn your skin. If you have lost feeling in your feet or legs, you may not know it is happening until it is too late.  Make sure your health care provider performs a complete foot exam at least annually or more often if you have foot problems. Report any cuts, sores, or bruises to your health care provider immediately. SEEK MEDICAL CARE IF:   You have an injury that is not healing.  You have cuts or breaks in the skin.  You have an ingrown nail.  You notice redness on your legs or feet.  You feel burning or tingling in your legs or feet.  You have pain or cramps in your legs and feet.  Your legs or feet are numb.  Your feet always feel cold. SEEK IMMEDIATE MEDICAL CARE IF:   There is increasing redness,   swelling, or pain in or around a wound.  There is a red line that goes up your leg.  Pus is coming from a wound.  You develop a fever or as directed by your health care provider.  You notice a bad smell coming from an ulcer or wound. Document Released: 11/26/2000 Document Revised: 08/01/2013 Document Reviewed: 05/08/2013 Exit[employer] Patient Information 2015 [employer]. This information is not intended to replace advice given to you by your health care provider. Make sure you discuss any questions you have with your health care provider.  

## 2015-06-05 DIAGNOSIS — M6281 Muscle weakness (generalized): Secondary | ICD-10-CM | POA: Diagnosis not present

## 2015-06-05 DIAGNOSIS — M25512 Pain in left shoulder: Secondary | ICD-10-CM | POA: Diagnosis not present

## 2015-06-05 NOTE — Progress Notes (Signed)
Patient ID: [name], male   DOB: 1932/07/29, 81 y.o.   MRN: [id number]  Subjective: This patient presents for scheduled visit complaining of painful toenails and requests toenail debridement  Objective: The toenails are brittle, elongated, hypertrophic, incurvated 6-10  Assessment: Mycotic toenails 6-10 Diabetic peripheral neuropathy  Plan: Debridement toenails 10 without any bleeding  Reappoint 3 months

## 2015-06-05 NOTE — Telephone Encounter (Signed)
Spoke with pt, advise message from Dr. Everlene Farrier. Pt understood.

## 2015-06-10 DIAGNOSIS — M25512 Pain in left shoulder: Secondary | ICD-10-CM | POA: Diagnosis not present

## 2015-06-10 DIAGNOSIS — M6281 Muscle weakness (generalized): Secondary | ICD-10-CM | POA: Diagnosis not present

## 2015-06-12 ENCOUNTER — Other Ambulatory Visit (HOSPITAL_BASED_OUTPATIENT_CLINIC_OR_DEPARTMENT_OTHER): Payer: Medicare Other

## 2015-06-12 ENCOUNTER — Encounter: Payer: Self-pay | Admitting: Hematology

## 2015-06-12 ENCOUNTER — Ambulatory Visit (HOSPITAL_BASED_OUTPATIENT_CLINIC_OR_DEPARTMENT_OTHER): Payer: Medicare Other | Admitting: Hematology

## 2015-06-12 ENCOUNTER — Telehealth: Payer: Self-pay | Admitting: Hematology

## 2015-06-12 VITALS — BP 156/61 | HR 58 | Resp 18 | Ht 75.0 in | Wt 222.8 lb

## 2015-06-12 DIAGNOSIS — F0390 Unspecified dementia without behavioral disturbance: Secondary | ICD-10-CM | POA: Diagnosis not present

## 2015-06-12 DIAGNOSIS — E119 Type 2 diabetes mellitus without complications: Secondary | ICD-10-CM

## 2015-06-12 DIAGNOSIS — Z862 Personal history of diseases of the blood and blood-forming organs and certain disorders involving the immune mechanism: Secondary | ICD-10-CM

## 2015-06-12 DIAGNOSIS — N183 Chronic kidney disease, stage 3 (moderate): Secondary | ICD-10-CM | POA: Diagnosis not present

## 2015-06-12 DIAGNOSIS — Z85038 Personal history of other malignant neoplasm of large intestine: Secondary | ICD-10-CM

## 2015-06-12 DIAGNOSIS — D649 Anemia, unspecified: Secondary | ICD-10-CM

## 2015-06-12 DIAGNOSIS — D709 Neutropenia, unspecified: Secondary | ICD-10-CM

## 2015-06-12 DIAGNOSIS — D708 Other neutropenia: Secondary | ICD-10-CM

## 2015-06-12 DIAGNOSIS — C187 Malignant neoplasm of sigmoid colon: Secondary | ICD-10-CM

## 2015-06-12 DIAGNOSIS — M6281 Muscle weakness (generalized): Secondary | ICD-10-CM | POA: Diagnosis not present

## 2015-06-12 DIAGNOSIS — D72819 Decreased white blood cell count, unspecified: Secondary | ICD-10-CM

## 2015-06-12 DIAGNOSIS — M25512 Pain in left shoulder: Secondary | ICD-10-CM | POA: Diagnosis not present

## 2015-06-12 DIAGNOSIS — I10 Essential (primary) hypertension: Secondary | ICD-10-CM

## 2015-06-12 LAB — CBC WITH DIFFERENTIAL/PLATELET
BASO%: 0.5 % (ref 0.0–2.0)
Basophils Absolute: 0 10*3/uL (ref 0.0–0.1)
EOS%: 1.7 % (ref 0.0–7.0)
Eosinophils Absolute: 0.1 10*3/uL (ref 0.0–0.5)
HCT: 33.5 % — ABNORMAL LOW (ref 38.4–49.9)
HEMOGLOBIN: 10.8 g/dL — AB (ref 13.0–17.1)
LYMPH%: 28.7 % (ref 14.0–49.0)
MCH: 28.1 pg (ref 27.2–33.4)
MCHC: 32.3 g/dL (ref 32.0–36.0)
MCV: 86.9 fL (ref 79.3–98.0)
MONO#: 0.3 10*3/uL (ref 0.1–0.9)
MONO%: 4.7 % (ref 0.0–14.0)
NEUT%: 64.4 % (ref 39.0–75.0)
NEUTROS ABS: 4.7 10*3/uL (ref 1.5–6.5)
Platelets: 240 10*3/uL (ref 140–400)
RBC: 3.85 10*6/uL — ABNORMAL LOW (ref 4.20–5.82)
RDW: 14.9 % — ABNORMAL HIGH (ref 11.0–14.6)
WBC: 7.3 10*3/uL (ref 4.0–10.3)
lymph#: 2.1 10*3/uL (ref 0.9–3.3)

## 2015-06-12 NOTE — Progress Notes (Signed)
[facility] OFFICE PROGRESS NOTE  [name], MD [place] [address]  DIAGNOSIS: Neutropenia   CURRENT THERAPY:  1. Neupogen 300 mcg subcu,  started in late December 2011, he was on 3-4 times weekly initially, and reduced to once weekly (Tuesday) since 12/13/2014, stopped on 03/19/2015 2. Cyclosporine 125 mg twice daily. Cyclosporine was started in August 2012, and stopped on 05/14/2015   INTERVAL HISTORY: OTHELLO DICKENSON 81 y.o. male with a history of autoimmune neutropenia is here for follow-up.  He has been off cyclosporine for 4 weeks now, and his WBC remains normal. He did not have fever or chills at home. He reports an episodes of left palm itchiness, no skin rash or pain, he used benadryl cream and finally felt to sleep. No issue today.   MEDICAL HISTORY: Past Medical History  Diagnosis Date  . Aortic root dilatation   . DM2 (diabetes mellitus, type 2)   . Stroke   . HLD (hyperlipidemia)   . HTN (hypertension)   . CAD (coronary artery disease)     Presumed from previously mildy abnormal myoview;  myoview 4/12: EF 73%, no ischemia  . Leukopenia   . Pneumothorax   . Rectal cancer   . Cardiomyopathy     a. echo 5/12: EF 45-50%, mild LVH, grade 2 diast dysfxn, RAE  . Colon cancer   . Chronic kidney disease     renal insuff, increased uric acid  . Seizures   . Diabetes mellitus   . BPH (benign prostatic hyperplasia)     increased PSA  . Anemia      ALLERGIES:  has No Known Allergies.  MEDICATIONS: has a current medication list which includes the following prescription(s): allopurinol, amlodipine, aspirin, b-d ins syr microfine .5cc/28g, b-d ins syr microfine 1cc/28g, cyclosporine modified, donepezil, doxazosin, finasteride, furosemide, insulin regular, lamotrigine, metoprolol succinate, mirtazapine, mupirocin cream, nitroglycerin, nutritional supplements, one touch ultra test, and pravastatin.  SURGICAL HISTORY:  Past Surgical History   Procedure Laterality Date  . Hip arthroplasty    . Tonsillectomy    . S/p resection of rectal cancer    . Vasectomy    . Ileostomy  12/2010  . Reversal ileostomy  03/2011  . Colon surgery    . Joint replacement      REVIEW OF SYSTEMS:   Constitutional: Denies fevers, chills or abnormal weight loss Eyes: Denies blurriness of vision Ears, nose, mouth, throat, and face: Denies mucositis or sore throat Respiratory: Denies cough, dyspnea or wheezes Cardiovascular: Denies palpitation, chest discomfort or lower extremity swelling Gastrointestinal:  Denies nausea, heartburn or change in bowel habits Skin: Denies abnormal skin rashes Lymphatics: Denies new lymphadenopathy or easy bruising Neurological:Denies numbness, tingling or new weaknesses Behavioral/Psych: Mood is stable, no new changes  All other systems were reviewed with the patient and are negative.  PHYSICAL EXAMINATION: ECOG PERFORMANCE STATUS: 1  Blood pressure 156/61, pulse 58, resp. rate 18, height 6\' 3"  (1.905 m), weight 222 lb 12.8 oz (101.061 kg), SpO2 100 %.  GENERAL:alert, no distress and comfortable; chronically ill appearing.  SKIN: skin color, texture, turgor are normal, no rashes;  Chronic scaling of skin.  EYES: normal, Conjunctiva are pink and non-injected, sclera clear OROPHARYNX:no exudate, no erythema and lips, buccal mucosa, and tongue normal  NECK: supple, thyroid normal size, non-tender, without nodularity LYMPH:  no palpable lymphadenopathy in the cervical, axillary or supraclavicular LUNGS: clear to auscultation and percussion with normal breathing effort HEART: regular rate & rhythm  and no murmurs and trace lower extremity edema bilaterally. ABDOMEN:abdomen soft, non-tender and normal bowel sounds Musculoskeletal:no cyanosis of digits and no clubbing; R arm with decreased ROM.  NEURO: alert & oriented x 3 with fluent speech, no focal motor/sensory deficits  LABORATORY DATA: CBC Latest Ref Rng  06/12/2015 05/28/2015 05/14/2015  WBC 4.0 - 10.3 10e3/uL 7.3 7.8 7.0  Hemoglobin 13.0 - 17.1 g/dL 10.8(L) 11.0(L) 11.1(L)  Hematocrit 38.4 - 49.9 % 33.5(L) 33.9(L) 34.2(L)  Platelets 140 - 400 10e3/uL 240 218 208    CMP Latest Ref Rng 05/20/2015 04/02/2015 11/22/2014  Glucose 70 - 99 mg/dL 230(H) 192(H) 164(H)  BUN 6 - 23 mg/dL 26(H) 27.0(H) 22.4  Creatinine 0.50 - 1.35 mg/dL 2.00(H) 1.9(H) 1.9(H)  Sodium 135 - 145 mEq/L 136 139 141  Potassium 3.5 - 5.3 mEq/L 4.9 5.3(H) 4.9  Chloride 96 - 112 mEq/L 100 - -  CO2 19 - 32 mEq/L 24 24 26   Calcium 8.4 - 10.5 mg/dL 8.7 8.6 9.2  Total Protein 6.4 - 8.3 g/dL - 6.7 -  Total Bilirubin 0.20 - 1.20 mg/dL - 0.43 -  Alkaline Phos 40 - 150 U/L - 115 -  AST 5 - 34 U/L - 13 -  ALT 0 - 55 U/L - 11 -    RADIOGRAPHIC STUDIES: No results found.  ASSESSMENT: ANGELINA NEECE 80 y.o. male with a history of  1. Autoimmune neutropenia. - He continues to do well overall.  His WBC and ANC has remained normal since he is off Neupogen and cyclosporine. -Continue monitoring.  2. 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 Verl Blalock 12/22/2011 without significant finding.  -continue follow up, CEA normal in 03/2015 -No evidence of recurrence  4. Anemia.  - Likely related to mild renal insufficiency. Stable -We discussed the role of Aranesp for anemia of chronic disease related to CKD. He is asymptomatic, will hold on for now.   5. CKD stage III, dementia, HTN, DM, CAD  -Follow up with primary care physician   Follow-up.  -CBC in 2 month, I will see him back in 4 months  All questions were answered. The patient knows to call the clinic with any problems, questions or concerns. We can certainly see the patient much sooner if necessary.  I spent 15 minutes counseling the patient face to face. The total time spent in the appointment was 20 minutes.    Truitt Merle, MD 06/12/2015  1:39 PMy

## 2015-06-12 NOTE — Telephone Encounter (Signed)
per pof to sch pt appt-gave pt copy of avs °

## 2015-06-17 DIAGNOSIS — M6281 Muscle weakness (generalized): Secondary | ICD-10-CM | POA: Diagnosis not present

## 2015-06-17 DIAGNOSIS — M25512 Pain in left shoulder: Secondary | ICD-10-CM | POA: Diagnosis not present

## 2015-06-19 ENCOUNTER — Other Ambulatory Visit: Payer: Self-pay | Admitting: Physician Assistant

## 2015-06-19 ENCOUNTER — Other Ambulatory Visit: Payer: Self-pay | Admitting: Emergency Medicine

## 2015-06-19 DIAGNOSIS — M6281 Muscle weakness (generalized): Secondary | ICD-10-CM | POA: Diagnosis not present

## 2015-06-19 DIAGNOSIS — M25512 Pain in left shoulder: Secondary | ICD-10-CM | POA: Diagnosis not present

## 2015-06-24 DIAGNOSIS — M6281 Muscle weakness (generalized): Secondary | ICD-10-CM | POA: Diagnosis not present

## 2015-06-24 DIAGNOSIS — M25512 Pain in left shoulder: Secondary | ICD-10-CM | POA: Diagnosis not present

## 2015-06-25 DIAGNOSIS — L570 Actinic keratosis: Secondary | ICD-10-CM | POA: Diagnosis not present

## 2015-06-25 DIAGNOSIS — C4492 Squamous cell carcinoma of skin, unspecified: Secondary | ICD-10-CM | POA: Diagnosis not present

## 2015-06-26 DIAGNOSIS — M25512 Pain in left shoulder: Secondary | ICD-10-CM | POA: Diagnosis not present

## 2015-06-26 DIAGNOSIS — M6281 Muscle weakness (generalized): Secondary | ICD-10-CM | POA: Diagnosis not present

## 2015-07-01 DIAGNOSIS — M6281 Muscle weakness (generalized): Secondary | ICD-10-CM | POA: Diagnosis not present

## 2015-07-01 DIAGNOSIS — M25512 Pain in left shoulder: Secondary | ICD-10-CM | POA: Diagnosis not present

## 2015-07-03 DIAGNOSIS — M25512 Pain in left shoulder: Secondary | ICD-10-CM | POA: Diagnosis not present

## 2015-07-03 DIAGNOSIS — M6281 Muscle weakness (generalized): Secondary | ICD-10-CM | POA: Diagnosis not present

## 2015-07-08 DIAGNOSIS — M6281 Muscle weakness (generalized): Secondary | ICD-10-CM | POA: Diagnosis not present

## 2015-07-08 DIAGNOSIS — M25512 Pain in left shoulder: Secondary | ICD-10-CM | POA: Diagnosis not present

## 2015-07-10 DIAGNOSIS — E119 Type 2 diabetes mellitus without complications: Secondary | ICD-10-CM | POA: Diagnosis not present

## 2015-07-10 DIAGNOSIS — M25512 Pain in left shoulder: Secondary | ICD-10-CM | POA: Diagnosis not present

## 2015-07-10 DIAGNOSIS — H25813 Combined forms of age-related cataract, bilateral: Secondary | ICD-10-CM | POA: Diagnosis not present

## 2015-07-10 DIAGNOSIS — M6281 Muscle weakness (generalized): Secondary | ICD-10-CM | POA: Diagnosis not present

## 2015-07-17 DIAGNOSIS — M25512 Pain in left shoulder: Secondary | ICD-10-CM | POA: Diagnosis not present

## 2015-07-17 DIAGNOSIS — M6281 Muscle weakness (generalized): Secondary | ICD-10-CM | POA: Diagnosis not present

## 2015-07-18 ENCOUNTER — Other Ambulatory Visit: Payer: Self-pay | Admitting: Physician Assistant

## 2015-07-18 DIAGNOSIS — L570 Actinic keratosis: Secondary | ICD-10-CM | POA: Diagnosis not present

## 2015-07-18 DIAGNOSIS — D0461 Carcinoma in situ of skin of right upper limb, including shoulder: Secondary | ICD-10-CM | POA: Diagnosis not present

## 2015-07-18 DIAGNOSIS — C4442 Squamous cell carcinoma of skin of scalp and neck: Secondary | ICD-10-CM | POA: Diagnosis not present

## 2015-07-18 DIAGNOSIS — D0462 Carcinoma in situ of skin of left upper limb, including shoulder: Secondary | ICD-10-CM | POA: Diagnosis not present

## 2015-07-18 DIAGNOSIS — D044 Carcinoma in situ of skin of scalp and neck: Secondary | ICD-10-CM | POA: Diagnosis not present

## 2015-07-18 DIAGNOSIS — D485 Neoplasm of uncertain behavior of skin: Secondary | ICD-10-CM | POA: Diagnosis not present

## 2015-07-18 DIAGNOSIS — C44622 Squamous cell carcinoma of skin of right upper limb, including shoulder: Secondary | ICD-10-CM | POA: Diagnosis not present

## 2015-07-22 DIAGNOSIS — M6281 Muscle weakness (generalized): Secondary | ICD-10-CM | POA: Diagnosis not present

## 2015-07-22 DIAGNOSIS — M25512 Pain in left shoulder: Secondary | ICD-10-CM | POA: Diagnosis not present

## 2015-07-24 DIAGNOSIS — M6281 Muscle weakness (generalized): Secondary | ICD-10-CM | POA: Diagnosis not present

## 2015-07-24 DIAGNOSIS — M25512 Pain in left shoulder: Secondary | ICD-10-CM | POA: Diagnosis not present

## 2015-07-27 ENCOUNTER — Other Ambulatory Visit: Payer: Self-pay | Admitting: Emergency Medicine

## 2015-07-28 ENCOUNTER — Other Ambulatory Visit: Payer: Self-pay | Admitting: Emergency Medicine

## 2015-07-29 DIAGNOSIS — M25512 Pain in left shoulder: Secondary | ICD-10-CM | POA: Diagnosis not present

## 2015-07-29 DIAGNOSIS — M6281 Muscle weakness (generalized): Secondary | ICD-10-CM | POA: Diagnosis not present

## 2015-07-31 DIAGNOSIS — M25512 Pain in left shoulder: Secondary | ICD-10-CM | POA: Diagnosis not present

## 2015-07-31 DIAGNOSIS — M6281 Muscle weakness (generalized): Secondary | ICD-10-CM | POA: Diagnosis not present

## 2015-08-05 DIAGNOSIS — M6281 Muscle weakness (generalized): Secondary | ICD-10-CM | POA: Diagnosis not present

## 2015-08-05 DIAGNOSIS — M25512 Pain in left shoulder: Secondary | ICD-10-CM | POA: Diagnosis not present

## 2015-08-06 ENCOUNTER — Other Ambulatory Visit: Payer: Self-pay | Admitting: Physician Assistant

## 2015-08-07 DIAGNOSIS — M25512 Pain in left shoulder: Secondary | ICD-10-CM | POA: Diagnosis not present

## 2015-08-07 DIAGNOSIS — M6281 Muscle weakness (generalized): Secondary | ICD-10-CM | POA: Diagnosis not present

## 2015-08-12 ENCOUNTER — Ambulatory Visit: Payer: Medicare Other | Admitting: Emergency Medicine

## 2015-08-12 ENCOUNTER — Other Ambulatory Visit (HOSPITAL_BASED_OUTPATIENT_CLINIC_OR_DEPARTMENT_OTHER): Payer: Medicare Other

## 2015-08-12 DIAGNOSIS — D72819 Decreased white blood cell count, unspecified: Secondary | ICD-10-CM

## 2015-08-12 DIAGNOSIS — C187 Malignant neoplasm of sigmoid colon: Secondary | ICD-10-CM

## 2015-08-12 DIAGNOSIS — D709 Neutropenia, unspecified: Secondary | ICD-10-CM

## 2015-08-12 DIAGNOSIS — D649 Anemia, unspecified: Secondary | ICD-10-CM

## 2015-08-12 DIAGNOSIS — Z862 Personal history of diseases of the blood and blood-forming organs and certain disorders involving the immune mechanism: Secondary | ICD-10-CM

## 2015-08-12 DIAGNOSIS — D708 Other neutropenia: Secondary | ICD-10-CM

## 2015-08-12 LAB — CBC WITH DIFFERENTIAL/PLATELET
BASO%: 0.4 % (ref 0.0–2.0)
Basophils Absolute: 0 10*3/uL (ref 0.0–0.1)
EOS ABS: 0.1 10*3/uL (ref 0.0–0.5)
EOS%: 1.5 % (ref 0.0–7.0)
HCT: 34.2 % — ABNORMAL LOW (ref 38.4–49.9)
HGB: 10.9 g/dL — ABNORMAL LOW (ref 13.0–17.1)
LYMPH%: 26.6 % (ref 14.0–49.0)
MCH: 27.3 pg (ref 27.2–33.4)
MCHC: 31.9 g/dL — ABNORMAL LOW (ref 32.0–36.0)
MCV: 85.5 fL (ref 79.3–98.0)
MONO#: 0.3 10*3/uL (ref 0.1–0.9)
MONO%: 4.7 % (ref 0.0–14.0)
NEUT#: 4.6 10*3/uL (ref 1.5–6.5)
NEUT%: 66.8 % (ref 39.0–75.0)
Platelets: 217 10*3/uL (ref 140–400)
RBC: 4 10*6/uL — AB (ref 4.20–5.82)
RDW: 13.5 % (ref 11.0–14.6)
WBC: 6.8 10*3/uL (ref 4.0–10.3)
lymph#: 1.8 10*3/uL (ref 0.9–3.3)

## 2015-08-14 ENCOUNTER — Ambulatory Visit (INDEPENDENT_AMBULATORY_CARE_PROVIDER_SITE_OTHER): Payer: Medicare Other | Admitting: Emergency Medicine

## 2015-08-14 ENCOUNTER — Encounter: Payer: Self-pay | Admitting: Emergency Medicine

## 2015-08-14 VITALS — BP 153/74 | HR 82 | Temp 98.5°F | Resp 16 | Ht 74.5 in | Wt 220.8 lb

## 2015-08-14 DIAGNOSIS — N289 Disorder of kidney and ureter, unspecified: Secondary | ICD-10-CM | POA: Diagnosis not present

## 2015-08-14 DIAGNOSIS — K4090 Unilateral inguinal hernia, without obstruction or gangrene, not specified as recurrent: Secondary | ICD-10-CM

## 2015-08-14 DIAGNOSIS — Z23 Encounter for immunization: Secondary | ICD-10-CM | POA: Diagnosis not present

## 2015-08-14 DIAGNOSIS — M6281 Muscle weakness (generalized): Secondary | ICD-10-CM | POA: Diagnosis not present

## 2015-08-14 DIAGNOSIS — E1121 Type 2 diabetes mellitus with diabetic nephropathy: Secondary | ICD-10-CM | POA: Diagnosis not present

## 2015-08-14 DIAGNOSIS — M25512 Pain in left shoulder: Secondary | ICD-10-CM | POA: Diagnosis not present

## 2015-08-14 LAB — CBC WITH DIFFERENTIAL/PLATELET
BASOS PCT: 0 % (ref 0–1)
Basophils Absolute: 0 10*3/uL (ref 0.0–0.1)
EOS ABS: 0.1 10*3/uL (ref 0.0–0.7)
EOS PCT: 1 % (ref 0–5)
HCT: 34.3 % — ABNORMAL LOW (ref 39.0–52.0)
Hemoglobin: 10.8 g/dL — ABNORMAL LOW (ref 13.0–17.0)
LYMPHS ABS: 2.2 10*3/uL (ref 0.7–4.0)
Lymphocytes Relative: 28 % (ref 12–46)
MCH: 26.4 pg (ref 26.0–34.0)
MCHC: 31.5 g/dL (ref 30.0–36.0)
MCV: 83.9 fL (ref 78.0–100.0)
MONOS PCT: 5 % (ref 3–12)
MPV: 10.1 fL (ref 8.6–12.4)
Monocytes Absolute: 0.4 10*3/uL (ref 0.1–1.0)
NEUTROS PCT: 66 % (ref 43–77)
Neutro Abs: 5.2 10*3/uL (ref 1.7–7.7)
PLATELETS: 253 10*3/uL (ref 150–400)
RBC: 4.09 MIL/uL — ABNORMAL LOW (ref 4.22–5.81)
RDW: 13.9 % (ref 11.5–15.5)
WBC: 7.9 10*3/uL (ref 4.0–10.5)

## 2015-08-14 LAB — BASIC METABOLIC PANEL WITH GFR
BUN: 18 mg/dL (ref 7–25)
CALCIUM: 8.6 mg/dL (ref 8.6–10.3)
CO2: 26 mmol/L (ref 20–31)
CREATININE: 1.59 mg/dL — AB (ref 0.70–1.11)
Chloride: 102 mmol/L (ref 98–110)
GFR, EST AFRICAN AMERICAN: 46 mL/min — AB (ref >=60)
GFR, Est Non African American: 40 mL/min — ABNORMAL LOW (ref >=60)
GLUCOSE: 242 mg/dL — AB (ref 65–99)
Potassium: 4.9 mmol/L (ref 3.5–5.3)
Sodium: 137 mmol/L (ref 135–146)

## 2015-08-14 LAB — GLUCOSE, POCT (MANUAL RESULT ENTRY): POC GLUCOSE: 293 mg/dL — AB (ref 70–99)

## 2015-08-14 LAB — POCT GLYCOSYLATED HEMOGLOBIN (HGB A1C): Hemoglobin A1C: 7.2

## 2015-08-14 NOTE — Progress Notes (Addendum)
Patient ID: [name], male   DOB: 1932/08/06, 81 y.o.   MRN: [id number]     This chart was scribed for [name] Queen, MD by Zola Button, Medical Scribe. This patient was seen in room 29 and the patient's care was started at 2:02 PM.   Chief Complaint:  Chief Complaint  Patient presents with  . Hernia    HPI: [name] is a 81 y.o. male with a history of DM type II who reports to [facility] today complaining of a right inguinal hernia. Patient notes he only has pain when lifting.  Patient states his sugars have been controlled.  He agrees to have the flu vaccine today.  Past Medical History  Diagnosis Date  . Aortic root dilatation   . DM2 (diabetes mellitus, type 2)   . Stroke   . HLD (hyperlipidemia)   . HTN (hypertension)   . CAD (coronary artery disease)     Presumed from previously mildy abnormal myoview;  myoview 4/12: EF 73%, no ischemia  . Leukopenia   . Pneumothorax   . Rectal cancer   . Cardiomyopathy     a. echo 5/12: EF 45-50%, mild LVH, grade 2 diast dysfxn, RAE  . Colon cancer   . Chronic kidney disease     renal insuff, increased uric acid  . Seizures   . Diabetes mellitus   . BPH (benign prostatic hyperplasia)     increased PSA  . Anemia    Past Surgical History  Procedure Laterality Date  . Hip arthroplasty    . Tonsillectomy    . S/p resection of rectal cancer    . Vasectomy    . Ileostomy  12/2010  . Reversal ileostomy  03/2011  . Colon surgery    . Joint replacement     Social History   Social History  . Marital Status: Married    Spouse Name: N/A  . Number of Children: N/A  . Years of Education: N/A   Occupational History  . retired    Social History Main Topics  . Smoking status: Never Smoker   . Smokeless tobacco: Never Used  . Alcohol Use: 2.4 oz/week    4 Cans of beer per week  . Drug Use: No  . Sexual Activity: Not Asked   Other Topics Concern  . None   Social History Narrative   Family History  Problem Relation  Age of Onset  . Alzheimer's disease    . Parkinsonism Other   . Parkinsonism Father   . Stomach cancer Neg Hx   . Colon cancer Neg Hx    No Known Allergies Prior to Admission medications   Medication Sig Start Date End Date Taking? Authorizing Provider  aspirin 81 MG tablet Take 81 mg by mouth daily.     Yes Historical Provider, MD  B-D INS SYR MICROFINE .5CC/28G 28G X 1/2" 0.5 ML MISC See admin instructions. 04/09/15  Yes Historical Provider, MD  B-D INS SYR MICROFINE .5CC/28G 28G X 1/2" 0.5 ML MISC USE AS DIRECTED 08/07/15  Yes Robyn Haber, MD  donepezil (ARICEPT) 5 MG tablet Take 5 mg by mouth at bedtime as needed.   Yes Historical Provider, MD  doxazosin (CARDURA) 4 MG tablet TAKE 1 TABLET BY MOUTH AT BEDTIME. 06/20/15  Yes Chelle Jeffery, PA-C  finasteride (PROSCAR) 5 MG tablet TAKE 1 TABLET BY MOUTH EVERY DAY 07/29/15  Yes [name], MD  furosemide (LASIX) 20 MG tablet TAKE 1 TABLET BY MOUTH EVERY  DAY 01/29/15  Yes [name], MD  insulin regular (HUMULIN R) 100 units/mL injection Check 30-60 minutes prior to each meal. Give sliding-scale insulin as instructed 10/23/13  Yes [name], MD  lamoTRIgine (LAMICTAL) 100 MG tablet Take 100 mg by mouth 3 (three) times daily. 04/21/15  Yes Historical Provider, MD  metoprolol succinate (TOPROL-XL) 25 MG 24 hr tablet TAKE  1 TABLET BY MOUTH EVERY DAY. 06/20/15  Yes Mancel Bale, PA-C  mupirocin cream (BACTROBAN) 2 %  10/22/14  Yes Historical Provider, MD  nitroGLYCERIN (NITROSTAT) 0.4 MG SL tablet Place 1 tablet (0.4 mg total) under the tongue every 5 (five) minutes as needed for chest pain. 09/17/13  Yes Lelon Perla, MD  Nutritional Supplements (JUICE PLUS FIBRE PO) Take 2 tablets by mouth 2 (two) times daily.   Yes Historical Provider, MD  ONE TOUCH ULTRA TEST test strip CHECK BLOOD SUGAR 3 TIMES A DAY AS DIRECTED 03/12/13  Yes [name], MD  pravastatin (PRAVACHOL) 20 MG tablet Take 1 tablet (20 mg total) by mouth daily. 05/31/12   Yes Lelon Perla, MD  pravastatin (PRAVACHOL) 20 MG tablet TAKE 1 TABLET BY MOUTH EVERY DAY 07/29/15  Yes [name], MD  amLODipine (NORVASC) 5 MG tablet TAKE 1 TABLET BY MOUTH EVERY DAY 05/09/15   Lelon Perla, MD     ROS: The patient denies fevers, chills, night sweats, unintentional weight loss, chest pain, palpitations, wheezing, dyspnea on exertion, nausea, vomiting, dysuria, hematuria, melena, numbness, weakness, or tingling.   All other systems have been reviewed and were otherwise negative with the exception of those mentioned in the HPI and as above.    PHYSICAL EXAM: Filed Vitals:   08/14/15 1353  BP: 153/74  Pulse: 82  Temp: 98.5 F (36.9 C)  Resp: 16   Body mass index is 27.98 kg/(m^2).   General: Alert, no acute distress HEENT:  Normocephalic, atraumatic, oropharynx patent. Eye: [facility] Cardiovascular:  Regular rate and rhythm, no rubs murmurs or gallops.  No Carotid bruits, radial pulse intact. No pedal edema.  Respiratory: Few dry rales in the bases but good air exchange. Abdominal: Large orange-sized right inguinal hernia which is easily reducible. Musculoskeletal: Gait intact. No edema, tenderness Skin: No rashes. Neurologic: Facial musculature symmetric. Psychiatric: Patient acts appropriately throughout our interaction. Lymphatic: No cervical or submandibular lymphadenopathy Genitourinary/Anorectal: No acute findings     LABS: Results for orders placed or performed in visit on 25/85/27  BASIC METABOLIC PANEL WITH GFR  Result Value Ref Range   Sodium 137 135 - 146 mmol/L   Potassium 4.9 3.5 - 5.3 mmol/L   Chloride 102 98 - 110 mmol/L   CO2 26 20 - 31 mmol/L   Glucose, Bld 242 (H) 65 - 99 mg/dL   BUN 18 7 - 25 mg/dL   Creat 1.59 (H) 0.70 - 1.11 mg/dL   Calcium 8.6 8.6 - 10.3 mg/dL   GFR, Est African American 46 (L) >=60 mL/min   GFR, Est Non African American 40 (L) >=60 mL/min  CBC with Differential/Platelet  Result Value Ref Range    WBC 7.9 4.0 - 10.5 K/uL   RBC 4.09 (L) 4.22 - 5.81 MIL/uL   Hemoglobin 10.8 (L) 13.0 - 17.0 g/dL   HCT 34.3 (L) 39.0 - 52.0 %   MCV 83.9 78.0 - 100.0 fL   MCH 26.4 26.0 - 34.0 pg   MCHC 31.5 30.0 - 36.0 g/dL   RDW 13.9 11.5 -  15.5 %   Platelets 253 150 - 400 K/uL   MPV 10.1 8.6 - 12.4 fL   Neutrophils Relative % 66 43 - 77 %   Neutro Abs 5.2 1.7 - 7.7 K/uL   Lymphocytes Relative 28 12 - 46 %   Lymphs Abs 2.2 0.7 - 4.0 K/uL   Monocytes Relative 5 3 - 12 %   Monocytes Absolute 0.4 0.1 - 1.0 K/uL   Eosinophils Relative 1 0 - 5 %   Eosinophils Absolute 0.1 0.0 - 0.7 K/uL   Basophils Relative 0 0 - 1 %   Basophils Absolute 0.0 0.0 - 0.1 K/uL   Smear Review Criteria for review not met   POCT glucose (manual entry)  Result Value Ref Range   POC Glucose 293 (A) 70 - 99 mg/dl  POCT glycosylated hemoglobin (Hb A1C)  Result Value Ref Range   Hemoglobin A1C 7.2      EKG/XRAY:   Primary read interpreted by Dr. Everlene Farrier at [facility].   ASSESSMENT/PLAN: Diabetes is stable with hemoglobin A1c of 7.2. His hernia is unchanged. It is easily reducible. They were given the warning signs for worsening. His kidney function remained stable. There will be no change in his treatment. Flu shot was given today.    Gross sideeffects, risk and benefits, and alternatives of medications d/w patient. Patient is aware that all medications have potential sideeffects and we are unable to predict every sideeffect or drug-drug interaction that may occur.  [name] Queen MD 08/14/2015 2:20 PM

## 2015-08-15 DIAGNOSIS — N189 Chronic kidney disease, unspecified: Secondary | ICD-10-CM | POA: Insufficient documentation

## 2015-08-17 ENCOUNTER — Other Ambulatory Visit: Payer: Self-pay | Admitting: Physician Assistant

## 2015-08-19 DIAGNOSIS — M25512 Pain in left shoulder: Secondary | ICD-10-CM | POA: Diagnosis not present

## 2015-08-19 DIAGNOSIS — M6281 Muscle weakness (generalized): Secondary | ICD-10-CM | POA: Diagnosis not present

## 2015-08-21 ENCOUNTER — Ambulatory Visit: Payer: Self-pay | Admitting: Emergency Medicine

## 2015-08-21 DIAGNOSIS — M6281 Muscle weakness (generalized): Secondary | ICD-10-CM | POA: Diagnosis not present

## 2015-08-21 DIAGNOSIS — M25512 Pain in left shoulder: Secondary | ICD-10-CM | POA: Diagnosis not present

## 2015-08-26 DIAGNOSIS — M6281 Muscle weakness (generalized): Secondary | ICD-10-CM | POA: Diagnosis not present

## 2015-08-26 DIAGNOSIS — M25512 Pain in left shoulder: Secondary | ICD-10-CM | POA: Diagnosis not present

## 2015-08-28 DIAGNOSIS — M25512 Pain in left shoulder: Secondary | ICD-10-CM | POA: Diagnosis not present

## 2015-08-28 DIAGNOSIS — M6281 Muscle weakness (generalized): Secondary | ICD-10-CM | POA: Diagnosis not present

## 2015-09-01 ENCOUNTER — Other Ambulatory Visit: Payer: Self-pay | Admitting: Emergency Medicine

## 2015-09-02 DIAGNOSIS — M25512 Pain in left shoulder: Secondary | ICD-10-CM | POA: Diagnosis not present

## 2015-09-02 DIAGNOSIS — M6281 Muscle weakness (generalized): Secondary | ICD-10-CM | POA: Diagnosis not present

## 2015-09-03 ENCOUNTER — Other Ambulatory Visit: Payer: Self-pay | Admitting: Physician Assistant

## 2015-09-03 DIAGNOSIS — L570 Actinic keratosis: Secondary | ICD-10-CM | POA: Diagnosis not present

## 2015-09-03 DIAGNOSIS — D485 Neoplasm of uncertain behavior of skin: Secondary | ICD-10-CM | POA: Diagnosis not present

## 2015-09-04 DIAGNOSIS — M25512 Pain in left shoulder: Secondary | ICD-10-CM | POA: Diagnosis not present

## 2015-09-04 DIAGNOSIS — M6281 Muscle weakness (generalized): Secondary | ICD-10-CM | POA: Diagnosis not present

## 2015-09-09 ENCOUNTER — Other Ambulatory Visit: Payer: Self-pay | Admitting: Physician Assistant

## 2015-09-10 ENCOUNTER — Ambulatory Visit (INDEPENDENT_AMBULATORY_CARE_PROVIDER_SITE_OTHER): Payer: Medicare Other | Admitting: Podiatry

## 2015-09-10 ENCOUNTER — Encounter: Payer: Self-pay | Admitting: Podiatry

## 2015-09-10 DIAGNOSIS — M79673 Pain in unspecified foot: Secondary | ICD-10-CM

## 2015-09-10 DIAGNOSIS — E1142 Type 2 diabetes mellitus with diabetic polyneuropathy: Secondary | ICD-10-CM

## 2015-09-10 DIAGNOSIS — B351 Tinea unguium: Secondary | ICD-10-CM | POA: Diagnosis not present

## 2015-09-10 DIAGNOSIS — G629 Polyneuropathy, unspecified: Secondary | ICD-10-CM

## 2015-09-10 NOTE — Patient Instructions (Signed)
Apply topical antibiotic ointment daily to the second left toe after removing Band-Aid in 1-2 days until a scab forms Diabetes and Foot Care Diabetes may cause you to have problems because of poor blood supply (circulation) to your feet and legs. This may cause the skin on your feet to become thinner, break easier, and heal more slowly. Your skin may become dry, and the skin may peel and crack. You may also have nerve damage in your legs and feet causing decreased feeling in them. You may not notice minor injuries to your feet that could lead to infections or more serious problems. Taking care of your feet is one of the most important things you can do for yourself.  HOME CARE INSTRUCTIONS  Wear shoes at all times, even in the house. Do not go barefoot. Bare feet are easily injured.  Check your feet daily for blisters, cuts, and redness. If you cannot see the bottom of your feet, use a mirror or ask someone for help.  Wash your feet with warm water (do not use hot water) and mild soap. Then pat your feet and the areas between your toes until they are completely dry. Do not soak your feet as this can dry your skin.  Apply a moisturizing lotion or petroleum jelly (that does not contain alcohol and is unscented) to the skin on your feet and to dry, brittle toenails. Do not apply lotion between your toes.  Trim your toenails straight across. Do not dig under them or around the cuticle. File the edges of your nails with an emery board or nail file.  Do not cut corns or calluses or try to remove them with medicine.  Wear clean socks or stockings every day. Make sure they are not too tight. Do not wear knee-high stockings since they may decrease blood flow to your legs.  Wear shoes that fit properly and have enough cushioning. To break in new shoes, wear them for just a few hours a day. This prevents you from injuring your feet. Always look in your shoes before you put them on to be sure there are no  objects inside.  Do not cross your legs. This may decrease the blood flow to your feet.  If you find a minor scrape, cut, or break in the skin on your feet, keep it and the skin around it clean and dry. These areas may be cleansed with mild soap and water. Do not cleanse the area with peroxide, alcohol, or iodine.  When you remove an adhesive bandage, be sure not to damage the skin around it.  If you have a wound, look at it several times a day to make sure it is healing.  Do not use heating pads or hot water bottles. They may burn your skin. If you have lost feeling in your feet or legs, you may not know it is happening until it is too late.  Make sure your health care provider performs a complete foot exam at least annually or more often if you have foot problems. Report any cuts, sores, or bruises to your health care provider immediately. SEEK MEDICAL CARE IF:   You have an injury that is not healing.  You have cuts or breaks in the skin.  You have an ingrown nail.  You notice redness on your legs or feet.  You feel burning or tingling in your legs or feet.  You have pain or cramps in your legs and feet.  Your legs or  feet are numb.  Your feet always feel cold. SEEK IMMEDIATE MEDICAL CARE IF:   There is increasing redness, swelling, or pain in or around a wound.  There is a red line that goes up your leg.  Pus is coming from a wound.  You develop a fever or as directed by your health care provider.  You notice a bad smell coming from an ulcer or wound. Document Released: 11/26/2000 Document Revised: 08/01/2013 Document Reviewed: 05/08/2013 [facility] Patient Information 2015 [place], [place]. This information is not intended to replace advice given to you by your health care provider. Make sure you discuss any questions you have with your health care provider.

## 2015-09-11 ENCOUNTER — Other Ambulatory Visit: Payer: Self-pay

## 2015-09-11 MED ORDER — MIRTAZAPINE 15 MG PO TABS: 15.0000 mg | ORAL_TABLET | Freq: Every day | ORAL | Status: DC

## 2015-09-11 NOTE — Telephone Encounter (Signed)
Pharm called and req'd RFs of remeron for pt who was at pharmacy. I OKd 1 mos at the time. Dr Everlene Farrier, you have seen pt this month and in June for check up, but don't see this med discussed. Do you want to give add'l RFs for pharm to put on file until needed?

## 2015-09-11 NOTE — Progress Notes (Signed)
Patient ID: [name], male   DOB: 06/09/1932, 81 y.o.   MRN: [id number]  Subjective: This patient presents for scheduled visit requesting debridement of thickened toenails on right and left feet  Objective: No open skin lesions noted bilaterally The toenails are elongated, hypertrophic, discolored, incurvated 6-10  Assessment: Mycotic toenails 6-10 History of diabetic peripheral neuropathy  Plan: Debrided toenails 10 and mechanically and electrically. Slight bleeding distal second left toe treated with topical antibiotic ointment and Band-Aid. Patient advised to remove Band-Aid 1-2 days and continue applying topical antibiotic ointment daily until a scab forms  Reappoint 3 months

## 2015-09-16 ENCOUNTER — Encounter: Payer: Self-pay | Admitting: Emergency Medicine

## 2015-10-01 ENCOUNTER — Other Ambulatory Visit: Payer: Self-pay | Admitting: Physician Assistant

## 2015-10-02 ENCOUNTER — Other Ambulatory Visit: Payer: Self-pay | Admitting: Physician Assistant

## 2015-10-02 DIAGNOSIS — D0462 Carcinoma in situ of skin of left upper limb, including shoulder: Secondary | ICD-10-CM | POA: Diagnosis not present

## 2015-10-02 DIAGNOSIS — C44622 Squamous cell carcinoma of skin of right upper limb, including shoulder: Secondary | ICD-10-CM | POA: Diagnosis not present

## 2015-10-02 DIAGNOSIS — C44621 Squamous cell carcinoma of skin of unspecified upper limb, including shoulder: Secondary | ICD-10-CM | POA: Diagnosis not present

## 2015-10-02 DIAGNOSIS — D0460 Carcinoma in situ of skin of unspecified upper limb, including shoulder: Secondary | ICD-10-CM | POA: Diagnosis not present

## 2015-10-02 DIAGNOSIS — L570 Actinic keratosis: Secondary | ICD-10-CM | POA: Diagnosis not present

## 2015-10-02 DIAGNOSIS — D485 Neoplasm of uncertain behavior of skin: Secondary | ICD-10-CM | POA: Diagnosis not present

## 2015-10-02 DIAGNOSIS — L905 Scar conditions and fibrosis of skin: Secondary | ICD-10-CM | POA: Diagnosis not present

## 2015-10-13 ENCOUNTER — Other Ambulatory Visit: Payer: Self-pay | Admitting: Physician Assistant

## 2015-10-13 ENCOUNTER — Ambulatory Visit (HOSPITAL_BASED_OUTPATIENT_CLINIC_OR_DEPARTMENT_OTHER): Payer: Medicare Other | Admitting: Hematology

## 2015-10-13 ENCOUNTER — Other Ambulatory Visit (HOSPITAL_BASED_OUTPATIENT_CLINIC_OR_DEPARTMENT_OTHER): Payer: Medicare Other

## 2015-10-13 ENCOUNTER — Encounter: Payer: Self-pay | Admitting: Hematology

## 2015-10-13 ENCOUNTER — Telehealth: Payer: Self-pay | Admitting: Hematology

## 2015-10-13 VITALS — BP 160/65 | HR 65 | Temp 97.5°F | Resp 18 | Ht 74.5 in | Wt 218.5 lb

## 2015-10-13 DIAGNOSIS — I2510 Atherosclerotic heart disease of native coronary artery without angina pectoris: Secondary | ICD-10-CM

## 2015-10-13 DIAGNOSIS — C187 Malignant neoplasm of sigmoid colon: Secondary | ICD-10-CM

## 2015-10-13 DIAGNOSIS — D72819 Decreased white blood cell count, unspecified: Secondary | ICD-10-CM

## 2015-10-13 DIAGNOSIS — D638 Anemia in other chronic diseases classified elsewhere: Secondary | ICD-10-CM

## 2015-10-13 DIAGNOSIS — E119 Type 2 diabetes mellitus without complications: Secondary | ICD-10-CM

## 2015-10-13 DIAGNOSIS — I10 Essential (primary) hypertension: Secondary | ICD-10-CM

## 2015-10-13 DIAGNOSIS — Z85038 Personal history of other malignant neoplasm of large intestine: Secondary | ICD-10-CM

## 2015-10-13 DIAGNOSIS — D708 Other neutropenia: Secondary | ICD-10-CM | POA: Diagnosis not present

## 2015-10-13 DIAGNOSIS — Z862 Personal history of diseases of the blood and blood-forming organs and certain disorders involving the immune mechanism: Secondary | ICD-10-CM

## 2015-10-13 DIAGNOSIS — D649 Anemia, unspecified: Secondary | ICD-10-CM

## 2015-10-13 DIAGNOSIS — N183 Chronic kidney disease, stage 3 unspecified: Secondary | ICD-10-CM

## 2015-10-13 DIAGNOSIS — D709 Neutropenia, unspecified: Secondary | ICD-10-CM

## 2015-10-13 DIAGNOSIS — F0390 Unspecified dementia without behavioral disturbance: Secondary | ICD-10-CM

## 2015-10-13 LAB — CBC WITH DIFFERENTIAL/PLATELET
BASO%: 0.6 % (ref 0.0–2.0)
Basophils Absolute: 0 10*3/uL (ref 0.0–0.1)
EOS%: 1.3 % (ref 0.0–7.0)
Eosinophils Absolute: 0.1 10*3/uL (ref 0.0–0.5)
HEMATOCRIT: 35.1 % — AB (ref 38.4–49.9)
HGB: 11.4 g/dL — ABNORMAL LOW (ref 13.0–17.1)
LYMPH#: 2.5 10*3/uL (ref 0.9–3.3)
LYMPH%: 32.9 % (ref 14.0–49.0)
MCH: 26.2 pg — AB (ref 27.2–33.4)
MCHC: 32.4 g/dL (ref 32.0–36.0)
MCV: 80.9 fL (ref 79.3–98.0)
MONO#: 0.4 10*3/uL (ref 0.1–0.9)
MONO%: 5.4 % (ref 0.0–14.0)
NEUT%: 59.8 % (ref 39.0–75.0)
NEUTROS ABS: 4.5 10*3/uL (ref 1.5–6.5)
PLATELETS: 261 10*3/uL (ref 140–400)
RBC: 4.34 10*6/uL (ref 4.20–5.82)
RDW: 15.6 % — ABNORMAL HIGH (ref 11.0–14.6)
WBC: 7.6 10*3/uL (ref 4.0–10.3)

## 2015-10-13 NOTE — Telephone Encounter (Signed)
per pof to sch pt appt-gave pt copy of avs °

## 2015-10-13 NOTE — Progress Notes (Signed)
[place] OFFICE PROGRESS NOTE  [name] Reichmann, MD [place] [address]  DIAGNOSIS: Neutropenia   CURRENT THERAPY:  1. Neupogen 300 mcg subcu,  started in late December 2011, he was on 3-4 times weekly initially, and reduced to once weekly (Tuesday) since 12/13/2014, stopped on 03/19/2015 2. Cyclosporine 125 mg twice daily. Cyclosporine was started in August 2012, and stopped on 05/14/2015   INTERVAL HISTORY: [name] 81 y.o. male with a history of autoimmune neutropenia is here for follow-up.  He is doing well overall, no fever, chill, episodes of infection lately. His daughter reports his sugar has been high, in the 200 range in the morning. He will see his primary care physician Dr. Everlene [name] soon.   MEDICAL HISTORY: Past Medical History  Diagnosis Date  . Aortic root dilatation ([place])   . DM2 (diabetes mellitus, type 2) ([place])   . Stroke ([place])   . HLD (hyperlipidemia)   . HTN (hypertension)   . CAD (coronary artery disease)     Presumed from previously mildy abnormal myoview;  myoview 4/12: EF 73%, no ischemia  . Leukopenia   . Pneumothorax   . Rectal cancer ([place])   . Cardiomyopathy     a. echo 5/12: EF 45-50%, mild LVH, grade 2 diast dysfxn, RAE  . Colon cancer ([place])   . Chronic kidney disease     renal insuff, increased uric acid  . Seizures ([place])   . Diabetes mellitus   . BPH (benign prostatic hyperplasia)     increased PSA  . Anemia      ALLERGIES:  has No Known Allergies.  MEDICATIONS: has a current medication list which includes the following prescription(s): amlodipine, aspirin, b-d ins syr microfine .5cc/28g, b-d ins syr microfine .5cc/28g, donepezil, doxazosin, finasteride, furosemide, insulin regular, lamotrigine, metoprolol succinate, mirtazapine, mupirocin cream, nitroglycerin, nutritional supplements, one touch ultra test, and pravastatin.  SURGICAL HISTORY:  Past Surgical History  Procedure Laterality Date  . Hip  arthroplasty    . Tonsillectomy    . S/p resection of rectal cancer    . Vasectomy    . Ileostomy  12/2010  . Reversal ileostomy  03/2011  . Colon surgery    . Joint replacement      REVIEW OF SYSTEMS:   Constitutional: Denies fevers, chills or abnormal weight loss Eyes: Denies blurriness of vision Ears, nose, mouth, throat, and face: Denies mucositis or sore throat Respiratory: Denies cough, dyspnea or wheezes Cardiovascular: Denies palpitation, chest discomfort or lower extremity swelling Gastrointestinal:  Denies nausea, heartburn or change in bowel habits Skin: Denies abnormal skin rashes Lymphatics: Denies new lymphadenopathy or easy bruising Neurological:Denies numbness, tingling or new weaknesses Behavioral/Psych: Mood is stable, no new changes  All other systems were reviewed with the patient and are negative.  PHYSICAL EXAMINATION: ECOG PERFORMANCE STATUS: 1  Blood pressure 160/65, pulse 65, temperature 97.5 F (36.4 C), temperature source Oral, resp. rate 18, height 6' 2.5" (1.892 m), weight 218 lb 8 oz (99.111 kg), SpO2 98 %.  GENERAL:alert, no distress and comfortable; chronically ill appearing.  SKIN: skin color, texture, turgor are normal, no rashes;  Chronic scaling of skin.  EYES: normal, Conjunctiva are pink and non-injected, sclera clear OROPHARYNX:no exudate, no erythema and lips, buccal mucosa, and tongue normal  NECK: supple, thyroid normal size, non-tender, without nodularity LYMPH:  no palpable lymphadenopathy in the cervical, axillary or supraclavicular LUNGS: clear to auscultation and percussion with normal breathing effort HEART: regular rate & rhythm  and no murmurs and trace lower extremity edema bilaterally. ABDOMEN:abdomen soft, non-tender and normal bowel sounds Musculoskeletal:no cyanosis of digits and no clubbing; R arm with decreased ROM.  NEURO: alert & oriented x 3 with fluent speech, no focal motor/sensory deficits  LABORATORY DATA: CBC  Latest Ref Rng 10/13/2015 08/14/2015 08/12/2015  WBC 4.0 - 10.3 10e3/uL 7.6 7.9 6.8  Hemoglobin 13.0 - 17.1 g/dL 11.4(L) 10.8(L) 10.9(L)  Hematocrit 38.4 - 49.9 % 35.1(L) 34.3(L) 34.2(L)  Platelets 140 - 400 10e3/uL 261 253 217    CMP Latest Ref Rng 08/14/2015 05/20/2015 04/02/2015  Glucose 65 - 99 mg/dL 242(H) 230(H) 192(H)  BUN 7 - 25 mg/dL 18 26(H) 27.0(H)  Creatinine 0.70 - 1.11 mg/dL 1.59(H) 2.00(H) 1.9(H)  Sodium 135 - 146 mmol/L 137 136 139  Potassium 3.5 - 5.3 mmol/L 4.9 4.9 5.3(H)  Chloride 98 - 110 mmol/L 102 100 -  CO2 20 - 31 mmol/L 26 24 24   Calcium 8.6 - 10.3 mg/dL 8.6 8.7 8.6  Total Protein 6.4 - 8.3 g/dL - - 6.7  Total Bilirubin 0.20 - 1.20 mg/dL - - 0.43  Alkaline Phos 40 - 150 U/L - - 115  AST 5 - 34 U/L - - 13  ALT 0 - 55 U/L - - 11    RADIOGRAPHIC STUDIES: No results found.  ASSESSMENT: [name] 81 y.o. male with a history of  1. Autoimmune neutropenia. - He continues to do well overall.  His WBC and ANC has remained normal since he is off Neupogen and cyclosporine. -Continue monitoring.  2. 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 Verl [name] 12/22/2011 without significant finding.  -continue follow up, CEA normal in 03/2015 -No evidence of recurrence, will repeat CEA on next visit   4. Anemia.  - Likely related to mild renal insufficiency. Stable    5. CKD stage III, dementia, HTN, DM, CAD  -Follow up with primary care physician   Follow-up.  -CBC in 3 month, I will see him back in 6 months with lab   All questions were answered. The patient knows to call the clinic with any problems, questions or concerns. We can certainly see the patient much sooner if necessary.  I spent 10 minutes counseling the patient face to face. The total time spent in the appointment was 15 minutes.    Truitt Merle, MD 10/13/2015  11:19 AMy

## 2015-10-18 ENCOUNTER — Other Ambulatory Visit: Payer: Self-pay | Admitting: Emergency Medicine

## 2015-10-23 DIAGNOSIS — G301 Alzheimer's disease with late onset: Secondary | ICD-10-CM | POA: Diagnosis not present

## 2015-10-23 DIAGNOSIS — G40309 Generalized idiopathic epilepsy and epileptic syndromes, not intractable, without status epilepticus: Secondary | ICD-10-CM | POA: Diagnosis not present

## 2015-10-24 ENCOUNTER — Ambulatory Visit (INDEPENDENT_AMBULATORY_CARE_PROVIDER_SITE_OTHER): Payer: Medicare Other | Admitting: Emergency Medicine

## 2015-10-24 ENCOUNTER — Encounter: Payer: Self-pay | Admitting: Emergency Medicine

## 2015-10-24 VITALS — BP 132/72 | HR 74 | Temp 97.4°F | Resp 16 | Ht 74.0 in | Wt 217.0 lb

## 2015-10-24 DIAGNOSIS — E1122 Type 2 diabetes mellitus with diabetic chronic kidney disease: Secondary | ICD-10-CM | POA: Diagnosis not present

## 2015-10-24 DIAGNOSIS — N183 Chronic kidney disease, stage 3 (moderate): Secondary | ICD-10-CM | POA: Diagnosis not present

## 2015-10-24 LAB — HEMOGLOBIN A1C: Hgb A1c MFr Bld: 7.4 % — AB (ref 4.0–6.0)

## 2015-10-24 LAB — POCT GLYCOSYLATED HEMOGLOBIN (HGB A1C): Hemoglobin A1C: 7.4

## 2015-10-24 NOTE — Progress Notes (Addendum)
This chart was scribed for [name], MD by Moises Blood, Medical Scribe. This patient was seen in Room 21 and the patient's care was started 12:07 PM.  Chief Complaint:  Chief Complaint  Patient presents with  . inguinal hernia    HPI: [name] is a 82 y.o. male who reports to [facility] today complaining of inguinal hernia.  He wants to discuss about inguinal hernia surgery. He's discussed with the surgeon previously, and the surgeon told the patient "cut out the hernia and let him go home that same day."   His sugar was 190 this morning. He talked to his oncology last week and believe that his sugar went down due to cyclosporine.   He was brought in by his daughter.   Past Medical History  Diagnosis Date  . Aortic root dilatation ([place])   . DM2 (diabetes mellitus, type 2) ([place])   . Stroke ([place])   . HLD (hyperlipidemia)   . HTN (hypertension)   . CAD (coronary artery disease)     Presumed from previously mildy abnormal myoview;  myoview 4/12: EF 73%, no ischemia  . Leukopenia   . Pneumothorax   . Rectal cancer ([place])   . Cardiomyopathy     a. echo 5/12: EF 45-50%, mild LVH, grade 2 diast dysfxn, RAE  . Colon cancer ([place])   . Chronic kidney disease     renal insuff, increased uric acid  . Seizures ([place])   . Diabetes mellitus   . BPH (benign prostatic hyperplasia)     increased PSA  . Anemia    Past Surgical History  Procedure Laterality Date  . Hip arthroplasty    . Tonsillectomy    . S/p resection of rectal cancer    . Vasectomy    . Ileostomy  12/2010  . Reversal ileostomy  03/2011  . Colon surgery    . Joint replacement     Social History   Social History  . Marital Status: Married    Spouse Name: N/A  . Number of Children: N/A  . Years of Education: N/A   Occupational History  . retired    Social History Main Topics  . Smoking status: Never Smoker   . Smokeless tobacco: Never Used  . Alcohol Use: 2.4 oz/week    4 Cans of beer per week  .  Drug Use: No  . Sexual Activity: Not Asked   Other Topics Concern  . None   Social History Narrative   Family History  Problem Relation Age of Onset  . Alzheimer's disease    . Parkinsonism Other   . Parkinsonism Father   . Stomach cancer Neg Hx   . Colon cancer Neg Hx    No Known Allergies Prior to Admission medications   Medication Sig Start Date End Date Taking? Authorizing Provider  amLODipine (NORVASC) 5 MG tablet TAKE 1 TABLET BY MOUTH EVERY DAY 05/09/15   Lelon Perla, MD  aspirin 81 MG tablet Take 81 mg by mouth daily.      Historical Provider, MD  B-D INS SYR MICROFINE .5CC/28G 28G X 1/2" 0.5 ML MISC See admin instructions. 04/09/15   Historical Provider, MD  B-D INS SYR MICROFINE .5CC/28G 28G X 1/2" 0.5 ML MISC USE AS DIRECTED 08/07/15   Robyn Haber, MD  donepezil (ARICEPT) 5 MG tablet Take 5 mg by mouth at bedtime as needed.    Historical Provider, MD  doxazosin (CARDURA) 4 MG tablet TAKE 1 TABLET BY  MOUTH AT BEDTIME. 06/20/15   Chelle Jeffery, PA-C  finasteride (PROSCAR) 5 MG tablet TAKE 1 TABLET BY MOUTH EVERY DAY 07/29/15   [name], MD  furosemide (LASIX) 20 MG tablet TAKE 1 TABLET BY MOUTH EVERY DAY 01/29/15   [name], MD  glucose blood (ONE TOUCH ULTRA TEST) test strip USE TO CHECK BLOOD SUGAR 3 TIMES A DAY AS DIRECTED 10/19/15   [name], MD  insulin regular (HUMULIN R) 100 units/mL injection Check 30-60 minutes prior to each meal. Give sliding-scale insulin as instructed 10/23/13   [name], MD  lamoTRIgine (LAMICTAL) 100 MG tablet Take 100 mg by mouth 3 (three) times daily. 04/21/15   Historical Provider, MD  metoprolol succinate (TOPROL-XL) 25 MG 24 hr tablet TAKE 1 TABLET BY MOUTH EVERY DAY. 08/19/15   Chelle Jeffery, PA-C  mirtazapine (REMERON) 15 MG tablet Take 1 tablet (15 mg total) by mouth at bedtime. 09/11/15   [name], MD  mupirocin cream (BACTROBAN) 2 %  10/22/14   Historical Provider, MD  nitroGLYCERIN (NITROSTAT) 0.4 MG SL tablet  Place 1 tablet (0.4 mg total) under the tongue every 5 (five) minutes as needed for chest pain. 09/17/13   Lelon Perla, MD  Nutritional Supplements (JUICE PLUS FIBRE PO) Take 2 tablets by mouth 2 (two) times daily.    Historical Provider, MD  pravastatin (PRAVACHOL) 20 MG tablet TAKE 1 TABLET BY MOUTH EVERY DAY 09/02/15   Chelle Jeffery, PA-C     ROS:  Constitutional: negative for chills, fever, night sweats, weight changes, or fatigue  HEENT: negative for vision changes, hearing loss, congestion, rhinorrhea, ST, epistaxis, or sinus pressure Cardiovascular: negative for chest pain or palpitations Respiratory: negative for hemoptysis, wheezing, shortness of breath, or cough Abdominal: negative for abdominal pain, nausea, vomiting, diarrhea, or constipation Dermatological: negative for rash Neurologic: negative for headache, dizziness, or syncope Musc: positive for hip pain (left), gait problem All other systems reviewed and are otherwise negative with the exception to those above and in the HPI.  PHYSICAL EXAM: Filed Vitals:   10/24/15 1148  BP: 132/72  Pulse: 74  Temp: 97.4 F (36.3 C)  Resp: 16   Body mass index is 27.85 kg/(m^2).   General: Alert, no acute distress, slight febrile HEENT:  Normocephalic, atraumatic, oropharynx patent. Eye: [facility] Cardiovascular:  Regular rate and rhythm, no rubs murmurs or gallops.  No Carotid bruits, radial pulse intact. No pedal edema.  Respiratory: Clear to auscultation bilaterally.  No wheezes, rales, or rhonchi.  No cyanosis, no use of accessory musculature Abdominal: No organomegaly, abdomen is soft and non-tender, positive bowel sounds. large orange size reducible right inguinal hernia Musculoskeletal: Gait intact. No edema, tenderness Skin: No rashes. Neurologic: Facial musculature symmetric. Psychiatric: Patient acts appropriately throughout our interaction.  Lymphatic: No cervical or submandibular  lymphadenopathy Genitourinary/Anorectal: No acute findings  LABS:  Results for orders placed or performed in visit on 10/24/15  POCT glycosylated hemoglobin (Hb A1C)  Result Value Ref Range   Hemoglobin A1C 7.4     EKG/XRAY:   Primary read interpreted by Dr. Everlene Farrier at [facility].   ASSESSMENT/PLAN:  I did increase his sliding scale insulin by the stricture 2 units. Recheck in 2 months. He has had his flu shot. I discouraged him from having his hernia surgery.I personally performed the services described in this documentation, which was scribed in my presence. The recorded information has been reviewed and is accurate.  By signing my name below,  I, Moises Blood, attest that this documentation has been prepared under the direction and in the presence of [name], MD. Electronically Signed: Moises Blood, [place]. 10/24/2015 , 12:07 PM .    Gross sideeffects, risk and benefits, and alternatives of medications d/w patient. Patient is aware that all medications have potential sideeffects and we are unable to predict every sideeffect or drug-drug interaction that may occur.  [name] MD 10/24/2015 12:07 PM

## 2015-10-29 ENCOUNTER — Other Ambulatory Visit: Payer: Self-pay | Admitting: Physician Assistant

## 2015-10-29 DIAGNOSIS — L570 Actinic keratosis: Secondary | ICD-10-CM | POA: Diagnosis not present

## 2015-10-29 DIAGNOSIS — D044 Carcinoma in situ of skin of scalp and neck: Secondary | ICD-10-CM | POA: Diagnosis not present

## 2015-11-13 ENCOUNTER — Encounter: Payer: Self-pay | Admitting: Family Medicine

## 2015-11-14 ENCOUNTER — Other Ambulatory Visit: Payer: Self-pay | Admitting: Cardiology

## 2015-11-14 NOTE — Telephone Encounter (Signed)
Rx request sent to pharmacy.  

## 2015-11-15 ENCOUNTER — Encounter (HOSPITAL_COMMUNITY): Payer: Self-pay | Admitting: Emergency Medicine

## 2015-11-15 ENCOUNTER — Inpatient Hospital Stay (HOSPITAL_COMMUNITY)
Admission: EM | Admit: 2015-11-15 | Discharge: 2015-11-19 | DRG: 481 | Disposition: A | Discharge status: Skilled Nursing Facility | Payer: Medicare Other | Attending: Internal Medicine | Admitting: Internal Medicine

## 2015-11-15 ENCOUNTER — Emergency Department (HOSPITAL_COMMUNITY): Payer: Medicare Other

## 2015-11-15 DIAGNOSIS — E1122 Type 2 diabetes mellitus with diabetic chronic kidney disease: Secondary | ICD-10-CM | POA: Diagnosis present

## 2015-11-15 DIAGNOSIS — I429 Cardiomyopathy, unspecified: Secondary | ICD-10-CM | POA: Diagnosis present

## 2015-11-15 DIAGNOSIS — E119 Type 2 diabetes mellitus without complications: Secondary | ICD-10-CM | POA: Insufficient documentation

## 2015-11-15 DIAGNOSIS — D638 Anemia in other chronic diseases classified elsewhere: Secondary | ICD-10-CM | POA: Diagnosis not present

## 2015-11-15 DIAGNOSIS — N179 Acute kidney failure, unspecified: Secondary | ICD-10-CM | POA: Diagnosis not present

## 2015-11-15 DIAGNOSIS — I10 Essential (primary) hypertension: Secondary | ICD-10-CM | POA: Diagnosis present

## 2015-11-15 DIAGNOSIS — D62 Acute posthemorrhagic anemia: Secondary | ICD-10-CM | POA: Diagnosis not present

## 2015-11-15 DIAGNOSIS — Z85048 Personal history of other malignant neoplasm of rectum, rectosigmoid junction, and anus: Secondary | ICD-10-CM

## 2015-11-15 DIAGNOSIS — I2510 Atherosclerotic heart disease of native coronary artery without angina pectoris: Secondary | ICD-10-CM | POA: Diagnosis not present

## 2015-11-15 DIAGNOSIS — Z794 Long term (current) use of insulin: Secondary | ICD-10-CM | POA: Diagnosis not present

## 2015-11-15 DIAGNOSIS — R569 Unspecified convulsions: Secondary | ICD-10-CM | POA: Diagnosis not present

## 2015-11-15 DIAGNOSIS — Z96642 Presence of left artificial hip joint: Secondary | ICD-10-CM | POA: Diagnosis not present

## 2015-11-15 DIAGNOSIS — Z79899 Other long term (current) drug therapy: Secondary | ICD-10-CM

## 2015-11-15 DIAGNOSIS — S72141A Displaced intertrochanteric fracture of right femur, initial encounter for closed fracture: Secondary | ICD-10-CM | POA: Diagnosis not present

## 2015-11-15 DIAGNOSIS — S72001A Fracture of unspecified part of neck of right femur, initial encounter for closed fracture: Secondary | ICD-10-CM | POA: Diagnosis present

## 2015-11-15 DIAGNOSIS — Z9889 Other specified postprocedural states: Secondary | ICD-10-CM

## 2015-11-15 DIAGNOSIS — Z8673 Personal history of transient ischemic attack (TIA), and cerebral infarction without residual deficits: Secondary | ICD-10-CM | POA: Diagnosis not present

## 2015-11-15 DIAGNOSIS — E785 Hyperlipidemia, unspecified: Secondary | ICD-10-CM | POA: Diagnosis not present

## 2015-11-15 DIAGNOSIS — W010XXA Fall on same level from slipping, tripping and stumbling without subsequent striking against object, initial encounter: Secondary | ICD-10-CM | POA: Diagnosis present

## 2015-11-15 DIAGNOSIS — Z09 Encounter for follow-up examination after completed treatment for conditions other than malignant neoplasm: Secondary | ICD-10-CM

## 2015-11-15 DIAGNOSIS — I129 Hypertensive chronic kidney disease with stage 1 through stage 4 chronic kidney disease, or unspecified chronic kidney disease: Secondary | ICD-10-CM | POA: Diagnosis present

## 2015-11-15 DIAGNOSIS — E875 Hyperkalemia: Secondary | ICD-10-CM | POA: Diagnosis not present

## 2015-11-15 DIAGNOSIS — Y92009 Unspecified place in unspecified non-institutional (private) residence as the place of occurrence of the external cause: Secondary | ICD-10-CM

## 2015-11-15 DIAGNOSIS — Z7982 Long term (current) use of aspirin: Secondary | ICD-10-CM

## 2015-11-15 DIAGNOSIS — M25551 Pain in right hip: Secondary | ICD-10-CM | POA: Diagnosis not present

## 2015-11-15 DIAGNOSIS — Z01810 Encounter for preprocedural cardiovascular examination: Secondary | ICD-10-CM

## 2015-11-15 DIAGNOSIS — N183 Chronic kidney disease, stage 3 (moderate): Secondary | ICD-10-CM | POA: Diagnosis not present

## 2015-11-15 DIAGNOSIS — S72001S Fracture of unspecified part of neck of right femur, sequela: Secondary | ICD-10-CM | POA: Diagnosis not present

## 2015-11-15 DIAGNOSIS — N400 Enlarged prostate without lower urinary tract symptoms: Secondary | ICD-10-CM | POA: Diagnosis present

## 2015-11-15 DIAGNOSIS — T148 Other injury of unspecified body region: Secondary | ICD-10-CM | POA: Diagnosis not present

## 2015-11-15 MED ORDER — MORPHINE SULFATE (PF) 4 MG/ML IV SOLN
4.0000 mg | Freq: Once | INTRAVENOUS | Status: AC
  Administered 2015-11-16: 4 mg via INTRAVENOUS
  Filled 2015-11-15: qty 1

## 2015-11-15 NOTE — ED Notes (Signed)
Bed: WA22 Expected date:  Expected time:  Means of arrival:  Comments: EMS 

## 2015-11-15 NOTE — ED Notes (Signed)
Pt BIB EMS. Pt was bending over to pick up his dog and fell. Pt c/o pain to R hip. Pt unable to straighten R leg. Pt has tenderness to head of R femur per EMS. Pt was given total of 194mcg of Fentanyl per EMS. Pt a/o x 4. Pt states he fell on concrete. Pt has small lac on R pinkie finger. Bleeding controlled. Denies taking blood thinners. No acute distress.

## 2015-11-15 NOTE — ED Provider Notes (Signed)
CSN: [id number]     Arrival date & time 11/15/15  2223 History   First MD Initiated Contact with Patient 11/15/15 2252     Chief Complaint  Patient presents with  . Fall  . Hip Pain   HPI  [name] is an 81 year old male with PMHx of HTN, CAD, CVA, DM and CKD presenting after a fall with right hip pain. Patient reports he was bending over to pick up his dog when he lost his balance and fell forwards onto his right hip. He states that the pain is constant and he is unable to straighten out his right leg due to pain. The pain is localized over the right lateral hip. He has not taken anything at home for the pain. He also notes that he cut his right fifth digit in the fall. He has applied a Band-Aid which has stopped bleeding. He is not taking blood thinners. Denies fevers, chills, dizziness, syncope, abdominal pain, nausea, vomiting or numbness or loss of sensation of the right extremity.   Past Medical History  Diagnosis Date  . Aortic root dilatation ([place])   . DM2 (diabetes mellitus, type 2) ([place])   . Stroke ([place])   . HLD (hyperlipidemia)   . HTN (hypertension)   . CAD (coronary artery disease)     Presumed from previously mildy abnormal myoview;  myoview 4/12: EF 73%, no ischemia  . Leukopenia   . Pneumothorax   . Rectal cancer ([place])   . Cardiomyopathy     a. echo 5/12: EF 45-50%, mild LVH, grade 2 diast dysfxn, RAE  . Colon cancer ([place])   . Chronic kidney disease     renal insuff, increased uric acid  . Seizures ([place])   . Diabetes mellitus   . BPH (benign prostatic hyperplasia)     increased PSA  . Anemia    Past Surgical History  Procedure Laterality Date  . Hip arthroplasty    . Tonsillectomy    . S/p resection of rectal cancer    . Vasectomy    . Ileostomy  12/2010  . Reversal ileostomy  03/2011  . Colon surgery    . Joint replacement     Family History  Problem Relation Age of Onset  . Alzheimer's disease    . Parkinsonism Other   . Parkinsonism Father   .  Stomach cancer Neg Hx   . Colon cancer Neg Hx    Social History  Substance Use Topics  . Smoking status: Never Smoker   . Smokeless tobacco: Never Used  . Alcohol Use: 2.4 oz/week    4 Cans of beer per week    Review of Systems  Musculoskeletal: Positive for arthralgias.  Skin: Positive for wound.  All other systems reviewed and are negative.     Allergies  Review of patient's allergies indicates no known allergies.  Home Medications   Prior to Admission medications   Medication Sig Start Date End Date Taking? Authorizing Provider  amLODipine (NORVASC) 5 MG tablet TAKE 1 TABLET BY MOUTH EVERY DAY 11/14/15  Yes Lelon Perla, MD  aspirin 81 MG tablet Take 81 mg by mouth daily.     Yes Historical Provider, MD  donepezil (ARICEPT) 10 MG tablet Take 10 mg by mouth daily. 09/18/15  Yes Historical Provider, MD  doxazosin (CARDURA) 4 MG tablet TAKE 1 TABLET BY MOUTH AT BEDTIME. 06/20/15  Yes Chelle Jeffery, PA-C  ECHINACEA HERB PO Take 1 tablet by mouth daily.  Yes Historical Provider, MD  finasteride (PROSCAR) 5 MG tablet TAKE 1 TABLET BY MOUTH EVERY DAY 07/29/15  Yes [name], MD  furosemide (LASIX) 20 MG tablet TAKE 1 TABLET BY MOUTH EVERY DAY 01/29/15  Yes [name], MD  insulin regular (HUMULIN R) 100 units/mL injection Check 30-60 minutes prior to each meal. Give sliding-scale insulin as instructed 10/23/13  Yes [name], MD  lamoTRIgine (LAMICTAL) 100 MG tablet Take 100 mg by mouth 3 (three) times daily. 04/21/15  Yes Historical Provider, MD  metoprolol succinate (TOPROL-XL) 25 MG 24 hr tablet TAKE 1 TABLET BY MOUTH EVERY DAY. 08/19/15  Yes Chelle Jeffery, PA-C  nitroGLYCERIN (NITROSTAT) 0.4 MG SL tablet Place 1 tablet (0.4 mg total) under the tongue every 5 (five) minutes as needed for chest pain. 09/17/13  Yes Lelon Perla, MD  Nutritional Supplements (JUICE PLUS FIBRE PO) Take 2 tablets by mouth 2 (two) times daily.   Yes Historical Provider, MD  pravastatin  (PRAVACHOL) 20 MG tablet TAKE 1 TABLET BY MOUTH EVERY DAY 09/02/15  Yes Chelle Jeffery, PA-C  B-D INS SYR MICROFINE .5CC/28G 28G X 1/2" 0.5 ML MISC See admin instructions. 04/09/15   Historical Provider, MD  B-D INS SYR MICROFINE .5CC/28G 28G X 1/2" 0.5 ML MISC USE AS DIRECTED 08/07/15   Robyn Haber, MD  glucose blood (ONE TOUCH ULTRA TEST) test strip USE TO CHECK BLOOD SUGAR 3 TIMES A DAY AS DIRECTED 10/19/15   [name], MD   BP 162/73 mmHg  Pulse 86  Temp(Src) 97.5 F (36.4 C) (Oral)  Resp 21  SpO2 95% Physical Exam  Constitutional: He appears well-developed and well-nourished. No distress.  HENT:  Head: Normocephalic and atraumatic.  Eyes: Conjunctivae are normal. Right eye exhibits no discharge. Left eye exhibits no discharge. No scleral icterus.  Neck: Normal range of motion.  Cardiovascular: Normal rate, regular rhythm and intact distal pulses.   Pedal pulses palpable  Pulmonary/Chest: Effort normal and breath sounds normal. No respiratory distress.  Musculoskeletal: Normal range of motion.  Right hip held in a flexed position and patient refuses to move it secondary to pain. Tenderness over the right lateral femoral head. No obvious edema. Retains FROM of the right ankle and toes. Moves left lower extremity without pain.  Neurological: He is alert. Coordination normal.  Pt refuses strength testing due to pain. Sensation to light touch intact throughout  Skin: Skin is warm and dry.  Small, superficial skin tear to the distal tip of right fifth digit. Measures approximately 1 cm with bleeding under control with Band-Aid. Wound is not amenable to repair.  Psychiatric: He has a normal mood and affect. His behavior is normal.  Nursing note and vitals reviewed.   ED Course  Procedures (including critical care time) Labs Review Labs Reviewed  CBC WITH DIFFERENTIAL/PLATELET - Abnormal; Notable for the following:    RBC 3.97 (*)    Hemoglobin 10.5 (*)    HCT 33.6 (*)     Neutro Abs 8.2 (*)    All other components within normal limits  BASIC METABOLIC PANEL - Abnormal; Notable for the following:    Glucose, Bld 177 (*)    Creatinine, Ser 1.43 (*)    Calcium 8.4 (*)    GFR calc non Af Amer 44 (*)    GFR calc Af Amer 51 (*)    All other components within normal limits  URINALYSIS, ROUTINE W REFLEX MICROSCOPIC (NOT AT [facility])    Imaging Review  Dg Hip Unilat With Pelvis 2-3 Views Right  11/15/2015  CLINICAL DATA:  Right lateral hip pain after fall. EXAM: DG HIP (WITH OR WITHOUT PELVIS) 2-3V RIGHT COMPARISON:  None. FINDINGS: There is an intertrochanteric right hip fracture. There is no dislocation. There is no bone lesion to suggest a pathologic basis for the fracture. IMPRESSION: Intertrochanteric right hip fracture. Electronically Signed   By: Andreas Newport M.D.   On: 11/15/2015 23:17   I have personally reviewed and evaluated these images and lab results as part of my medical decision-making.   EKG Interpretation None      MDM   Final diagnoses:  Closed right hip fracture, initial encounter [facility])   81 year old male presenting with right hip fracture. Right lower extremity is neurovascularly intact. Pain controlled in emergency department with morphine and fentanyl. Discussed case with on-call orthopedic doctor, Dr. Doran Durand, who recommends nothing by mouth at midnight, no addition of blood thinners and transfer to [facility] for surgery in the morning. Discussed patient with hospitalist Dr. Olevia Bowens who will admit the patient.    Josephina Gip, PA-C 11/16/15 0116  Lacretia Leigh, MD 11/22/15 [phone]

## 2015-11-16 ENCOUNTER — Inpatient Hospital Stay (HOSPITAL_COMMUNITY): Payer: Medicare Other

## 2015-11-16 ENCOUNTER — Encounter (HOSPITAL_COMMUNITY): Payer: Self-pay | Admitting: Internal Medicine

## 2015-11-16 ENCOUNTER — Inpatient Hospital Stay (HOSPITAL_COMMUNITY): Payer: Medicare Other | Admitting: Anesthesiology

## 2015-11-16 ENCOUNTER — Encounter (HOSPITAL_COMMUNITY)
Admission: EM | Disposition: A | Discharge status: Skilled Nursing Facility | Payer: Self-pay | Source: Home / Self Care | Attending: Internal Medicine

## 2015-11-16 DIAGNOSIS — S72001S Fracture of unspecified part of neck of right femur, sequela: Secondary | ICD-10-CM | POA: Diagnosis not present

## 2015-11-16 DIAGNOSIS — S72141A Displaced intertrochanteric fracture of right femur, initial encounter for closed fracture: Secondary | ICD-10-CM | POA: Diagnosis not present

## 2015-11-16 DIAGNOSIS — Z4789 Encounter for other orthopedic aftercare: Secondary | ICD-10-CM | POA: Diagnosis not present

## 2015-11-16 DIAGNOSIS — M6281 Muscle weakness (generalized): Secondary | ICD-10-CM | POA: Diagnosis not present

## 2015-11-16 DIAGNOSIS — W010XXA Fall on same level from slipping, tripping and stumbling without subsequent striking against object, initial encounter: Secondary | ICD-10-CM | POA: Diagnosis present

## 2015-11-16 DIAGNOSIS — R569 Unspecified convulsions: Secondary | ICD-10-CM | POA: Diagnosis not present

## 2015-11-16 DIAGNOSIS — N179 Acute kidney failure, unspecified: Secondary | ICD-10-CM | POA: Diagnosis not present

## 2015-11-16 DIAGNOSIS — Y92009 Unspecified place in unspecified non-institutional (private) residence as the place of occurrence of the external cause: Secondary | ICD-10-CM | POA: Diagnosis not present

## 2015-11-16 DIAGNOSIS — Z96642 Presence of left artificial hip joint: Secondary | ICD-10-CM | POA: Diagnosis present

## 2015-11-16 DIAGNOSIS — W548XXA Other contact with dog, initial encounter: Secondary | ICD-10-CM | POA: Diagnosis not present

## 2015-11-16 DIAGNOSIS — Z794 Long term (current) use of insulin: Secondary | ICD-10-CM | POA: Diagnosis not present

## 2015-11-16 DIAGNOSIS — N400 Enlarged prostate without lower urinary tract symptoms: Secondary | ICD-10-CM | POA: Diagnosis not present

## 2015-11-16 DIAGNOSIS — Z85048 Personal history of other malignant neoplasm of rectum, rectosigmoid junction, and anus: Secondary | ICD-10-CM | POA: Diagnosis not present

## 2015-11-16 DIAGNOSIS — D638 Anemia in other chronic diseases classified elsewhere: Secondary | ICD-10-CM | POA: Diagnosis not present

## 2015-11-16 DIAGNOSIS — I2510 Atherosclerotic heart disease of native coronary artery without angina pectoris: Secondary | ICD-10-CM | POA: Diagnosis not present

## 2015-11-16 DIAGNOSIS — E785 Hyperlipidemia, unspecified: Secondary | ICD-10-CM | POA: Diagnosis not present

## 2015-11-16 DIAGNOSIS — D62 Acute posthemorrhagic anemia: Secondary | ICD-10-CM | POA: Diagnosis not present

## 2015-11-16 DIAGNOSIS — I10 Essential (primary) hypertension: Secondary | ICD-10-CM | POA: Diagnosis not present

## 2015-11-16 DIAGNOSIS — M25551 Pain in right hip: Secondary | ICD-10-CM | POA: Diagnosis not present

## 2015-11-16 DIAGNOSIS — M21251 Flexion deformity, right hip: Secondary | ICD-10-CM | POA: Diagnosis not present

## 2015-11-16 DIAGNOSIS — N183 Chronic kidney disease, stage 3 (moderate): Secondary | ICD-10-CM | POA: Diagnosis not present

## 2015-11-16 DIAGNOSIS — J9811 Atelectasis: Secondary | ICD-10-CM | POA: Diagnosis not present

## 2015-11-16 DIAGNOSIS — R278 Other lack of coordination: Secondary | ICD-10-CM | POA: Diagnosis not present

## 2015-11-16 DIAGNOSIS — E1122 Type 2 diabetes mellitus with diabetic chronic kidney disease: Secondary | ICD-10-CM | POA: Diagnosis not present

## 2015-11-16 DIAGNOSIS — E119 Type 2 diabetes mellitus without complications: Secondary | ICD-10-CM

## 2015-11-16 DIAGNOSIS — N189 Chronic kidney disease, unspecified: Secondary | ICD-10-CM | POA: Diagnosis not present

## 2015-11-16 DIAGNOSIS — I429 Cardiomyopathy, unspecified: Secondary | ICD-10-CM | POA: Diagnosis not present

## 2015-11-16 DIAGNOSIS — D500 Iron deficiency anemia secondary to blood loss (chronic): Secondary | ICD-10-CM | POA: Diagnosis not present

## 2015-11-16 DIAGNOSIS — Z01810 Encounter for preprocedural cardiovascular examination: Secondary | ICD-10-CM | POA: Diagnosis not present

## 2015-11-16 DIAGNOSIS — I129 Hypertensive chronic kidney disease with stage 1 through stage 4 chronic kidney disease, or unspecified chronic kidney disease: Secondary | ICD-10-CM | POA: Diagnosis present

## 2015-11-16 DIAGNOSIS — Z9889 Other specified postprocedural states: Secondary | ICD-10-CM | POA: Diagnosis not present

## 2015-11-16 DIAGNOSIS — E875 Hyperkalemia: Secondary | ICD-10-CM | POA: Diagnosis not present

## 2015-11-16 DIAGNOSIS — Z8673 Personal history of transient ischemic attack (TIA), and cerebral infarction without residual deficits: Secondary | ICD-10-CM | POA: Diagnosis not present

## 2015-11-16 DIAGNOSIS — S72001A Fracture of unspecified part of neck of right femur, initial encounter for closed fracture: Secondary | ICD-10-CM | POA: Diagnosis present

## 2015-11-16 DIAGNOSIS — Z7982 Long term (current) use of aspirin: Secondary | ICD-10-CM | POA: Diagnosis not present

## 2015-11-16 DIAGNOSIS — R489 Unspecified symbolic dysfunctions: Secondary | ICD-10-CM | POA: Diagnosis not present

## 2015-11-16 DIAGNOSIS — Z79899 Other long term (current) drug therapy: Secondary | ICD-10-CM | POA: Diagnosis not present

## 2015-11-16 DIAGNOSIS — S72141D Displaced intertrochanteric fracture of right femur, subsequent encounter for closed fracture with routine healing: Secondary | ICD-10-CM | POA: Diagnosis not present

## 2015-11-16 HISTORY — PX: FEMUR IM NAIL: SHX1597

## 2015-11-16 LAB — CBC WITH DIFFERENTIAL/PLATELET
BASOS ABS: 0 10*3/uL (ref 0.0–0.1)
Basophils Relative: 0 %
Eosinophils Absolute: 0 10*3/uL (ref 0.0–0.7)
Eosinophils Relative: 0 %
HEMATOCRIT: 33.6 % — AB (ref 39.0–52.0)
Hemoglobin: 10.5 g/dL — ABNORMAL LOW (ref 13.0–17.0)
LYMPHS PCT: 14 %
Lymphs Abs: 1.4 10*3/uL (ref 0.7–4.0)
MCH: 26.4 pg (ref 26.0–34.0)
MCHC: 31.3 g/dL (ref 30.0–36.0)
MCV: 84.6 fL (ref 78.0–100.0)
Monocytes Absolute: 0.4 10*3/uL (ref 0.1–1.0)
Monocytes Relative: 4 %
NEUTROS ABS: 8.2 10*3/uL — AB (ref 1.7–7.7)
NEUTROS PCT: 82 %
Platelets: 216 10*3/uL (ref 150–400)
RBC: 3.97 MIL/uL — AB (ref 4.22–5.81)
RDW: 14.7 % (ref 11.5–15.5)
WBC: 10.1 10*3/uL (ref 4.0–10.5)

## 2015-11-16 LAB — URINALYSIS, ROUTINE W REFLEX MICROSCOPIC
BILIRUBIN URINE: NEGATIVE
GLUCOSE, UA: NEGATIVE mg/dL
Hgb urine dipstick: NEGATIVE
KETONES UR: NEGATIVE mg/dL
LEUKOCYTES UA: NEGATIVE
NITRITE: NEGATIVE
PH: 6.5 (ref 5.0–8.0)
PROTEIN: NEGATIVE mg/dL
Specific Gravity, Urine: 1.015 (ref 1.005–1.030)

## 2015-11-16 LAB — POCT I-STAT 4, (NA,K, GLUC, HGB,HCT)
GLUCOSE: 119 mg/dL — AB (ref 65–99)
HEMATOCRIT: 33 % — AB (ref 39.0–52.0)
HEMOGLOBIN: 11.2 g/dL — AB (ref 13.0–17.0)
Potassium: 4.7 mmol/L (ref 3.5–5.1)
SODIUM: 140 mmol/L (ref 135–145)

## 2015-11-16 LAB — BASIC METABOLIC PANEL
ANION GAP: 6 (ref 5–15)
BUN: 18 mg/dL (ref 6–20)
CALCIUM: 8.4 mg/dL — AB (ref 8.9–10.3)
CO2: 28 mmol/L (ref 22–32)
CREATININE: 1.43 mg/dL — AB (ref 0.61–1.24)
Chloride: 102 mmol/L (ref 101–111)
GFR calc Af Amer: 51 mL/min — ABNORMAL LOW (ref >60)
GFR, EST NON AFRICAN AMERICAN: 44 mL/min — AB (ref >60)
GLUCOSE: 177 mg/dL — AB (ref 65–99)
POTASSIUM: 4.6 mmol/L (ref 3.5–5.1)
Sodium: 136 mmol/L (ref 135–145)

## 2015-11-16 LAB — GLUCOSE, CAPILLARY
GLUCOSE-CAPILLARY: 161 mg/dL — AB (ref 65–99)
GLUCOSE-CAPILLARY: 151 mg/dL — AB (ref 65–99)
Glucose-Capillary: 136 mg/dL — ABNORMAL HIGH (ref 65–99)
Glucose-Capillary: 182 mg/dL — ABNORMAL HIGH (ref 65–99)
Glucose-Capillary: 160 mg/dL — ABNORMAL HIGH (ref 65–99)

## 2015-11-16 SURGERY — INSERTION, INTRAMEDULLARY ROD, FEMUR, RETROGRADE
Anesthesia: Monitor Anesthesia Care | Site: Hip | Laterality: Right

## 2015-11-16 MED ORDER — SODIUM CHLORIDE 0.9 % IJ SOLN
3.0000 mL | Freq: Two times a day (BID) | INTRAMUSCULAR | Status: DC
  Administered 2015-11-16 – 2015-11-19 (×4): 3 mL via INTRAVENOUS

## 2015-11-16 MED ORDER — ONDANSETRON HCL 4 MG PO TABS: 4.0000 mg | ORAL_TABLET | Freq: Four times a day (QID) | ORAL | Status: DC | PRN

## 2015-11-16 MED ORDER — ACETAMINOPHEN 650 MG RE SUPP: 650.0000 mg | Freq: Four times a day (QID) | RECTAL | Status: DC | PRN

## 2015-11-16 MED ORDER — DOXAZOSIN MESYLATE 4 MG PO TABS
4.0000 mg | ORAL_TABLET | Freq: Every day | ORAL | Status: DC
  Administered 2015-11-17 – 2015-11-19 (×3): 4 mg via ORAL
  Filled 2015-11-16 (×5): qty 1

## 2015-11-16 MED ORDER — METOPROLOL SUCCINATE ER 25 MG PO TB24
25.0000 mg | ORAL_TABLET | Freq: Every day | ORAL | Status: DC
  Administered 2015-11-17 – 2015-11-19 (×3): 25 mg via ORAL
  Filled 2015-11-16 (×3): qty 1

## 2015-11-16 MED ORDER — ONDANSETRON HCL 4 MG/2ML IJ SOLN
INTRAMUSCULAR | Status: DC | PRN
  Administered 2015-11-16: 4 mg via INTRAVENOUS

## 2015-11-16 MED ORDER — PRAVASTATIN SODIUM 20 MG PO TABS
20.0000 mg | ORAL_TABLET | Freq: Every day | ORAL | Status: DC
  Administered 2015-11-16 – 2015-11-18 (×3): 20 mg via ORAL
  Filled 2015-11-16 (×3): qty 1

## 2015-11-16 MED ORDER — ONDANSETRON HCL 4 MG/2ML IJ SOLN
INTRAMUSCULAR | Status: AC
  Filled 2015-11-16: qty 2

## 2015-11-16 MED ORDER — PROPOFOL 10 MG/ML IV BOLUS
INTRAVENOUS | Status: DC | PRN
Start: 2015-11-16 — End: 2015-11-16
  Administered 2015-11-16 (×2): 10 mg via INTRAVENOUS
  Administered 2015-11-16: 20 mg via INTRAVENOUS
  Administered 2015-11-16: 10 mg via INTRAVENOUS

## 2015-11-16 MED ORDER — LIDOCAINE HCL (CARDIAC) 20 MG/ML IV SOLN
INTRAVENOUS | Status: DC | PRN
  Administered 2015-11-16: 40 mg via INTRAVENOUS

## 2015-11-16 MED ORDER — SODIUM CHLORIDE 0.9 % IV SOLN
INTRAVENOUS | Status: DC | PRN
  Administered 2015-11-16 (×4): via INTRAVENOUS

## 2015-11-16 MED ORDER — METOCLOPRAMIDE HCL 5 MG/ML IJ SOLN: 5.0000 mg | Freq: Three times a day (TID) | INTRAMUSCULAR | Status: DC | PRN

## 2015-11-16 MED ORDER — CEFAZOLIN SODIUM-DEXTROSE 2-3 GM-% IV SOLR
INTRAVENOUS | Status: AC
  Administered 2015-11-16: 2 g via INTRAVENOUS
  Filled 2015-11-16: qty 50

## 2015-11-16 MED ORDER — SENNA 8.6 MG PO TABS
1.0000 | ORAL_TABLET | Freq: Two times a day (BID) | ORAL | Status: DC
  Administered 2015-11-16 – 2015-11-19 (×6): 8.6 mg via ORAL
  Filled 2015-11-16 (×5): qty 1

## 2015-11-16 MED ORDER — HYDROCODONE-ACETAMINOPHEN 5-325 MG PO TABS
1.0000 | ORAL_TABLET | Freq: Four times a day (QID) | ORAL | Status: DC | PRN
  Administered 2015-11-16 – 2015-11-18 (×8): 2 via ORAL
  Administered 2015-11-19: 1 via ORAL
  Administered 2015-11-19: 2 via ORAL
  Filled 2015-11-16 (×7): qty 2
  Filled 2015-11-16: qty 1
  Filled 2015-11-16 (×2): qty 2

## 2015-11-16 MED ORDER — NITROGLYCERIN 0.4 MG SL SUBL: 0.4000 mg | SUBLINGUAL_TABLET | SUBLINGUAL | Status: DC | PRN

## 2015-11-16 MED ORDER — FENTANYL CITRATE (PF) 250 MCG/5ML IJ SOLN
INTRAMUSCULAR | Status: AC
  Filled 2015-11-16: qty 5

## 2015-11-16 MED ORDER — MORPHINE SULFATE (PF) 4 MG/ML IV SOLN
4.0000 mg | Freq: Once | INTRAVENOUS | Status: AC
  Administered 2015-11-16: 4 mg via INTRAVENOUS
  Filled 2015-11-16: qty 1

## 2015-11-16 MED ORDER — SODIUM CHLORIDE 0.9 % IV SOLN
INTRAVENOUS | Status: DC
  Administered 2015-11-16: 75 mL/h via INTRAVENOUS
  Administered 2015-11-18: 04:00:00 via INTRAVENOUS

## 2015-11-16 MED ORDER — METHOCARBAMOL 1000 MG/10ML IJ SOLN
500.0000 mg | Freq: Four times a day (QID) | INTRAVENOUS | Status: DC | PRN
  Administered 2015-11-16: 500 mg via INTRAVENOUS
  Filled 2015-11-16 (×2): qty 5

## 2015-11-16 MED ORDER — ONDANSETRON HCL 4 MG/2ML IJ SOLN: 4.0000 mg | Freq: Four times a day (QID) | INTRAMUSCULAR | Status: DC | PRN

## 2015-11-16 MED ORDER — FUROSEMIDE 20 MG PO TABS: 20.0000 mg | ORAL_TABLET | Freq: Every day | ORAL | Status: DC

## 2015-11-16 MED ORDER — ASPIRIN EC 325 MG PO TBEC
325.0000 mg | DELAYED_RELEASE_TABLET | Freq: Two times a day (BID) | ORAL | Status: DC
  Administered 2015-11-16 – 2015-11-19 (×6): 325 mg via ORAL
  Filled 2015-11-16 (×6): qty 1

## 2015-11-16 MED ORDER — AMLODIPINE BESYLATE 5 MG PO TABS
5.0000 mg | ORAL_TABLET | Freq: Every day | ORAL | Status: DC
  Administered 2015-11-17 – 2015-11-19 (×3): 5 mg via ORAL
  Filled 2015-11-16 (×3): qty 1

## 2015-11-16 MED ORDER — PROPOFOL 10 MG/ML IV BOLUS
INTRAVENOUS | Status: AC
  Filled 2015-11-16: qty 20

## 2015-11-16 MED ORDER — HYDROCODONE-ACETAMINOPHEN 5-325 MG PO TABS
1.0000 | ORAL_TABLET | ORAL | Status: DC | PRN
  Administered 2015-11-16: 1 via ORAL
  Filled 2015-11-16: qty 1

## 2015-11-16 MED ORDER — ONDANSETRON HCL 4 MG/2ML IJ SOLN: 4.0000 mg | Freq: Once | INTRAMUSCULAR | Status: DC | PRN

## 2015-11-16 MED ORDER — PANTOPRAZOLE SODIUM 40 MG PO TBEC
40.0000 mg | DELAYED_RELEASE_TABLET | Freq: Every day | ORAL | Status: DC
  Administered 2015-11-17 – 2015-11-19 (×3): 40 mg via ORAL
  Filled 2015-11-16 (×3): qty 1

## 2015-11-16 MED ORDER — FENTANYL CITRATE (PF) 100 MCG/2ML IJ SOLN: 25.0000 ug | INTRAMUSCULAR | Status: DC | PRN

## 2015-11-16 MED ORDER — 0.9 % SODIUM CHLORIDE (POUR BTL) OPTIME
TOPICAL | Status: DC | PRN
  Administered 2015-11-16: 1000 mL

## 2015-11-16 MED ORDER — LIDOCAINE HCL (CARDIAC) 20 MG/ML IV SOLN
INTRAVENOUS | Status: AC
Start: 2015-11-16 — End: 2015-11-16
  Filled 2015-11-16: qty 5

## 2015-11-16 MED ORDER — ASPIRIN 81 MG PO CHEW: 81.0000 mg | CHEWABLE_TABLET | Freq: Every day | ORAL | Status: DC

## 2015-11-16 MED ORDER — HYDROMORPHONE HCL 1 MG/ML IJ SOLN
0.5000 mg | INTRAMUSCULAR | Status: DC | PRN
  Administered 2015-11-16: 0.5 mg via INTRAVENOUS
  Filled 2015-11-16: qty 1

## 2015-11-16 MED ORDER — OXYCODONE HCL 5 MG PO TABS
5.0000 mg | ORAL_TABLET | Freq: Once | ORAL | Status: DC | PRN
Start: 2015-11-16 — End: 2015-11-16

## 2015-11-16 MED ORDER — LAMOTRIGINE 100 MG PO TABS
100.0000 mg | ORAL_TABLET | Freq: Three times a day (TID) | ORAL | Status: DC
  Administered 2015-11-16 – 2015-11-19 (×9): 100 mg via ORAL
  Filled 2015-11-16 (×9): qty 1

## 2015-11-16 MED ORDER — ACETAMINOPHEN 325 MG PO TABS
650.0000 mg | ORAL_TABLET | Freq: Four times a day (QID) | ORAL | Status: DC | PRN
  Administered 2015-11-18: 650 mg via ORAL
  Filled 2015-11-16: qty 2

## 2015-11-16 MED ORDER — DONEPEZIL HCL 10 MG PO TABS
10.0000 mg | ORAL_TABLET | Freq: Every day | ORAL | Status: DC
  Administered 2015-11-17 – 2015-11-19 (×3): 10 mg via ORAL
  Filled 2015-11-16 (×3): qty 1

## 2015-11-16 MED ORDER — PHENOL 1.4 % MT LIQD: 1.0000 | OROMUCOSAL | Status: DC | PRN

## 2015-11-16 MED ORDER — FENTANYL CITRATE (PF) 250 MCG/5ML IJ SOLN
INTRAMUSCULAR | Status: DC | PRN
  Administered 2015-11-16: 50 ug via INTRAVENOUS

## 2015-11-16 MED ORDER — MENTHOL 3 MG MT LOZG: 1.0000 | LOZENGE | OROMUCOSAL | Status: DC | PRN

## 2015-11-16 MED ORDER — FINASTERIDE 5 MG PO TABS
5.0000 mg | ORAL_TABLET | Freq: Every day | ORAL | Status: DC
  Administered 2015-11-17 – 2015-11-19 (×3): 5 mg via ORAL
  Filled 2015-11-16 (×3): qty 1

## 2015-11-16 MED ORDER — KETOROLAC TROMETHAMINE 30 MG/ML IJ SOLN
30.0000 mg | Freq: Once | INTRAMUSCULAR | Status: AC
  Administered 2015-11-16: 30 mg via INTRAVENOUS
  Filled 2015-11-16: qty 1

## 2015-11-16 MED ORDER — OXYCODONE HCL 5 MG/5ML PO SOLN: 5.0000 mg | Freq: Once | ORAL | Status: DC | PRN

## 2015-11-16 MED ORDER — ROCURONIUM BROMIDE 50 MG/5ML IV SOLN
INTRAVENOUS | Status: AC
  Filled 2015-11-16: qty 1

## 2015-11-16 MED ORDER — DOCUSATE SODIUM 100 MG PO CAPS
100.0000 mg | ORAL_CAPSULE | Freq: Two times a day (BID) | ORAL | Status: DC
  Administered 2015-11-16 – 2015-11-19 (×6): 100 mg via ORAL
  Filled 2015-11-16 (×6): qty 1

## 2015-11-16 MED ORDER — ONDANSETRON HCL 4 MG/2ML IJ SOLN
4.0000 mg | Freq: Once | INTRAMUSCULAR | Status: AC
  Administered 2015-11-16: 4 mg via INTRAVENOUS
  Filled 2015-11-16: qty 2

## 2015-11-16 MED ORDER — CEFAZOLIN SODIUM-DEXTROSE 2-3 GM-% IV SOLR
2.0000 g | Freq: Four times a day (QID) | INTRAVENOUS | Status: AC
  Administered 2015-11-16 (×2): 2 g via INTRAVENOUS
  Filled 2015-11-16 (×2): qty 50

## 2015-11-16 MED ORDER — INSULIN ASPART 100 UNIT/ML ~~LOC~~ SOLN
0.0000 [IU] | Freq: Three times a day (TID) | SUBCUTANEOUS | Status: DC
  Administered 2015-11-16 – 2015-11-17 (×4): 4 [IU] via SUBCUTANEOUS
  Administered 2015-11-17: 3 [IU] via SUBCUTANEOUS
  Administered 2015-11-18 – 2015-11-19 (×4): 4 [IU] via SUBCUTANEOUS

## 2015-11-16 MED ORDER — MORPHINE SULFATE (PF) 2 MG/ML IV SOLN
0.5000 mg | INTRAVENOUS | Status: DC | PRN
  Administered 2015-11-16: 0.5 mg via INTRAVENOUS
  Filled 2015-11-16: qty 1

## 2015-11-16 MED ORDER — METHOCARBAMOL 500 MG PO TABS
500.0000 mg | ORAL_TABLET | Freq: Four times a day (QID) | ORAL | Status: DC | PRN
  Administered 2015-11-17 – 2015-11-18 (×2): 500 mg via ORAL
  Filled 2015-11-16 (×2): qty 1

## 2015-11-16 MED ORDER — SODIUM CHLORIDE 0.9 % IV SOLN: INTRAVENOUS | Status: DC

## 2015-11-16 MED ORDER — METOCLOPRAMIDE HCL 5 MG PO TABS: 5.0000 mg | ORAL_TABLET | Freq: Three times a day (TID) | ORAL | Status: DC | PRN

## 2015-11-16 SURGICAL SUPPLY — 49 items
ADH SKN CLS APL DERMABOND .7 (GAUZE/BANDAGES/DRESSINGS) ×2
ALCOHOL ISOPROPYL (RUBBING) (MISCELLANEOUS) ×3 IMPLANT
BIT DRILL CALIBRATED 4.2 (BIT) IMPLANT
BLADE HELICAL TFN ADV 110 (BLADE) ×1 IMPLANT
BLADE HELICAL TFN ADV 110MM (BLADE) ×1
BNDG COHESIVE 4X5 TAN STRL (GAUZE/BANDAGES/DRESSINGS) ×2 IMPLANT
CHLORAPREP W/TINT 26ML (MISCELLANEOUS) ×3 IMPLANT
COVER PERINEAL POST (MISCELLANEOUS) ×3 IMPLANT
COVER SURGICAL LIGHT HANDLE (MISCELLANEOUS) ×3 IMPLANT
DERMABOND ADVANCED (GAUZE/BANDAGES/DRESSINGS) ×4
DERMABOND ADVANCED .7 DNX12 (GAUZE/BANDAGES/DRESSINGS) ×2 IMPLANT
DRAPE C-ARM 42X72 X-RAY (DRAPES) ×3 IMPLANT
DRAPE C-ARMOR (DRAPES) ×3 IMPLANT
DRAPE IMP U-DRAPE 54X76 (DRAPES) ×6 IMPLANT
DRAPE STERI IOBAN 125X83 (DRAPES) ×1 IMPLANT
DRAPE U-SHAPE 47X51 STRL (DRAPES) ×6 IMPLANT
DRAPE UNIVERSAL PACK (DRAPES) ×1 IMPLANT
DRILL BIT CALIBRATED 4.2 (BIT) ×3
DRSG MEPILEX BORDER 4X4 (GAUZE/BANDAGES/DRESSINGS) ×4 IMPLANT
DRSG MEPILEX BORDER 4X8 (GAUZE/BANDAGES/DRESSINGS) ×2 IMPLANT
DRSG PAD ABDOMINAL 8X10 ST (GAUZE/BANDAGES/DRESSINGS) IMPLANT
ELECT REM PT RETURN 9FT ADLT (ELECTROSURGICAL) ×3
ELECTRODE REM PT RTRN 9FT ADLT (ELECTROSURGICAL) ×1 IMPLANT
FACESHIELD WRAPAROUND (MASK) ×6 IMPLANT
FACESHIELD WRAPAROUND OR TEAM (MASK) ×1 IMPLANT
GLOVE BIOGEL PI IND STRL 8.5 (GLOVE) ×1 IMPLANT
GLOVE BIOGEL PI INDICATOR 8.5 (GLOVE) ×2
GLOVE SURG ORTHO 8.5 STRL (GLOVE) ×6 IMPLANT
GOWN STRL REUS W/ TWL LRG LVL3 (GOWN DISPOSABLE) ×2 IMPLANT
GOWN STRL REUS W/TWL 2XL LVL3 (GOWN DISPOSABLE) ×3 IMPLANT
GOWN STRL REUS W/TWL LRG LVL3 (GOWN DISPOSABLE) ×9
GUIDEWIRE 3.2X400 (WIRE) ×4 IMPLANT
KIT ROOM TURNOVER OR (KITS) ×3 IMPLANT
MANIFOLD NEPTUNE II (INSTRUMENTS) ×3 IMPLANT
MARKER SKIN DUAL TIP RULER LAB (MISCELLANEOUS) ×3 IMPLANT
NAIL CANN TFNA DEG 11X170-125 (Nail) ×2 IMPLANT
NS IRRIG 1000ML POUR BTL (IV SOLUTION) ×3 IMPLANT
PACK GENERAL/GYN (CUSTOM PROCEDURE TRAY) ×3 IMPLANT
PAD ARMBOARD 7.5X6 YLW CONV (MISCELLANEOUS) ×10 IMPLANT
PADDING CAST ABS 4INX4YD NS (CAST SUPPLIES)
PADDING CAST ABS COTTON 4X4 ST (CAST SUPPLIES) IMPLANT
SCREW LOCK STAR 5X38 (Screw) ×2 IMPLANT
SUT MNCRL AB 3-0 PS2 27 (SUTURE) ×3 IMPLANT
SUT MON AB 2-0 CT1 27 (SUTURE) ×3 IMPLANT
SUT VIC AB 1 CT1 27 (SUTURE) ×3
SUT VIC AB 1 CT1 27XBRD ANBCTR (SUTURE) ×1 IMPLANT
TFN advance helical blade 110mm (Blade) ×2 IMPLANT
TOWEL OR 17X24 6PK STRL BLUE (TOWEL DISPOSABLE) ×3 IMPLANT
TOWEL OR 17X26 10 PK STRL BLUE (TOWEL DISPOSABLE) ×3 IMPLANT

## 2015-11-16 NOTE — Consult Note (Signed)
[name] is an 81 y.o. male.    Chief Complaint: right hip pain  HPI: 81 y/o male with a ground level fall last night injuring right hip. Prior left hip replacement in 2008. Tripped over dog and fell onto right side and c/o immediate pain and inability to bear weight. Pt transferred to [facility] for surgical management of the right hip this morning. Denies any other injuries. Denies any LOC, chest pain or syncope Minimal to no pain currently in right hip  PCP:  DAUB, Lina Sayre, MD  PMH: Past Medical History  Diagnosis Date  . Aortic root dilatation ([place])   . DM2 (diabetes mellitus, type 2) ([place])   . Stroke ([place])   . HLD (hyperlipidemia)   . HTN (hypertension)   . CAD (coronary artery disease)     Presumed from previously mildy abnormal myoview;  myoview 4/12: EF 73%, no ischemia  . Leukopenia   . Pneumothorax   . Rectal cancer ([place])   . Cardiomyopathy     a. echo 5/12: EF 45-50%, mild LVH, grade 2 diast dysfxn, RAE  . Colon cancer ([place])   . Chronic kidney disease     renal insuff, increased uric acid  . Seizures ([place])   . Diabetes mellitus   . BPH (benign prostatic hyperplasia)     increased PSA  . Anemia     PSH: Past Surgical History  Procedure Laterality Date  . Hip arthroplasty    . Tonsillectomy    . S/p resection of rectal cancer    . Vasectomy    . Ileostomy  12/2010  . Reversal ileostomy  03/2011  . Colon surgery    . Joint replacement      Social History:  reports that he has never smoked. He has never used smokeless tobacco. He reports that he drinks about 2.4 oz of alcohol per week. He reports that he does not use illicit drugs.  Allergies:  No Known Allergies  Medications: Current Facility-Administered Medications  Medication Dose Route Frequency Provider Last Rate Last Dose  . 0.9 %  sodium chloride infusion   Intravenous Continuous Reubin Milan, MD      . amLODipine (NORVASC) tablet 5 mg  5 mg Oral Daily Reubin Milan, MD      .  aspirin chewable tablet 81 mg  81 mg Oral Daily Reubin Milan, MD      . donepezil (ARICEPT) tablet 10 mg  10 mg Oral Daily Reubin Milan, MD      . doxazosin (CARDURA) tablet 4 mg  4 mg Oral Daily Reubin Milan, MD      . finasteride (PROSCAR) tablet 5 mg  5 mg Oral Daily Reubin Milan, MD      . furosemide (LASIX) tablet 20 mg  20 mg Oral Daily Reubin Milan, MD      . HYDROcodone-acetaminophen (NORCO/VICODIN) 5-325 MG per tablet 1 tablet  1 tablet Oral Q4H PRN Reubin Milan, MD   1 tablet at 11/16/15 0418  . HYDROmorphone (DILAUDID) injection 0.5 mg  0.5 mg Intravenous Q2H PRN Reubin Milan, MD   0.5 mg at 11/16/15 0418  . insulin aspart (novoLOG) injection 0-20 Units  0-20 Units Subcutaneous TID WC Reubin Milan, MD   4 Units at 11/16/15 0730  . lamoTRIgine (LAMICTAL) tablet 100 mg  100 mg Oral TID Reubin Milan, MD      . metoprolol succinate (TOPROL-XL) 24 hr tablet 25 mg  25 mg Oral Daily Reubin Milan, MD      . nitroGLYCERIN (NITROSTAT) SL tablet 0.4 mg  0.4 mg Sublingual Q5 min PRN Reubin Milan, MD      . pantoprazole (PROTONIX) EC tablet 40 mg  40 mg Oral Daily Reubin Milan, MD   40 mg at 11/16/15 0345  . pravastatin (PRAVACHOL) tablet 20 mg  20 mg Oral q1800 Reubin Milan, MD      . sodium chloride 0.9 % injection 3 mL  3 mL Intravenous Q12H Reubin Milan, MD   3 mL at 11/16/15 0418    Results for orders placed or performed during the hospital encounter of 11/15/15 (from the past 48 hour(s))  CBC with Differential     Status: Abnormal   Collection Time: 11/16/15 12:08 AM  Result Value Ref Range   WBC 10.1 4.0 - 10.5 K/uL   RBC 3.97 (L) 4.22 - 5.81 MIL/uL   Hemoglobin 10.5 (L) 13.0 - 17.0 g/dL   HCT 33.6 (L) 39.0 - 52.0 %   MCV 84.6 78.0 - 100.0 fL   MCH 26.4 26.0 - 34.0 pg   MCHC 31.3 30.0 - 36.0 g/dL   RDW 14.7 11.5 - 15.5 %   Platelets 216 150 - 400 K/uL   Neutrophils Relative % 82 %   Neutro Abs 8.2  (H) 1.7 - 7.7 K/uL   Lymphocytes Relative 14 %   Lymphs Abs 1.4 0.7 - 4.0 K/uL   Monocytes Relative 4 %   Monocytes Absolute 0.4 0.1 - 1.0 K/uL   Eosinophils Relative 0 %   Eosinophils Absolute 0.0 0.0 - 0.7 K/uL   Basophils Relative 0 %   Basophils Absolute 0.0 0.0 - 0.1 K/uL  Basic metabolic panel     Status: Abnormal   Collection Time: 11/16/15 12:08 AM  Result Value Ref Range   Sodium 136 135 - 145 mmol/L   Potassium 4.6 3.5 - 5.1 mmol/L   Chloride 102 101 - 111 mmol/L   CO2 28 22 - 32 mmol/L   Glucose, Bld 177 (H) 65 - 99 mg/dL   BUN 18 6 - 20 mg/dL   Creatinine, Ser 1.43 (H) 0.61 - 1.24 mg/dL   Calcium 8.4 (L) 8.9 - 10.3 mg/dL   GFR calc non Af Amer 44 (L) >60 mL/min   GFR calc Af Amer 51 (L) >60 mL/min    Comment: (NOTE) The eGFR has been calculated using the CKD EPI equation. This calculation has not been validated in all clinical situations. eGFR's persistently <60 mL/min signify possible Chronic Kidney Disease.    Anion gap 6 5 - 15  Urinalysis, Routine w reflex microscopic (not at [facility])     Status: None   Collection Time: 11/16/15 12:13 AM  Result Value Ref Range   Color, Urine YELLOW YELLOW   APPearance CLEAR CLEAR   Specific Gravity, Urine 1.015 1.005 - 1.030   pH 6.5 5.0 - 8.0   Glucose, UA NEGATIVE NEGATIVE mg/dL   Hgb urine dipstick NEGATIVE NEGATIVE   Bilirubin Urine NEGATIVE NEGATIVE   Ketones, ur NEGATIVE NEGATIVE mg/dL   Protein, ur NEGATIVE NEGATIVE mg/dL   Nitrite NEGATIVE NEGATIVE   Leukocytes, UA NEGATIVE NEGATIVE    Comment: MICROSCOPIC NOT DONE ON URINES WITH NEGATIVE PROTEIN, BLOOD, LEUKOCYTES, NITRITE, OR GLUCOSE <1000 mg/dL.  Glucose, capillary     Status: Abnormal   Collection Time: 11/16/15  3:54 AM  Result Value Ref Range   Glucose-Capillary 161 (H) 65 -  99 mg/dL  Glucose, capillary     Status: Abnormal   Collection Time: 11/16/15  6:44 AM  Result Value Ref Range   Glucose-Capillary 151 (H) 65 - 99 mg/dL   Dg Hip Unilat With Pelvis  2-3 Views Right  11/15/2015  CLINICAL DATA:  Right lateral hip pain after fall. EXAM: DG HIP (WITH OR WITHOUT PELVIS) 2-3V RIGHT COMPARISON:  None. FINDINGS: There is an intertrochanteric right hip fracture. There is no dislocation. There is no bone lesion to suggest a pathologic basis for the fracture. IMPRESSION: Intertrochanteric right hip fracture. Electronically Signed   By: Andreas Newport M.D.   On: 11/15/2015 23:17    ROS: ROS Inability to bear weight right lower extremity Otherwise ros negative  Physical Exam: Alert and oriented 81 y/o male in no acute distress Bilateral upper extremities with full rom, no deformities  Right lower extremity with shortening and external rotation nv intact distally Left lower extremity s/p total in good position with no deformity No rashes or edema distally Physical Exam   Assessment/Plan Assessment: right intertroch femur fracture  Plan: Plan for surgical management later this morning Keep NPO Strict non weight bearing right lower extremity

## 2015-11-16 NOTE — Anesthesia Preprocedure Evaluation (Addendum)
Anesthesia Evaluation  Patient identified by MRN, date of birth, ID band Patient awake    Reviewed: Allergy & Precautions, NPO status , Patient's Chart, lab work & pertinent test results, reviewed documented beta blocker date and time   Airway Mallampati: II  TM Distance: >3 FB Neck ROM: Full    Dental  (+) Teeth Intact, Dental Advisory Given   Pulmonary    breath sounds clear to auscultation       Cardiovascular hypertension, Pt. on medications and Pt. on home beta blockers + CAD   Rhythm:Regular Rate:Normal     Neuro/Psych Seizures -, Poorly Controlled,  CVA, No Residual Symptoms    GI/Hepatic   Endo/Other  diabetes, Poorly Controlled, Insulin Dependent  Renal/GU      Musculoskeletal   Abdominal   Peds  Hematology   Anesthesia Other Findings   Reproductive/Obstetrics                            Anesthesia Physical Anesthesia Plan  ASA: III  Anesthesia Plan: MAC and Spinal   Post-op Pain Management:    Induction: Intravenous  Airway Management Planned: Natural Airway  Additional Equipment:   Intra-op Plan:   Post-operative Plan:   Informed Consent: I have reviewed the patients History and Physical, chart, labs and discussed the procedure including the risks, benefits and alternatives for the proposed anesthesia with the patient or authorized representative who has indicated his/her understanding and acceptance.   Dental advisory given  Plan Discussed with: CRNA and Anesthesiologist  Anesthesia Plan Comments: (R. Intertrochanteric hip fracture Type 2 DM glucose 144 Htn H/O CVA Seizures Renal insufficiency CR. 1.43  Plan SAB  [place])        Anesthesia Quick Evaluation

## 2015-11-16 NOTE — Progress Notes (Signed)
[facility] Progress Note   [name]  [id number]  DOB: 02-28-1932  DOA: 11/15/2015 PCP: [name], MD  Brief narrative: [name] is a 81 y.o. male with PMH of CAD, DM2, HLD, HTN and rectal cancer in the past who presents for a mechanical fall at home.   Subjective: No complaints.   Assessment/Plan: Principal Problem:   Closed right hip fracture  - per ortho  Active Problems:   DM type 2 (diabetes mellitus, type 2)  - cont SSI    Hyperlipidemia - cont pravastatin    Essential hypertension - Continue amlodipine 5 mg by mouth daily, metoprolol 25 mg by mouth daily furosemide 20 mg by mouth daily  CKD3 - stable - follow    CAD, NATIVE VESSEL - cont Aspirin and B blocker    BPH (benign prostatic hyperplasia) - cont Proscar and Cardura    Anemia of chronic disease - Follow during post op period    Code Status:     Code Status Orders        Start     Ordered   11/16/15 1243  Full code   Continuous     11/16/15 1242     Family Communication:  Disposition Plan: SNF? DVT prophylaxis: ASA per ortho Consultants:ortho Procedures: right hip nailing   Antibiotics: Anti-infectives    Start     Dose/Rate Route Frequency Ordered Stop   11/16/15 1600  ceFAZolin (ANCEF) IVPB 2 g/50 mL premix     2 g 100 mL/hr over 30 Minutes Intravenous Every 6 hours 11/16/15 1242 11/17/15 0359   11/16/15 0857  ceFAZolin (ANCEF) 2-3 GM-% IVPB SOLR    Comments:  Marinda Elk   : cabinet override      11/16/15 0857 11/16/15 1014      Objective: There were no vitals filed for this visit.  Intake/Output Summary (Last 24 hours) at 11/16/15 1412 Last data filed at 11/16/15 1126  Gross per 24 hour  Intake   2000 ml  Output    535 ml  Net   1465 ml     Vitals Filed Vitals:   11/16/15 1130 11/16/15 1145 11/16/15 1200 11/16/15 1216  BP: 109/57 116/62 108/64 131/50  Pulse: 80 75 81 75  Temp:  97.2 F (36.2 C)  97.8 F (36.6 C)  TempSrc:       Resp: 13 13 17 17   SpO2: 94% 95% 95% 92%    Exam:  General:  Pt is alert, not in acute distress  HEENT: No icterus, No thrush, oral mucosa moist  Cardiovascular: regular rate and rhythm, S1/S2 No murmur  Respiratory: clear to auscultation bilaterally   Abdomen: Soft, +Bowel sounds, non tender, non distended, no guarding  MSK: No LE edema, cyanosis or clubbing  Data Reviewed: Basic Metabolic Panel:  Recent Labs Lab 11/16/15 0008 11/16/15 1015  NA 136 140  K 4.6 4.7  CL 102  --   CO2 28  --   GLUCOSE 177* 119*  BUN 18  --   CREATININE 1.43*  --   CALCIUM 8.4*  --    Liver Function Tests: No results for input(s): AST, ALT, ALKPHOS, BILITOT, PROT, ALBUMIN in the last 168 hours. No results for input(s): LIPASE, AMYLASE in the last 168 hours. No results for input(s): AMMONIA in the last 168 hours. CBC:  Recent Labs Lab 11/16/15 0008 11/16/15 1015  WBC 10.1  --   NEUTROABS 8.2*  --   HGB 10.5* 11.2*  HCT 33.6* 33.0*  MCV 84.6  --   PLT 216  --    Cardiac Enzymes: No results for input(s): CKTOTAL, CKMB, CKMBINDEX, TROPONINI in the last 168 hours. BNP (last 3 results) No results for input(s): BNP in the last 8760 hours.  ProBNP (last 3 results) No results for input(s): PROBNP in the last 8760 hours.  CBG:  Recent Labs Lab 11/16/15 0354 11/16/15 0644 11/16/15 1125  GLUCAP 161* 151* 136*    No results found for this or any previous visit (from the past 240 hour(s)).   Studies: Chest Portable 1 View  11/16/2015  CLINICAL DATA:  Pre-op for right hip surgery today. Right hip fracture. EXAM: PORTABLE CHEST 1 VIEW COMPARISON:  Radiographs 12/03/2011.  Chest CT 05/01/2011. FINDINGS: 0630 hours. There are lower lung volumes with mildly increased bibasilar atelectasis. There is chronic elevation of the left hemidiaphragm which appears unchanged. The heart size and mediastinal contours are stable. No edema, pneumothorax or significant pleural effusion.  IMPRESSION: Mildly increased bibasilar atelectasis related to suboptimal inspiration. No other significant changes. Electronically Signed   By: Richardean Sale M.D.   On: 11/16/2015 08:55   Dg Hip Port Unilat With Pelvis 1v Right  11/16/2015  CLINICAL DATA:  Surgery follow-up. EXAM: DG HIP (WITH OR WITHOUT PELVIS) 1V PORT RIGHT COMPARISON:  Fluoroscopic intraoperative images dated 11/16/2015 and preoperative plain film of the right hip dated 11/15/2015. FINDINGS: Rod and screw fixation hardware traversing the right femoral neck fracture site appears intact and stable in alignment compared to the intraoperative fluoroscopic images. No surgical complicating features seen. Osseous alignment is stable. Left hip arthroplasty hardware also appear stable in alignment. Soft tissues about the pelvis and right hip are unremarkable. IMPRESSION: Status post internal fixation of the intertrochanteric right femur fracture. Hardware appears intact and well aligned. No surgical complicating feature. Electronically Signed   By: Franki Cabot M.D.   On: 11/16/2015 13:59   Dg Hip Operative Unilat With Pelvis Right  11/16/2015  CLINICAL DATA:  Right hip nailing for intertrochanteric fracture. EXAM: OPERATIVE RIGHT HIP (WITH PELVIS IF PERFORMED) 3 VIEWS TECHNIQUE: Fluoroscopic spot image(s) were submitted for interpretation post-operatively. COMPARISON:  Radiographs 11/15/2015. FINDINGS: Three views demonstrate fixation of the intertrochanteric right femur fracture with a short segment intramedullary nail secured by a distal interlocking screw. The hardware appears well position. There is no dislocation or displacement of the fracture. IMPRESSION: No demonstrated complication following ORIF of intertrochanteric right femur fracture. Electronically Signed   By: Richardean Sale M.D.   On: 11/16/2015 11:30   Dg Hip Unilat With Pelvis 2-3 Views Right  11/15/2015  CLINICAL DATA:  Right lateral hip pain after fall. EXAM: DG HIP (WITH  OR WITHOUT PELVIS) 2-3V RIGHT COMPARISON:  None. FINDINGS: There is an intertrochanteric right hip fracture. There is no dislocation. There is no bone lesion to suggest a pathologic basis for the fracture. IMPRESSION: Intertrochanteric right hip fracture. Electronically Signed   By: Andreas Newport M.D.   On: 11/15/2015 23:17    Scheduled Meds:  Scheduled Meds: . amLODipine  5 mg Oral Daily  . aspirin EC  325 mg Oral BID PC  .  ceFAZolin (ANCEF) IV  2 g Intravenous Q6H  . docusate sodium  100 mg Oral BID  . donepezil  10 mg Oral Daily  . doxazosin  4 mg Oral Daily  . finasteride  5 mg Oral Daily  . furosemide  20 mg Oral Daily  . insulin aspart  0-20 Units Subcutaneous  TID WC  . lamoTRIgine  100 mg Oral TID  . metoprolol succinate  25 mg Oral Daily  . pantoprazole  40 mg Oral Daily  . pravastatin  20 mg Oral q1800  . senna  1 tablet Oral BID  . sodium chloride  3 mL Intravenous Q12H   Continuous Infusions:   Time spent on care of this patient: 35 min   [place], MD 11/16/2015, 2:12 PM  LOS: 0 days   [facility] Office  [phone] Pager - Text Page per [email or web address] If 7PM-7AM, please contact night-coverage [email or web address]

## 2015-11-16 NOTE — H&P (Signed)
[facility] History and Physical  [name] [id number] DOB: 1932/10/22 DOA: 11/15/2015  Referring physician: Josephina Gip, PA-C PCP: [name], MD   Chief Complaint: Fall.  HPI: [name] is a 81 y.o. male with a past medical history of CAD, type 2 diabetes, hyperlipidemia, hypertension,chronic anemia, BPH, history of rectal cancer who comes to the emergency department due to having right hip pain after having a fall at home. Per patient, he was bending over to try to pick up one of his daughters, when he lost balance and fell on his right hip. He is states that he felt immediate pain, was unable to stand up on his own and denies having chest pain, dyspnea, palpitations, dizziness, diaphoresis or nausea prior to the fall. He is states that this was an accident.  When seen in the emergency department, the patient was in no acute distress. Pain was 5 out of 10, but improved after morphine and Toradol were given.   Review of Systems:  Constitutional:  No weight loss, night sweats, Fevers, chills, fatigue.  HEENT:  No headaches, Difficulty swallowing,Tooth/dental problems,Sore throat,  No sneezing, itching, ear ache, nasal congestion, post nasal drip,  Cardio-vascular:  No chest pain, Orthopnea, PND, swelling in lower extremities, anasarca, dizziness, palpitations  GI:  No heartburn, indigestion, abdominal pain, nausea, vomiting, diarrhea, change in bowel habits, loss of appetite  Resp:  No shortness of breath with exertion or at rest. No excess mucus, no productive cough, No non-productive cough, No coughing up of blood.No change in color of mucus.No wheezing.No chest wall deformity  Skin:  no rash or lesions.  GU:  no dysuria, change in color of urine, no urgency or frequency. No flank pain.  Musculoskeletal:  Frequent arthralgias. Occasional back pain.  Psych:  No change in mood or affect. No depression or anxiety. Occasional insomnia.   Past Medical  History  Diagnosis Date  . Aortic root dilatation ([place])   . DM2 (diabetes mellitus, type 2) ([place])   . Stroke ([place])   . HLD (hyperlipidemia)   . HTN (hypertension)   . CAD (coronary artery disease)     Presumed from previously mildy abnormal myoview;  myoview 4/12: EF 73%, no ischemia  . Leukopenia   . Pneumothorax   . Rectal cancer ([place])   . Cardiomyopathy     a. echo 5/12: EF 45-50%, mild LVH, grade 2 diast dysfxn, RAE  . Colon cancer ([place])   . Chronic kidney disease     renal insuff, increased uric acid  . Seizures ([place])   . Diabetes mellitus   . BPH (benign prostatic hyperplasia)     increased PSA  . Anemia    Past Surgical History  Procedure Laterality Date  . Hip arthroplasty    . Tonsillectomy    . S/p resection of rectal cancer    . Vasectomy    . Ileostomy  12/2010  . Reversal ileostomy  03/2011  . Colon surgery    . Joint replacement     Social History:  reports that he has never smoked. He has never used smokeless tobacco. He reports that he drinks about 2.4 oz of alcohol per week. He reports that he does not use illicit drugs.  No Known Allergies  Family History  Problem Relation Age of Onset  . Alzheimer's disease    . Parkinsonism Other   . Parkinsonism Father   . Stomach cancer Neg Hx   . Colon cancer Neg Hx  Prior to Admission medications   Medication Sig Start Date End Date Taking? Authorizing Provider  amLODipine (NORVASC) 5 MG tablet TAKE 1 TABLET BY MOUTH EVERY DAY 11/14/15  Yes Lelon Perla, MD  aspirin 81 MG tablet Take 81 mg by mouth daily.     Yes Historical Provider, MD  donepezil (ARICEPT) 10 MG tablet Take 10 mg by mouth daily. 09/18/15  Yes Historical Provider, MD  doxazosin (CARDURA) 4 MG tablet TAKE 1 TABLET BY MOUTH AT BEDTIME. 06/20/15  Yes Chelle Jeffery, PA-C  ECHINACEA HERB PO Take 1 tablet by mouth daily.   Yes Historical Provider, MD  finasteride (PROSCAR) 5 MG tablet TAKE 1 TABLET BY MOUTH EVERY DAY 07/29/15  Yes [name], MD  furosemide (LASIX) 20 MG tablet TAKE 1 TABLET BY MOUTH EVERY DAY 01/29/15  Yes [name], MD  insulin regular (HUMULIN R) 100 units/mL injection Check 30-60 minutes prior to each meal. Give sliding-scale insulin as instructed 10/23/13  Yes [name], MD  lamoTRIgine (LAMICTAL) 100 MG tablet Take 100 mg by mouth 3 (three) times daily. 04/21/15  Yes Historical Provider, MD  metoprolol succinate (TOPROL-XL) 25 MG 24 hr tablet TAKE 1 TABLET BY MOUTH EVERY DAY. 08/19/15  Yes Chelle Jeffery, PA-C  nitroGLYCERIN (NITROSTAT) 0.4 MG SL tablet Place 1 tablet (0.4 mg total) under the tongue every 5 (five) minutes as needed for chest pain. 09/17/13  Yes Lelon Perla, MD  Nutritional Supplements (JUICE PLUS FIBRE PO) Take 2 tablets by mouth 2 (two) times daily.   Yes Historical Provider, MD  pravastatin (PRAVACHOL) 20 MG tablet TAKE 1 TABLET BY MOUTH EVERY DAY 09/02/15  Yes Chelle Jeffery, PA-C  B-D INS SYR MICROFINE .5CC/28G 28G X 1/2" 0.5 ML MISC See admin instructions. 04/09/15   Historical Provider, MD  B-D INS SYR MICROFINE .5CC/28G 28G X 1/2" 0.5 ML MISC USE AS DIRECTED 08/07/15   Robyn Haber, MD  glucose blood (ONE TOUCH ULTRA TEST) test strip USE TO CHECK BLOOD SUGAR 3 TIMES A DAY AS DIRECTED 10/19/15   [name], MD   Physical Exam: Filed Vitals:   11/15/15 2224 11/15/15 2232 11/16/15 0100  BP:  162/73 148/81  Pulse:  86 82  Temp:  97.5 F (36.4 C)   TempSrc:  Oral   Resp:  21 18  SpO2: 100% 95% 96%    Wt Readings from Last 3 Encounters:  10/24/15 98.431 kg (217 lb)  10/13/15 99.111 kg (218 lb 8 oz)  08/14/15 100.154 kg (220 lb 12.8 oz)    General:  Appears calm and comfortable Eyes: PERRL, normal lids, irises & conjunctiva ENT: grossly normal hearing, lips & tongue Neck: no LAD, masses or thyromegaly Cardiovascular: RRR, no m/r/g. No LE edema. Telemetry: SR, no arrhythmias  Respiratory: CTA bilaterally, no w/r/r. Normal respiratory effort. Abdomen: soft,  ntnd Skin: no rash or induration seen on limited exam Musculoskeletal: Right hip tenderness and decreased range of motion. Decreased RLE length. Distal pulses are palpable. Psychiatric: grossly normal mood and affect, speech fluent and appropriate Neurologic: grossly non-focal.          Labs on Admission:  Basic Metabolic Panel:  Recent Labs Lab 11/16/15 0008  NA 136  K 4.6  CL 102  CO2 28  GLUCOSE 177*  BUN 18  CREATININE 1.43*  CALCIUM 8.4*   CBC:  Recent Labs Lab 11/16/15 0008  WBC 10.1  NEUTROABS 8.2*  HGB 10.5*  HCT 33.6*  MCV 84.6  PLT 216    Radiological Exams on Admission: Dg Hip Unilat With Pelvis 2-3 Views Right  11/15/2015  CLINICAL DATA:  Right lateral hip pain after fall. EXAM: DG HIP (WITH OR WITHOUT PELVIS) 2-3V RIGHT COMPARISON:  None. FINDINGS: There is an intertrochanteric right hip fracture. There is no dislocation. There is no bone lesion to suggest a pathologic basis for the fracture. IMPRESSION: Intertrochanteric right hip fracture. Electronically Signed   By: Andreas Newport M.D.   On: 11/15/2015 23:17    EKG: Independently reviewed. Ordered  Assessment/Plan Principal Problem:   Closed right hip fracture ([place]) Admit to [facility]. Continue analgesics and antiemetics as needed. Orthopedic surgery will be evaluating the patient later today.  Active Problems:   DM type 2 (diabetes mellitus, type 2) ([facility]) CBG monitoring with regular insulin sliding scale while the patient is in the hospital.    Hyperlipidemia Continue pravastatin. Monitor LFTs periodically.    Essential hypertension Continue amlodipine 5 mg by mouth daily. Continue metoprolol 25 mg by mouth daily Continue furosemide 20 mg by mouth daily. Monitor blood pressure periodically.    CAD, NATIVE VESSEL Continue beta blocker, pravastatin and resume aspirin after surgery.    BPH (benign prostatic hyperplasia) Continue finasteride 5 mg by mouth daily Continue  Cardura 4 mg by mouth daily    Anemia of chronic disease Monitor hematocrit and hemoglobin periodically.  Orthopedic surgery has been consulted by the emergency department.  Code Status: Full code. DVT Prophylaxis: Unilateral SCD.  Family Communication: His daughter was by bedside. Disposition Plan: Admit to [facility]. Continue analgesics. Orthopedic surgery has been consulted by the emergency department and will evaluate the patient there.  Time spent: Over 70 minutes were spent during the process of this admission.  Reubin Milan [facility] Pager [phone].

## 2015-11-16 NOTE — Discharge Instructions (Signed)
 Dr. Bryten Maher Adult Hip & Knee Specialist [facility] [address] [place], [place] [address] [phone]   POSTOPERATIVE DIRECTIONS    Hip Rehabilitation, Guidelines Following Surgery   WEIGHT BEARING Weight bearing as tolerated with assist device (walker, cane, etc) as directed, use it as long as suggested by your surgeon or therapist, typically at least 4-6 weeks.   HOME CARE INSTRUCTIONS  Remove items at home which could result in a fall. This includes throw rugs or furniture in walking pathways.  Continue medications as instructed at time of discharge.  You may have some home medications which will be placed on hold until you complete the course of blood thinner medication.  4 days after discharge, you may start showering. No tub baths or soaking your incisions. Do not put on socks or shoes without following the instructions of your caregivers.   Sit on chairs with arms. Use the chair arms to help push yourself up when arising.  Arrange for the use of a toilet seat elevator so you are not sitting low.   Walk with walker as instructed.  You may resume a sexual relationship in one month or when given the OK by your caregiver.  Use walker as long as suggested by your caregivers.  Avoid periods of inactivity such as sitting longer than an hour when not asleep. This helps prevent blood clots.  You may return to work once you are cleared by your surgeon.  Do not drive a car for 6 weeks or until released by your surgeon.  Do not drive while taking narcotics.  Wear elastic stockings for two weeks following surgery during the day but you may remove then at night.  Make sure you keep all of your appointments after your operation with all of your doctors and caregivers. You should call the office at the above phone number and make an appointment for approximately two weeks after the date of your surgery. Please pick up a stool softener and laxative  for home use as long as you are requiring pain medications.  ICE to the affected hip every three hours for 30 minutes at a time and then as needed for pain and swelling. Continue to use ice on the hip for pain and swelling from surgery. You may notice swelling that will progress down to the foot and ankle.  This is normal after surgery.  Elevate the leg when you are not up walking on it.   It is important for you to complete the blood thinner medication as prescribed by your doctor.  Continue to use the breathing machine which will help keep your temperature down.  It is common for your temperature to cycle up and down following surgery, especially at night when you are not up moving around and exerting yourself.  The breathing machine keeps your lungs expanded and your temperature down.  RANGE OF MOTION AND STRENGTHENING EXERCISES  These exercises are designed to help you keep full movement of your hip joint. Follow your caregiver's or physical therapist's instructions. Perform all exercises about fifteen times, three times per day or as directed. Exercise both hips, even if you have had only one joint replacement. These exercises can be done on a training (exercise) mat, on the floor, on a table or on a bed. Use whatever works the best and is most comfortable for you. Use music or television while you are exercising so that the exercises are a pleasant break in your day. This   will make your life better with the exercises acting as a break in routine you can look forward to.  Lying on your back, slowly slide your foot toward your buttocks, raising your knee up off the floor. Then slowly slide your foot back down until your leg is straight again.  Lying on your back spread your legs as far apart as you can without causing discomfort.  Lying on your side, raise your upper leg and foot straight up from the floor as far as is comfortable. Slowly lower the leg and repeat.  Lying on your back, tighten up the  muscle in the front of your thigh (quadriceps muscles). You can do this by keeping your leg straight and trying to raise your heel off the floor. This helps strengthen the largest muscle supporting your knee.  Lying on your back, tighten up the muscles of your buttocks both with the legs straight and with the knee bent at a comfortable angle while keeping your heel on the floor.   SKILLED REHAB INSTRUCTIONS: If the patient is transferred to a skilled rehab facility following release from the hospital, a list of the current medications will be sent to the facility for the patient to continue.  When discharged from the skilled rehab facility, please have the facility set up the patient's Home Health Physical Therapy prior to being released. Also, the skilled facility will be responsible for providing the patient with their medications at time of release from the facility to include their pain medication and their blood thinner medication. If the patient is still at the rehab facility at time of the two week follow up appointment, the skilled rehab facility will also need to assist the patient in arranging follow up appointment in our office and any transportation needs.  MAKE SURE YOU:  Understand these instructions.  Will watch your condition.  Will get help right away if you are not doing well or get worse.  Pick up stool softner and laxative for home use following surgery while on pain medications. Daily dry dressing changes as needed. In 4 days, you may remove your dressings and begin taking showers - no tub baths or soaking the incisions. Continue to use ice for pain and swelling after surgery. Do not use any lotions or creams on the incision until instructed by your surgeon.   

## 2015-11-16 NOTE — Transfer of Care (Signed)
Immediate Anesthesia Transfer of Care Note  Patient: [name]  Procedure(s) Performed: Procedure(s): INTRAMEDULLARY TROCHANTERIC HIP NAILING (Right)  Patient Location: PACU  Anesthesia Type:Spinal  Level of Consciousness: awake and alert   Airway & Oxygen Therapy: Patient Spontanous Breathing and Patient connected to nasal cannula oxygen  Post-op Assessment: Report given to RN and Post -op Vital signs reviewed and stable  Post vital signs: Reviewed and stable  Last Vitals:  Filed Vitals:   11/16/15 0641 11/16/15 0809  BP: 154/68 159/77  Pulse: 77 73  Temp: 36.8 C 36.7 C  Resp: 16 18    Complications: No apparent anesthesia complications

## 2015-11-16 NOTE — ED Notes (Signed)
Report given to [facility].   [name] - 5N) updated of transport status.

## 2015-11-16 NOTE — H&P (View-Only) (Signed)
[name] is an 81 y.o. male.    Chief Complaint: right hip pain  HPI: 81 y/o male with a ground level fall last night injuring right hip. Prior left hip replacement in 2008. Tripped over dog and fell onto right side and c/o immediate pain and inability to bear weight. Pt transferred to [facility] for surgical management of the right hip this morning. Denies any other injuries. Denies any LOC, chest pain or syncope Minimal to no pain currently in right hip  PCP:  DAUB, Lina Sayre, MD  PMH: Past Medical History  Diagnosis Date  . Aortic root dilatation ([place])   . DM2 (diabetes mellitus, type 2) ([place])   . Stroke ([place])   . HLD (hyperlipidemia)   . HTN (hypertension)   . CAD (coronary artery disease)     Presumed from previously mildy abnormal myoview;  myoview 4/12: EF 73%, no ischemia  . Leukopenia   . Pneumothorax   . Rectal cancer ([place])   . Cardiomyopathy     a. echo 5/12: EF 45-50%, mild LVH, grade 2 diast dysfxn, RAE  . Colon cancer ([place])   . Chronic kidney disease     renal insuff, increased uric acid  . Seizures ([place])   . Diabetes mellitus   . BPH (benign prostatic hyperplasia)     increased PSA  . Anemia     PSH: Past Surgical History  Procedure Laterality Date  . Hip arthroplasty    . Tonsillectomy    . S/p resection of rectal cancer    . Vasectomy    . Ileostomy  12/2010  . Reversal ileostomy  03/2011  . Colon surgery    . Joint replacement      Social History:  reports that he has never smoked. He has never used smokeless tobacco. He reports that he drinks about 2.4 oz of alcohol per week. He reports that he does not use illicit drugs.  Allergies:  No Known Allergies  Medications: Current Facility-Administered Medications  Medication Dose Route Frequency Provider Last Rate Last Dose  . 0.9 %  sodium chloride infusion   Intravenous Continuous Reubin Milan, MD      . amLODipine (NORVASC) tablet 5 mg  5 mg Oral Daily Reubin Milan, MD      .  aspirin chewable tablet 81 mg  81 mg Oral Daily Reubin Milan, MD      . donepezil (ARICEPT) tablet 10 mg  10 mg Oral Daily Reubin Milan, MD      . doxazosin (CARDURA) tablet 4 mg  4 mg Oral Daily Reubin Milan, MD      . finasteride (PROSCAR) tablet 5 mg  5 mg Oral Daily Reubin Milan, MD      . furosemide (LASIX) tablet 20 mg  20 mg Oral Daily Reubin Milan, MD      . HYDROcodone-acetaminophen (NORCO/VICODIN) 5-325 MG per tablet 1 tablet  1 tablet Oral Q4H PRN Reubin Milan, MD   1 tablet at 11/16/15 0418  . HYDROmorphone (DILAUDID) injection 0.5 mg  0.5 mg Intravenous Q2H PRN Reubin Milan, MD   0.5 mg at 11/16/15 0418  . insulin aspart (novoLOG) injection 0-20 Units  0-20 Units Subcutaneous TID WC Reubin Milan, MD   4 Units at 11/16/15 0730  . lamoTRIgine (LAMICTAL) tablet 100 mg  100 mg Oral TID Reubin Milan, MD      . metoprolol succinate (TOPROL-XL) 24 hr tablet 25 mg  25 mg Oral Daily Reubin Milan, MD      . nitroGLYCERIN (NITROSTAT) SL tablet 0.4 mg  0.4 mg Sublingual Q5 min PRN Reubin Milan, MD      . pantoprazole (PROTONIX) EC tablet 40 mg  40 mg Oral Daily Reubin Milan, MD   40 mg at 11/16/15 0345  . pravastatin (PRAVACHOL) tablet 20 mg  20 mg Oral q1800 Reubin Milan, MD      . sodium chloride 0.9 % injection 3 mL  3 mL Intravenous Q12H Reubin Milan, MD   3 mL at 11/16/15 0418    Results for orders placed or performed during the hospital encounter of 11/15/15 (from the past 48 hour(s))  CBC with Differential     Status: Abnormal   Collection Time: 11/16/15 12:08 AM  Result Value Ref Range   WBC 10.1 4.0 - 10.5 K/uL   RBC 3.97 (L) 4.22 - 5.81 MIL/uL   Hemoglobin 10.5 (L) 13.0 - 17.0 g/dL   HCT 33.6 (L) 39.0 - 52.0 %   MCV 84.6 78.0 - 100.0 fL   MCH 26.4 26.0 - 34.0 pg   MCHC 31.3 30.0 - 36.0 g/dL   RDW 14.7 11.5 - 15.5 %   Platelets 216 150 - 400 K/uL   Neutrophils Relative % 82 %   Neutro Abs 8.2  (H) 1.7 - 7.7 K/uL   Lymphocytes Relative 14 %   Lymphs Abs 1.4 0.7 - 4.0 K/uL   Monocytes Relative 4 %   Monocytes Absolute 0.4 0.1 - 1.0 K/uL   Eosinophils Relative 0 %   Eosinophils Absolute 0.0 0.0 - 0.7 K/uL   Basophils Relative 0 %   Basophils Absolute 0.0 0.0 - 0.1 K/uL  Basic metabolic panel     Status: Abnormal   Collection Time: 11/16/15 12:08 AM  Result Value Ref Range   Sodium 136 135 - 145 mmol/L   Potassium 4.6 3.5 - 5.1 mmol/L   Chloride 102 101 - 111 mmol/L   CO2 28 22 - 32 mmol/L   Glucose, Bld 177 (H) 65 - 99 mg/dL   BUN 18 6 - 20 mg/dL   Creatinine, Ser 1.43 (H) 0.61 - 1.24 mg/dL   Calcium 8.4 (L) 8.9 - 10.3 mg/dL   GFR calc non Af Amer 44 (L) >60 mL/min   GFR calc Af Amer 51 (L) >60 mL/min    Comment: (NOTE) The eGFR has been calculated using the CKD EPI equation. This calculation has not been validated in all clinical situations. eGFR's persistently <60 mL/min signify possible Chronic Kidney Disease.    Anion gap 6 5 - 15  Urinalysis, Routine w reflex microscopic (not at [facility])     Status: None   Collection Time: 11/16/15 12:13 AM  Result Value Ref Range   Color, Urine YELLOW YELLOW   APPearance CLEAR CLEAR   Specific Gravity, Urine 1.015 1.005 - 1.030   pH 6.5 5.0 - 8.0   Glucose, UA NEGATIVE NEGATIVE mg/dL   Hgb urine dipstick NEGATIVE NEGATIVE   Bilirubin Urine NEGATIVE NEGATIVE   Ketones, ur NEGATIVE NEGATIVE mg/dL   Protein, ur NEGATIVE NEGATIVE mg/dL   Nitrite NEGATIVE NEGATIVE   Leukocytes, UA NEGATIVE NEGATIVE    Comment: MICROSCOPIC NOT DONE ON URINES WITH NEGATIVE PROTEIN, BLOOD, LEUKOCYTES, NITRITE, OR GLUCOSE <1000 mg/dL.  Glucose, capillary     Status: Abnormal   Collection Time: 11/16/15  3:54 AM  Result Value Ref Range   Glucose-Capillary 161 (H) 65 -  99 mg/dL  Glucose, capillary     Status: Abnormal   Collection Time: 11/16/15  6:44 AM  Result Value Ref Range   Glucose-Capillary 151 (H) 65 - 99 mg/dL   Dg Hip Unilat With Pelvis  2-3 Views Right  11/15/2015  CLINICAL DATA:  Right lateral hip pain after fall. EXAM: DG HIP (WITH OR WITHOUT PELVIS) 2-3V RIGHT COMPARISON:  None. FINDINGS: There is an intertrochanteric right hip fracture. There is no dislocation. There is no bone lesion to suggest a pathologic basis for the fracture. IMPRESSION: Intertrochanteric right hip fracture. Electronically Signed   By: Andreas Newport M.D.   On: 11/15/2015 23:17    ROS: ROS Inability to bear weight right lower extremity Otherwise ros negative  Physical Exam: Alert and oriented 81 y/o male in no acute distress Bilateral upper extremities with full rom, no deformities  Right lower extremity with shortening and external rotation nv intact distally Left lower extremity s/p total in good position with no deformity No rashes or edema distally Physical Exam   Assessment/Plan Assessment: right intertroch femur fracture  Plan: Plan for surgical management later this morning Keep NPO Strict non weight bearing right lower extremity

## 2015-11-16 NOTE — Op Note (Signed)
[name], [name] NO.:  [id number]  MEDICAL RECORD NO.:  [id number]  LOCATION:  5N03C                        FACILITY:  [facility]  PHYSICIAN:  Rod Can, MD     DATE OF BIRTH:  10-31-1932  DATE OF PROCEDURE:  11/16/2015 DATE OF DISCHARGE:                              OPERATIVE REPORT   SURGEON:  Rod Can, MD  ASSISTANT:  Merla Riches, PA-C.  PREOPERATIVE DIAGNOSIS:  Right intertrochanteric femur fracture.  POSTOPERATIVE DIAGNOSIS:  Right intertrochanteric femur fracture.  PROCEDURE PERFORMED:  Intramedullary fixation of right intertrochanteric femur fracture.  IMPLANTS: 1. Synthes TFNA nail 11 x 170 mm, 125 degrees. 2. 110 mm TFN advanced helical blade. 3. 5.0 mm distal interlocking screw x1.  ANESTHESIA:  General.  ANTIBIOTICS:  2 g Ancef.  ESTIMATED BLOOD LOSS:  400 mL.  COMPLICATIONS:  None.  DISPOSITION:  Stable to PACU.  SPECIMENS:  None.  TUBES AND DRAINS:  None.  INDICATIONS:  The patient is an 81 year old male who tripped over a dog yesterday and fell onto his right hip.  He was unable to weight bear. He was brought to the emergency department where x-rays revealed a right intertrochanteric femur fracture.  He was admitted to the hospitalist service and underwent perioperative risk stratification, medical optimization.  Risks, benefits, and alternatives of above-mentioned procedure were explained the patient and his family and they elected to proceed.  DESCRIPTION OF PROCEDURE IN DETAIL:  The patient was identified in the holding area using 2 identifiers.  I marked the surgical site.  He was taken to the operating room and spinal was induced on his bed.  A Foley catheter was placed.  He was placed onto the [facility] table.  The left lower extremity was scissored underneath the right lower extremity.  This fracture was reduced using standard traction, internal rotation, and adduction. The right hip was prepped and draped in  normal sterile surgical fashion.  Time-out was called verifying site and site of surgery.  I began by making a 4 cm incision proximal to the tip of the trochanter.  I used the awl to establish the standard starting point for trochanteric entry nail.  I advanced the guide pin.  I then used the opening reamer.  I then inserted the real nail using a separate stab incision.  I advanced the guide pin for the cephalomedullary device into the femoral head into a center-center position.  Tip apex distance was appropriate.  There was no chondral penetration.  I then measured and reamed.  I placed the real helical blade.  I compressed the fracture through the jig.  I then tightened the set screw through a separate stab incision.  I placed 1 distal interlocking screw.  I removed the jig. Final AP and lateral fluoroscopy views were obtained.  Fracture was reduced near anatomically.  There was no chondral penetration.  Tip apex distance were appropriate.  Wounds were copiously irrigated with saline. Wounds were closed in layers using #1 Vicryl for the fascia, 2-0 Monocryl for the deep dermal layer, and 3-0 running Monocryl subcuticular stitch.  Dermabond was applied.  Once the glue was hardened, sterile dressing was applied.  The patient was  then transferred to his bed, aroused from Anesthesia, taken to PACU in stable condition.  Sponge, needle, and instrument counts were correct at the end the case x2.  There were no known complications.  Postoperatively, we will readmit the patient to the hospitalist.  He may weight bear as tolerated with a walker.  We will put him on aspirin 325 mg p.o. twice daily for DVT prophylaxis.  He will work with physical and occupational therapy.  He will need disposition planning.  I will see him back in the office 2 weeks postoperatively.          ______________________________ Rod Can, MD     BS/MEDQ  D:  11/16/2015  T:  11/16/2015  Job:  [id number]

## 2015-11-16 NOTE — Brief Op Note (Signed)
11/15/2015 - 11/16/2015  11:07 AM  PATIENT:  [name]  81 y.o. male  PRE-OPERATIVE DIAGNOSIS:  right intertrochanteric hip fracture  POST-OPERATIVE DIAGNOSIS:  right intertrochanteric hip fracture  PROCEDURE:  Procedure(s): INTRAMEDULLARY TROCHANTERIC HIP NAILING (Right)  SURGEON:  Surgeon(s) and Role:    * Rod Can, MD - Primary  PHYSICIAN ASSISTANT: Merla Riches, PA-C  ANESTHESIA:   spinal  EBL:  Total I/O In: 1000 [I.V.:1000] Out: 535 [Urine:135; Blood:400]  BLOOD ADMINISTERED:none  DRAINS: none   LOCAL MEDICATIONS USED:  NONE  SPECIMEN:  No Specimen  DISPOSITION OF SPECIMEN:  N/A  COUNTS:  YES  TOURNIQUET:  * No tourniquets in log *  DICTATION: .Other Dictation: Dictation Number [id number]  PLAN OF CARE: Admit to inpatient   PATIENT DISPOSITION:  PACU - hemodynamically stable.   Delay start of Pharmacological VTE agent (>24hrs) due to surgical blood loss or risk of bleeding: no

## 2015-11-16 NOTE — Progress Notes (Signed)
Utilization Review Completed.[name] T12/03/2015  

## 2015-11-16 NOTE — Anesthesia Procedure Notes (Signed)
Spinal Patient location during procedure: OR Start time: 11/16/2015 9:40 AM End time: 11/16/2015 9:45 AM Staffing Performed by: anesthesiologist  Preanesthetic Checklist Completed: patient identified, site marked, surgical consent, pre-op evaluation, timeout performed, IV checked, risks and benefits discussed and monitors and equipment checked Spinal Block Patient position: right lateral decubitus Prep: Betadine Patient monitoring: cardiac monitor, continuous pulse ox and blood pressure Approach: midline Location: L3-4 Injection technique: single-shot Needle Needle type: Tuohy  Needle gauge: 22 G Needle length: 9 cm Needle insertion depth: 5 cm Assessment Sensory level: T6 Additional Notes 10 mg 0.75% Bupivacaine injected easily

## 2015-11-16 NOTE — Interval H&P Note (Signed)
History and Physical Interval Note:  11/16/2015 8:46 AM  [name]  has presented today for surgery, with the diagnosis of right intertrochanteric hip fracture  The various methods of treatment have been discussed with the patient and family. After consideration of risks, benefits and other options for treatment, the patient has consented to  Procedure(s): INTRAMEDULLARY TROCHANTERIC HIP NAILING (Right) as a surgical intervention .  The patient's history has been reviewed, patient examined, no change in status, stable for surgery.  I have reviewed the patient's chart and labs.  Questions were answered to the patient's satisfaction.     Bentlee Drier, Horald Pollen

## 2015-11-16 NOTE — Anesthesia Postprocedure Evaluation (Signed)
Anesthesia Post Note  Patient: [name]  Procedure(s) Performed: Procedure(s) (LRB): INTRAMEDULLARY TROCHANTERIC HIP NAILING (Right)  Patient location during evaluation: PACU Anesthesia Type: MAC and Spinal Level of consciousness: awake and awake and alert Pain management: pain level controlled Vital Signs Assessment: post-procedure vital signs reviewed and stable Respiratory status: spontaneous breathing and nonlabored ventilation Postop Assessment: no headache and spinal receding Anesthetic complications: no    Last Vitals:  Filed Vitals:   11/16/15 1747 11/16/15 1946  BP: 132/47 141/63  Pulse: 94 88  Temp: 37.8 C 36.9 C  Resp: 18 18    Last Pain:  Filed Vitals:   11/16/15 1959  PainSc: Asleep                 Ellinor Test COKER

## 2015-11-17 ENCOUNTER — Encounter (HOSPITAL_COMMUNITY): Payer: Self-pay | Admitting: Orthopedic Surgery

## 2015-11-17 ENCOUNTER — Telehealth: Payer: Self-pay

## 2015-11-17 ENCOUNTER — Inpatient Hospital Stay (HOSPITAL_COMMUNITY): Payer: Medicare Other

## 2015-11-17 ENCOUNTER — Telehealth: Payer: Self-pay | Admitting: Cardiology

## 2015-11-17 DIAGNOSIS — Z01810 Encounter for preprocedural cardiovascular examination: Secondary | ICD-10-CM

## 2015-11-17 DIAGNOSIS — E785 Hyperlipidemia, unspecified: Secondary | ICD-10-CM

## 2015-11-17 LAB — GLUCOSE, CAPILLARY
GLUCOSE-CAPILLARY: 145 mg/dL — AB (ref 65–99)
GLUCOSE-CAPILLARY: 173 mg/dL — AB (ref 65–99)
GLUCOSE-CAPILLARY: 180 mg/dL — AB (ref 65–99)
Glucose-Capillary: 201 mg/dL — ABNORMAL HIGH (ref 65–99)

## 2015-11-17 LAB — BASIC METABOLIC PANEL
ANION GAP: 6 (ref 5–15)
Anion gap: 6 (ref 5–15)
BUN: 19 mg/dL (ref 6–20)
BUN: 20 mg/dL (ref 6–20)
CALCIUM: 7.8 mg/dL — AB (ref 8.9–10.3)
CHLORIDE: 104 mmol/L (ref 101–111)
CO2: 27 mmol/L (ref 22–32)
CO2: 24 mmol/L (ref 22–32)
CREATININE: 1.84 mg/dL — AB (ref 0.61–1.24)
Calcium: 7.9 mg/dL — ABNORMAL LOW (ref 8.9–10.3)
Chloride: 103 mmol/L (ref 101–111)
Creatinine, Ser: 1.87 mg/dL — ABNORMAL HIGH (ref 0.61–1.24)
GFR calc non Af Amer: 33 mL/min — ABNORMAL LOW (ref >60)
GFR, EST AFRICAN AMERICAN: 38 mL/min — AB (ref >60)
GFR, EST AFRICAN AMERICAN: 37 mL/min — AB (ref >60)
GFR, EST NON AFRICAN AMERICAN: 32 mL/min — AB (ref >60)
Glucose, Bld: 171 mg/dL — ABNORMAL HIGH (ref 65–99)
Glucose, Bld: 170 mg/dL — ABNORMAL HIGH (ref 65–99)
POTASSIUM: 5.5 mmol/L — AB (ref 3.5–5.1)
Potassium: 5.5 mmol/L — ABNORMAL HIGH (ref 3.5–5.1)
SODIUM: 134 mmol/L — AB (ref 135–145)
Sodium: 136 mmol/L (ref 135–145)

## 2015-11-17 LAB — CBC
HEMATOCRIT: 25.7 % — AB (ref 39.0–52.0)
HEMOGLOBIN: 8 g/dL — AB (ref 13.0–17.0)
MCH: 25.9 pg — ABNORMAL LOW (ref 26.0–34.0)
MCHC: 30.2 g/dL (ref 30.0–36.0)
MCV: 85.9 fL (ref 78.0–100.0)
Platelets: 213 10*3/uL (ref 150–400)
RBC: 3.05 MIL/uL — AB (ref 4.22–5.81)
RDW: 15.4 % (ref 11.5–15.5)
WBC: 12.4 10*3/uL — AB (ref 4.0–10.5)

## 2015-11-17 MED ORDER — GLUCERNA SHAKE PO LIQD
237.0000 mL | Freq: Three times a day (TID) | ORAL | Status: DC
  Administered 2015-11-17 – 2015-11-19 (×5): 237 mL via ORAL

## 2015-11-17 MED ORDER — LACTULOSE 10 GM/15ML PO SOLN
30.0000 g | Freq: Once | ORAL | Status: AC
  Administered 2015-11-17: 30 g via ORAL
  Filled 2015-11-17: qty 45

## 2015-11-17 NOTE — Telephone Encounter (Signed)
Will make dr crenshaw aware 

## 2015-11-17 NOTE — Progress Notes (Signed)
PT Cancellation Note  Patient Details Name: [name] MRN: [id number] DOB: 02/16/1932   Cancelled Treatment:    Reason Eval/Treat Not Completed: Patient at procedure or test/unavailable   [name] 11/17/2015, 10:06 AM Elwyn Reach, [place]

## 2015-11-17 NOTE — Progress Notes (Signed)
Initial Nutrition Assessment  DOCUMENTATION CODES:   Not applicable  INTERVENTION:  Provide Glucerna Shake po TID, each supplement provides 220 kcal and 10 grams of protein.  Encourage adequate PO intake.   NUTRITION DIAGNOSIS:   Inadequate oral intake related to poor appetite as evidenced by meal completion < 50%.  GOAL:   Patient will meet greater than or equal to 90% of their needs  MONITOR:   PO intake, Supplement acceptance, Weight trends, Labs, I & O's  REASON FOR ASSESSMENT:   Consult Hip fracture protocol  ASSESSMENT:   81 y.o. male with a past medical history of CAD, type 2 diabetes, hyperlipidemia, hypertension,chronic anemia, BPH, history of rectal cancer who comes to the emergency department due to having right hip pain after having a fall at home.  PROCEDURE:(12/4): INTRAMEDULLARY TROCHANTERIC HIP NAILING (Right)   Pt reports having a decreased appetite since admission. No recent percent meal completion recorded, however pt reports 25% po intake this AM. Pt reports eating well PTA with consumption of 3 meals a day with no other difficulties. Pt reports no weight loss. Pt is agreeable to nutritional supplements to aid in caloric and protein needs. RD to order. Pt was encouraged to eat his food at meals.   Pt with no observed significant fat or muscle mass loss.  Labs: Low sodium, calcium, and GFR. High potassium and creatinine.   Diet Order:  Diet Carb Modified Fluid consistency:: Thin; Room service appropriate?: Yes  Skin:   (Incision on R thigh)  Last BM:  12/3  Height:   Ht Readings from Last 1 Encounters:  10/24/15 6\' 2"  (1.88 m)    Weight:   Wt Readings from Last 1 Encounters:  10/24/15 217 lb (98.431 kg)    Ideal Body Weight:  86 kg  BMI:  There is no weight on file to calculate BMI.  Estimated Nutritional Needs:   Kcal:  2000-2200  Protein:  95-105 grams  Fluid:  2- 2.2 L/day  EDUCATION NEEDS:   No education needs  identified at this time  Corrin Parker, MS, RD, LDN Pager # [phone] After hours/ weekend pager # [phone]

## 2015-11-17 NOTE — Clinical Documentation Improvement (Signed)
Orthopedic  Abnormal Lab/Test Results:  11/17/15: Hgb= 8.0  Possible Clinical Conditions associated with below indicators  Acute Blood [place] Anemia  Other Condition  Cannot Clinically Determine  Supporting Information:  11/16/15: Hgb= 10.5, 11.2 11/16/15: EBL= 400 cc  Please exercise your independent, professional judgment when responding. A specific answer is not anticipated or expected.   Thank You,  Rolm Gala, RN, [place] [phone]

## 2015-11-17 NOTE — Telephone Encounter (Signed)
[name] wanted Dr. Stanford Breed to know that her dad fell last week and broke his hip.  He is in [facility] and his physician is running all kinds of heart tests--if Dr. Stanford Breed would like to see those results.

## 2015-11-17 NOTE — Telephone Encounter (Signed)
I will stop by and see him

## 2015-11-17 NOTE — Progress Notes (Signed)
   Subjective:  Patient reports pain as mild to moderate.  No c/o.  Objective:   VITALS:   Filed Vitals:   11/16/15 1747 11/16/15 1946 11/17/15 0049 11/17/15 0447  BP: 132/47 141/63 119/51 114/49  Pulse: 94 88 81 78  Temp: 100 F (37.8 C) 98.4 F (36.9 C) 98.5 F (36.9 C) 98.3 F (36.8 C)  TempSrc: Oral Oral Oral Oral  Resp: 18 18 16 16   SpO2: 90% 92% 98% 98%    ABD soft Sensation intact distally Intact pulses distally Dorsiflexion/Plantar flexion intact Incision: dressing C/D/I Compartment soft No hematoma   Lab Results  Component Value Date   WBC 12.4* 11/17/2015   HGB 8.0* 11/17/2015   HCT 25.7* 11/17/2015   MCV 85.9 11/17/2015   PLT 213 11/17/2015   BMET    Component Value Date/Time   NA 136 11/17/2015 0347   NA 139 04/02/2015 1122   K 5.5* 11/17/2015 0347   K 5.3* 04/02/2015 1122   CL 103 11/17/2015 0347   CL 104 02/21/2013 1026   CO2 27 11/17/2015 0347   CO2 24 04/02/2015 1122   GLUCOSE 171* 11/17/2015 0347   GLUCOSE 192* 04/02/2015 1122   GLUCOSE 133* 02/21/2013 1026   BUN 19 11/17/2015 0347   BUN 27.0* 04/02/2015 1122   CREATININE 1.84* 11/17/2015 0347   CREATININE 1.59* 08/14/2015 1425   CREATININE 1.9* 04/02/2015 1122   CREATININE 0.89 07/01/2010 1044   CALCIUM 7.8* 11/17/2015 0347   CALCIUM 8.6 04/02/2015 1122   GFRNONAA 33* 11/17/2015 0347   GFRNONAA 40* 08/14/2015 1425   GFRAA 38* 11/17/2015 0347   GFRAA 46* 08/14/2015 1425     Assessment/Plan: 1 Day Post-Op   Principal Problem:   Closed right hip fracture (HCC) Active Problems:   DM type 2 (diabetes mellitus, type 2) (HCC)   Hyperlipidemia   Essential hypertension   CAD, NATIVE VESSEL   BPH (benign prostatic hyperplasia)   Anemia of chronic disease   Fracture, intertrochanteric, right femur ([place])   Type 2 diabetes mellitus with HbA1C goal below 7.5    WBAT with walker DVT ppx: ASA 325mg  PO BID x4 weeks, TEDs, SCDs PO pain control PT/OT DIspo: D/C  planning    Rye Decoste, Horald Pollen 11/17/2015, 7:40 AM   Rod Can, MD Cell [phone]

## 2015-11-17 NOTE — Progress Notes (Signed)
[facility] Progress Note   [name]  [id number]  DOB: [birth year]  DOA: 11/15/2015 PCP: [name], MD  Brief narrative: [name] is a 81 y.o. male with PMH of CAD, DM2, HLD, HTN and rectal cancer in the past who presents for a mechanical fall at home.   Subjective: Having pain in hip when moving. No nausea, vomiting, diarrhea or constipation.   Assessment/Plan: Principal Problem:   Closed right hip fracture  - per ortho  Active Problems:   DM type 2 (diabetes mellitus, type 2)  - cont SSI  Hyperkalemia - due to ARF?- double checked and still noted to be elevated- have given Kayexalate- will follow closely    Hyperlipidemia - cont pravastatin    Essential hypertension - Continue amlodipine 5 mg by mouth daily, metoprolol 25 mg by mouth daily - on furosemide 20 mg by mouth daily- will hold this as Cr rising- f/u ECHO to see if he has CHF and if this needs to be continued.   AKI on CKD3 - Cr rising- d/c Furosemide- give slow IVF - follow    CAD, NATIVE VESSEL - cont Aspirin and B blocker    BPH (benign prostatic hyperplasia) - cont Proscar and Cardura    Anemia of chronic disease - post op drop noted- cont to follow     Code Status:     Code Status Orders        Start     Ordered   11/16/15 1243  Full code   Continuous     11/16/15 1242     Family Communication: daughter  Disposition Plan: SNF DVT prophylaxis: ASA per ortho Consultants:ortho Procedures: right hip nailing   Antibiotics: Anti-infectives    Start     Dose/Rate Route Frequency Ordered Stop   11/16/15 1600  ceFAZolin (ANCEF) IVPB 2 g/50 mL premix     2 g 100 mL/hr over 30 Minutes Intravenous Every 6 hours 11/16/15 1242 11/16/15 2152   11/16/15 0857  ceFAZolin (ANCEF) 2-3 GM-% IVPB SOLR    Comments:  Marinda Elk   : cabinet override      11/16/15 0857 11/16/15 1014      Objective: There were no vitals filed for this visit.  Intake/Output Summary  (Last 24 hours) at 11/17/15 1228 Last data filed at 11/17/15 [id number]  Gross per 24 hour  Intake    240 ml  Output    600 ml  Net   -360 ml     Vitals Filed Vitals:   11/16/15 1747 11/16/15 1946 11/17/15 0049 11/17/15 0447  BP: 132/47 141/63 119/51 114/49  Pulse: 94 88 81 78  Temp: 100 F (37.8 C) 98.4 F (36.9 C) 98.5 F (36.9 C) 98.3 F (36.8 C)  TempSrc: Oral Oral Oral Oral  Resp: 18 18 16 16   SpO2: 90% 92% 98% 98%    Exam:  General:  Pt is alert, not in acute distress  HEENT: No icterus, No thrush, oral mucosa moist  Cardiovascular: regular rate and rhythm, S1/S2 No murmur  Respiratory: clear to auscultation bilaterally   Abdomen: Soft, +Bowel sounds, non tender, non distended, no guarding  MSK: No LE edema, cyanosis or clubbing  Data Reviewed: Basic Metabolic Panel:  Recent Labs Lab 11/16/15 0008 11/16/15 1015 11/17/15 0347 11/17/15 1042  NA 136 140 136 134*  K 4.6 4.7 5.5* 5.5*  CL 102  --  103 104  CO2 28  --  27 24  GLUCOSE 177* 119*  171* 170*  BUN 18  --  19 20  CREATININE 1.43*  --  1.84* 1.87*  CALCIUM 8.4*  --  7.8* 7.9*   Liver Function Tests: No results for input(s): AST, ALT, ALKPHOS, BILITOT, PROT, ALBUMIN in the last 168 hours. No results for input(s): LIPASE, AMYLASE in the last 168 hours. No results for input(s): AMMONIA in the last 168 hours. CBC:  Recent Labs Lab 11/16/15 0008 11/16/15 1015 11/17/15 0347  WBC 10.1  --  12.4*  NEUTROABS 8.2*  --   --   HGB 10.5* 11.2* 8.0*  HCT 33.6* 33.0* 25.7*  MCV 84.6  --  85.9  PLT 216  --  213   Cardiac Enzymes: No results for input(s): CKTOTAL, CKMB, CKMBINDEX, TROPONINI in the last 168 hours. BNP (last 3 results) No results for input(s): BNP in the last 8760 hours.  ProBNP (last 3 results) No results for input(s): PROBNP in the last 8760 hours.  CBG:  Recent Labs Lab 11/16/15 1125 11/16/15 1732 11/16/15 2130 11/17/15 0614 11/17/15 1140  GLUCAP 136* 182* 160* 145* 173*     No results found for this or any previous visit (from the past 240 hour(s)).   Studies: Chest Portable 1 View  11/16/2015  CLINICAL DATA:  Pre-op for right hip surgery today. Right hip fracture. EXAM: PORTABLE CHEST 1 VIEW COMPARISON:  Radiographs 12/03/2011.  Chest CT 05/01/2011. FINDINGS: 0630 hours. There are lower lung volumes with mildly increased bibasilar atelectasis. There is chronic elevation of the left hemidiaphragm which appears unchanged. The heart size and mediastinal contours are stable. No edema, pneumothorax or significant pleural effusion. IMPRESSION: Mildly increased bibasilar atelectasis related to suboptimal inspiration. No other significant changes. Electronically Signed   By: Richardean Sale M.D.   On: 11/16/2015 08:55   Dg Hip Port Unilat With Pelvis 1v Right  11/16/2015  CLINICAL DATA:  Surgery follow-up. EXAM: DG HIP (WITH OR WITHOUT PELVIS) 1V PORT RIGHT COMPARISON:  Fluoroscopic intraoperative images dated 11/16/2015 and preoperative plain film of the right hip dated 11/15/2015. FINDINGS: Rod and screw fixation hardware traversing the right femoral neck fracture site appears intact and stable in alignment compared to the intraoperative fluoroscopic images. No surgical complicating features seen. Osseous alignment is stable. Left hip arthroplasty hardware also appear stable in alignment. Soft tissues about the pelvis and right hip are unremarkable. IMPRESSION: Status post internal fixation of the intertrochanteric right femur fracture. Hardware appears intact and well aligned. No surgical complicating feature. Electronically Signed   By: Franki Cabot M.D.   On: 11/16/2015 13:59   Dg Hip Operative Unilat With Pelvis Right  11/16/2015  CLINICAL DATA:  Right hip nailing for intertrochanteric fracture. EXAM: OPERATIVE RIGHT HIP (WITH PELVIS IF PERFORMED) 3 VIEWS TECHNIQUE: Fluoroscopic spot image(s) were submitted for interpretation post-operatively. COMPARISON:  Radiographs  11/15/2015. FINDINGS: Three views demonstrate fixation of the intertrochanteric right femur fracture with a short segment intramedullary nail secured by a distal interlocking screw. The hardware appears well position. There is no dislocation or displacement of the fracture. IMPRESSION: No demonstrated complication following ORIF of intertrochanteric right femur fracture. Electronically Signed   By: Richardean Sale M.D.   On: 11/16/2015 11:30   Dg Hip Unilat With Pelvis 2-3 Views Right  11/15/2015  CLINICAL DATA:  Right lateral hip pain after fall. EXAM: DG HIP (WITH OR WITHOUT PELVIS) 2-3V RIGHT COMPARISON:  None. FINDINGS: There is an intertrochanteric right hip fracture. There is no dislocation. There is no bone lesion to suggest a  pathologic basis for the fracture. IMPRESSION: Intertrochanteric right hip fracture. Electronically Signed   By: Andreas Newport M.D.   On: 11/15/2015 23:17    Scheduled Meds:  Scheduled Meds: . amLODipine  5 mg Oral Daily  . aspirin EC  325 mg Oral BID PC  . docusate sodium  100 mg Oral BID  . donepezil  10 mg Oral Daily  . doxazosin  4 mg Oral Daily  . finasteride  5 mg Oral Daily  . insulin aspart  0-20 Units Subcutaneous TID WC  . lamoTRIgine  100 mg Oral TID  . metoprolol succinate  25 mg Oral Daily  . pantoprazole  40 mg Oral Daily  . pravastatin  20 mg Oral q1800  . senna  1 tablet Oral BID  . sodium chloride  3 mL Intravenous Q12H   Continuous Infusions: . sodium chloride 75 mL/hr (11/16/15 2342)    Time spent on care of this patient: 61 min   [place], MD 11/17/2015, 12:28 PM  LOS: 1 day   [facility] Office  [phone] Pager - Text Page per [email or web address] If 7PM-7AM, please contact night-coverage [email or web address]

## 2015-11-17 NOTE — Evaluation (Signed)
Physical Therapy Evaluation Patient Details Name: [name] MRN: [id number] DOB: 11-Aug-1932 Today's Date: 11/17/2015   History of Present Illness  Pt admitted after fall at home with right femur fx s/p IM nail. PMHx:DM, CVA, HTN  Clinical Impression  Pt initially pleasant on initial arrival to room, able to provide PLOF and home setup. However with initiation of movement pt denied. Pt was medicated and returned for continuation of eval. Pt denied OOB or getting to EOB with attempts at movement. He was resistive and at times combative with therapist before finally participating to roll to side for positioning and sliding up in bed. Pt and dgtr-in-law educated throughout for risk of pressure sores, PNA and detrimental effects of prolonged bed immobility. Pt with some confusion throughout session regarding situation and time. Pt with decreased strength, function, mobility and activity tolerance who will benefit from acute therapy to maximize mobility, function and strength to decrease burden of care. RN aware of all above and present in room end of session.     Follow Up Recommendations SNF;Supervision/Assistance - 24 hour    Equipment Recommendations  None recommended by PT    Recommendations for Other Services OT consult     Precautions / Restrictions Precautions Precautions: Fall Precaution Comments: combative and resistive on eval Restrictions RLE Weight Bearing: Weight bearing as tolerated      Mobility  Bed Mobility Overal bed mobility: Needs Assistance;+2 for physical assistance Bed Mobility: Rolling Rolling: Total assist;+2 for physical assistance         General bed mobility comments: attempted supine to sit x 3 with cues and use of pad to initiate pivot to EOB however as soon as movement initiated pt resistive and at times combative grabbing therapist arm. Pt was able to reach for rail with RUE with total assist to perform roll onto side with pillow placed under hip.  Max +2 to slide toward [facility] with bed in trendelenburg  Transfers                 General transfer comment: pt refused OOB or EOB at all, In chair position in bed end of session   Ambulation/Gait                Stairs            Wheelchair Mobility    Modified Rankin (Stroke Patients Only)       Balance                                             Pertinent Vitals/Pain Pain Assessment: 0-10 Pain Score: 4  Pain Location: right hip pt reports intense pain but no change after medication and still rates as 4/10 Pain Intervention(s): Limited activity within patient's tolerance;Monitored during session;Premedicated before session;Repositioned    Home Living Family/patient expects to be discharged to:: Skilled nursing facility Living Arrangements: Children Available Help at Discharge: Family Type of Home: House Home Access: Level entry     Home Layout: Multi-level;Bed/bath upstairs Home Equipment: Environmental consultant - 2 wheels;Cane - quad;Bedside commode;Grab bars - tub/shower;Grab bars - toilet;Toilet riser      Prior Function Level of Independence: Independent               Hand Dominance   Dominant Hand: Right    Extremity/Trunk Assessment   Upper Extremity Assessment: RUE deficits/detail RUE Deficits / Details: limited  shoulder flexion          Lower Extremity Assessment: Generalized weakness;RLE deficits/detail;LLE deficits/detail RLE Deficits / Details: pt resistive to movement and with assist able to flex knee grossly 20 degrees with assist, would not abduct hip at all, denied further movement of RLE  LLE Deficits / Details: would only flex knee to grossly 40 degrees stating pain, denied further movement of the leg     Communication   Communication: No difficulties  Cognition Arousal/Alertness: Awake/alert Behavior During Therapy: Agitated;Flat affect Overall Cognitive Status: Impaired/Different from baseline Area of  Impairment: Memory;Following commands;Safety/judgement;Problem solving;Orientation Orientation Level: Disoriented to;Situation;Place   Memory: Decreased short-term memory Following Commands: Follows one step commands inconsistently;Follows one step commands with increased time Safety/Judgement: Decreased awareness of deficits;Decreased awareness of safety   Problem Solving: Slow processing;Decreased initiation;Difficulty sequencing;Requires verbal cues;Requires tactile cues General Comments: pt at times aware he is in hospital and had surgery and in the next sentence stating "where am I", "I need to go to the doctor"     General Comments      Exercises        Assessment/Plan    PT Assessment Patient needs continued PT services  PT Diagnosis Difficulty walking;Generalized weakness;Acute pain;Altered mental status   PT Problem List Decreased strength;Decreased cognition;Decreased range of motion;Decreased activity tolerance;Decreased safety awareness;Decreased knowledge of use of DME;Decreased mobility;Pain  PT Treatment Interventions DME instruction;Functional mobility training;Therapeutic activities;Therapeutic exercise;Gait training;Patient/family education   PT Goals (Current goals can be found in the Care Plan section) Acute Rehab PT Goals Patient Stated Goal: to not hurt PT Goal Formulation: With patient/family Time For Goal Achievement: 12/01/15 Potential to Achieve Goals: Fair    Frequency Min 3X/week   Barriers to discharge Decreased caregiver support      Co-evaluation               End of Session   Activity Tolerance: Patient limited by pain Patient left: in bed;with call bell/phone within reach;with bed alarm set;with nursing/sitter in room;with family/visitor present Nurse Communication: Mobility status;Precautions;Weight bearing status         Time: 1332-1415 PT Time Calculation (min) (ACUTE ONLY): 43 min   Charges:   PT Evaluation $Initial PT  Evaluation Tier I: 1 Procedure PT Treatments $Therapeutic Activity: 23-37 mins   PT G CodesMelford Aase 11/17/2015, 2:28 PM Elwyn Reach, [place]

## 2015-11-17 NOTE — Care Management Note (Signed)
Case Management Note  Patient Details  Name: [name] MRN: [id number] Date of Birth: [birth year]  Subjective/Objective:                    Action/Plan: Patient will need shortterm rehab at [facility] . Social worker is aware. No case manager needs identified.   Expected Discharge Date:                  Expected Discharge Plan:    [place] In-House Referral:     Discharge planning Services     Post Acute Care Choice:    Choice offered to:     DME Arranged:   NA DME Agency:     HH Arranged:   NA HH Agency:     Status of Service:   Completed.  Medicare Important Message Given:    Date Medicare IM Given:    Medicare IM give by:    Date Additional Medicare IM Given:    Additional Medicare Important Message give by:     If discussed at [place] of Stay Meetings, dates discussed:    Additional Comments:  Ninfa Meeker, RN 11/17/2015, 3:44 PM

## 2015-11-17 NOTE — Progress Notes (Signed)
PT Cancellation Note  Patient Details Name: [name] MRN: [id number] DOB: 06-Jan-1932   Cancelled Treatment:    Reason Eval/Treat Not Completed: Patient at procedure or test/unavailable. Attempted eval with pt able to state home environment and PLOf but refused mobility stating pain 4-5/10 and too intense to attempt moving with. RN notified but no fast acting pain meds available. Will attempt again as time allows this afternoon.    Lanetta Inch Beth 11/17/2015, 11:29 AM Elwyn Reach, [place]

## 2015-11-17 NOTE — Progress Notes (Signed)
  Echocardiogram 2D Echocardiogram has been performed.  [name] 11/17/2015, 10:13 AM

## 2015-11-17 NOTE — Telephone Encounter (Signed)
[name] states her father had an appt with Dr Everlene Farrier but he fell this weekend and broke his hip, he is at [place] really would like to have a callback from Dr Everlene Farrier at [phone], his hoping Dr Everlene Farrier would at least call him because her father isn't listening to anyone but will listen to Dr Everlene Farrier, is at [place] and the phone number is [phone]

## 2015-11-18 DIAGNOSIS — D62 Acute posthemorrhagic anemia: Secondary | ICD-10-CM

## 2015-11-18 LAB — BASIC METABOLIC PANEL
ANION GAP: 4 — AB (ref 5–15)
BUN: 15 mg/dL (ref 6–20)
CHLORIDE: 103 mmol/L (ref 101–111)
CO2: 26 mmol/L (ref 22–32)
Calcium: 7.9 mg/dL — ABNORMAL LOW (ref 8.9–10.3)
Creatinine, Ser: 1.6 mg/dL — ABNORMAL HIGH (ref 0.61–1.24)
GFR calc non Af Amer: 39 mL/min — ABNORMAL LOW (ref >60)
GFR, EST AFRICAN AMERICAN: 45 mL/min — AB (ref >60)
Glucose, Bld: 210 mg/dL — ABNORMAL HIGH (ref 65–99)
POTASSIUM: 5.2 mmol/L — AB (ref 3.5–5.1)
SODIUM: 133 mmol/L — AB (ref 135–145)

## 2015-11-18 LAB — CBC
HEMATOCRIT: 24.5 % — AB (ref 39.0–52.0)
HEMOGLOBIN: 7.5 g/dL — AB (ref 13.0–17.0)
MCH: 26 pg (ref 26.0–34.0)
MCHC: 30.6 g/dL (ref 30.0–36.0)
MCV: 84.8 fL (ref 78.0–100.0)
Platelets: 187 10*3/uL (ref 150–400)
RBC: 2.89 MIL/uL — AB (ref 4.22–5.81)
RDW: 15 % (ref 11.5–15.5)
WBC: 10.1 10*3/uL (ref 4.0–10.5)

## 2015-11-18 LAB — GLUCOSE, CAPILLARY
GLUCOSE-CAPILLARY: 190 mg/dL — AB (ref 65–99)
GLUCOSE-CAPILLARY: 168 mg/dL — AB (ref 65–99)
Glucose-Capillary: 145 mg/dL — ABNORMAL HIGH (ref 65–99)
Glucose-Capillary: 175 mg/dL — ABNORMAL HIGH (ref 65–99)

## 2015-11-18 MED ORDER — SENNOSIDES-DOCUSATE SODIUM 8.6-50 MG PO TABS
1.0000 | ORAL_TABLET | Freq: Two times a day (BID) | ORAL | Status: DC
  Administered 2015-11-18 – 2015-11-19 (×3): 1 via ORAL
  Filled 2015-11-18 (×2): qty 1

## 2015-11-18 MED ORDER — BISACODYL 10 MG RE SUPP
10.0000 mg | Freq: Once | RECTAL | Status: AC
Start: 2015-11-18 — End: 2015-11-18
  Administered 2015-11-18: 10 mg via RECTAL
  Filled 2015-11-18: qty 1

## 2015-11-18 MED ORDER — LACTULOSE 10 GM/15ML PO SOLN
30.0000 g | Freq: Once | ORAL | Status: AC
  Administered 2015-11-18: 30 g via ORAL
  Filled 2015-11-18: qty 45

## 2015-11-18 MED ORDER — ASPIRIN EC 325 MG PO TBEC: 325.0000 mg | DELAYED_RELEASE_TABLET | Freq: Two times a day (BID) | ORAL | Status: DC

## 2015-11-18 MED ORDER — HYDROCODONE-ACETAMINOPHEN 5-325 MG PO TABS: 1.0000 | ORAL_TABLET | Freq: Four times a day (QID) | ORAL | Status: DC | PRN

## 2015-11-18 NOTE — Clinical Social Work Note (Signed)
CSW met with patient and his daughter who was at bedside.  Patient has been pre-registered at [facility]  Plan is for patient to discharge to [facility] once he is medically ready for discharge and orders have been received.  Jones Broom. [place], MSW, [place] 11/18/2015 7:54 PM

## 2015-11-18 NOTE — Progress Notes (Addendum)
[facility] Progress Note   [name]  [id number]  DOB: 1932-04-23  DOA: 11/15/2015 PCP: [name], MD  Brief narrative: [name] is a 81 y.o. male with PMH of CAD, DM2, HLD, HTN and rectal cancer in the past who presents for a mechanical fall at home.   Subjective: Still having pain in hip when moving. No nausea, vomiting, diarrhea or constipation.   Assessment/Plan: Principal Problem:   Closed right hip fracture  - per ortho - will need SNF  Active Problems:   DM type 2 (diabetes mellitus, type 2)  - cont SSI  Hyperkalemia - due to ARF?- double checked and still noted to be elevated- have given Kayexalate again today but still not effective- will give dulcolax suppository - cont to hydrate as well    Hyperlipidemia - cont pravastatin    Essential hypertension - Continue amlodipine 5 mg by mouth daily, metoprolol 25 mg by mouth daily - on furosemide 20 mg by mouth daily-  ECHO reveals mild LVH bu no underlying systolic or diastolic dysfuction  AKI on CKD3 - Cr was rising- d/c Furosemide- give slow IVF - Cr improving    CAD, NATIVE VESSEL - cont Aspirin and B blocker    BPH (benign prostatic hyperplasia) - cont Proscar and Cardura    Anemia of chronic disease and acute blood loss anemia - post op drop noted-  cont to follow and transfuse if Hb < 7    Code Status:     Code Status Orders        Start     Ordered   11/16/15 1243  Full code   Continuous     11/16/15 1242     Family Communication: daughter  Disposition Plan: SNF DVT prophylaxis: ASA per ortho Consultants:ortho Procedures: right hip nailing   Antibiotics: Anti-infectives    Start     Dose/Rate Route Frequency Ordered Stop   11/16/15 1600  ceFAZolin (ANCEF) IVPB 2 g/50 mL premix     2 g 100 mL/hr over 30 Minutes Intravenous Every 6 hours 11/16/15 1242 11/16/15 2152   11/16/15 0857  ceFAZolin (ANCEF) 2-3 GM-% IVPB SOLR    Comments:  Marinda Elk   :  cabinet override      11/16/15 0857 11/16/15 1014      Objective: There were no vitals filed for this visit.  Intake/Output Summary (Last 24 hours) at 11/18/15 1938 Last data filed at 11/18/15 1700  Gross per 24 hour  Intake   1245 ml  Output      0 ml  Net   1245 ml     Vitals Filed Vitals:   11/17/15 1500 11/17/15 2226 11/18/15 0408 11/18/15 1423  BP: 136/54 134/48 129/47 128/51  Pulse: 92 91 91 67  Temp: 99.4 F (37.4 C) 98.6 F (37 C) 98.4 F (36.9 C) 97.7 F (36.5 C)  TempSrc:  Oral    Resp: 18 16 17 18   SpO2: 96% 91% 92% 91%    Exam:  General:  Pt is alert, not in acute distress  HEENT: No icterus, No thrush, oral mucosa moist  Cardiovascular: regular rate and rhythm, S1/S2 No murmur  Respiratory: clear to auscultation bilaterally   Abdomen: Soft, +Bowel sounds, non tender, non distended, no guarding  MSK: No LE edema, cyanosis or clubbing  Data Reviewed: Basic Metabolic Panel:  Recent Labs Lab 11/16/15 0008 11/16/15 1015 11/17/15 0347 11/17/15 1042 11/18/15 0835  NA 136 140 136 134* 133*  K 4.6 4.7 5.5* 5.5* 5.2*  CL 102  --  103 104 103  CO2 28  --  27 24 26   GLUCOSE 177* 119* 171* 170* 210*  BUN 18  --  19 20 15   CREATININE 1.43*  --  1.84* 1.87* 1.60*  CALCIUM 8.4*  --  7.8* 7.9* 7.9*   Liver Function Tests: No results for input(s): AST, ALT, ALKPHOS, BILITOT, PROT, ALBUMIN in the last 168 hours. No results for input(s): LIPASE, AMYLASE in the last 168 hours. No results for input(s): AMMONIA in the last 168 hours. CBC:  Recent Labs Lab 11/16/15 0008 11/16/15 1015 11/17/15 0347 11/18/15 0749  WBC 10.1  --  12.4* 10.1  NEUTROABS 8.2*  --   --   --   HGB 10.5* 11.2* 8.0* 7.5*  HCT 33.6* 33.0* 25.7* 24.5*  MCV 84.6  --  85.9 84.8  PLT 216  --  213 187   Cardiac Enzymes: No results for input(s): CKTOTAL, CKMB, CKMBINDEX, TROPONINI in the last 168 hours. BNP (last 3 results) No results for input(s): BNP in the last 8760  hours.  ProBNP (last 3 results) No results for input(s): PROBNP in the last 8760 hours.  CBG:  Recent Labs Lab 11/17/15 1604 11/17/15 2250 11/18/15 0647 11/18/15 1137 11/18/15 1617  GLUCAP 180* 201* 145* 190* 175*    No results found for this or any previous visit (from the past 240 hour(s)).   Studies: No results found.  Scheduled Meds:  Scheduled Meds: . amLODipine  5 mg Oral Daily  . aspirin EC  325 mg Oral BID PC  . docusate sodium  100 mg Oral BID  . donepezil  10 mg Oral Daily  . doxazosin  4 mg Oral Daily  . feeding supplement (GLUCERNA SHAKE)  237 mL Oral TID BM  . finasteride  5 mg Oral Daily  . insulin aspart  0-20 Units Subcutaneous TID WC  . lamoTRIgine  100 mg Oral TID  . metoprolol succinate  25 mg Oral Daily  . pantoprazole  40 mg Oral Daily  . pravastatin  20 mg Oral q1800  . senna  1 tablet Oral BID  . senna-docusate  1 tablet Oral BID  . sodium chloride  3 mL Intravenous Q12H   Continuous Infusions: . sodium chloride 75 mL/hr at 11/18/15 0344    Time spent on care of this patient: 35 min   [place], MD 11/18/2015, 7:38 PM  LOS: 2 days   [facility] Office  [phone] Pager - Text Page per [email or web address] If 7PM-7AM, please contact night-coverage [email or web address]

## 2015-11-18 NOTE — Clinical Social Work Placement (Signed)
   CLINICAL SOCIAL WORK PLACEMENT  NOTE  Date:  11/18/2015  Patient Details  Name: [name] MRN: [id number] Date of Birth: Aug 11, 1932  Clinical Social Work is seeking post-discharge placement for this patient at the [place] level of care (*CSW will initial, date and re-position this form in  chart as items are completed):  Yes   Patient/family provided with [place] Work Department's list of facilities offering this level of care within the geographic area requested by the patient (or if unable, by the patient's family).  Yes   Patient/family informed of their freedom to choose among providers that offer the needed level of care, that participate in Medicare, Medicaid or managed care program needed by the patient, have an available bed and are willing to accept the patient.  Yes   Patient/family informed of [place]'s ownership interest in [facility] and [facility], as well as of the fact that they are under no obligation to receive care at these facilities.  PASRR submitted to EDS on 11/18/15     PASRR number received on 11/18/15     Existing PASRR number confirmed on       FL2 transmitted to all facilities in geographic area requested by pt/family on 11/18/15     FL2 transmitted to all facilities within larger geographic area on       Patient informed that his/her managed care company has contracts with or will negotiate with certain facilities, including the following:            Patient/family informed of bed offers received.  Patient chooses bed at       Physician recommends and patient chooses bed at      Patient to be transferred to   on  .  Patient to be transferred to facility by       Patient family notified on   of transfer.  Name of family member notified:        PHYSICIAN Please sign FL2     Additional Comment:    _______________________________________________ Ross Ludwig, LCSWA 11/18/2015,  10:16 PM

## 2015-11-18 NOTE — Progress Notes (Signed)
   Subjective:  Patient reports pain as mild to moderate.  No c/o.  Objective:   VITALS:   Filed Vitals:   11/17/15 0447 11/17/15 1500 11/17/15 2226 11/18/15 0408  BP: 114/49 136/54 134/48 129/47  Pulse: 78 92 91 91  Temp: 98.3 F (36.8 C) 99.4 F (37.4 C) 98.6 F (37 C) 98.4 F (36.9 C)  TempSrc: Oral  Oral   Resp: 16 18 16 17   SpO2: 98% 96% 91% 92%    ABD soft Sensation intact distally Intact pulses distally Dorsiflexion/Plantar flexion intact Incision: dressing C/D/I Compartment soft No hematoma   Lab Results  Component Value Date   WBC 12.4* 11/17/2015   HGB 8.0* 11/17/2015   HCT 25.7* 11/17/2015   MCV 85.9 11/17/2015   PLT 213 11/17/2015   BMET    Component Value Date/Time   NA 134* 11/17/2015 1042   NA 139 04/02/2015 1122   K 5.5* 11/17/2015 1042   K 5.3* 04/02/2015 1122   CL 104 11/17/2015 1042   CL 104 02/21/2013 1026   CO2 24 11/17/2015 1042   CO2 24 04/02/2015 1122   GLUCOSE 170* 11/17/2015 1042   GLUCOSE 192* 04/02/2015 1122   GLUCOSE 133* 02/21/2013 1026   BUN 20 11/17/2015 1042   BUN 27.0* 04/02/2015 1122   CREATININE 1.87* 11/17/2015 1042   CREATININE 1.59* 08/14/2015 1425   CREATININE 1.9* 04/02/2015 1122   CREATININE 0.89 07/01/2010 1044   CALCIUM 7.9* 11/17/2015 1042   CALCIUM 8.6 04/02/2015 1122   GFRNONAA 32* 11/17/2015 1042   GFRNONAA 40* 08/14/2015 1425   GFRAA 37* 11/17/2015 1042   GFRAA 46* 08/14/2015 1425     Assessment/Plan: 2 Days Post-Op   Principal Problem:   Closed right hip fracture (HCC) Active Problems:   DM type 2 (diabetes mellitus, type 2) (HCC)   Hyperlipidemia   Essential hypertension   CAD, NATIVE VESSEL   BPH (benign prostatic hyperplasia)   Anemia of chronic disease   Fracture, intertrochanteric, right femur (HCC)   Type 2 diabetes mellitus with HbA1C goal below 7.5    WBAT with walker DVT ppx: ASA 325mg  PO BID x4 weeks, TEDs, SCDs PO pain control PT/OT ABLA on chronic anemia: hgb pending  this am DIspo: D/C planning    Jeydan Barner, Horald Pollen 11/18/2015, 7:44 AM   Rod Can, MD Cell [phone]

## 2015-11-18 NOTE — Evaluation (Signed)
Occupational Therapy Evaluation Patient Details Name: [name] MRN: [id number] DOB: [birth year] Today's Date: 11/18/2015    History of Present Illness Pt admitted after fall at home with right femur fx s/p IM nail. PMHx:DM, CVA, HTN   Clinical Impression   Pt reports he was independent with ADLs PTA. Currently pt is overall max +2 for functional transfers and LB ADLs. Pt planning to d/c to SNF for further rehab prior to returning home. Pt would benefit from continued skilled OT in order to maximize independence and safety with LB ADLs and toilet transfers.     Follow Up Recommendations  SNF;Supervision/Assistance - 24 hour    Equipment Recommendations  Other (comment) (TBD at next venue)    Recommendations for Other Services       Precautions / Restrictions Precautions Precautions: Fall Restrictions Weight Bearing Restrictions: Yes RLE Weight Bearing: Weight bearing as tolerated      Mobility Bed Mobility Overal bed mobility: Needs Assistance;+2 for physical assistance Bed Mobility: Supine to Sit Rolling: Total assist;+2 for physical assistance   Supine to sit: Max assist;+2 for physical assistance     General bed mobility comments: Max assist to help move R LE, trunk elevation, helicopter pivot with pad to EOB with increased time. Max cues for sequencing and hand placement, and for reciprocal scooting.   Transfers Overall transfer level: Needs assistance Equipment used: Rolling walker (2 wheeled) Transfers: Sit to/from [facility] Sit to Stand: Max assist;+2 physical assistance;From elevated surface Stand pivot transfers: Max assist;From elevated surface;+2 physical assistance       General transfer comment: Max assist for powering up and steadiness upon standing, with use of bed pad to extend hips. Blocking both knees due to moderate buckling. Pt maintained flexed posture throughout needing max cues for trunk extension, hand placement, and  sequencing. Third person additionally for safety and equipment maintainence.    Balance Overall balance assessment: Needs assistance Sitting-balance support: Single extremity supported;Feet supported Sitting balance-Leahy Scale: Fair Sitting balance - Comments: Pt maintained flexed trunk posture when sitting and L lateral lean Postural control: Left lateral lean Standing balance support: Bilateral upper extremity supported Standing balance-Leahy Scale: Zero Standing balance comment: max assist +2 to maintain standing balance                            ADL Overall ADL's : Needs assistance/impaired Eating/Feeding: Set up;Sitting   Grooming: Minimal assistance;Sitting       Lower Body Bathing: Maximal assistance;+2 for physical assistance;Sit to/from stand       Lower Body Dressing: Maximal assistance;+2 for safety/equipment;Sit to/from stand Lower Body Dressing Details (indicate cue type and reason): Pt able to reach ankle of L LE, unable to reach R LE Toilet Transfer: Maximal assistance;+2 for physical assistance;Stand-pivot;BSC;RW Toilet Transfer Details (indicate cue type and reason): Simulated by transfer from EOB to chair. Toileting- Clothing Manipulation and Hygiene: Maximal assistance;+2 for physical assistance;Sit to/from stand         General ADL Comments: No family present for OT eval. Eduacted on safety with RW; pt verbalized understanding.     Vision     Perception     Praxis      Pertinent Vitals/Pain Pain Assessment: 0-10 Pain Score: 4  Pain Location: R hip Pain Descriptors / Indicators: Aching Pain Intervention(s): Limited activity within patient's tolerance;Monitored during session;Premedicated before session;Repositioned;Ice applied     Hand Dominance     Extremity/Trunk Assessment Upper Extremity Assessment  Upper Extremity Assessment: RUE deficits/detail RUE Deficits / Details: limited shoulder flexion    Lower Extremity  Assessment Lower Extremity Assessment: Defer to PT evaluation   Cervical / Trunk Assessment Cervical / Trunk Assessment: Kyphotic   Communication Communication Communication: No difficulties   Cognition Arousal/Alertness: Awake/alert Behavior During Therapy: Flat affect Overall Cognitive Status: Impaired/Different from baseline Area of Impairment: Memory;Following commands;Safety/judgement;Problem solving;Orientation     Memory: Decreased short-term memory Following Commands: Follows one step commands inconsistently;Follows one step commands with increased time Safety/Judgement: Decreased awareness of deficits;Decreased awareness of safety   Problem Solving: Slow processing;Decreased initiation;Difficulty sequencing;Requires verbal cues;Requires tactile cues General Comments: Pt was able to remember therapists from yesterday's tx but unable to recall what was performed during yesterday's tx   General Comments       Exercises       Shoulder Instructions      Home Living Family/patient expects to be discharged to:: Skilled nursing facility                                        Prior Functioning/Environment Level of Independence: Independent             OT Diagnosis: Generalized weakness;Acute pain   OT Problem List: Decreased strength;Impaired balance (sitting and/or standing);Decreased activity tolerance;Decreased safety awareness;Decreased knowledge of use of DME or AE;Decreased knowledge of precautions;Pain   OT Treatment/Interventions: Self-care/ADL training;DME and/or AE instruction;Patient/family education    OT Goals(Current goals can be found in the care plan section) Acute Rehab OT Goals Patient Stated Goal: to not hurt OT Goal Formulation: With patient Time For Goal Achievement: 12/02/15 Potential to Achieve Goals: Good ADL Goals Pt Will Perform Grooming: with min assist;standing Pt Will Perform Lower Body Bathing: with min assist;sit  to/from stand (with or without AE) Pt Will Perform Lower Body Dressing: with min assist;sit to/from stand (with or without AE) Pt Will Transfer to Toilet: with min assist;stand pivot transfer;bedside commode Pt Will Perform Toileting - Clothing Manipulation and hygiene: with min assist;sit to/from stand  OT Frequency: Min 2X/week   Barriers to D/C:            Co-evaluation PT/OT/SLP Co-Evaluation/Treatment: Yes Reason for Co-Treatment: For patient/therapist safety;Necessary to address cognition/behavior during functional activity PT goals addressed during session: Mobility/safety with mobility;Proper use of DME;Strengthening/ROM OT goals addressed during session: ADL's and self-care      End of Session Equipment Utilized During Treatment: Gait belt;Rolling walker Nurse Communication: Other (comment) (PT communicated need for lift equipment, mobility status )  Activity Tolerance: Patient limited by pain Patient left: in chair;with call bell/phone within reach   Time: [id number] OT Time Calculation (min): 25 min Charges:  OT General Charges $OT Visit: 1 Procedure OT Evaluation $Initial OT Evaluation Tier I: 1 Procedure G-Codes:      Binnie Kand M.S., OTR/L Pager: [phone]  11/18/2015, 1:14 PM

## 2015-11-18 NOTE — Progress Notes (Signed)
Patient's daughter mentioned that patient was scratching his left thumb and c/o it itching.  I noticed no redness or swelling.  Itching is localized to that one area.  Cleansed and lotion left hand.  Patient is currently asleep.

## 2015-11-18 NOTE — Clinical Social Work Note (Signed)
Clinical Social Work Assessment  Patient Details  Name: [name] MRN: [id number] Date of Birth: Jun 24, 1932  Date of referral:  11/18/15               Reason for consult:  Facility Placement                Permission sought to share information with:  Facility Sport and exercise psychologist, Family Supports Permission granted to share information::  Yes, Verbal Permission Granted  Name::     [name] Patient's daughter  Agency::  SNF admissions  Relationship::     Contact Information:     Housing/Transportation Living arrangements for the past 2 months:  Single Family Home Source of Information:  Patient, Adult Children Patient Interpreter Needed:  None Criminal Activity/Legal Involvement Pertinent to Current Situation/Hospitalization:  No - Comment as needed Significant Relationships:  Adult Children Lives with:    Do you feel safe going back to the place where you live?  Yes Need for family participation in patient care:  Yes (Comment) (Patient requests his daughter help with decision making.)  Care giving concerns:  Patient and daughter feel he needs some short term rehab in order to return back home.   Social Worker assessment / plan:  Patient is 81 year old male who is alert and oriented x4.  Patient is talkative and his daughter was at bedside.  Patient and his daughter state he has never been to rehab, CSW explained process and what to expect at [facility] for short term rehab.  Patient's daughter asked about how insurance pays for SNF stay, and CSW explained what his insurance should cover.  Patient and daughter expressed they had registered at [facility] and spoke to admissions worker who stated they can take patient once referral has been made.  Patient and his daughter did not express any other questions.   Employment status:  Retired Nurse, adult PT Recommendations:  [place] / Referral to community resources:  [place]  Patient/Family's Response to care: Patient and daughter agreeable to going to SNF for short term rehab.  Patient/Family's Understanding of and Emotional Response to Diagnosis, Current Treatment, and Prognosis:  Patient and daughter are aware of current treatment plan and prognosis.  Emotional Assessment Appearance:  Appears stated age Attitude/Demeanor/Rapport:    Affect (typically observed):  Appropriate, Calm, Stable Orientation:  Oriented to Self, Oriented to Place, Oriented to  Time, Oriented to Situation Alcohol / Substance use:  Not Applicable Psych involvement (Current and /or in the community):  No (Comment)  Discharge Needs  Concerns to be addressed:  No discharge needs identified Readmission within the last 30 days:  No Current discharge risk:  None Barriers to Discharge:  No Barriers Identified   Ross Ludwig, LCSWA 11/18/2015, 10:07 PM

## 2015-11-18 NOTE — Telephone Encounter (Signed)
Left voicemail with message from Dr. Everlene Farrier.

## 2015-11-18 NOTE — Progress Notes (Signed)
Physical Therapy Treatment Patient Details Name: [name] MRN: [id number] DOB: 1932-10-15 Today's Date: 11/18/2015    History of Present Illness Pt admitted after fall at home with right femur fx s/p IM nail. PMHx:DM, CVA, HTN    PT Comments    Pt continued to appear agitated but was more willing to perform therapy today. He continues to be self-limiting from pain but showed improved R LE AROM. Pt required max assist +2 throughout bed mobility and transfer so he was set up so [place] could be used to return to bed with nursing staff. Will continue to follow to improve functional mobility and activity tolerance.    Follow Up Recommendations  SNF;Supervision/Assistance - 24 hour     Equipment Recommendations  None recommended by PT    Recommendations for Other Services       Precautions / Restrictions Precautions Precautions: Fall Restrictions Weight Bearing Restrictions: Yes RLE Weight Bearing: Weight bearing as tolerated    Mobility  Bed Mobility Overal bed mobility: Needs Assistance;+2 for physical assistance Bed Mobility: Supine to Sit     Supine to sit: Max assist;+2 for physical assistance     General bed mobility comments: Max assist to help move R LE, trunk elevation, helicopter pivot with pad to EOB with increased time. Max cues for sequencing and hand placement, and for reciprocal scooting.   Transfers Overall transfer level: Needs assistance Equipment used: Rolling walker (2 wheeled) Transfers: Stand Pivot Transfers;Sit to/from Stand Sit to Stand: Max assist;+2 physical assistance;From elevated surface Stand pivot transfers: Max assist;From elevated surface;+2 physical assistance       General transfer comment: Max assist for powering up and steadiness upon standing, with use of bed pad to extend hips. Blocking both knees due to moderate buckling. Pt maintained flexed posture throughout needing max cues for trunk extension, hand placement, and  sequencing. Third person additionally for safety and equipment maintainence.  Ambulation/Gait                 Stairs            Wheelchair Mobility    Modified Rankin (Stroke Patients Only)       Balance Overall balance assessment: Needs assistance Sitting-balance support: Feet supported Sitting balance-Leahy Scale: Fair Sitting balance - Comments: Pt maintained flexed trunk posture when sitting and L lateral lean Postural control: Left lateral lean Standing balance support: Bilateral upper extremity supported Standing balance-Leahy Scale: Zero Standing balance comment: max assist +2 to maintain standing balance                    Cognition Arousal/Alertness: Awake/alert Behavior During Therapy: Agitated;Flat affect (better behavior than on 12/5 Tx ) Overall Cognitive Status: Impaired/Different from baseline Area of Impairment: Memory;Following commands;Safety/judgement;Problem solving;Orientation     Memory: Decreased short-term memory Following Commands: Follows one step commands inconsistently;Follows one step commands with increased time Safety/Judgement: Decreased awareness of deficits;Decreased awareness of safety   Problem Solving: Slow processing;Decreased initiation;Difficulty sequencing;Requires verbal cues;Requires tactile cues General Comments: Pt was able to remember therapists from yesterday's tx but unable to recall what was performed during yesterday's tx    Exercises General Exercises - Lower Extremity Quad Sets: AROM;10 reps;Right;Supine Heel Slides: AROM;Right;5 reps;Supine (0-45 degrees)    General Comments        Pertinent Vitals/Pain Pain Assessment: 0-10 Pain Score: 4  Pain Location: R hip  Pain Descriptors / Indicators: Aching Pain Intervention(s): Limited activity within patient's tolerance;Monitored during session;Premedicated before session;Repositioned;Ice applied  Home Living                      Prior  Function            PT Goals (current goals can now be found in the care plan section) Acute Rehab PT Goals Patient Stated Goal: to not hurt PT Goal Formulation: With patient/family Time For Goal Achievement: 12/01/15 Potential to Achieve Goals: Fair Progress towards PT goals: Progressing toward goals    Frequency  Min 3X/week    PT Plan Current plan remains appropriate    Co-evaluation PT/OT/SLP Co-Evaluation/Treatment: Yes Reason for Co-Treatment: For patient/therapist safety;Necessary to address cognition/behavior during functional activity PT goals addressed during session: Mobility/safety with mobility;Proper use of DME;Strengthening/ROM       End of Session Equipment Utilized During Treatment: Gait belt Activity Tolerance: Patient limited by pain Patient left: in chair;with call bell/phone within reach;with family/visitor present     Time: [id number] PT Time Calculation (min) (ACUTE ONLY): 24 min  Charges:  $Therapeutic Activity: 8-22 mins                    G CodesHaynes Bast 2015-12-16, 12:45 PM Haynes Bast, SPT 12/16/15 12:45 PM

## 2015-11-18 NOTE — NC FL2 (Signed)
[place] LEVEL OF CARE SCREENING TOOL     IDENTIFICATION  Patient Name: [name] Birthdate: [birth year] Sex: male Admission Date (Current Location): 11/15/2015  [facility] and [place] Number: Herbalist and Address:  [facility], [place] [address] [place], [place] [address]      Provider Number: [id number]  Attending Physician Name and Address:  Debbe Odea, MD  Relative Name and Phone Number:  Nygel Farrior, Daughter [phone] or [phone]    Current Level of Care: Hospital Recommended Level of Care: [place] Prior Approval Number:    Date Approved/Denied:   PASRR Number: [id number]  Discharge Plan: SNF    Current Diagnoses: Patient Active Problem List   Diagnosis Date Noted  . Closed right hip fracture ([place]) 11/16/2015  . Fracture, intertrochanteric, right femur ([place]) 11/16/2015  . Type 2 diabetes mellitus with HbA1C goal below 7.5   . Chronic kidney disease 08/15/2015  . Dementia 03/28/2015  . Anemia of chronic disease 01/14/2015  . Seizures ([place]) 01/14/2015  . Preop cardiovascular exam 10/14/2014  . History of rectal cancer 03/02/2013  . Unspecified deficiency anemia 08/18/2012  . Special screening for malignant neoplasms, colon 12/22/2011  . Leucopenia 12/22/2011  . Personal history of malignant neoplasm of rectum, rectosigmoid junction, and anus 12/22/2011  . S/P left colectomy 12/22/2011  . BPH (benign prostatic hyperplasia)   . 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 Leukopenia   . Cardiomyopathy ([place])   . Preoperative evaluation to rule out surgical contraindication 03/24/2011  . ADENOCARCINOMA, SIGMOID COLON 09/07/2010  . DM type 2 (diabetes mellitus, type 2) ([place]) 07/09/2010  . ANEMIA, HX OF 07/09/2010  . Hyperlipidemia 09/20/2009  . Essential hypertension 09/20/2009  . CAD, NATIVE VESSEL 09/20/2009    Orientation ACTIVITIES/SOCIAL BLADDER RESPIRATION    Self, Time,  Situation, Place    Incontinent O2 (As needed) (2 L per minute)  BEHAVIORAL SYMPTOMS/MOOD NEUROLOGICAL BOWEL NUTRITION STATUS      Continent Diet (Regular)  PHYSICIAN VISITS COMMUNICATION OF NEEDS Height & Weight Skin    Verbally   217 lbs. Surgical wounds          AMBULATORY STATUS RESPIRATION    Supervision limited O2 (As needed) (2 L per minute)      Personal Care Assistance Level of Assistance  Bathing, Dressing Bathing Assistance: Limited assistance   Dressing Assistance: Limited assistance      Functional Limitations Info  Hearing   Hearing Info: Impaired         SPECIAL CARE FACTORS FREQUENCY  PT (By licensed PT)     PT Frequency: 5x             Additional Factors Info  Allergies, Code Status Code Status Info: Full Code Allergies Info: NKA           Current Medications (11/18/2015):  This is the current hospital active medication list Current Facility-Administered Medications  Medication Dose Route Frequency Provider Last Rate Last Dose  . 0.9 %  sodium chloride infusion   Intravenous Continuous Rod Can, MD 75 mL/hr at 11/18/15 0344    . acetaminophen (TYLENOL) tablet 650 mg  650 mg Oral Q6H PRN Rod Can, MD   650 mg at 11/18/15 [id number]   Or  . acetaminophen (TYLENOL) suppository 650 mg  650 mg Rectal Q6H PRN Rod Can, MD      . amLODipine (NORVASC) tablet 5 mg  5 mg Oral Daily Reubin Milan, MD   5  mg at 11/18/15 1020  . aspirin EC tablet 325 mg  325 mg Oral BID PC Rod Can, MD   325 mg at 11/18/15 1739  . bisacodyl (DULCOLAX) suppository 10 mg  10 mg Rectal Once Debbe Odea, MD      . docusate sodium (COLACE) capsule 100 mg  100 mg Oral BID Rod Can, MD   100 mg at 11/18/15 1021  . donepezil (ARICEPT) tablet 10 mg  10 mg Oral Daily Reubin Milan, MD   10 mg at 11/18/15 1020  . doxazosin (CARDURA) tablet 4 mg  4 mg Oral Daily Reubin Milan, MD   4 mg at 11/18/15 1021  . feeding supplement (GLUCERNA SHAKE)  (GLUCERNA SHAKE) liquid 237 mL  237 mL Oral TID BM Dale Carter, RD   237 mL at 11/18/15 1400  . finasteride (PROSCAR) tablet 5 mg  5 mg Oral Daily Reubin Milan, MD   5 mg at 11/18/15 1021  . HYDROcodone-acetaminophen (NORCO/VICODIN) 5-325 MG per tablet 1-2 tablet  1-2 tablet Oral Q6H PRN Rod Can, MD   2 tablet at 11/18/15 1505  . insulin aspart (novoLOG) injection 0-20 Units  0-20 Units Subcutaneous TID WC Reubin Milan, MD   4 Units at 11/18/15 1740  . lamoTRIgine (LAMICTAL) tablet 100 mg  100 mg Oral TID Reubin Milan, MD   100 mg at 11/18/15 1739  . menthol-cetylpyridinium (CEPACOL) lozenge 3 mg  1 lozenge Oral PRN Rod Can, MD       Or  . phenol (CHLORASEPTIC) mouth spray 1 spray  1 spray Mouth/Throat PRN Rod Can, MD      . methocarbamol (ROBAXIN) 500 mg in dextrose 5 % 50 mL IVPB  500 mg Intravenous Q6H PRN Rod Can, MD   500 mg at 11/16/15 2122  . methocarbamol (ROBAXIN) tablet 500 mg  500 mg Oral Q6H PRN Rod Can, MD   500 mg at 11/18/15 1505  . metoCLOPramide (REGLAN) tablet 5-10 mg  5-10 mg Oral Q8H PRN Rod Can, MD       Or  . metoCLOPramide (REGLAN) injection 5-10 mg  5-10 mg Intravenous Q8H PRN Rod Can, MD      . metoprolol succinate (TOPROL-XL) 24 hr tablet 25 mg  25 mg Oral Daily Reubin Milan, MD   25 mg at 11/18/15 1020  . morphine 2 MG/ML injection 0.5 mg  0.5 mg Intravenous Q2H PRN Rod Can, MD   0.5 mg at 11/16/15 1320  . nitroGLYCERIN (NITROSTAT) SL tablet 0.4 mg  0.4 mg Sublingual Q5 min PRN Reubin Milan, MD      . ondansetron [facility]) tablet 4 mg  4 mg Oral Q6H PRN Rod Can, MD       Or  . ondansetron [facility]) injection 4 mg  4 mg Intravenous Q6H PRN Rod Can, MD      . pantoprazole (PROTONIX) EC tablet 40 mg  40 mg Oral Daily Reubin Milan, MD   40 mg at 11/18/15 1021  . pravastatin (PRAVACHOL) tablet 20 mg  20 mg Oral q1800 Reubin Milan, MD   20 mg at 11/18/15 1739   . senna (SENOKOT) tablet 8.6 mg  1 tablet Oral BID Rod Can, MD   8.6 mg at 11/18/15 1020  . senna-docusate (Senokot-S) tablet 1 tablet  1 tablet Oral BID Debbe Odea, MD   1 tablet at 11/18/15 1100  . sodium chloride 0.9 % injection 3 mL  3 mL Intravenous Q12H  Reubin Milan, MD   3 mL at 11/16/15 2122     Discharge Medications: Please see discharge summary for a list of discharge medications.  Relevant Imaging Results:  Relevant Lab Results:  Recent Labs    Additional Information    Fayette Gasner, Jones Broom, LCSWA

## 2015-11-19 ENCOUNTER — Other Ambulatory Visit: Payer: Self-pay | Admitting: Emergency Medicine

## 2015-11-19 DIAGNOSIS — M21251 Flexion deformity, right hip: Secondary | ICD-10-CM | POA: Diagnosis not present

## 2015-11-19 DIAGNOSIS — D638 Anemia in other chronic diseases classified elsewhere: Secondary | ICD-10-CM | POA: Diagnosis not present

## 2015-11-19 DIAGNOSIS — M25551 Pain in right hip: Secondary | ICD-10-CM | POA: Diagnosis not present

## 2015-11-19 DIAGNOSIS — M6281 Muscle weakness (generalized): Secondary | ICD-10-CM | POA: Diagnosis not present

## 2015-11-19 DIAGNOSIS — M79673 Pain in unspecified foot: Secondary | ICD-10-CM | POA: Diagnosis not present

## 2015-11-19 DIAGNOSIS — K4090 Unilateral inguinal hernia, without obstruction or gangrene, not specified as recurrent: Secondary | ICD-10-CM | POA: Diagnosis not present

## 2015-11-19 DIAGNOSIS — F0390 Unspecified dementia without behavioral disturbance: Secondary | ICD-10-CM | POA: Diagnosis not present

## 2015-11-19 DIAGNOSIS — S72141D Displaced intertrochanteric fracture of right femur, subsequent encounter for closed fracture with routine healing: Secondary | ICD-10-CM | POA: Diagnosis not present

## 2015-11-19 DIAGNOSIS — N400 Enlarged prostate without lower urinary tract symptoms: Secondary | ICD-10-CM | POA: Diagnosis not present

## 2015-11-19 DIAGNOSIS — E1142 Type 2 diabetes mellitus with diabetic polyneuropathy: Secondary | ICD-10-CM | POA: Diagnosis not present

## 2015-11-19 DIAGNOSIS — Z4789 Encounter for other orthopedic aftercare: Secondary | ICD-10-CM | POA: Diagnosis not present

## 2015-11-19 DIAGNOSIS — E119 Type 2 diabetes mellitus without complications: Secondary | ICD-10-CM | POA: Diagnosis not present

## 2015-11-19 DIAGNOSIS — R569 Unspecified convulsions: Secondary | ICD-10-CM | POA: Diagnosis not present

## 2015-11-19 DIAGNOSIS — E1122 Type 2 diabetes mellitus with diabetic chronic kidney disease: Secondary | ICD-10-CM | POA: Diagnosis not present

## 2015-11-19 DIAGNOSIS — S72001A Fracture of unspecified part of neck of right femur, initial encounter for closed fracture: Secondary | ICD-10-CM | POA: Diagnosis not present

## 2015-11-19 DIAGNOSIS — S72144D Nondisplaced intertrochanteric fracture of right femur, subsequent encounter for closed fracture with routine healing: Secondary | ICD-10-CM | POA: Diagnosis not present

## 2015-11-19 DIAGNOSIS — N183 Chronic kidney disease, stage 3 (moderate): Secondary | ICD-10-CM | POA: Diagnosis not present

## 2015-11-19 DIAGNOSIS — M25559 Pain in unspecified hip: Secondary | ICD-10-CM | POA: Diagnosis not present

## 2015-11-19 DIAGNOSIS — R489 Unspecified symbolic dysfunctions: Secondary | ICD-10-CM | POA: Diagnosis not present

## 2015-11-19 DIAGNOSIS — I2510 Atherosclerotic heart disease of native coronary artery without angina pectoris: Secondary | ICD-10-CM | POA: Diagnosis not present

## 2015-11-19 DIAGNOSIS — R2681 Unsteadiness on feet: Secondary | ICD-10-CM | POA: Diagnosis not present

## 2015-11-19 DIAGNOSIS — S72141S Displaced intertrochanteric fracture of right femur, sequela: Secondary | ICD-10-CM | POA: Diagnosis not present

## 2015-11-19 DIAGNOSIS — R278 Other lack of coordination: Secondary | ICD-10-CM | POA: Diagnosis not present

## 2015-11-19 DIAGNOSIS — D61818 Other pancytopenia: Secondary | ICD-10-CM | POA: Diagnosis not present

## 2015-11-19 DIAGNOSIS — E785 Hyperlipidemia, unspecified: Secondary | ICD-10-CM | POA: Diagnosis not present

## 2015-11-19 DIAGNOSIS — S72001S Fracture of unspecified part of neck of right femur, sequela: Secondary | ICD-10-CM | POA: Diagnosis not present

## 2015-11-19 DIAGNOSIS — B351 Tinea unguium: Secondary | ICD-10-CM | POA: Diagnosis not present

## 2015-11-19 DIAGNOSIS — I10 Essential (primary) hypertension: Secondary | ICD-10-CM | POA: Diagnosis not present

## 2015-11-19 DIAGNOSIS — D500 Iron deficiency anemia secondary to blood loss (chronic): Secondary | ICD-10-CM | POA: Diagnosis not present

## 2015-11-19 LAB — BASIC METABOLIC PANEL
Anion gap: 6 (ref 5–15)
BUN: 16 mg/dL (ref 6–20)
CALCIUM: 8.1 mg/dL — AB (ref 8.9–10.3)
CO2: 28 mmol/L (ref 22–32)
CREATININE: 1.42 mg/dL — AB (ref 0.61–1.24)
Chloride: 99 mmol/L — ABNORMAL LOW (ref 101–111)
GFR calc Af Amer: 52 mL/min — ABNORMAL LOW (ref >60)
GFR calc non Af Amer: 45 mL/min — ABNORMAL LOW (ref >60)
GLUCOSE: 181 mg/dL — AB (ref 65–99)
Potassium: 5.1 mmol/L (ref 3.5–5.1)
Sodium: 133 mmol/L — ABNORMAL LOW (ref 135–145)

## 2015-11-19 LAB — GLUCOSE, CAPILLARY
Glucose-Capillary: 171 mg/dL — ABNORMAL HIGH (ref 65–99)
Glucose-Capillary: 182 mg/dL — ABNORMAL HIGH (ref 65–99)

## 2015-11-19 LAB — CBC
HEMATOCRIT: 24 % — AB (ref 39.0–52.0)
Hemoglobin: 7.4 g/dL — ABNORMAL LOW (ref 13.0–17.0)
MCH: 26.1 pg (ref 26.0–34.0)
MCHC: 30.8 g/dL (ref 30.0–36.0)
MCV: 84.5 fL (ref 78.0–100.0)
Platelets: 181 10*3/uL (ref 150–400)
RBC: 2.84 MIL/uL — ABNORMAL LOW (ref 4.22–5.81)
RDW: 15.1 % (ref 11.5–15.5)
WBC: 6.7 10*3/uL (ref 4.0–10.5)

## 2015-11-19 MED ORDER — DOCUSATE SODIUM 100 MG PO CAPS: 100.0000 mg | ORAL_CAPSULE | Freq: Two times a day (BID) | ORAL | Status: DC

## 2015-11-19 MED ORDER — SENNA 8.6 MG PO TABS: 1.0000 | ORAL_TABLET | Freq: Two times a day (BID) | ORAL | Status: DC

## 2015-11-19 NOTE — Progress Notes (Signed)
Called report to [facility].Gave report to RN  

## 2015-11-19 NOTE — Discharge Summary (Signed)
Physician Discharge Summary  VANDAN KUSH [id number] DOB: [birth year] DOA: 11/15/2015  PCP: [name], MD  Admit date: 11/15/2015 Discharge date: 11/19/2015  Recommendations for Outpatient Follow-up:  1. Pt will need to follow up with PCP in 1-2 post discharge with the doctor at the facility of PCP 2. Please obtain BMP to evaluate electrolytes and kidney function 3. Please also check CBC to evaluate Hg and Hct levels, may need transfusion if Hg < 7 4. Please note that aspirin dose was increased for DVT prophylaxis and after completion, can be changed back to initial dose   Discharge Diagnoses:  Principal Problem:   Closed right hip fracture [facility])  Discharge Condition: Stable  Diet recommendation: Heart healthy diet discussed in details   History of present illness:  81 y.o. male with PMH of CAD, DM2, HLD, HTN and rectal cancer in the past who presented for a mechanical fall at home. Sustained right hip fracture and underwent hip repair.   Hospital Course:  Principal Problem:  Closed right hip fracture  - status post hip repair  - doing better this AM but still rates pain 5/10 with exertion - continue analgesia as needed  - pt will go to SNF upon discharge   Active Problems:  DM type 2 (diabetes mellitus, type 2)  - cont SSI per previous home regimen   Hyperkalemia - resolved but not clear etiology identified   Hyperlipidemia - cont pravastatin  Essential hypertension - Continue amlodipine 5 mg by mouth daily, metoprolol 25 mg by mouth daily - on furosemide 20 mg by mouth daily- ECHO reveals mild LVH bu no underlying systolic or diastolic dysfuction  AKI on CKD3 - Cr improving - will need to have this monitored in an outpatient setting   CAD, NATIVE VESSEL - cont Aspirin and B blocker  BPH (benign prostatic hyperplasia) - cont Proscar and Cardura  Post op acute blood loss anemia - post op drop noted- cont to follow and transfuse if Hb <  7  Procedures/Studies: Chest Portable 1 View  11/16/2015  CLINICAL DATA:  Pre-op for right hip surgery today. Right hip fracture. EXAM: PORTABLE CHEST 1 VIEW COMPARISON:  Radiographs 12/03/2011.  Chest CT 05/01/2011. FINDINGS: 0630 hours. There are lower lung volumes with mildly increased bibasilar atelectasis. There is chronic elevation of the left hemidiaphragm which appears unchanged. The heart size and mediastinal contours are stable. No edema, pneumothorax or significant pleural effusion. IMPRESSION: Mildly increased bibasilar atelectasis related to suboptimal inspiration. No other significant changes. Electronically Signed   By: Richardean Sale M.D.   On: 11/16/2015 08:55   Dg Hip Port Unilat With Pelvis 1v Right  11/16/2015  CLINICAL DATA:  Surgery follow-up. EXAM: DG HIP (WITH OR WITHOUT PELVIS) 1V PORT RIGHT COMPARISON:  Fluoroscopic intraoperative images dated 11/16/2015 and preoperative plain film of the right hip dated 11/15/2015. FINDINGS: Rod and screw fixation hardware traversing the right femoral neck fracture site appears intact and stable in alignment compared to the intraoperative fluoroscopic images. No surgical complicating features seen. Osseous alignment is stable. Left hip arthroplasty hardware also appear stable in alignment. Soft tissues about the pelvis and right hip are unremarkable. IMPRESSION: Status post internal fixation of the intertrochanteric right femur fracture. Hardware appears intact and well aligned. No surgical complicating feature. Electronically Signed   By: Franki Cabot M.D.   On: 11/16/2015 13:59   Dg Hip Operative Unilat With Pelvis Right  11/16/2015  CLINICAL DATA:  Right hip nailing for intertrochanteric fracture.  EXAM: OPERATIVE RIGHT HIP (WITH PELVIS IF PERFORMED) 3 VIEWS TECHNIQUE: Fluoroscopic spot image(s) were submitted for interpretation post-operatively. COMPARISON:  Radiographs 11/15/2015. FINDINGS: Three views demonstrate fixation of the  intertrochanteric right femur fracture with a short segment intramedullary nail secured by a distal interlocking screw. The hardware appears well position. There is no dislocation or displacement of the fracture. IMPRESSION: No demonstrated complication following ORIF of intertrochanteric right femur fracture. Electronically Signed   By: Richardean Sale M.D.   On: 11/16/2015 11:30   Dg Hip Unilat With Pelvis 2-3 Views Right  11/15/2015  CLINICAL DATA:  Right lateral hip pain after fall. EXAM: DG HIP (WITH OR WITHOUT PELVIS) 2-3V RIGHT COMPARISON:  None. FINDINGS: There is an intertrochanteric right hip fracture. There is no dislocation. There is no bone lesion to suggest a pathologic basis for the fracture. IMPRESSION: Intertrochanteric right hip fracture. Electronically Signed   By: Andreas Newport M.D.   On: 11/15/2015 23:17   Discharge Exam: Filed Vitals:   11/19/15 0639 11/19/15 0833  BP: 164/61 148/59  Pulse: 86 99  Temp: 98.5 F (36.9 C)   Resp: 18    Filed Vitals:   11/18/15 1423 11/18/15 2240 11/19/15 0639 11/19/15 0833  BP: 128/51 126/52 164/61 148/59  Pulse: 67 79 86 99  Temp: 97.7 F (36.5 C) 98.1 F (36.7 C) 98.5 F (36.9 C)   TempSrc:  Oral Oral   Resp: 18 18 18    SpO2: 91% 92% 91%     General: Pt is alert, follows commands appropriately, not in acute distress Cardiovascular: Regular rate and rhythm, no rubs, no gallops Respiratory: Clear to auscultation bilaterally, no wheezing, no crackles, no rhonchi Abdominal: Soft, non tender, non distended, bowel sounds +, no guarding  Discharge Instructions  Discharge Instructions    Diet - low sodium heart healthy    Complete by:  As directed      Increase activity slowly    Complete by:  As directed             Medication List    STOP taking these medications        aspirin 81 MG tablet  Replaced by:  aspirin EC 325 MG tablet      TAKE these medications        amLODipine 5 MG tablet  Commonly known as:   NORVASC  TAKE 1 TABLET BY MOUTH EVERY DAY     aspirin EC 325 MG tablet  Take 1 tablet (325 mg total) by mouth 2 (two) times daily after a meal.     B-D INS SYR MICROFINE .5CC/28G 28G X 1/2" 0.5 ML Misc  Generic drug:  INSULIN SYRINGE .5CC/28G  See admin instructions.     B-D INS SYR MICROFINE .5CC/28G 28G X 1/2" 0.5 ML Misc  Generic drug:  INSULIN SYRINGE .5CC/28G  USE AS DIRECTED     docusate sodium 100 MG capsule  Commonly known as:  COLACE  Take 1 capsule (100 mg total) by mouth 2 (two) times daily.     donepezil 10 MG tablet  Commonly known as:  ARICEPT  Take 10 mg by mouth daily.     doxazosin 4 MG tablet  Commonly known as:  CARDURA  TAKE 1 TABLET BY MOUTH AT BEDTIME.     ECHINACEA HERB PO  Take 1 tablet by mouth daily.     finasteride 5 MG tablet  Commonly known as:  PROSCAR  TAKE 1 TABLET BY MOUTH EVERY DAY  furosemide 20 MG tablet  Commonly known as:  LASIX  TAKE 1 TABLET BY MOUTH EVERY DAY     glucose blood test strip  Commonly known as:  ONE TOUCH ULTRA TEST  USE TO CHECK BLOOD SUGAR 3 TIMES A DAY AS DIRECTED     HYDROcodone-acetaminophen 5-325 MG tablet  Commonly known as:  NORCO  Take 1-2 tablets by mouth every 6 (six) hours as needed for moderate pain.     insulin regular 100 units/mL injection  Commonly known as:  HUMULIN R  Check 30-60 minutes prior to each meal. Give sliding-scale insulin as instructed     JUICE PLUS FIBRE PO  Take 2 tablets by mouth 2 (two) times daily.     lamoTRIgine 100 MG tablet  Commonly known as:  LAMICTAL  Take 100 mg by mouth 3 (three) times daily.     metoprolol succinate 25 MG 24 hr tablet  Commonly known as:  TOPROL-XL  TAKE 1 TABLET BY MOUTH EVERY DAY.     nitroGLYCERIN 0.4 MG SL tablet  Commonly known as:  NITROSTAT  Place 1 tablet (0.4 mg total) under the tongue every 5 (five) minutes as needed for chest pain.     pravastatin 20 MG tablet  Commonly known as:  PRAVACHOL  TAKE 1 TABLET BY MOUTH EVERY  DAY     senna 8.6 MG Tabs tablet  Commonly known as:  SENOKOT  Take 1 tablet (8.6 mg total) by mouth 2 (two) times daily.           Follow-up Information    Follow up with Swinteck, Horald Pollen, MD. Schedule an appointment as soon as possible for a visit in 2 weeks.   Specialty:  Orthopedic Surgery   Why:  For wound re-check   Contact information:   [place] [address] [phone]       Follow up with [name], MD.   Specialty:  Family Medicine   Contact information:   [place] [id number] [phone]        The results of significant diagnostics from this hospitalization (including imaging, microbiology, ancillary and laboratory) are listed below for reference.     Microbiology: No results found for this or any previous visit (from the past 240 hour(s)).   Labs: Basic Metabolic Panel:  Recent Labs Lab 11/16/15 0008 11/16/15 1015 11/17/15 0347 11/17/15 1042 11/18/15 0835 11/19/15 0505  NA 136 140 136 134* 133* 133*  K 4.6 4.7 5.5* 5.5* 5.2* 5.1  CL 102  --  103 104 103 99*  CO2 28  --  27 24 26 28   GLUCOSE 177* 119* 171* 170* 210* 181*  BUN 18  --  19 20 15 16   CREATININE 1.43*  --  1.84* 1.87* 1.60* 1.42*  CALCIUM 8.4*  --  7.8* 7.9* 7.9* 8.1*   Liver Function Tests: CBC:  Recent Labs Lab 11/16/15 0008 11/16/15 1015 11/17/15 0347 11/18/15 0749 11/19/15 0505  WBC 10.1  --  12.4* 10.1 6.7  NEUTROABS 8.2*  --   --   --   --   HGB 10.5* 11.2* 8.0* 7.5* 7.4*  HCT 33.6* 33.0* 25.7* 24.5* 24.0*  MCV 84.6  --  85.9 84.8 84.5  PLT 216  --  213 187 181    CBG:  Recent Labs Lab 11/18/15 0647 11/18/15 1137 11/18/15 1617 11/18/15 2142 11/19/15 0636  GLUCAP 145* 190* 175* 168* 171*   SIGNED: Time coordinating discharge:  30 minutes  Faye Ramsay, MD  [facility] 11/19/2015, 9:51 AM Pager [phone]  If 7PM-7AM, please contact night-coverage [email or web address] Password  [email or web address]

## 2015-11-19 NOTE — Care Management (Signed)
Patient will go to [facility] for short term rehab. No case manager needs identified.

## 2015-11-19 NOTE — Care Management Important Message (Signed)
Important Message  Patient Details  Name: [name] MRN: [id number] Date of Birth: [birth year]   Medicare Important Message Given:  Yes    [name] P Osborn Pullin 11/19/2015, 10:56 AM

## 2015-11-19 NOTE — Progress Notes (Signed)
Patient will discharge to [facility] Anticipated discharge date:11/19/15 Family notified: Mingo Amber by Corey Harold- called at 12:55pm  [place] signing off.  Domenica Reamer, [place] Social Worker [phone]

## 2015-11-20 ENCOUNTER — Ambulatory Visit: Payer: Medicare Other | Admitting: Emergency Medicine

## 2015-11-20 ENCOUNTER — Encounter (HOSPITAL_COMMUNITY): Payer: Self-pay | Admitting: Orthopedic Surgery

## 2015-11-20 ENCOUNTER — Non-Acute Institutional Stay (SKILLED_NURSING_FACILITY): Payer: Medicare Other | Admitting: Adult Health

## 2015-11-20 DIAGNOSIS — D62 Acute posthemorrhagic anemia: Secondary | ICD-10-CM

## 2015-11-20 DIAGNOSIS — E119 Type 2 diabetes mellitus without complications: Secondary | ICD-10-CM | POA: Diagnosis not present

## 2015-11-20 DIAGNOSIS — N183 Chronic kidney disease, stage 3 (moderate): Secondary | ICD-10-CM | POA: Diagnosis not present

## 2015-11-20 DIAGNOSIS — E785 Hyperlipidemia, unspecified: Secondary | ICD-10-CM | POA: Diagnosis not present

## 2015-11-20 DIAGNOSIS — R569 Unspecified convulsions: Secondary | ICD-10-CM | POA: Diagnosis not present

## 2015-11-20 DIAGNOSIS — S72001S Fracture of unspecified part of neck of right femur, sequela: Secondary | ICD-10-CM

## 2015-11-20 DIAGNOSIS — I2510 Atherosclerotic heart disease of native coronary artery without angina pectoris: Secondary | ICD-10-CM | POA: Diagnosis not present

## 2015-11-20 DIAGNOSIS — I10 Essential (primary) hypertension: Secondary | ICD-10-CM | POA: Diagnosis not present

## 2015-11-20 DIAGNOSIS — F0390 Unspecified dementia without behavioral disturbance: Secondary | ICD-10-CM

## 2015-11-20 DIAGNOSIS — K5900 Constipation, unspecified: Secondary | ICD-10-CM | POA: Diagnosis not present

## 2015-11-20 DIAGNOSIS — N400 Enlarged prostate without lower urinary tract symptoms: Secondary | ICD-10-CM

## 2015-11-20 NOTE — Progress Notes (Signed)
Patient ID: [name], male   DOB: 03/06/1932, 81 y.o.   MRN: [id number]    DATE:  11/20/2015   MRN:  [id number]  BIRTHDAY: 1932/07/17  Facility:  Nursing Home Location:  [name]: 101-P  LEVEL OF CARE:  SNF (31)  Contact Information    Name Gove Daughter [phone]  [phone]   Kaare, Weideman   [phone]   Timthy, Nordmann   [phone]      Chief Complaint  Patient presents with  . Hospitalization Follow-up    Right hip fracure S/P intramedullary fixation , diabetes mellitus type II, hyperlipidemia, hypertension, dementia, chronic  kidney disease stage III, BPH , aemia, CAD ,constipation and seizure   HISTORY OF PRESENT ILLNESS:   This is an 81 year old male who has been admitted to [facility] on 11/19/15 from [facility]. He has PMH of CAD, diabetes mellitus type 2, hypertension and rectal cancer. He had a mechanical fall sustaining a right hip fracture for which he had intramedullary fixation on 11/16/15. He has been admitted for a short-term rehabilitation.  PAST MEDICAL HISTORY:  Past Medical History  Diagnosis Date  . Aortic root dilatation ([place])   . DM2 (diabetes mellitus, type 2) ([place])   . Stroke ([place])   . HLD (hyperlipidemia)   . HTN (hypertension)   . CAD (coronary artery disease)     Presumed from previously mildy abnormal myoview;  myoview 4/12: EF 73%, no ischemia  . Leukopenia   . Pneumothorax   . Rectal cancer ([place])   . Cardiomyopathy     a. echo 5/12: EF 45-50%, mild LVH, grade 2 diast dysfxn, RAE  . Colon cancer ([place])   . Chronic kidney disease     renal insuff, increased uric acid  . Seizures ([place])   . Diabetes mellitus   . BPH (benign prostatic hyperplasia)     increased PSA  . Anemia      CURRENT MEDICATIONS: Reviewed  Patient's Medications  New Prescriptions   No medications on file  Previous Medications   AMLODIPINE (NORVASC)  10 MG TABLET    Take 10 mg by mouth daily.   ASPIRIN EC 325 MG TABLET    Take 1 tablet (325 mg total) by mouth 2 (two) times daily after a meal.   B-D INS SYR MICROFINE .5CC/28G 28G X 1/2" 0.5 ML MISC    See admin instructions.   B-D INS SYR MICROFINE .5CC/28G 28G X 1/2" 0.5 ML MISC    USE AS DIRECTED   DOCUSATE SODIUM (COLACE) 100 MG CAPSULE    Take 1 capsule (100 mg total) by mouth 2 (two) times daily.   DONEPEZIL (ARICEPT) 10 MG TABLET    Take 10 mg by mouth daily.   DOXAZOSIN (CARDURA) 4 MG TABLET    TAKE 1 TABLET BY MOUTH AT BEDTIME.   ECHINACEA HERB PO    Take 1 tablet by mouth daily.   FINASTERIDE (PROSCAR) 5 MG TABLET    TAKE 1 TABLET BY MOUTH EVERY DAY   FUROSEMIDE (LASIX) 20 MG TABLET    TAKE 1 TABLET BY MOUTH EVERY DAY   GLUCOSE BLOOD (ONE TOUCH ULTRA TEST) TEST STRIP    USE TO CHECK BLOOD SUGAR 3 TIMES A DAY AS DIRECTED   HYDROCODONE-ACETAMINOPHEN (NORCO) 5-325 MG TABLET    Take 1-2 tablets by mouth every 6 (six) hours as needed for moderate pain.   INSULIN REGULAR (NOVOLIN  R,HUMULIN R) 100 UNITS/ML INJECTION    Inject 0-15 Units into the skin 3 (three) times daily before meals. < 70 hypoglycemic protocol; 71-120= )  ; 121-150= 2 units  ; 151-200 = 3 units;  201-250 = 5 units;  251-300 = 8 units;  301-350 = 11 units;  351- 400 = 15 units  ; >400 call MD/NP   LAMOTRIGINE (LAMICTAL) 100 MG TABLET    Take 100 mg by mouth 3 (three) times daily.   METOPROLOL SUCCINATE (TOPROL-XL) 25 MG 24 HR TABLET    TAKE 1 TABLET BY MOUTH EVERY DAY.   NITROGLYCERIN (NITROSTAT) 0.4 MG SL TABLET    Place 1 tablet (0.4 mg total) under the tongue every 5 (five) minutes as needed for chest pain.   NUTRITIONAL SUPPLEMENTS (JUICE PLUS FIBRE PO)    Take 2 tablets by mouth 2 (two) times daily.   PRAVASTATIN (PRAVACHOL) 20 MG TABLET    TAKE 1 TABLET BY MOUTH EVERY DAY   SENNA (SENOKOT) 8.6 MG TABS TABLET    Take 1 tablet (8.6 mg total) by mouth 2 (two) times daily.  Modified Medications   No medications on file    Discontinued Medications   AMLODIPINE (NORVASC) 5 MG TABLET    TAKE 1 TABLET BY MOUTH EVERY DAY   INSULIN REGULAR (HUMULIN R) 100 UNITS/ML INJECTION    Check 30-60 minutes prior to each meal. Give sliding-scale insulin as instructed     No Known Allergies   REVIEW OF SYSTEMS:  GENERAL: no change in appetite, no fatigue, no weight changes, no fever, chills or weakness EYES: Denies change in vision, dry eyes, eye pain, itching or discharge EARS: Denies change in hearing, ringing in ears, or earache NOSE: Denies nasal congestion or epistaxis MOUTH and THROAT: Denies oral discomfort, gingival pain or bleeding, pain from teeth or hoarseness   RESPIRATORY: no cough, SOB, DOE, wheezing, hemoptysis CARDIAC: no chest pain, edema or palpitations GI: no abdominal pain, diarrhea, constipation, heart burn, nausea or vomiting GU: Denies dysuria, frequency, hematuria, incontinence, or discharge PSYCHIATRIC: Denies feeling of depression or anxiety. No report of hallucinations, insomnia, paranoia, or agitation   PHYSICAL EXAMINATION  GENERAL APPEARANCE: Well nourished. In no acute distress. Normal body habitus SKIN:  Right femur surgical site is dry, no redness, covered with dry dressing HEAD: Normal in size and contour. No evidence of trauma EYES: Lids open and close normally. No blepharitis, entropion or ectropion. PERRL. Conjunctivae are clear and sclerae are white. Lenses are without opacity EARS: Pinnae are normal. Patient hears normal voice tunes of the examiner MOUTH and THROAT: Lips are without lesions. Oral mucosa is moist and without lesions. Tongue is normal in shape, size, and color and without lesions NECK: supple, trachea midline, no neck masses, no thyroid tenderness, no thyromegaly LYMPHATICS: no LAN in the neck, no supraclavicular LAN RESPIRATORY: breathing is even & unlabored, BS CTAB CARDIAC: RRR, no murmur,no extra heart sounds, no edema GI: abdomen soft, normal BS, no masses,  no tenderness, no hepatomegaly, no splenomegaly PSYCHIATRIC: Alert and oriented X 3. Affect and behavior are appropriate  LABS/RADIOLOGY: Labs reviewed: Basic Metabolic Panel:  Recent Labs  11/17/15 1042 11/18/15 0835 11/19/15 0505  NA 134* 133* 133*  K 5.5* 5.2* 5.1  CL 104 103 99*  CO2 24 26 28   GLUCOSE 170* 210* 181*  BUN 20 15 16   CREATININE 1.87* 1.60* 1.42*  CALCIUM 7.9* 7.9* 8.1*   Liver Function Tests:  Recent Labs  04/02/15 1122  AST  13  ALT 11  ALKPHOS 115  BILITOT 0.43  PROT 6.7  ALBUMIN 3.9   CBC:  Recent Labs  08/14/15 1425 10/13/15 1035 11/16/15 0008  11/17/15 0347 11/18/15 0749 11/19/15 0505  WBC 7.9 7.6 10.1  --  12.4* 10.1 6.7  NEUTROABS 5.2 4.5 8.2*  --   --   --   --   HGB 10.8* 11.4* 10.5*  < > 8.0* 7.5* 7.4*  HCT 34.3* 35.1* 33.6*  < > 25.7* 24.5* 24.0*  MCV 83.9 80.9 84.6  --  85.9 84.8 84.5  PLT 253 261 216  --  213 187 181  < > = values in this interval not displayed.  CBG:  Recent Labs  11/18/15 2142 11/19/15 0636 11/19/15 1126  GLUCAP 168* 171* 182*    Chest Portable 1 View  11/16/2015  CLINICAL DATA:  Pre-op for right hip surgery today. Right hip fracture. EXAM: PORTABLE CHEST 1 VIEW COMPARISON:  Radiographs 12/03/2011.  Chest CT 05/01/2011. FINDINGS: 0630 hours. There are lower lung volumes with mildly increased bibasilar atelectasis. There is chronic elevation of the left hemidiaphragm which appears unchanged. The heart size and mediastinal contours are stable. No edema, pneumothorax or significant pleural effusion. IMPRESSION: Mildly increased bibasilar atelectasis related to suboptimal inspiration. No other significant changes. Electronically Signed   By: Richardean Sale M.D.   On: 11/16/2015 08:55   Dg Hip Port Unilat With Pelvis 1v Right  11/16/2015  CLINICAL DATA:  Surgery follow-up. EXAM: DG HIP (WITH OR WITHOUT PELVIS) 1V PORT RIGHT COMPARISON:  Fluoroscopic intraoperative images dated 11/16/2015 and preoperative  plain film of the right hip dated 11/15/2015. FINDINGS: Rod and screw fixation hardware traversing the right femoral neck fracture site appears intact and stable in alignment compared to the intraoperative fluoroscopic images. No surgical complicating features seen. Osseous alignment is stable. Left hip arthroplasty hardware also appear stable in alignment. Soft tissues about the pelvis and right hip are unremarkable. IMPRESSION: Status post internal fixation of the intertrochanteric right femur fracture. Hardware appears intact and well aligned. No surgical complicating feature. Electronically Signed   By: Franki Cabot M.D.   On: 11/16/2015 13:59   Dg Hip Operative Unilat With Pelvis Right  11/16/2015  CLINICAL DATA:  Right hip nailing for intertrochanteric fracture. EXAM: OPERATIVE RIGHT HIP (WITH PELVIS IF PERFORMED) 3 VIEWS TECHNIQUE: Fluoroscopic spot image(s) were submitted for interpretation post-operatively. COMPARISON:  Radiographs 11/15/2015. FINDINGS: Three views demonstrate fixation of the intertrochanteric right femur fracture with a short segment intramedullary nail secured by a distal interlocking screw. The hardware appears well position. There is no dislocation or displacement of the fracture. IMPRESSION: No demonstrated complication following ORIF of intertrochanteric right femur fracture. Electronically Signed   By: Richardean Sale M.D.   On: 11/16/2015 11:30   Dg Hip Unilat With Pelvis 2-3 Views Right  11/15/2015  CLINICAL DATA:  Right lateral hip pain after fall. EXAM: DG HIP (WITH OR WITHOUT PELVIS) 2-3V RIGHT COMPARISON:  None. FINDINGS: There is an intertrochanteric right hip fracture. There is no dislocation. There is no bone lesion to suggest a pathologic basis for the fracture. IMPRESSION: Intertrochanteric right hip fracture. Electronically Signed   By: Andreas Newport M.D.   On: 11/15/2015 23:17    ASSESSMENT/PLAN:  Right hip fracture S/P intramedullary  fixation -  For  rehabilitation; tinea aspirin 325 mg EC 1 tab by mouth twice a day for DVT prophylaxis; Norco 5/325 mg 1-2 tabs by mouth every 6 hours when necessary for pain;  follow-up with Dr. Lyla Glassing, orthopedic surgeon, in 2 weeks  Diabetes mellitus, type II - hemoglobin A1c 7.4; continue Humulin R sliding scale SQ 3 times a day before meals  Hyperlipidemia - continue pravastatin 20 mg 1 tab by mouth daily  Hypertension - BP 147/70, increase Norvasc to 10 mg 1 tab by mouth daily, continue Toprol-XL 25 mg 1 tab by mouth daily and furosemide 20 mg 1 tab by mouth daily  Dementia - continue Aricept 10 mg 1 tab by mouth daily  Chronic kidney disease stage III - creatinine 1.42; check BMP  BPH - continue doxazosin mesylate 4 mg 1 tab by mouth daily at bedtime and finasteride 5 mg 1 tab by mouth daily  Anemia, acute blood loss - hemoglobin 7.4; check CBC; transfuse if hemoglobin < 7  CAD - stable; continue aspirin, Toprol-XL and NTG when necessary  Constipation - continue Colace 100 mg 1 capsule by mouth twice a day and senna 1 tab by mouth twice a day  Seizures - continue lamotrigine 100 mg 1 tab by mouth 3 times a day     Goals of care:  Short-term rehabilitation     [facility] NP [facility] [phone]

## 2015-11-22 ENCOUNTER — Other Ambulatory Visit: Payer: Self-pay | Admitting: Emergency Medicine

## 2015-11-24 ENCOUNTER — Ambulatory Visit: Payer: Medicare Other | Admitting: Cardiology

## 2015-11-24 ENCOUNTER — Non-Acute Institutional Stay (SKILLED_NURSING_FACILITY): Payer: Medicare Other | Admitting: Internal Medicine

## 2015-11-24 DIAGNOSIS — E119 Type 2 diabetes mellitus without complications: Secondary | ICD-10-CM

## 2015-11-24 DIAGNOSIS — D62 Acute posthemorrhagic anemia: Secondary | ICD-10-CM | POA: Diagnosis not present

## 2015-11-24 DIAGNOSIS — N183 Chronic kidney disease, stage 3 unspecified: Secondary | ICD-10-CM

## 2015-11-24 DIAGNOSIS — R2681 Unsteadiness on feet: Secondary | ICD-10-CM | POA: Diagnosis not present

## 2015-11-24 DIAGNOSIS — E785 Hyperlipidemia, unspecified: Secondary | ICD-10-CM

## 2015-11-24 DIAGNOSIS — F0390 Unspecified dementia without behavioral disturbance: Secondary | ICD-10-CM | POA: Diagnosis not present

## 2015-11-24 DIAGNOSIS — N400 Enlarged prostate without lower urinary tract symptoms: Secondary | ICD-10-CM | POA: Diagnosis not present

## 2015-11-24 DIAGNOSIS — R569 Unspecified convulsions: Secondary | ICD-10-CM | POA: Diagnosis not present

## 2015-11-24 DIAGNOSIS — K5901 Slow transit constipation: Secondary | ICD-10-CM

## 2015-11-24 DIAGNOSIS — I10 Essential (primary) hypertension: Secondary | ICD-10-CM | POA: Diagnosis not present

## 2015-11-24 DIAGNOSIS — S72141S Displaced intertrochanteric fracture of right femur, sequela: Secondary | ICD-10-CM

## 2015-11-24 NOTE — Progress Notes (Signed)
Patient ID: [name], male   DOB: [birth year], 81 y.o.   MRN: [id number]     [facility]   PCP: DAUB, Lina Sayre, MD  Code Status: full code  No Known Allergies  Chief Complaint  Patient presents with  . New Admit To SNF     HPI:  81 y.o. patient is here for short term rehabilitation post hospital admission from 11/15/15-11/19/15 post fall with right hip fracture. He underwent surgical repair. He has PMH of DM, HTN, HLD, CKD stage 3, CAD among others. He has been constipated. He denies any other concerns. Pain is under control with current pain regimen.   Review of Systems:  Constitutional: Negative for fever, chills, diaphoresis.  HENT: Negative for headache, congestion, nasal discharge, difficulty swallowing.   Eyes: Negative for eye pain, blurred vision, double vision and discharge.  Respiratory: Negative for cough, shortness of breath and wheezing.   Cardiovascular: Negative for chest pain, palpitations, leg swelling.  Gastrointestinal: Negative for heartburn, nausea, vomiting, abdominal pain. Last bowel movement 4 days back  Genitourinary: Negative for dysuria, flank pain.  Musculoskeletal: Negative for back pain, falls. Skin: Negative for itching, rash.  Neurological: Negative for dizziness, tingling, focal weakness Psychiatric/Behavioral: Negative for depression   Past Medical History  Diagnosis Date  . Aortic root dilatation ([place])   . DM2 (diabetes mellitus, type 2) ([place])   . Stroke ([place])   . HLD (hyperlipidemia)   . HTN (hypertension)   . CAD (coronary artery disease)     Presumed from previously mildy abnormal myoview;  myoview 4/12: EF 73%, no ischemia  . Leukopenia   . Pneumothorax   . Rectal cancer ([place])   . Cardiomyopathy     a. echo 5/12: EF 45-50%, mild LVH, grade 2 diast dysfxn, RAE  . Colon cancer ([place])   . Chronic kidney disease     renal insuff, increased uric acid  . Seizures ([place])   . Diabetes mellitus   .  BPH (benign prostatic hyperplasia)     increased PSA  . Anemia    Past Surgical History  Procedure Laterality Date  . Hip arthroplasty    . Tonsillectomy    . S/p resection of rectal cancer    . Vasectomy    . Ileostomy  12/2010  . Reversal ileostomy  03/2011  . Colon surgery    . Joint replacement    . Femur im nail Right 11/16/2015    Procedure: INTRAMEDULLARY TROCHANTERIC HIP NAILING;  Surgeon: Rod Can, MD;  Location: [facility];  Service: Orthopedics;  Laterality: Right;   Social History:   reports that he has never smoked. He has never used smokeless tobacco. He reports that he drinks about 2.4 oz of alcohol per week. He reports that he does not use illicit drugs.  Family History  Problem Relation Age of Onset  . Alzheimer's disease    . Parkinsonism Other   . Parkinsonism Father   . Stomach cancer Neg Hx   . Colon cancer Neg Hx     Medications:   Medication List       This list is accurate as of: 11/24/15  3:55 PM.  Always use your most recent med list.               amLODipine 10 MG tablet  Commonly known as:  NORVASC  Take 10 mg by mouth daily.     aspirin EC 325 MG tablet  Take 1 tablet (325  mg total) by mouth 2 (two) times daily after a meal.     B-D INS SYR MICROFINE .5CC/28G 28G X 1/2" 0.5 ML Misc  Generic drug:  INSULIN SYRINGE .5CC/28G  See admin instructions.     B-D INS SYR MICROFINE .5CC/28G 28G X 1/2" 0.5 ML Misc  Generic drug:  INSULIN SYRINGE .5CC/28G  USE AS DIRECTED     docusate sodium 100 MG capsule  Commonly known as:  COLACE  Take 1 capsule (100 mg total) by mouth 2 (two) times daily.     donepezil 10 MG tablet  Commonly known as:  ARICEPT  Take 10 mg by mouth daily.     doxazosin 4 MG tablet  Commonly known as:  CARDURA  TAKE 1 TABLET BY MOUTH AT BEDTIME.     ECHINACEA HERB PO  Take 1 tablet by mouth daily.     finasteride 5 MG tablet  Commonly known as:  PROSCAR  TAKE 1 TABLET BY MOUTH EVERY DAY     furosemide 20 MG  tablet  Commonly known as:  LASIX  TAKE 1 TABLET BY MOUTH EVERY DAY     glucose blood test strip  Commonly known as:  ONE TOUCH ULTRA TEST  Dx: E11.22     HYDROcodone-acetaminophen 5-325 MG tablet  Commonly known as:  NORCO  Take 1-2 tablets by mouth every 6 (six) hours as needed for moderate pain.     insulin regular 100 units/mL injection  Commonly known as:  NOVOLIN R,HUMULIN R  Inject 0-15 Units into the skin 3 (three) times daily before meals. < 70 hypoglycemic protocol; 71-120= )  ; 121-150= 2 units  ; 151-200 = 3 units;  201-250 = 5 units;  251-300 = 8 units;  301-350 = 11 units;  351- 400 = 15 units  ; >400 call MD/NP     JUICE PLUS FIBRE PO  Take 2 tablets by mouth 2 (two) times daily.     lamoTRIgine 100 MG tablet  Commonly known as:  LAMICTAL  Take 100 mg by mouth 3 (three) times daily.     metoprolol succinate 25 MG 24 hr tablet  Commonly known as:  TOPROL-XL  TAKE 1 TABLET BY MOUTH EVERY DAY.     nitroGLYCERIN 0.4 MG SL tablet  Commonly known as:  NITROSTAT  Place 1 tablet (0.4 mg total) under the tongue every 5 (five) minutes as needed for chest pain.     pravastatin 20 MG tablet  Commonly known as:  PRAVACHOL  TAKE 1 TABLET BY MOUTH EVERY DAY     senna 8.6 MG Tabs tablet  Commonly known as:  SENOKOT  Take 1 tablet (8.6 mg total) by mouth 2 (two) times daily.         Physical Exam Filed Vitals:   11/24/15 1554  BP: 162/83  Pulse: 84  Temp: 97 F (36.1 C)  Resp: 18  SpO2: 90%    General- elderly male, well built, in no acute distress Head- normocephalic, atraumatic Nose- normal nasal mucosa Throat- moist mucus membrane Eyes- PERRLA, EOMI, no pallor, no icterus, no discharge, normal conjunctiva, normal sclera Neck- no cervical lymphadenopathy Cardiovascular- normal s1,s2, palpable dorsalis pedis and radial pulses, trace leg edema Respiratory- bilateral clear to auscultation, no wheeze, no rhonchi, no crackles, no use of accessory  muscles Abdomen- bowel sounds present, soft, non tender Musculoskeletal- able to move all 4 extremities, limited right leg range of motion  Neurological- no focal deficit, alert and oriented to person, place  and time Skin- warm and dry, right hip surgical incision with glue, healing well Psychiatry- normal mood and affect    Labs reviewed: Basic Metabolic Panel:  Recent Labs  11/17/15 1042 11/18/15 0835 11/19/15 0505  NA 134* 133* 133*  K 5.5* 5.2* 5.1  CL 104 103 99*  CO2 24 26 28   GLUCOSE 170* 210* 181*  BUN 20 15 16   CREATININE 1.87* 1.60* 1.42*  CALCIUM 7.9* 7.9* 8.1*   Liver Function Tests:  Recent Labs  04/02/15 1122  AST 13  ALT 11  ALKPHOS 115  BILITOT 0.43  PROT 6.7  ALBUMIN 3.9   No results for input(s): LIPASE, AMYLASE in the last 8760 hours. No results for input(s): AMMONIA in the last 8760 hours. CBC:  Recent Labs  08/14/15 1425 10/13/15 1035 11/16/15 0008  11/17/15 0347 11/18/15 0749 11/19/15 0505  WBC 7.9 7.6 10.1  --  12.4* 10.1 6.7  NEUTROABS 5.2 4.5 8.2*  --   --   --   --   HGB 10.8* 11.4* 10.5*  < > 8.0* 7.5* 7.4*  HCT 34.3* 35.1* 33.6*  < > 25.7* 24.5* 24.0*  MCV 83.9 80.9 84.6  --  85.9 84.8 84.5  PLT 253 261 216  --  213 187 181  < > = values in this interval not displayed. Cardiac Enzymes: No results for input(s): CKTOTAL, CKMB, CKMBINDEX, TROPONINI in the last 8760 hours. BNP: Invalid input(s): POCBNP CBG:  Recent Labs  11/18/15 2142 11/19/15 0636 11/19/15 1126  GLUCAP 168* 171* 182*   11/21/15 na 135, k 5.3, glu 198, bun 16, cr 1.15, wbc 6, hb 7.7, hct 24.8, mcv 82.7, plt 242  Radiological Exams: Chest Portable 1 View  11/16/2015  CLINICAL DATA:  Pre-op for right hip surgery today. Right hip fracture. EXAM: PORTABLE CHEST 1 VIEW COMPARISON:  Radiographs 12/03/2011.  Chest CT 05/01/2011. FINDINGS: 0630 hours. There are lower lung volumes with mildly increased bibasilar atelectasis. There is chronic elevation of the left  hemidiaphragm which appears unchanged. The heart size and mediastinal contours are stable. No edema, pneumothorax or significant pleural effusion. IMPRESSION: Mildly increased bibasilar atelectasis related to suboptimal inspiration. No other significant changes. Electronically Signed   By: Richardean Sale M.D.   On: 11/16/2015 08:55   Dg Hip Port Unilat With Pelvis 1v Right  11/16/2015  CLINICAL DATA:  Surgery follow-up. EXAM: DG HIP (WITH OR WITHOUT PELVIS) 1V PORT RIGHT COMPARISON:  Fluoroscopic intraoperative images dated 11/16/2015 and preoperative plain film of the right hip dated 11/15/2015. FINDINGS: Rod and screw fixation hardware traversing the right femoral neck fracture site appears intact and stable in alignment compared to the intraoperative fluoroscopic images. No surgical complicating features seen. Osseous alignment is stable. Left hip arthroplasty hardware also appear stable in alignment. Soft tissues about the pelvis and right hip are unremarkable. IMPRESSION: Status post internal fixation of the intertrochanteric right femur fracture. Hardware appears intact and well aligned. No surgical complicating feature. Electronically Signed   By: Franki Cabot M.D.   On: 11/16/2015 13:59   Dg Hip Operative Unilat With Pelvis Right  11/16/2015  CLINICAL DATA:  Right hip nailing for intertrochanteric fracture. EXAM: OPERATIVE RIGHT HIP (WITH PELVIS IF PERFORMED) 3 VIEWS TECHNIQUE: Fluoroscopic spot image(s) were submitted for interpretation post-operatively. COMPARISON:  Radiographs 11/15/2015. FINDINGS: Three views demonstrate fixation of the intertrochanteric right femur fracture with a short segment intramedullary nail secured by a distal interlocking screw. The hardware appears well position. There is no dislocation or displacement  of the fracture. IMPRESSION: No demonstrated complication following ORIF of intertrochanteric right femur fracture. Electronically Signed   By: Richardean Sale M.D.   On:  11/16/2015 11:30   Dg Hip Unilat With Pelvis 2-3 Views Right  11/15/2015  CLINICAL DATA:  Right lateral hip pain after fall. EXAM: DG HIP (WITH OR WITHOUT PELVIS) 2-3V RIGHT COMPARISON:  None. FINDINGS: There is an intertrochanteric right hip fracture. There is no dislocation. There is no bone lesion to suggest a pathologic basis for the fracture. IMPRESSION: Intertrochanteric right hip fracture. Electronically Signed   By: Andreas Newport M.D.   On: 11/15/2015 23:17     Assessment/Plan  Unsteady gait Will have him work with physical therapy and occupational therapy team to help with gait training and muscle strengthening exercises.fall precautions. Skin care. Encourage to be out of bed.   Right hip fracture  S/P intramedullary  fixation WBAT. Has follow up with orthopedics. Continue norco 5-325 mg 1-2 tab q6h prn pain and EC ASA 325 mg bid for dvt prophylaxis. Will have patient work with PT/OT as tolerated to regain strength and restore function.  Fall precautions are in place.  Blood loss anemia Monitor cbc, Hb 7.7. Start feso4 325 mg bid for now, check cbc in 1 week  Constipation Discontinue colace and senna. Start senna s 2 tab qhs with miralax 17 g daily for now and reassess.   HTN Elevated SBP. Continue norvasc 10 mg daily (increased from 5 mg daily recently), toprol xl 25 mg daily and lasix 20 mg daily, monitor BMP and BP  Diabetes mellitus, type II  A1c 7.4. Continue humulin SSI with meals and monitor cbg  ckd stage 3 Monitor BMP  Hyperlipidemia continue pravastatin 20 mg daily  Dementia continue Aricept 10 mg daily  Seizure disorder Remains seizure free. Continue lamotrigine 100 mg tid  BPH continue doxazosin 4 mg daily and finasteride 5 mg daily   Goals of care: short term rehabilitation   Labs/tests ordered: cbc, cmp 1 week  Family/ staff Communication: reviewed care plan with patient and nursing supervisor    Blanchie Serve, MD  [facility] [phone] (Monday-Friday 8 am - 5 pm) [phone] (afterhours)

## 2015-11-25 ENCOUNTER — Telehealth: Payer: Self-pay

## 2015-11-25 NOTE — Telephone Encounter (Signed)
I called and spoke with his daughter. I advised that she speak with the nurse about having the nurse practitioner or PA assigned to that nursing facility to examine his right inguinal hernia and be sure it is reducible.

## 2015-11-25 NOTE — Telephone Encounter (Signed)
[name] states she would like to speak with Dr Everlene Farrier regarding her father, thinks the infection have gone into his scrotum, would like a call back at [phone]

## 2015-11-28 ENCOUNTER — Telehealth: Payer: Self-pay

## 2015-11-28 NOTE — Telephone Encounter (Signed)
[name] stopped by to request a script for a "stair lift" from Dr Everlene Farrier. The company that installed it will tax off the taxes if he would write a script for it. The script must say stair lift. He left the bill for it so Dr Everlene Farrier could see it. I placed the bill in Dr Perfecto Kingdom box. [name] or [name] may be reached @ [phone] or [phone]. Thank you

## 2015-11-29 ENCOUNTER — Other Ambulatory Visit: Payer: Self-pay | Admitting: *Deleted

## 2015-11-29 ENCOUNTER — Encounter: Payer: Self-pay | Admitting: *Deleted

## 2015-11-30 ENCOUNTER — Telehealth: Payer: Self-pay | Admitting: *Deleted

## 2015-11-30 MED ORDER — MISC. DEVICES MISC
Status: DC
Start: 2015-11-30 — End: 2017-10-14

## 2015-11-30 NOTE — Telephone Encounter (Signed)
Spoke with wife and they need an actual rx for a stair lift.  rx printed and placed in your box.  After signing please call pt wife and let them know this is ready for pickup.

## 2015-11-30 NOTE — Telephone Encounter (Signed)
Please print this out and put it in my box and I will sign it.

## 2015-12-01 DIAGNOSIS — Z4789 Encounter for other orthopedic aftercare: Secondary | ICD-10-CM | POA: Diagnosis not present

## 2015-12-01 DIAGNOSIS — S72144D Nondisplaced intertrochanteric fracture of right femur, subsequent encounter for closed fracture with routine healing: Secondary | ICD-10-CM | POA: Diagnosis not present

## 2015-12-01 NOTE — Telephone Encounter (Signed)
Left message advised Rx ready to pick up.

## 2015-12-12 ENCOUNTER — Other Ambulatory Visit: Payer: Medicare Other

## 2015-12-17 ENCOUNTER — Encounter: Payer: Self-pay | Admitting: Podiatry

## 2015-12-17 ENCOUNTER — Ambulatory Visit (INDEPENDENT_AMBULATORY_CARE_PROVIDER_SITE_OTHER): Payer: Medicare Other | Admitting: Podiatry

## 2015-12-17 DIAGNOSIS — E1142 Type 2 diabetes mellitus with diabetic polyneuropathy: Secondary | ICD-10-CM | POA: Diagnosis not present

## 2015-12-17 DIAGNOSIS — B351 Tinea unguium: Secondary | ICD-10-CM

## 2015-12-17 DIAGNOSIS — M79673 Pain in unspecified foot: Secondary | ICD-10-CM | POA: Diagnosis not present

## 2015-12-17 NOTE — Patient Instructions (Signed)
Diabetes and Foot Care Diabetes may cause you to have problems because of poor blood supply (circulation) to your feet and legs. This may cause the skin on your feet to become thinner, break easier, and heal more slowly. Your skin may become dry, and the skin may peel and crack. You may also have nerve damage in your legs and feet causing decreased feeling in them. You may not notice minor injuries to your feet that could lead to infections or more serious problems. Taking care of your feet is one of the most important things you can do for yourself.  HOME CARE INSTRUCTIONS  Wear shoes at all times, even in the house. Do not go barefoot. Bare feet are easily injured.  Check your feet daily for blisters, cuts, and redness. If you cannot see the bottom of your feet, use a mirror or ask someone for help.  Wash your feet with warm water (do not use hot water) and mild soap. Then pat your feet and the areas between your toes until they are completely dry. Do not soak your feet as this can dry your skin.  Apply a moisturizing lotion or petroleum jelly (that does not contain alcohol and is unscented) to the skin on your feet and to dry, brittle toenails. Do not apply lotion between your toes.  Trim your toenails straight across. Do not dig under them or around the cuticle. File the edges of your nails with an emery board or nail file.  Do not cut corns or calluses or try to remove them with medicine.  Wear clean socks or stockings every day. Make sure they are not too tight. Do not wear knee-high stockings since they may decrease blood flow to your legs.  Wear shoes that fit properly and have enough cushioning. To break in new shoes, wear them for just a few hours a day. This prevents you from injuring your feet. Always look in your shoes before you put them on to be sure there are no objects inside.  Do not cross your legs. This may decrease the blood flow to your feet.  If you find a minor scrape,  cut, or break in the skin on your feet, keep it and the skin around it clean and dry. These areas may be cleansed with mild soap and water. Do not cleanse the area with peroxide, alcohol, or iodine.  When you remove an adhesive bandage, be sure not to damage the skin around it.  If you have a wound, look at it several times a day to make sure it is healing.  Do not use heating pads or hot water bottles. They may burn your skin. If you have lost feeling in your feet or legs, you may not know it is happening until it is too late.  Make sure your health care provider performs a complete foot exam at least annually or more often if you have foot problems. Report any cuts, sores, or bruises to your health care provider immediately. SEEK MEDICAL CARE IF:   You have an injury that is not healing.  You have cuts or breaks in the skin.  You have an ingrown nail.  You notice redness on your legs or feet.  You feel burning or tingling in your legs or feet.  You have pain or cramps in your legs and feet.  Your legs or feet are numb.  Your feet always feel cold. SEEK IMMEDIATE MEDICAL CARE IF:   There is increasing redness,   swelling, or pain in or around a wound.  There is a red line that goes up your leg.  Pus is coming from a wound.  You develop a fever or as directed by your health care provider.  You notice a bad smell coming from an ulcer or wound.   This information is not intended to replace advice given to you by your health care provider. Make sure you discuss any questions you have with your health care provider.   Document Released: 11/26/2000 Document Revised: 08/01/2013 Document Reviewed: 05/08/2013 Elsevier Interactive Patient Education 2016 [employer]  

## 2015-12-18 NOTE — Progress Notes (Signed)
Patient ID: [name], male   DOB: 29-Jul-1932, 82 y.o.   MRN: [id number]  Subjective: This patient presents for scheduled visit complaining of thickened and elongated toenails 6-10 and requesting nail debridement  Objective: No open skin lesions bilaterally The toenails are hypertrophic, elongated, deformed, discolored 6-10  Assessment: Mycotic toenails 6-10 Diabetic with peripheral neuropathy  Plan: Debridement toenails 6-10 mechanically and electrically without any bleeding  Reappoint 3 months

## 2015-12-25 ENCOUNTER — Encounter: Payer: Self-pay | Admitting: Emergency Medicine

## 2015-12-25 ENCOUNTER — Ambulatory Visit (INDEPENDENT_AMBULATORY_CARE_PROVIDER_SITE_OTHER): Payer: Medicare Other | Admitting: Emergency Medicine

## 2015-12-25 VITALS — BP 130/62 | HR 80 | Temp 98.3°F | Resp 16 | Ht 72.0 in | Wt 206.0 lb

## 2015-12-25 DIAGNOSIS — K4090 Unilateral inguinal hernia, without obstruction or gangrene, not specified as recurrent: Secondary | ICD-10-CM | POA: Diagnosis not present

## 2015-12-25 DIAGNOSIS — E1122 Type 2 diabetes mellitus with diabetic chronic kidney disease: Secondary | ICD-10-CM | POA: Diagnosis not present

## 2015-12-25 DIAGNOSIS — D61818 Other pancytopenia: Secondary | ICD-10-CM | POA: Diagnosis not present

## 2015-12-25 DIAGNOSIS — N183 Chronic kidney disease, stage 3 (moderate): Secondary | ICD-10-CM

## 2015-12-25 LAB — CBC WITH DIFFERENTIAL/PLATELET
BASOS PCT: 0 % (ref 0–1)
Basophils Absolute: 0 10*3/uL (ref 0.0–0.1)
Eosinophils Absolute: 0.1 10*3/uL (ref 0.0–0.7)
Eosinophils Relative: 1 % (ref 0–5)
HEMATOCRIT: 35.3 % — AB (ref 39.0–52.0)
HEMOGLOBIN: 11.4 g/dL — AB (ref 13.0–17.0)
LYMPHS ABS: 2.1 10*3/uL (ref 0.7–4.0)
Lymphocytes Relative: 29 % (ref 12–46)
MCH: 27 pg (ref 26.0–34.0)
MCHC: 32.3 g/dL (ref 30.0–36.0)
MCV: 83.6 fL (ref 78.0–100.0)
MONO ABS: 0.4 10*3/uL (ref 0.1–1.0)
MONOS PCT: 5 % (ref 3–12)
MPV: 9.3 fL (ref 8.6–12.4)
NEUTROS ABS: 4.7 10*3/uL (ref 1.7–7.7)
Neutrophils Relative %: 65 % (ref 43–77)
Platelets: 290 10*3/uL (ref 150–400)
RBC: 4.22 MIL/uL (ref 4.22–5.81)
RDW: 15.3 % (ref 11.5–15.5)
WBC: 7.3 10*3/uL (ref 4.0–10.5)

## 2015-12-25 LAB — GLUCOSE, POCT (MANUAL RESULT ENTRY): POC Glucose: 199 mg/dl — AB (ref 70–99)

## 2015-12-25 LAB — POCT GLYCOSYLATED HEMOGLOBIN (HGB A1C): HEMOGLOBIN A1C: 6.5

## 2015-12-25 LAB — MICROALBUMIN, URINE: Microalb, Ur: 3.9 mg/dL

## 2015-12-25 NOTE — Progress Notes (Addendum)
Patient ID: [name], male   DOB: 05/07/1932, 82 y.o.   MRN: [id number]     By signing my name below, I, Zola Button, attest that this documentation has been prepared under the direction and in the presence of [name], MD.  Electronically Signed: Zola Button, Medical Scribe. 12/25/2015. 10:55 AM.   Chief Complaint:  Chief Complaint  Patient presents with  . Follow-up  . Diabetes    HPI: [name] is a 82 y.o. male with a history of DM who reports to [facility] today for a follow-up. He is 5 weeks out from a right hip fracture. Patient is currently staying at [facility] for rehab.  Patient states he has not had any trouble with his right inguinal hernia.   Past Medical History  Diagnosis Date  . Aortic root dilatation ([place])   . DM2 (diabetes mellitus, type 2) ([place])   . Stroke ([place])   . HLD (hyperlipidemia)   . HTN (hypertension)   . CAD (coronary artery disease)     Presumed from previously mildy abnormal myoview;  myoview 4/12: EF 73%, no ischemia  . Leukopenia   . Pneumothorax   . Rectal cancer ([place])   . Cardiomyopathy     a. echo 5/12: EF 45-50%, mild LVH, grade 2 diast dysfxn, RAE  . Colon cancer ([place])   . Chronic kidney disease     renal insuff, increased uric acid  . Seizures ([place])   . Diabetes mellitus   . BPH (benign prostatic hyperplasia)     increased PSA  . Anemia    Past Surgical History  Procedure Laterality Date  . Hip arthroplasty    . Tonsillectomy    . S/p resection of rectal cancer    . Vasectomy    . Ileostomy  12/2010  . Reversal ileostomy  03/2011  . Colon surgery    . Joint replacement    . Femur im nail Right 11/16/2015    Procedure: INTRAMEDULLARY TROCHANTERIC HIP NAILING;  Surgeon: Rod Can, MD;  Location: [place];  Service: Orthopedics;  Laterality: Right;   Social History   Social History  . Marital Status: Widowed    Spouse Name: N/A  . Number of Children: N/A  . Years of Education: N/A   Occupational History  .  retired    Social History Main Topics  . Smoking status: Never Smoker   . Smokeless tobacco: Never Used  . Alcohol Use: Yes  . Drug Use: No  . Sexual Activity: Not Asked   Other Topics Concern  . None   Social History Narrative   Family History  Problem Relation Age of Onset  . Alzheimer's disease    . Parkinsonism Other   . Parkinsonism Father   . Stomach cancer Neg Hx   . Colon cancer Neg Hx    No Known Allergies Prior to Admission medications   Medication Sig Start Date End Date Taking? Authorizing Provider  amLODipine (NORVASC) 10 MG tablet Take 10 mg by mouth daily.   Yes Historical Provider, MD  aspirin EC 325 MG tablet Take 1 tablet (325 mg total) by mouth 2 (two) times daily after a meal. 11/18/15  Yes Rod Can, MD  B-D INS SYR MICROFINE .5CC/28G 28G X 1/2" 0.5 ML MISC See admin instructions. 04/09/15  Yes Historical Provider, MD  B-D INS SYR MICROFINE .5CC/28G 28G X 1/2" 0.5 ML MISC USE AS DIRECTED 08/07/15  Yes Robyn Haber, MD  donepezil (ARICEPT) 10 MG tablet  Take 10 mg by mouth daily. 09/18/15  Yes Historical Provider, MD  doxazosin (CARDURA) 4 MG tablet TAKE 1 TABLET BY MOUTH AT BEDTIME. 06/20/15  Yes Chelle Jeffery, PA-C  ECHINACEA HERB PO Take 1 tablet by mouth daily.   Yes Historical Provider, MD  finasteride (PROSCAR) 5 MG tablet TAKE 1 TABLET BY MOUTH EVERY DAY 11/24/15  Yes [name], MD  furosemide (LASIX) 20 MG tablet TAKE 1 TABLET BY MOUTH EVERY DAY 01/29/15  Yes [name], MD  glucose blood (ONE TOUCH ULTRA TEST) test strip Dx: E11.22 11/21/15  Yes [name], MD  HYDROcodone-acetaminophen (NORCO) 5-325 MG tablet Take 1-2 tablets by mouth every 6 (six) hours as needed for moderate pain. 11/18/15  Yes Rod Can, MD  insulin regular (NOVOLIN R,HUMULIN R) 100 units/mL injection Inject 0-15 Units into the skin 3 (three) times daily before meals. < 70 hypoglycemic protocol; 71-120= )  ; 121-150= 2 units  ; 151-200 = 3 units;  201-250 = 5 units;   251-300 = 8 units;  301-350 = 11 units;  351- 400 = 15 units  ; >400 call MD/NP   Yes Historical Provider, MD  lamoTRIgine (LAMICTAL) 100 MG tablet Take 100 mg by mouth 3 (three) times daily. 04/21/15  Yes Historical Provider, MD  metoprolol succinate (TOPROL-XL) 25 MG 24 hr tablet TAKE 1 TABLET BY MOUTH EVERY DAY. 08/19/15  Yes Chelle Jeffery, PA-C  Misc. Devices MISC Stair Lift.  Use as directed.  DX Code: [id number] 11/30/15  Yes [name], MD  nitroGLYCERIN (NITROSTAT) 0.4 MG SL tablet Place 1 tablet (0.4 mg total) under the tongue every 5 (five) minutes as needed for chest pain. 09/17/13  Yes Lelon Perla, MD  Nutritional Supplements (JUICE PLUS FIBRE PO) Take 2 tablets by mouth 2 (two) times daily.   Yes Historical Provider, MD  pravastatin (PRAVACHOL) 20 MG tablet TAKE 1 TABLET BY MOUTH EVERY DAY 09/02/15  Yes Chelle Jeffery, PA-C  senna (SENOKOT) 8.6 MG TABS tablet Take 1 tablet (8.6 mg total) by mouth 2 (two) times daily. 11/19/15  Yes Theodis Blaze, MD  docusate sodium (COLACE) 100 MG capsule Take 1 capsule (100 mg total) by mouth 2 (two) times daily. Patient not taking: Reported on 12/25/2015 11/19/15   Theodis Blaze, MD     ROS: The patient denies fevers, chills, night sweats, unintentional weight loss, chest pain, palpitations, wheezing, dyspnea on exertion, nausea, vomiting, abdominal pain, dysuria, hematuria, melena, numbness, or tingling.   All other systems have been reviewed and were otherwise negative with the exception of those mentioned in the HPI and as above.    PHYSICAL EXAM: Filed Vitals:   12/25/15 1050  BP: 130/62  Pulse: 80  Temp: 98.3 F (36.8 C)  Resp: 16   Body mass index is 27.93 kg/(m^2).   General: Alert, no acute distress HEENT:  Normocephalic, atraumatic, oropharynx patent. Eye: [facility] Cardiovascular:  Regular rate and rhythm, no rubs murmurs or gallops.  No Carotid bruits, radial pulse intact. No pedal edema.  Respiratory: Clear to  auscultation bilaterally.  No wheezes, rales, or rhonchi.  No cyanosis, no use of accessory musculature Abdominal: No organomegaly, abdomen is soft and non-tender, positive bowel sounds.  No masses. Musculoskeletal: Healing scar over the right lateral hip.  Skin: No rashes. Neurologic: Facial musculature symmetric. Psychiatric: Patient acts appropriately throughout our interaction. Lymphatic: No cervical or submandibular lymphadenopathy Genitourinary: Grapefruit-sized right inguinal hernia.   LABS: Results for orders placed  or performed in visit on 12/25/15  POCT glucose (manual entry)  Result Value Ref Range   POC Glucose 199 (A) 70 - 99 mg/dl  POCT glycosylated hemoglobin (Hb A1C)  Result Value Ref Range   Hemoglobin A1C 6.5      EKG/XRAY:   Primary read interpreted by Dr. Everlene Farrier at [facility]   ASSESSMENT/PLAN: Hemoglobin A1c is excellent. Continue to care facility doing OT PT. His A1c was 6.5 and I put a note to not give insulin unless sugar is above 140.I personally performed the services described in this documentation, which was scribed in my presence. The recorded information has been reviewed and is accurate.    Gross sideeffects, risk and benefits, and alternatives of medications d/w patient. Patient is aware that all medications have potential sideeffects and we are unable to predict every sideeffect or drug-drug interaction that may occur.  [name] MD 12/25/2015 10:55 AM

## 2015-12-26 ENCOUNTER — Encounter: Payer: Self-pay | Admitting: Adult Health

## 2015-12-26 ENCOUNTER — Non-Acute Institutional Stay (SKILLED_NURSING_FACILITY): Payer: Medicare Other | Admitting: Adult Health

## 2015-12-26 DIAGNOSIS — E119 Type 2 diabetes mellitus without complications: Secondary | ICD-10-CM

## 2015-12-26 DIAGNOSIS — E785 Hyperlipidemia, unspecified: Secondary | ICD-10-CM | POA: Diagnosis not present

## 2015-12-26 DIAGNOSIS — N400 Enlarged prostate without lower urinary tract symptoms: Secondary | ICD-10-CM

## 2015-12-26 DIAGNOSIS — N183 Chronic kidney disease, stage 3 unspecified: Secondary | ICD-10-CM

## 2015-12-26 DIAGNOSIS — F0390 Unspecified dementia without behavioral disturbance: Secondary | ICD-10-CM | POA: Diagnosis not present

## 2015-12-26 DIAGNOSIS — R569 Unspecified convulsions: Secondary | ICD-10-CM

## 2015-12-26 DIAGNOSIS — I10 Essential (primary) hypertension: Secondary | ICD-10-CM | POA: Diagnosis not present

## 2015-12-26 DIAGNOSIS — I2510 Atherosclerotic heart disease of native coronary artery without angina pectoris: Secondary | ICD-10-CM

## 2015-12-26 DIAGNOSIS — K5900 Constipation, unspecified: Secondary | ICD-10-CM | POA: Diagnosis not present

## 2015-12-26 DIAGNOSIS — S72141S Displaced intertrochanteric fracture of right femur, sequela: Secondary | ICD-10-CM | POA: Diagnosis not present

## 2015-12-26 DIAGNOSIS — D62 Acute posthemorrhagic anemia: Secondary | ICD-10-CM | POA: Diagnosis not present

## 2015-12-26 NOTE — Progress Notes (Signed)
Patient ID: [name], male   DOB: 04/03/1932, 82 y.o.   MRN: [id number]    DATE:  12/26/2015   MRN:  [id number]  BIRTHDAY: 17-Jun-1932  Facility:  Nursing Home Location:  [place] Room Number: 101-P  LEVEL OF CARE:  SNF (31)  Contact Information    Name [place] Daughter [phone]  [phone]   Joni, Mitzner   [phone]   Ky[place], Arcia   [phone]      Chief Complaint  Patient presents with  . Hospitalization Follow-up    Right hip fracure S/P intramedullary fixation , diabetes mellitus type II, hyperlipidemia, hypertension, dementia, chronic  kidney disease stage III, BPH , aemia, CAD ,constipation and seizure   HISTORY OF PRESENT ILLNESS:   This is an 82 year old male who is for discharge home with home health PT and OT. He has been admitted to [facility] on 11/19/15 from [facility]. He has PMH of CAD, diabetes mellitus type 2, hypertension and rectal cancer. He had a mechanical fall sustaining a right hip fracture for which he had intramedullary fixation on 11/16/15.   Patient was admitted to this facility for short-term rehabilitation after the patient's recent hospitalization.  Patient has completed SNF rehabilitation and therapy has cleared the patient for discharge.  PAST MEDICAL HISTORY:  Past Medical History  Diagnosis Date  . Aortic root dilatation ([place])   . DM2 (diabetes mellitus, type 2) ([place])   . Stroke ([place])   . HLD (hyperlipidemia)   . HTN (hypertension)   . CAD (coronary artery disease)     Presumed from previously mildy abnormal myoview;  myoview 4/12: EF 73%, no ischemia  . Leukopenia   . Pneumothorax   . Rectal cancer ([place])   . Cardiomyopathy     a. echo 5/12: EF 45-50%, mild LVH, grade 2 diast dysfxn, RAE  . Colon cancer ([place])   . Chronic kidney disease     renal insuff, increased uric acid  . Seizures ([place])   . Diabetes mellitus   . BPH (benign  prostatic hyperplasia)     increased PSA  . Anemia      CURRENT MEDICATIONS: Reviewed  Patient's Medications  New Prescriptions   No medications on file  Previous Medications   ACETAMINOPHEN (TYLENOL) 325 MG TABLET    Take 650 mg by mouth every 4 (four) hours as needed.   AMLODIPINE (NORVASC) 10 MG TABLET    Take 10 mg by mouth daily.   ASPIRIN EC 325 MG TABLET    Take 1 tablet (325 mg total) by mouth 2 (two) times daily after a meal.   B-D INS SYR MICROFINE .5CC/28G 28G X 1/2" 0.5 ML MISC    See admin instructions.   B-D INS SYR MICROFINE .5CC/28G 28G X 1/2" 0.5 ML MISC    USE AS DIRECTED   DONEPEZIL (ARICEPT) 10 MG TABLET    Take 10 mg by mouth daily.   DOXAZOSIN (CARDURA) 4 MG TABLET    TAKE 1 TABLET BY MOUTH AT BEDTIME.   ECHINACEA HERB PO    Take 1 tablet by mouth daily.   FERROUS SULFATE 325 (65 FE) MG TABLET    Take 325 mg by mouth 2 (two) times daily.   FINASTERIDE (PROSCAR) 5 MG TABLET    TAKE 1 TABLET BY MOUTH EVERY DAY   FUROSEMIDE (LASIX) 20 MG TABLET    TAKE 1 TABLET BY MOUTH EVERY DAY  GLUCOSE BLOOD (ONE TOUCH ULTRA TEST) TEST STRIP    Dx: E11.22   HYDROCODONE-ACETAMINOPHEN (NORCO) 5-325 MG TABLET    Take 1-2 tablets by mouth every 6 (six) hours as needed for moderate pain.   INSULIN REGULAR (NOVOLIN R,HUMULIN R) 100 UNITS/ML INJECTION    Inject 0-15 Units into the skin 3 (three) times daily before meals. < 70 hypoglycemic protocol; 71-120= )  ; 141-150= 2 units  ; 151-200 = 3 units;  201-250 = 5 units;  251-300 = 8 units;  301-350 = 11 units;  351- 400 = 15 units  ; >400 call MD/NP   LAMOTRIGINE (LAMICTAL) 100 MG TABLET    Take 100 mg by mouth 2 (two) times daily. And Lamotrigine 100 mg take 1 1/2 tab=150 mg PO Q HS   METOPROLOL SUCCINATE (TOPROL-XL) 25 MG 24 HR TABLET    TAKE 1 TABLET BY MOUTH EVERY DAY.   MISC. DEVICES MISC    Stair Lift.  Use as directed.  DX Code: S72.001A   NITROGLYCERIN (NITROSTAT) 0.4 MG SL TABLET    Place 1 tablet (0.4 mg total) under the tongue  every 5 (five) minutes as needed for chest pain.   NUTRITIONAL SUPPLEMENTS (JUICE PLUS FIBRE PO)    Take 2 tablets by mouth 2 (two) times daily.   PRAVASTATIN (PRAVACHOL) 20 MG TABLET    TAKE 1 TABLET BY MOUTH EVERY DAY   SENNOSIDES-DOCUSATE SODIUM (SENOKOT-S) 8.6-50 MG TABLET    Take 2 tablets by mouth at bedtime.  Modified Medications   No medications on file  Discontinued Medications   DOCUSATE SODIUM (COLACE) 100 MG CAPSULE    Take 1 capsule (100 mg total) by mouth 2 (two) times daily.   SENNA (SENOKOT) 8.6 MG TABS TABLET    Take 1 tablet (8.6 mg total) by mouth 2 (two) times daily.     No Known Allergies   REVIEW OF SYSTEMS:  GENERAL: no change in appetite, no fatigue, no weight changes, no fever, chills or weakness EYES: Denies change in vision, dry eyes, eye pain, itching or discharge EARS: Denies change in hearing, ringing in ears, or earache NOSE: Denies nasal congestion or epistaxis MOUTH and THROAT: Denies oral discomfort, gingival pain or bleeding, pain from teeth or hoarseness   RESPIRATORY: no cough, SOB, DOE, wheezing, hemoptysis CARDIAC: no chest pain, edema or palpitations GI: no abdominal pain, diarrhea, constipation, heart burn, nausea or vomiting GU: Denies dysuria, frequency, hematuria, incontinence, or discharge PSYCHIATRIC: Denies feeling of depression or anxiety. No report of hallucinations, insomnia, paranoia, or agitation   PHYSICAL EXAMINATION  GENERAL APPEARANCE: Well nourished. In no acute distress. Normal body habitus SKIN:  Right femur surgical site is dry, no redness, covered with dry dressing HEAD: Normal in size and contour. No evidence of trauma EYES: Lids open and close normally. No blepharitis, entropion or ectropion. PERRL. Conjunctivae are clear and sclerae are white. Lenses are without opacity EARS: Pinnae are normal. Patient hears normal voice tunes of the examiner MOUTH and THROAT: Lips are without lesions. Oral mucosa is moist and without  lesions. Tongue is normal in shape, size, and color and without lesions NECK: supple, trachea midline, no neck masses, no thyroid tenderness, no thyromegaly LYMPHATICS: no LAN in the neck, no supraclavicular LAN RESPIRATORY: breathing is even & unlabored, BS CTAB CARDIAC: RRR, no murmur,no extra heart sounds, no edema GI: abdomen soft, normal BS, no masses, no tenderness, no hepatomegaly, no splenomegaly PSYCHIATRIC: Alert and oriented X 3. Affect and behavior are  appropriate  LABS/RADIOLOGY: Labs reviewed: 11/25/15  WBC 8.5 hemoglobin 7.8 hematocrit 25.4 MCV 84.48 left 352  11/21/15   WBC 6.0 hemoglobin 7.7 hematocrit 24.8 MCV 82.7 platelet 242 sodium 135 potassium 5.3 glucose 198 BUN 16 creatinine 1.15 calcium 8.3  Basic Metabolic Panel:  Recent Labs  11/17/15 1042 11/18/15 0835 11/19/15 0505  NA 134* 133* 133*  K 5.5* 5.2* 5.1  CL 104 103 99*  CO2 24 26 28   GLUCOSE 170* 210* 181*  BUN 20 15 16   CREATININE 1.87* 1.60* 1.42*  CALCIUM 7.9* 7.9* 8.1*   Liver Function Tests:  Recent Labs  04/02/15 1122  AST 13  ALT 11  ALKPHOS 115  BILITOT 0.43  PROT 6.7  ALBUMIN 3.9   CBC:  Recent Labs  10/13/15 1035 11/16/15 0008  11/18/15 0749 11/19/15 0505 12/25/15 1111  WBC 7.6 10.1  < > 10.1 6.7 7.3  NEUTROABS 4.5 8.2*  --   --   --  4.7  HGB 11.4* 10.5*  < > 7.5* 7.4* 11.4*  HCT 35.1* 33.6*  < > 24.5* 24.0* 35.3*  MCV 80.9 84.6  < > 84.8 84.5 83.6  PLT 261 216  < > 187 181 290  < > = values in this interval not displayed.  CBG:  Recent Labs  11/18/15 2142 11/19/15 0636 11/19/15 1126  GLUCAP 168* 171* 182*    No results found.  ASSESSMENT/PLAN:  Right hip fracture S/P intramedullary  fixation -  for Home health PT and OT; continue aspirin 325 mg EC 1 tab by mouth twice a day for DVT prophylaxis; Norco 5/325 mg 1-2 tabs by mouth every 6 hours when necessary for pain; follow-up with Dr. Lyla Glassing, orthopedic surgeon  Diabetes mellitus, type II - hemoglobin A1c  6.5 ; continue Humulin R sliding scale SQ 3 times a day before meals only, not to be given for CBG< 140   Hyperlipidemia - continue pravastatin 20 mg 1 tab by mouth daily  Hypertension - BP 147/70, increase Norvasc to 10 mg 1 tab by mouth daily, continue Toprol-XL 25 mg 1 tab by mouth daily and furosemide 20 mg 1 tab by mouth daily  Dementia - continue Aricept 10 mg 1 tab by mouth daily  Chronic kidney disease stage III - creatinine 1.42; re-check 1.15, improved  BPH - continue doxazosin mesylate 4 mg 1 tab by mouth daily at bedtime and finasteride 5 mg 1 tab by mouth daily  Anemia, acute blood loss - hemoglobin 7.4; re-check 7.7, improved; continue FeSO4 325 mg 1 tab BID  CAD - stable; continue aspirin, Toprol-XL and NTG when necessary  Constipation - continue Colace 100 mg 1 capsule by mouth twice a day and senna 1 tab by mouth twice a day  Seizures - continue lamotrigine 100 mg 1 tab by mouth BID and 1 1/2 tab = 150 mg Q HS      I have filled out patient's discharge paperwork and written prescriptions.  Patient will receive home health PT and OT.  Total discharge time: Greater than 30 minutes  Discharge time involved coordination of the discharge process with social worker, nursing staff and therapy department. Medical justification for home health services verified.    [facility], NP [employer] [phone]

## 2015-12-28 DIAGNOSIS — E1122 Type 2 diabetes mellitus with diabetic chronic kidney disease: Secondary | ICD-10-CM | POA: Diagnosis not present

## 2015-12-28 DIAGNOSIS — S72141D Displaced intertrochanteric fracture of right femur, subsequent encounter for closed fracture with routine healing: Secondary | ICD-10-CM | POA: Diagnosis not present

## 2015-12-28 DIAGNOSIS — N183 Chronic kidney disease, stage 3 (moderate): Secondary | ICD-10-CM | POA: Diagnosis not present

## 2015-12-28 DIAGNOSIS — I129 Hypertensive chronic kidney disease with stage 1 through stage 4 chronic kidney disease, or unspecified chronic kidney disease: Secondary | ICD-10-CM | POA: Diagnosis not present

## 2015-12-31 DIAGNOSIS — S72144D Nondisplaced intertrochanteric fracture of right femur, subsequent encounter for closed fracture with routine healing: Secondary | ICD-10-CM | POA: Diagnosis not present

## 2016-01-01 DIAGNOSIS — E1122 Type 2 diabetes mellitus with diabetic chronic kidney disease: Secondary | ICD-10-CM | POA: Diagnosis not present

## 2016-01-01 DIAGNOSIS — S72141D Displaced intertrochanteric fracture of right femur, subsequent encounter for closed fracture with routine healing: Secondary | ICD-10-CM | POA: Diagnosis not present

## 2016-01-01 DIAGNOSIS — I129 Hypertensive chronic kidney disease with stage 1 through stage 4 chronic kidney disease, or unspecified chronic kidney disease: Secondary | ICD-10-CM | POA: Diagnosis not present

## 2016-01-01 DIAGNOSIS — N183 Chronic kidney disease, stage 3 (moderate): Secondary | ICD-10-CM | POA: Diagnosis not present

## 2016-01-02 DIAGNOSIS — S72141D Displaced intertrochanteric fracture of right femur, subsequent encounter for closed fracture with routine healing: Secondary | ICD-10-CM | POA: Diagnosis not present

## 2016-01-02 DIAGNOSIS — N183 Chronic kidney disease, stage 3 (moderate): Secondary | ICD-10-CM | POA: Diagnosis not present

## 2016-01-02 DIAGNOSIS — E1122 Type 2 diabetes mellitus with diabetic chronic kidney disease: Secondary | ICD-10-CM | POA: Diagnosis not present

## 2016-01-02 DIAGNOSIS — I129 Hypertensive chronic kidney disease with stage 1 through stage 4 chronic kidney disease, or unspecified chronic kidney disease: Secondary | ICD-10-CM | POA: Diagnosis not present

## 2016-01-06 DIAGNOSIS — E1122 Type 2 diabetes mellitus with diabetic chronic kidney disease: Secondary | ICD-10-CM | POA: Diagnosis not present

## 2016-01-06 DIAGNOSIS — I129 Hypertensive chronic kidney disease with stage 1 through stage 4 chronic kidney disease, or unspecified chronic kidney disease: Secondary | ICD-10-CM | POA: Diagnosis not present

## 2016-01-06 DIAGNOSIS — S72141D Displaced intertrochanteric fracture of right femur, subsequent encounter for closed fracture with routine healing: Secondary | ICD-10-CM | POA: Diagnosis not present

## 2016-01-06 DIAGNOSIS — N183 Chronic kidney disease, stage 3 (moderate): Secondary | ICD-10-CM | POA: Diagnosis not present

## 2016-01-07 DIAGNOSIS — N183 Chronic kidney disease, stage 3 (moderate): Secondary | ICD-10-CM | POA: Diagnosis not present

## 2016-01-07 DIAGNOSIS — S72141D Displaced intertrochanteric fracture of right femur, subsequent encounter for closed fracture with routine healing: Secondary | ICD-10-CM | POA: Diagnosis not present

## 2016-01-07 DIAGNOSIS — I129 Hypertensive chronic kidney disease with stage 1 through stage 4 chronic kidney disease, or unspecified chronic kidney disease: Secondary | ICD-10-CM | POA: Diagnosis not present

## 2016-01-07 DIAGNOSIS — E1122 Type 2 diabetes mellitus with diabetic chronic kidney disease: Secondary | ICD-10-CM | POA: Diagnosis not present

## 2016-01-08 DIAGNOSIS — S72141D Displaced intertrochanteric fracture of right femur, subsequent encounter for closed fracture with routine healing: Secondary | ICD-10-CM | POA: Diagnosis not present

## 2016-01-08 DIAGNOSIS — N183 Chronic kidney disease, stage 3 (moderate): Secondary | ICD-10-CM | POA: Diagnosis not present

## 2016-01-08 DIAGNOSIS — I129 Hypertensive chronic kidney disease with stage 1 through stage 4 chronic kidney disease, or unspecified chronic kidney disease: Secondary | ICD-10-CM | POA: Diagnosis not present

## 2016-01-08 DIAGNOSIS — E1122 Type 2 diabetes mellitus with diabetic chronic kidney disease: Secondary | ICD-10-CM | POA: Diagnosis not present

## 2016-01-09 DIAGNOSIS — I129 Hypertensive chronic kidney disease with stage 1 through stage 4 chronic kidney disease, or unspecified chronic kidney disease: Secondary | ICD-10-CM | POA: Diagnosis not present

## 2016-01-09 DIAGNOSIS — E1122 Type 2 diabetes mellitus with diabetic chronic kidney disease: Secondary | ICD-10-CM | POA: Diagnosis not present

## 2016-01-09 DIAGNOSIS — S72141D Displaced intertrochanteric fracture of right femur, subsequent encounter for closed fracture with routine healing: Secondary | ICD-10-CM | POA: Diagnosis not present

## 2016-01-09 DIAGNOSIS — N183 Chronic kidney disease, stage 3 (moderate): Secondary | ICD-10-CM | POA: Diagnosis not present

## 2016-01-12 ENCOUNTER — Telehealth: Payer: Self-pay | Admitting: Family Medicine

## 2016-01-12 NOTE — Telephone Encounter (Signed)
lmom of new appt date with Dr Tamala Julian she has moved all of her appt to Tuesdays now

## 2016-01-13 DIAGNOSIS — E1122 Type 2 diabetes mellitus with diabetic chronic kidney disease: Secondary | ICD-10-CM | POA: Diagnosis not present

## 2016-01-13 DIAGNOSIS — N183 Chronic kidney disease, stage 3 (moderate): Secondary | ICD-10-CM | POA: Diagnosis not present

## 2016-01-13 DIAGNOSIS — I129 Hypertensive chronic kidney disease with stage 1 through stage 4 chronic kidney disease, or unspecified chronic kidney disease: Secondary | ICD-10-CM | POA: Diagnosis not present

## 2016-01-13 DIAGNOSIS — S72141D Displaced intertrochanteric fracture of right femur, subsequent encounter for closed fracture with routine healing: Secondary | ICD-10-CM | POA: Diagnosis not present

## 2016-01-14 DIAGNOSIS — E1122 Type 2 diabetes mellitus with diabetic chronic kidney disease: Secondary | ICD-10-CM | POA: Diagnosis not present

## 2016-01-14 DIAGNOSIS — I129 Hypertensive chronic kidney disease with stage 1 through stage 4 chronic kidney disease, or unspecified chronic kidney disease: Secondary | ICD-10-CM | POA: Diagnosis not present

## 2016-01-14 DIAGNOSIS — N183 Chronic kidney disease, stage 3 (moderate): Secondary | ICD-10-CM | POA: Diagnosis not present

## 2016-01-14 DIAGNOSIS — S72141D Displaced intertrochanteric fracture of right femur, subsequent encounter for closed fracture with routine healing: Secondary | ICD-10-CM | POA: Diagnosis not present

## 2016-01-15 DIAGNOSIS — S72141D Displaced intertrochanteric fracture of right femur, subsequent encounter for closed fracture with routine healing: Secondary | ICD-10-CM | POA: Diagnosis not present

## 2016-01-15 DIAGNOSIS — N183 Chronic kidney disease, stage 3 (moderate): Secondary | ICD-10-CM | POA: Diagnosis not present

## 2016-01-15 DIAGNOSIS — E1122 Type 2 diabetes mellitus with diabetic chronic kidney disease: Secondary | ICD-10-CM | POA: Diagnosis not present

## 2016-01-15 DIAGNOSIS — I129 Hypertensive chronic kidney disease with stage 1 through stage 4 chronic kidney disease, or unspecified chronic kidney disease: Secondary | ICD-10-CM | POA: Diagnosis not present

## 2016-01-16 DIAGNOSIS — I129 Hypertensive chronic kidney disease with stage 1 through stage 4 chronic kidney disease, or unspecified chronic kidney disease: Secondary | ICD-10-CM | POA: Diagnosis not present

## 2016-01-16 DIAGNOSIS — N183 Chronic kidney disease, stage 3 (moderate): Secondary | ICD-10-CM | POA: Diagnosis not present

## 2016-01-16 DIAGNOSIS — S72141D Displaced intertrochanteric fracture of right femur, subsequent encounter for closed fracture with routine healing: Secondary | ICD-10-CM | POA: Diagnosis not present

## 2016-01-16 DIAGNOSIS — E1122 Type 2 diabetes mellitus with diabetic chronic kidney disease: Secondary | ICD-10-CM | POA: Diagnosis not present

## 2016-01-20 DIAGNOSIS — N183 Chronic kidney disease, stage 3 (moderate): Secondary | ICD-10-CM | POA: Diagnosis not present

## 2016-01-20 DIAGNOSIS — E1122 Type 2 diabetes mellitus with diabetic chronic kidney disease: Secondary | ICD-10-CM | POA: Diagnosis not present

## 2016-01-20 DIAGNOSIS — S72141D Displaced intertrochanteric fracture of right femur, subsequent encounter for closed fracture with routine healing: Secondary | ICD-10-CM | POA: Diagnosis not present

## 2016-01-20 DIAGNOSIS — I129 Hypertensive chronic kidney disease with stage 1 through stage 4 chronic kidney disease, or unspecified chronic kidney disease: Secondary | ICD-10-CM | POA: Diagnosis not present

## 2016-01-21 DIAGNOSIS — I129 Hypertensive chronic kidney disease with stage 1 through stage 4 chronic kidney disease, or unspecified chronic kidney disease: Secondary | ICD-10-CM | POA: Diagnosis not present

## 2016-01-21 DIAGNOSIS — N183 Chronic kidney disease, stage 3 (moderate): Secondary | ICD-10-CM | POA: Diagnosis not present

## 2016-01-21 DIAGNOSIS — E1122 Type 2 diabetes mellitus with diabetic chronic kidney disease: Secondary | ICD-10-CM | POA: Diagnosis not present

## 2016-01-21 DIAGNOSIS — S72141D Displaced intertrochanteric fracture of right femur, subsequent encounter for closed fracture with routine healing: Secondary | ICD-10-CM | POA: Diagnosis not present

## 2016-01-22 ENCOUNTER — Other Ambulatory Visit: Payer: Self-pay | Admitting: Emergency Medicine

## 2016-01-22 DIAGNOSIS — E1122 Type 2 diabetes mellitus with diabetic chronic kidney disease: Secondary | ICD-10-CM | POA: Diagnosis not present

## 2016-01-22 DIAGNOSIS — N183 Chronic kidney disease, stage 3 (moderate): Secondary | ICD-10-CM | POA: Diagnosis not present

## 2016-01-22 DIAGNOSIS — S72141D Displaced intertrochanteric fracture of right femur, subsequent encounter for closed fracture with routine healing: Secondary | ICD-10-CM | POA: Diagnosis not present

## 2016-01-22 DIAGNOSIS — I129 Hypertensive chronic kidney disease with stage 1 through stage 4 chronic kidney disease, or unspecified chronic kidney disease: Secondary | ICD-10-CM | POA: Diagnosis not present

## 2016-01-23 ENCOUNTER — Telehealth: Payer: Self-pay

## 2016-01-23 DIAGNOSIS — E1122 Type 2 diabetes mellitus with diabetic chronic kidney disease: Secondary | ICD-10-CM | POA: Diagnosis not present

## 2016-01-23 DIAGNOSIS — N183 Chronic kidney disease, stage 3 (moderate): Secondary | ICD-10-CM | POA: Diagnosis not present

## 2016-01-23 DIAGNOSIS — I129 Hypertensive chronic kidney disease with stage 1 through stage 4 chronic kidney disease, or unspecified chronic kidney disease: Secondary | ICD-10-CM | POA: Diagnosis not present

## 2016-01-23 DIAGNOSIS — S72141D Displaced intertrochanteric fracture of right femur, subsequent encounter for closed fracture with routine healing: Secondary | ICD-10-CM | POA: Diagnosis not present

## 2016-01-23 NOTE — Telephone Encounter (Signed)
Pharm called with pt at pharmacy. I gave him these RFs verbally.

## 2016-01-23 NOTE — Telephone Encounter (Signed)
duplicate

## 2016-01-25 ENCOUNTER — Other Ambulatory Visit: Payer: Self-pay | Admitting: Emergency Medicine

## 2016-01-25 ENCOUNTER — Other Ambulatory Visit: Payer: Self-pay | Admitting: Cardiology

## 2016-01-27 DIAGNOSIS — N183 Chronic kidney disease, stage 3 (moderate): Secondary | ICD-10-CM | POA: Diagnosis not present

## 2016-01-27 DIAGNOSIS — S72141D Displaced intertrochanteric fracture of right femur, subsequent encounter for closed fracture with routine healing: Secondary | ICD-10-CM | POA: Diagnosis not present

## 2016-01-27 DIAGNOSIS — E1122 Type 2 diabetes mellitus with diabetic chronic kidney disease: Secondary | ICD-10-CM | POA: Diagnosis not present

## 2016-01-27 DIAGNOSIS — I129 Hypertensive chronic kidney disease with stage 1 through stage 4 chronic kidney disease, or unspecified chronic kidney disease: Secondary | ICD-10-CM | POA: Diagnosis not present

## 2016-01-28 ENCOUNTER — Other Ambulatory Visit: Payer: Self-pay | Admitting: Cardiology

## 2016-01-28 DIAGNOSIS — N183 Chronic kidney disease, stage 3 (moderate): Secondary | ICD-10-CM | POA: Diagnosis not present

## 2016-01-28 DIAGNOSIS — I129 Hypertensive chronic kidney disease with stage 1 through stage 4 chronic kidney disease, or unspecified chronic kidney disease: Secondary | ICD-10-CM | POA: Diagnosis not present

## 2016-01-28 DIAGNOSIS — E1122 Type 2 diabetes mellitus with diabetic chronic kidney disease: Secondary | ICD-10-CM | POA: Diagnosis not present

## 2016-01-28 DIAGNOSIS — S72141D Displaced intertrochanteric fracture of right femur, subsequent encounter for closed fracture with routine healing: Secondary | ICD-10-CM | POA: Diagnosis not present

## 2016-01-28 NOTE — Telephone Encounter (Signed)
Rx(s) sent to pharmacy electronically. OV March 2017  

## 2016-01-29 DIAGNOSIS — S72141D Displaced intertrochanteric fracture of right femur, subsequent encounter for closed fracture with routine healing: Secondary | ICD-10-CM | POA: Diagnosis not present

## 2016-01-29 DIAGNOSIS — I129 Hypertensive chronic kidney disease with stage 1 through stage 4 chronic kidney disease, or unspecified chronic kidney disease: Secondary | ICD-10-CM | POA: Diagnosis not present

## 2016-01-29 DIAGNOSIS — E1122 Type 2 diabetes mellitus with diabetic chronic kidney disease: Secondary | ICD-10-CM | POA: Diagnosis not present

## 2016-01-29 DIAGNOSIS — N183 Chronic kidney disease, stage 3 (moderate): Secondary | ICD-10-CM | POA: Diagnosis not present

## 2016-01-30 DIAGNOSIS — N183 Chronic kidney disease, stage 3 (moderate): Secondary | ICD-10-CM | POA: Diagnosis not present

## 2016-01-30 DIAGNOSIS — S72141D Displaced intertrochanteric fracture of right femur, subsequent encounter for closed fracture with routine healing: Secondary | ICD-10-CM | POA: Diagnosis not present

## 2016-01-30 DIAGNOSIS — I129 Hypertensive chronic kidney disease with stage 1 through stage 4 chronic kidney disease, or unspecified chronic kidney disease: Secondary | ICD-10-CM | POA: Diagnosis not present

## 2016-01-30 DIAGNOSIS — E1122 Type 2 diabetes mellitus with diabetic chronic kidney disease: Secondary | ICD-10-CM | POA: Diagnosis not present

## 2016-02-03 DIAGNOSIS — N183 Chronic kidney disease, stage 3 (moderate): Secondary | ICD-10-CM | POA: Diagnosis not present

## 2016-02-03 DIAGNOSIS — I129 Hypertensive chronic kidney disease with stage 1 through stage 4 chronic kidney disease, or unspecified chronic kidney disease: Secondary | ICD-10-CM | POA: Diagnosis not present

## 2016-02-03 DIAGNOSIS — S72141D Displaced intertrochanteric fracture of right femur, subsequent encounter for closed fracture with routine healing: Secondary | ICD-10-CM | POA: Diagnosis not present

## 2016-02-03 DIAGNOSIS — E1122 Type 2 diabetes mellitus with diabetic chronic kidney disease: Secondary | ICD-10-CM | POA: Diagnosis not present

## 2016-02-05 DIAGNOSIS — I129 Hypertensive chronic kidney disease with stage 1 through stage 4 chronic kidney disease, or unspecified chronic kidney disease: Secondary | ICD-10-CM | POA: Diagnosis not present

## 2016-02-05 DIAGNOSIS — E1122 Type 2 diabetes mellitus with diabetic chronic kidney disease: Secondary | ICD-10-CM | POA: Diagnosis not present

## 2016-02-05 DIAGNOSIS — N183 Chronic kidney disease, stage 3 (moderate): Secondary | ICD-10-CM | POA: Diagnosis not present

## 2016-02-05 DIAGNOSIS — S72141D Displaced intertrochanteric fracture of right femur, subsequent encounter for closed fracture with routine healing: Secondary | ICD-10-CM | POA: Diagnosis not present

## 2016-02-10 DIAGNOSIS — Z794 Long term (current) use of insulin: Secondary | ICD-10-CM | POA: Diagnosis not present

## 2016-02-10 DIAGNOSIS — S72141D Displaced intertrochanteric fracture of right femur, subsequent encounter for closed fracture with routine healing: Secondary | ICD-10-CM | POA: Diagnosis not present

## 2016-02-10 DIAGNOSIS — I2510 Atherosclerotic heart disease of native coronary artery without angina pectoris: Secondary | ICD-10-CM | POA: Diagnosis not present

## 2016-02-10 DIAGNOSIS — W19XXXD Unspecified fall, subsequent encounter: Secondary | ICD-10-CM | POA: Diagnosis not present

## 2016-02-10 DIAGNOSIS — E1122 Type 2 diabetes mellitus with diabetic chronic kidney disease: Secondary | ICD-10-CM | POA: Diagnosis not present

## 2016-02-10 DIAGNOSIS — Z7982 Long term (current) use of aspirin: Secondary | ICD-10-CM | POA: Diagnosis not present

## 2016-02-10 DIAGNOSIS — Z9181 History of falling: Secondary | ICD-10-CM | POA: Diagnosis not present

## 2016-02-10 DIAGNOSIS — I129 Hypertensive chronic kidney disease with stage 1 through stage 4 chronic kidney disease, or unspecified chronic kidney disease: Secondary | ICD-10-CM | POA: Diagnosis not present

## 2016-02-10 DIAGNOSIS — N183 Chronic kidney disease, stage 3 (moderate): Secondary | ICD-10-CM | POA: Diagnosis not present

## 2016-02-10 DIAGNOSIS — Z96642 Presence of left artificial hip joint: Secondary | ICD-10-CM | POA: Diagnosis not present

## 2016-02-10 DIAGNOSIS — E785 Hyperlipidemia, unspecified: Secondary | ICD-10-CM | POA: Diagnosis not present

## 2016-02-11 DIAGNOSIS — Z4789 Encounter for other orthopedic aftercare: Secondary | ICD-10-CM | POA: Diagnosis not present

## 2016-02-11 DIAGNOSIS — S72144D Nondisplaced intertrochanteric fracture of right femur, subsequent encounter for closed fracture with routine healing: Secondary | ICD-10-CM | POA: Diagnosis not present

## 2016-02-12 DIAGNOSIS — I129 Hypertensive chronic kidney disease with stage 1 through stage 4 chronic kidney disease, or unspecified chronic kidney disease: Secondary | ICD-10-CM | POA: Diagnosis not present

## 2016-02-12 DIAGNOSIS — E1122 Type 2 diabetes mellitus with diabetic chronic kidney disease: Secondary | ICD-10-CM | POA: Diagnosis not present

## 2016-02-12 DIAGNOSIS — N183 Chronic kidney disease, stage 3 (moderate): Secondary | ICD-10-CM | POA: Diagnosis not present

## 2016-02-12 DIAGNOSIS — W19XXXD Unspecified fall, subsequent encounter: Secondary | ICD-10-CM | POA: Diagnosis not present

## 2016-02-12 DIAGNOSIS — E785 Hyperlipidemia, unspecified: Secondary | ICD-10-CM | POA: Diagnosis not present

## 2016-02-12 DIAGNOSIS — S72141D Displaced intertrochanteric fracture of right femur, subsequent encounter for closed fracture with routine healing: Secondary | ICD-10-CM | POA: Diagnosis not present

## 2016-02-12 DIAGNOSIS — Z794 Long term (current) use of insulin: Secondary | ICD-10-CM | POA: Diagnosis not present

## 2016-02-12 DIAGNOSIS — Z7982 Long term (current) use of aspirin: Secondary | ICD-10-CM | POA: Diagnosis not present

## 2016-02-12 DIAGNOSIS — Z9181 History of falling: Secondary | ICD-10-CM | POA: Diagnosis not present

## 2016-02-12 DIAGNOSIS — Z96642 Presence of left artificial hip joint: Secondary | ICD-10-CM | POA: Diagnosis not present

## 2016-02-12 DIAGNOSIS — I2510 Atherosclerotic heart disease of native coronary artery without angina pectoris: Secondary | ICD-10-CM | POA: Diagnosis not present

## 2016-02-14 ENCOUNTER — Other Ambulatory Visit: Payer: Self-pay | Admitting: Adult Health

## 2016-02-17 DIAGNOSIS — Z9181 History of falling: Secondary | ICD-10-CM | POA: Diagnosis not present

## 2016-02-17 DIAGNOSIS — E1122 Type 2 diabetes mellitus with diabetic chronic kidney disease: Secondary | ICD-10-CM | POA: Diagnosis not present

## 2016-02-17 DIAGNOSIS — I129 Hypertensive chronic kidney disease with stage 1 through stage 4 chronic kidney disease, or unspecified chronic kidney disease: Secondary | ICD-10-CM | POA: Diagnosis not present

## 2016-02-17 DIAGNOSIS — Z794 Long term (current) use of insulin: Secondary | ICD-10-CM | POA: Diagnosis not present

## 2016-02-17 DIAGNOSIS — S72141D Displaced intertrochanteric fracture of right femur, subsequent encounter for closed fracture with routine healing: Secondary | ICD-10-CM | POA: Diagnosis not present

## 2016-02-17 DIAGNOSIS — E785 Hyperlipidemia, unspecified: Secondary | ICD-10-CM | POA: Diagnosis not present

## 2016-02-17 DIAGNOSIS — W19XXXD Unspecified fall, subsequent encounter: Secondary | ICD-10-CM | POA: Diagnosis not present

## 2016-02-17 DIAGNOSIS — I2510 Atherosclerotic heart disease of native coronary artery without angina pectoris: Secondary | ICD-10-CM | POA: Diagnosis not present

## 2016-02-17 DIAGNOSIS — N183 Chronic kidney disease, stage 3 (moderate): Secondary | ICD-10-CM | POA: Diagnosis not present

## 2016-02-17 DIAGNOSIS — Z7982 Long term (current) use of aspirin: Secondary | ICD-10-CM | POA: Diagnosis not present

## 2016-02-17 DIAGNOSIS — Z96642 Presence of left artificial hip joint: Secondary | ICD-10-CM | POA: Diagnosis not present

## 2016-02-19 DIAGNOSIS — I2510 Atherosclerotic heart disease of native coronary artery without angina pectoris: Secondary | ICD-10-CM | POA: Diagnosis not present

## 2016-02-19 DIAGNOSIS — S72141D Displaced intertrochanteric fracture of right femur, subsequent encounter for closed fracture with routine healing: Secondary | ICD-10-CM | POA: Diagnosis not present

## 2016-02-19 DIAGNOSIS — Z7982 Long term (current) use of aspirin: Secondary | ICD-10-CM | POA: Diagnosis not present

## 2016-02-19 DIAGNOSIS — E1122 Type 2 diabetes mellitus with diabetic chronic kidney disease: Secondary | ICD-10-CM | POA: Diagnosis not present

## 2016-02-19 DIAGNOSIS — Z794 Long term (current) use of insulin: Secondary | ICD-10-CM | POA: Diagnosis not present

## 2016-02-19 DIAGNOSIS — Z96642 Presence of left artificial hip joint: Secondary | ICD-10-CM | POA: Diagnosis not present

## 2016-02-19 DIAGNOSIS — I129 Hypertensive chronic kidney disease with stage 1 through stage 4 chronic kidney disease, or unspecified chronic kidney disease: Secondary | ICD-10-CM | POA: Diagnosis not present

## 2016-02-19 DIAGNOSIS — W19XXXD Unspecified fall, subsequent encounter: Secondary | ICD-10-CM | POA: Diagnosis not present

## 2016-02-19 DIAGNOSIS — N183 Chronic kidney disease, stage 3 (moderate): Secondary | ICD-10-CM | POA: Diagnosis not present

## 2016-02-19 DIAGNOSIS — E785 Hyperlipidemia, unspecified: Secondary | ICD-10-CM | POA: Diagnosis not present

## 2016-02-19 DIAGNOSIS — Z9181 History of falling: Secondary | ICD-10-CM | POA: Diagnosis not present

## 2016-02-24 DIAGNOSIS — M6281 Muscle weakness (generalized): Secondary | ICD-10-CM | POA: Diagnosis not present

## 2016-02-24 DIAGNOSIS — M25651 Stiffness of right hip, not elsewhere classified: Secondary | ICD-10-CM | POA: Diagnosis not present

## 2016-02-24 DIAGNOSIS — R262 Difficulty in walking, not elsewhere classified: Secondary | ICD-10-CM | POA: Diagnosis not present

## 2016-02-26 DIAGNOSIS — M6281 Muscle weakness (generalized): Secondary | ICD-10-CM | POA: Diagnosis not present

## 2016-02-26 DIAGNOSIS — R262 Difficulty in walking, not elsewhere classified: Secondary | ICD-10-CM | POA: Diagnosis not present

## 2016-02-26 DIAGNOSIS — M25651 Stiffness of right hip, not elsewhere classified: Secondary | ICD-10-CM | POA: Diagnosis not present

## 2016-02-26 NOTE — Progress Notes (Signed)
HPI: FU presumed CAD based upon an abnormal stress test in the past as well as diabetes, hypertension, hyperlipidemia, prior stroke, aortic root dilatation and rectal carcinoma. Patient had an echocardiogram during an admission in May of 2012 for sepsis. His ejection fraction was 45-50%. 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 Most recent nuclear study November 2015 showed ejection fraction 62% and normal perfusion. Last echocardiogram December 2016 showed normal LV function, grade 1 diastolic dysfunction. Patient had hip fracture December 2016. Since I last saw him, There is no dyspnea, chest pain, palpitations or syncope.  Current Outpatient Prescriptions  Medication Sig Dispense Refill  . acetaminophen (TYLENOL) 325 MG tablet Take 650 mg by mouth every 4 (four) hours as needed.    Marland Kitchen amLODipine (NORVASC) 10 MG tablet Take 10 mg by mouth daily.    Marland Kitchen amLODipine (NORVASC) 5 MG tablet TAKE 1 TABLET BY MOUTH EVERY DAY 30 tablet 1  . aspirin EC 325 MG tablet Take 1 tablet (325 mg total) by mouth 2 (two) times daily after a meal. 60 tablet 0  . B-D INS SYR MICROFINE .5CC/28G 28G X 1/2" 0.5 ML MISC See admin instructions.  3  . B-D INS SYR MICROFINE .5CC/28G 28G X 1/2" 0.5 ML MISC USE AS DIRECTED 100 each 9  . donepezil (ARICEPT) 10 MG tablet Take 10 mg by mouth daily.  1  . doxazosin (CARDURA) 4 MG tablet TAKE 1 TABLET BY MOUTH AT BEDTIME. 90 tablet 0  . ECHINACEA HERB PO Take 1 tablet by mouth daily.    . ferrous sulfate 325 (65 FE) MG tablet Take 325 mg by mouth 2 (two) times daily.    . finasteride (PROSCAR) 5 MG tablet TAKE 1 TABLET BY MOUTH EVERY DAY 30 tablet 4  . furosemide (LASIX) 20 MG tablet TAKE 1 TABLET BY MOUTH EVERY DAY 90 tablet 1  . glucose blood (ONE TOUCH ULTRA TEST) test strip Dx: E11.22 100 each 10  . glucose blood (ONE TOUCH ULTRA TEST) test strip Use to test blood sugar 3 times daily. Dx E11.22 300 each 3  . HYDROcodone-acetaminophen (NORCO) 5-325 MG  tablet Take 1-2 tablets by mouth every 6 (six) hours as needed for moderate pain. 80 tablet 0  . insulin regular (NOVOLIN R,HUMULIN R) 100 units/mL injection Inject 0-15 Units into the skin 3 (three) times daily before meals. < 70 hypoglycemic protocol; 71-120= )  ; 141-150= 2 units  ; 151-200 = 3 units;  201-250 = 5 units;  251-300 = 8 units;  301-350 = 11 units;  351- 400 = 15 units  ; >400 call MD/NP    . lamoTRIgine (LAMICTAL) 100 MG tablet Take 100 mg by mouth 2 (two) times daily. And Lamotrigine 100 mg take 1 1/2 tab=150 mg PO Q HS  3  . metoprolol succinate (TOPROL-XL) 25 MG 24 hr tablet TAKE 1 TABLET BY MOUTH EVERY DAY. 30 tablet 5  . Misc. Devices MISC Stair Lift.  Use as directed.  DX Code: [id number] 1 each 0  . nitroGLYCERIN (NITROSTAT) 0.4 MG SL tablet Place 1 tablet (0.4 mg total) under the tongue every 5 (five) minutes as needed for chest pain. 25 tablet 3  . Nutritional Supplements (JUICE PLUS FIBRE PO) Take 2 tablets by mouth 2 (two) times daily.    . pravastatin (PRAVACHOL) 20 MG tablet TAKE 1 TABLET BY MOUTH EVERY DAY 30 tablet 3  . sennosides-docusate sodium (SENOKOT-S) 8.6-50 MG tablet Take 2 tablets by  mouth at bedtime.     No current facility-administered medications for this visit.     Past Medical History  Diagnosis Date  . Aortic root dilatation ([place])   . DM2 (diabetes mellitus, type 2) ([place])   . Stroke ([place])   . HLD (hyperlipidemia)   . HTN (hypertension)   . CAD (coronary artery disease)     Presumed from previously mildy abnormal myoview;  myoview 4/12: EF 73%, no ischemia  . Leukopenia   . Pneumothorax   . Rectal cancer ([place])   . Cardiomyopathy     a. echo 5/12: EF 45-50%, mild LVH, grade 2 diast dysfxn, RAE  . Colon cancer ([place])   . Chronic kidney disease     renal insuff, increased uric acid  . Seizures ([place])   . Diabetes mellitus   . BPH (benign prostatic hyperplasia)     increased PSA  . Anemia     Past Surgical History  Procedure Laterality Date   . Hip arthroplasty    . Tonsillectomy    . S/p resection of rectal cancer    . Vasectomy    . Ileostomy  12/2010  . Reversal ileostomy  03/2011  . Colon surgery    . Joint replacement    . Femur im nail Right 11/16/2015    Procedure: INTRAMEDULLARY TROCHANTERIC HIP NAILING;  Surgeon: Rod Can, MD;  Location: [place];  Service: Orthopedics;  Laterality: Right;    Social History   Social History  . Marital Status: Widowed    Spouse Name: N/A  . Number of Children: N/A  . Years of Education: N/A   Occupational History  . retired    Social History Main Topics  . Smoking status: Never Smoker   . Smokeless tobacco: Never Used  . Alcohol Use: Yes  . Drug Use: No  . Sexual Activity: Not on file   Other Topics Concern  . Not on file   Social History Narrative    Family History  Problem Relation Age of Onset  . Alzheimer's disease    . Parkinsonism Other   . Parkinsonism Father   . Stomach cancer Neg Hx   . Colon cancer Neg Hx     ROS: no fevers or chills, productive cough, hemoptysis, dysphasia, odynophagia, melena, hematochezia, dysuria, hematuria, rash, seizure activity, orthopnea, PND, pedal edema, claudication. Remaining systems are negative.  Physical Exam: Well-developed well-nourished in no acute distress.  Skin is warm and dry.  HEENT is normal.  Neck is supple.  Chest is clear to auscultation with normal expansion.  Cardiovascular exam is regular rate and rhythm.  Abdominal exam nontender or distended. No masses palpated. Extremities show no edema. neuro grossly intact  ECG 11/16/2015-sinus rhythm with no ST changes.

## 2016-02-27 DIAGNOSIS — M6281 Muscle weakness (generalized): Secondary | ICD-10-CM | POA: Diagnosis not present

## 2016-02-27 DIAGNOSIS — M25651 Stiffness of right hip, not elsewhere classified: Secondary | ICD-10-CM | POA: Diagnosis not present

## 2016-02-27 DIAGNOSIS — R262 Difficulty in walking, not elsewhere classified: Secondary | ICD-10-CM | POA: Diagnosis not present

## 2016-03-01 ENCOUNTER — Other Ambulatory Visit: Payer: Self-pay | Admitting: Adult Health

## 2016-03-01 DIAGNOSIS — M25651 Stiffness of right hip, not elsewhere classified: Secondary | ICD-10-CM | POA: Diagnosis not present

## 2016-03-01 DIAGNOSIS — M6281 Muscle weakness (generalized): Secondary | ICD-10-CM | POA: Diagnosis not present

## 2016-03-01 DIAGNOSIS — R262 Difficulty in walking, not elsewhere classified: Secondary | ICD-10-CM | POA: Diagnosis not present

## 2016-03-02 ENCOUNTER — Encounter: Payer: Self-pay | Admitting: Cardiology

## 2016-03-02 ENCOUNTER — Ambulatory Visit (INDEPENDENT_AMBULATORY_CARE_PROVIDER_SITE_OTHER): Payer: Medicare Other | Admitting: Cardiology

## 2016-03-02 VITALS — BP 162/76 | HR 80 | Ht 75.0 in | Wt 205.0 lb

## 2016-03-02 DIAGNOSIS — I10 Essential (primary) hypertension: Secondary | ICD-10-CM | POA: Diagnosis not present

## 2016-03-02 DIAGNOSIS — E785 Hyperlipidemia, unspecified: Secondary | ICD-10-CM

## 2016-03-02 DIAGNOSIS — I2510 Atherosclerotic heart disease of native coronary artery without angina pectoris: Secondary | ICD-10-CM | POA: Diagnosis not present

## 2016-03-02 MED ORDER — METOPROLOL SUCCINATE ER 50 MG PO TB24: 50.0000 mg | ORAL_TABLET | Freq: Every day | ORAL | Status: DC

## 2016-03-02 NOTE — Assessment & Plan Note (Signed)
Continue aspirin and statin. 

## 2016-03-02 NOTE — Patient Instructions (Signed)
Medication Instructions:   INCREASE METOPROLOL TO 50 MG ONCE DAILY= 2 OF THE 25 MG TABLETS ONCE DAILY  Follow-Up:  Your physician wants you to follow-up in: ONE YEAR WITH DR CRENSHAW You will receive a reminder letter in the mail two months in advance. If you don't receive a letter, please call our office to schedule the follow-up appointment.   If you need a refill on your cardiac medications before your next appointment, please call your pharmacy.    

## 2016-03-02 NOTE — Assessment & Plan Note (Signed)
Continue statin. 

## 2016-03-02 NOTE — Assessment & Plan Note (Signed)
Blood pressure mildly elevated. Increase Toprol to 50 mg daily and follow.

## 2016-03-03 ENCOUNTER — Other Ambulatory Visit: Payer: Self-pay | Admitting: Physician Assistant

## 2016-03-03 DIAGNOSIS — L281 Prurigo nodularis: Secondary | ICD-10-CM | POA: Diagnosis not present

## 2016-03-03 DIAGNOSIS — R262 Difficulty in walking, not elsewhere classified: Secondary | ICD-10-CM | POA: Diagnosis not present

## 2016-03-03 DIAGNOSIS — M25651 Stiffness of right hip, not elsewhere classified: Secondary | ICD-10-CM | POA: Diagnosis not present

## 2016-03-03 DIAGNOSIS — D485 Neoplasm of uncertain behavior of skin: Secondary | ICD-10-CM | POA: Diagnosis not present

## 2016-03-03 DIAGNOSIS — C44629 Squamous cell carcinoma of skin of left upper limb, including shoulder: Secondary | ICD-10-CM | POA: Diagnosis not present

## 2016-03-03 DIAGNOSIS — M25512 Pain in left shoulder: Secondary | ICD-10-CM | POA: Diagnosis not present

## 2016-03-03 DIAGNOSIS — M6281 Muscle weakness (generalized): Secondary | ICD-10-CM | POA: Diagnosis not present

## 2016-03-05 DIAGNOSIS — R262 Difficulty in walking, not elsewhere classified: Secondary | ICD-10-CM | POA: Diagnosis not present

## 2016-03-05 DIAGNOSIS — M25512 Pain in left shoulder: Secondary | ICD-10-CM | POA: Diagnosis not present

## 2016-03-05 DIAGNOSIS — M25651 Stiffness of right hip, not elsewhere classified: Secondary | ICD-10-CM | POA: Diagnosis not present

## 2016-03-05 DIAGNOSIS — M6281 Muscle weakness (generalized): Secondary | ICD-10-CM | POA: Diagnosis not present

## 2016-03-08 ENCOUNTER — Other Ambulatory Visit: Payer: Self-pay | Admitting: Adult Health

## 2016-03-10 DIAGNOSIS — M6281 Muscle weakness (generalized): Secondary | ICD-10-CM | POA: Diagnosis not present

## 2016-03-10 DIAGNOSIS — R262 Difficulty in walking, not elsewhere classified: Secondary | ICD-10-CM | POA: Diagnosis not present

## 2016-03-10 DIAGNOSIS — M25651 Stiffness of right hip, not elsewhere classified: Secondary | ICD-10-CM | POA: Diagnosis not present

## 2016-03-12 ENCOUNTER — Other Ambulatory Visit: Payer: Self-pay | Admitting: Adult Health

## 2016-03-12 DIAGNOSIS — R262 Difficulty in walking, not elsewhere classified: Secondary | ICD-10-CM | POA: Diagnosis not present

## 2016-03-12 DIAGNOSIS — M25651 Stiffness of right hip, not elsewhere classified: Secondary | ICD-10-CM | POA: Diagnosis not present

## 2016-03-12 DIAGNOSIS — M6281 Muscle weakness (generalized): Secondary | ICD-10-CM | POA: Diagnosis not present

## 2016-03-12 DIAGNOSIS — M25512 Pain in left shoulder: Secondary | ICD-10-CM | POA: Diagnosis not present

## 2016-03-15 DIAGNOSIS — M25651 Stiffness of right hip, not elsewhere classified: Secondary | ICD-10-CM | POA: Diagnosis not present

## 2016-03-15 DIAGNOSIS — R262 Difficulty in walking, not elsewhere classified: Secondary | ICD-10-CM | POA: Diagnosis not present

## 2016-03-15 DIAGNOSIS — M6281 Muscle weakness (generalized): Secondary | ICD-10-CM | POA: Diagnosis not present

## 2016-03-16 ENCOUNTER — Other Ambulatory Visit: Payer: Self-pay | Admitting: Adult Health

## 2016-03-17 ENCOUNTER — Other Ambulatory Visit: Payer: Self-pay | Admitting: Emergency Medicine

## 2016-03-17 DIAGNOSIS — R262 Difficulty in walking, not elsewhere classified: Secondary | ICD-10-CM | POA: Diagnosis not present

## 2016-03-17 DIAGNOSIS — M6281 Muscle weakness (generalized): Secondary | ICD-10-CM | POA: Diagnosis not present

## 2016-03-17 DIAGNOSIS — M25651 Stiffness of right hip, not elsewhere classified: Secondary | ICD-10-CM | POA: Diagnosis not present

## 2016-03-19 DIAGNOSIS — R262 Difficulty in walking, not elsewhere classified: Secondary | ICD-10-CM | POA: Diagnosis not present

## 2016-03-19 DIAGNOSIS — M25651 Stiffness of right hip, not elsewhere classified: Secondary | ICD-10-CM | POA: Diagnosis not present

## 2016-03-19 DIAGNOSIS — M6281 Muscle weakness (generalized): Secondary | ICD-10-CM | POA: Diagnosis not present

## 2016-03-22 ENCOUNTER — Ambulatory Visit: Payer: Medicare Other | Admitting: Family Medicine

## 2016-03-22 ENCOUNTER — Other Ambulatory Visit: Payer: Self-pay | Admitting: Emergency Medicine

## 2016-03-22 DIAGNOSIS — M6281 Muscle weakness (generalized): Secondary | ICD-10-CM | POA: Diagnosis not present

## 2016-03-22 DIAGNOSIS — M25651 Stiffness of right hip, not elsewhere classified: Secondary | ICD-10-CM | POA: Diagnosis not present

## 2016-03-22 DIAGNOSIS — R262 Difficulty in walking, not elsewhere classified: Secondary | ICD-10-CM | POA: Diagnosis not present

## 2016-03-22 DIAGNOSIS — M25512 Pain in left shoulder: Secondary | ICD-10-CM | POA: Diagnosis not present

## 2016-03-24 ENCOUNTER — Telehealth: Payer: Self-pay | Admitting: Hematology

## 2016-03-24 ENCOUNTER — Ambulatory Visit: Payer: Medicare Other | Admitting: Podiatry

## 2016-03-24 DIAGNOSIS — M25651 Stiffness of right hip, not elsewhere classified: Secondary | ICD-10-CM | POA: Diagnosis not present

## 2016-03-24 DIAGNOSIS — M6281 Muscle weakness (generalized): Secondary | ICD-10-CM | POA: Diagnosis not present

## 2016-03-24 NOTE — Telephone Encounter (Signed)
pt daughter left voicemail wanting appt fot pt-cld daughter [name] back and adv pt had appt on 5/1-gave appt time @ 10

## 2016-03-26 DIAGNOSIS — R262 Difficulty in walking, not elsewhere classified: Secondary | ICD-10-CM | POA: Diagnosis not present

## 2016-03-26 DIAGNOSIS — M25512 Pain in left shoulder: Secondary | ICD-10-CM | POA: Diagnosis not present

## 2016-03-26 DIAGNOSIS — M6281 Muscle weakness (generalized): Secondary | ICD-10-CM | POA: Diagnosis not present

## 2016-03-27 ENCOUNTER — Other Ambulatory Visit: Payer: Self-pay | Admitting: Adult Health

## 2016-03-30 ENCOUNTER — Ambulatory Visit (INDEPENDENT_AMBULATORY_CARE_PROVIDER_SITE_OTHER): Payer: Medicare Other | Admitting: Family Medicine

## 2016-03-30 ENCOUNTER — Encounter: Payer: Self-pay | Admitting: Family Medicine

## 2016-03-30 VITALS — BP 162/66 | HR 83 | Temp 98.3°F | Resp 16 | Ht 75.0 in | Wt 214.4 lb

## 2016-03-30 DIAGNOSIS — D708 Other neutropenia: Secondary | ICD-10-CM

## 2016-03-30 DIAGNOSIS — I429 Cardiomyopathy, unspecified: Secondary | ICD-10-CM

## 2016-03-30 DIAGNOSIS — E7800 Pure hypercholesterolemia, unspecified: Secondary | ICD-10-CM | POA: Diagnosis not present

## 2016-03-30 DIAGNOSIS — E119 Type 2 diabetes mellitus without complications: Secondary | ICD-10-CM | POA: Diagnosis not present

## 2016-03-30 DIAGNOSIS — Z9049 Acquired absence of other specified parts of digestive tract: Secondary | ICD-10-CM | POA: Diagnosis not present

## 2016-03-30 DIAGNOSIS — F0390 Unspecified dementia without behavioral disturbance: Secondary | ICD-10-CM | POA: Diagnosis not present

## 2016-03-30 DIAGNOSIS — I2510 Atherosclerotic heart disease of native coronary artery without angina pectoris: Secondary | ICD-10-CM

## 2016-03-30 DIAGNOSIS — Z794 Long term (current) use of insulin: Secondary | ICD-10-CM | POA: Diagnosis not present

## 2016-03-30 DIAGNOSIS — E1122 Type 2 diabetes mellitus with diabetic chronic kidney disease: Secondary | ICD-10-CM | POA: Diagnosis not present

## 2016-03-30 DIAGNOSIS — Z85048 Personal history of other malignant neoplasm of rectum, rectosigmoid junction, and anus: Secondary | ICD-10-CM

## 2016-03-30 DIAGNOSIS — N183 Chronic kidney disease, stage 3 unspecified: Secondary | ICD-10-CM

## 2016-03-30 DIAGNOSIS — I10 Essential (primary) hypertension: Secondary | ICD-10-CM | POA: Diagnosis not present

## 2016-03-30 DIAGNOSIS — R569 Unspecified convulsions: Secondary | ICD-10-CM

## 2016-03-30 LAB — CBC WITH DIFFERENTIAL/PLATELET
BASOS ABS: 0 {cells}/uL (ref 0–200)
BASOS PCT: 0 %
EOS ABS: 77 {cells}/uL (ref 15–500)
Eosinophils Relative: 1 %
HEMATOCRIT: 37 % — AB (ref 38.5–50.0)
Hemoglobin: 11.5 g/dL — ABNORMAL LOW (ref 13.2–17.1)
LYMPHS ABS: 2618 {cells}/uL (ref 850–3900)
Lymphocytes Relative: 34 %
MCH: 25.7 pg — ABNORMAL LOW (ref 27.0–33.0)
MCHC: 31.1 g/dL — ABNORMAL LOW (ref 32.0–36.0)
MCV: 82.8 fL (ref 80.0–100.0)
MPV: 10.2 fL (ref 7.5–12.5)
Monocytes Absolute: 462 cells/uL (ref 200–950)
Monocytes Relative: 6 %
NEUTROS ABS: 4543 {cells}/uL (ref 1500–7800)
NEUTROS PCT: 59 %
PLATELETS: 277 10*3/uL (ref 140–400)
RBC: 4.47 MIL/uL (ref 4.20–5.80)
RDW: 15.2 % — AB (ref 11.0–15.0)
WBC: 7.7 10*3/uL (ref 3.8–10.8)

## 2016-03-30 MED ORDER — PRAVASTATIN SODIUM 20 MG PO TABS: 20.0000 mg | ORAL_TABLET | Freq: Every day | ORAL | Status: DC

## 2016-03-30 NOTE — Progress Notes (Signed)
Subjective:    Patient ID: SHY BRUSH, male    DOB: [birth year], 82 y.o.   MRN: [id number]  03/30/2016  Establish Care and Medication Refill   HPI This 82 y.o. male presents with daughter to establish care and for follow-up:  1. DMII:  Patient reports good compliance with medication, good tolerance to medication, and good symptom control.   Onset age 58.  Novolin R SSI; starts at 150.  Wants to avoid hypoglycemia. Checks sugar bid; does not check sugar middle of day;avoids lunchtime insulin.  Craves sweets.  Almost vegetarian.    2.  HTN: Patient reports good compliance with medication, good tolerance to medication, and good symptom control.   Amlodipine 10mg  daily.  3.  Hyperlipidemia: Patient reports good compliance with medication, good tolerance to medication, and good symptom control.    4.  Alzheimer's Dementia: taking Aricept 10mg  daily.  [place].  5.  Seizures: prescribes Lamictal 100mg  bid.  [place].  6.  BPH:  Taking Cardura and FInasteride.  7.  Chronic renal insufficiency anemia: ferrous sulfate bid.  8. Inguinal hernia R: will not undergo surgery.  9.  CAD/cardiomyopathy:  Lasix, Toprol XL, NTG.   Review of Systems  Constitutional: Negative for fever, chills, diaphoresis, activity change, appetite change and fatigue.  Respiratory: Negative for cough and shortness of breath.   Cardiovascular: Negative for chest pain, palpitations and leg swelling.  Gastrointestinal: Negative for nausea, vomiting, abdominal pain and diarrhea.  Endocrine: Negative for cold intolerance, heat intolerance, polydipsia, polyphagia and polyuria.  Skin: Negative for color change, rash and wound.  Neurological: Negative for dizziness, tremors, seizures, syncope, facial asymmetry, speech difficulty, weakness, light-headedness, numbness and headaches.  Psychiatric/Behavioral: Negative for sleep disturbance and dysphoric mood. The patient is not nervous/anxious.     Past Medical History    Diagnosis Date  . Aortic root dilatation ([place])   . DM2 (diabetes mellitus, type 2) ([place])   . Stroke ([place])   . HLD (hyperlipidemia)   . HTN (hypertension)   . CAD (coronary artery disease)     Presumed from previously mildy abnormal myoview;  myoview 4/12: EF 73%, no ischemia  . Leukopenia   . Pneumothorax   . Rectal cancer ([place])   . Cardiomyopathy     a. echo 5/12: EF 45-50%, mild LVH, grade 2 diast dysfxn, RAE  . Colon cancer ([place])   . Chronic kidney disease     renal insuff, increased uric acid  . Seizures ([place])   . Diabetes mellitus   . BPH (benign prostatic hyperplasia)     increased PSA  . Anemia    Past Surgical History  Procedure Laterality Date  . Hip arthroplasty    . Tonsillectomy    . S/p resection of rectal cancer    . Vasectomy    . Ileostomy  12/2010  . Reversal ileostomy  03/2011  . Colon surgery    . Joint replacement    . Femur im nail Right 11/16/2015    Procedure: INTRAMEDULLARY TROCHANTERIC HIP NAILING;  Surgeon: Rod Can, MD;  Location: [facility];  Service: Orthopedics;  Laterality: Right;   No Known Allergies Current Outpatient Prescriptions  Medication Sig Dispense Refill  . acetaminophen (TYLENOL) 325 MG tablet Take 650 mg by mouth every 4 (four) hours as needed.    Marland Kitchen amLODipine (NORVASC) 10 MG tablet Take 10 mg by mouth daily.    Marland Kitchen aspirin EC 325 MG tablet Take 1 tablet (325 mg total) by mouth 2 (two) times  daily after a meal. 60 tablet 0  . donepezil (ARICEPT) 10 MG tablet Take 10 mg by mouth daily.  1  . doxazosin (CARDURA) 4 MG tablet TAKE 1 TABLET BY MOUTH AT BEDTIME. 90 tablet 0  . ECHINACEA HERB PO Take 1 tablet by mouth daily.    . finasteride (PROSCAR) 5 MG tablet TAKE 1 TABLET BY MOUTH EVERY DAY 30 tablet 4  . furosemide (LASIX) 20 MG tablet TAKE 1 TABLET BY MOUTH EVERY DAY 90 tablet 1  . insulin regular (NOVOLIN R,HUMULIN R) 100 units/mL injection Inject 0-15 Units into the skin 3 (three) times daily before meals. < 70 hypoglycemic  protocol; 71-120= )  ; 141-150= 2 units  ; 151-200 = 3 units;  201-250 = 5 units;  251-300 = 8 units;  301-350 = 11 units;  351- 400 = 15 units  ; >400 call MD/NP    . lamoTRIgine (LAMICTAL) 100 MG tablet Take 100 mg by mouth 2 (two) times daily. And Lamotrigine 100 mg take 1 1/2 tab=150 mg PO Q HS  3  . metoprolol succinate (TOPROL-XL) 50 MG 24 hr tablet Take 1 tablet (50 mg total) by mouth daily. TAKE 1 TABLET BY MOUTH EVERY DAY. 90 tablet 3  . Misc. Devices MISC Stair Lift.  Use as directed.  DX Code: [id number] 1 each 0  . nitroGLYCERIN (NITROSTAT) 0.4 MG SL tablet Place 1 tablet (0.4 mg total) under the tongue every 5 (five) minutes as needed for chest pain. 25 tablet 3  . Nutritional Supplements (JUICE PLUS FIBRE PO) Take 2 tablets by mouth 2 (two) times daily.    . pravastatin (PRAVACHOL) 20 MG tablet Take 1 tablet (20 mg total) by mouth daily. 90 tablet 3  . B-D INS SYR MICROFINE .5CC/28G 28G X 1/2" 0.5 ML MISC See admin instructions.  3  . B-D INS SYR MICROFINE .5CC/28G 28G X 1/2" 0.5 ML MISC USE AS DIRECTED 100 each 9  . ferrous sulfate 325 (65 FE) MG tablet Take 325 mg by mouth 2 (two) times daily. Reported on 03/30/2016    . glucose blood (ONE TOUCH ULTRA TEST) test strip Dx: E11.22 100 each 10  . glucose blood (ONE TOUCH ULTRA TEST) test strip Use to test blood sugar 3 times daily. Dx E11.22 300 each 3  . sennosides-docusate sodium (SENOKOT-S) 8.6-50 MG tablet Take 2 tablets by mouth at bedtime. Reported on 03/30/2016     No current facility-administered medications for this visit.   Social History   Social History  . Marital Status: Widowed    Spouse Name: N/A  . Number of Children: 2  . Years of Education: N/A   Occupational History  . retired    Social History Main Topics  . Smoking status: Never Smoker   . Smokeless tobacco: Never Used  . Alcohol Use: Yes  . Drug Use: No  . Sexual Activity: Not on file   Other Topics Concern  . Not on file   Social History Narrative     Marital status: widowed.      Children:  2 children; 2 grandchildren; no gg      Lives; with daughter      Employed: retired; truck Geophysicist/field seismologist; [employer]      Alcohol: none now      Exercise:  None      ADLs: cane for ambulation; no driving in [id number];    Family History  Problem Relation Age of Onset  .  Alzheimer's disease    . Parkinsonism Other   . Parkinsonism Father   . Stomach cancer Neg Hx   . Colon cancer Neg Hx        Objective:    BP 162/66 mmHg  Pulse 83  Temp(Src) 98.3 F (36.8 C) (Oral)  Resp 16  Ht 6\' 3"  (1.905 m)  Wt 214 lb 6.4 oz (97.251 kg)  BMI 26.80 kg/m2  SpO2 93% Physical Exam  Constitutional: He is oriented to person, place, and time. He appears well-developed and well-nourished. No distress.  HENT:  Head: Normocephalic and atraumatic.  Right Ear: External ear normal.  Left Ear: External ear normal.  Nose: Nose normal.  Mouth/Throat: Oropharynx is clear and moist.  Eyes: Conjunctivae and EOM are normal. Pupils are equal, round, and reactive to light.  Neck: Normal range of motion. Neck supple. Carotid bruit is not present. No thyromegaly present.  Cardiovascular: Normal rate, regular rhythm, normal heart sounds and intact distal pulses.  Exam reveals no gallop and no friction rub.   No murmur heard. Pulmonary/Chest: Effort normal and breath sounds normal. He has no wheezes. He has no rales.  Abdominal: Soft. Bowel sounds are normal. He exhibits no distension and no mass. There is no tenderness. There is no rebound and no guarding.  Lymphadenopathy:    He has no cervical adenopathy.  Neurological: He is alert and oriented to person, place, and time. No cranial nerve deficit.  Skin: Skin is warm and dry. No rash noted. He is not diaphoretic.  Psychiatric: He has a normal mood and affect. His behavior is normal.  Nursing note and vitals reviewed.       Assessment & Plan:   1. 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 S/P left  colectomy   3. Personal history of malignant neoplasm of rectum, rectosigmoid junction, and anus   4. Cardiomyopathy ([place])   5. Seizures ([place])   6. Dementia, without behavioral disturbance   7. Chronic kidney disease, stage 3 (moderate)   8. Type 2 diabetes mellitus with HbA1C goal below 7.5   9. Type 2 diabetes mellitus with stage 3 chronic kidney disease, with long-term current use of insulin ([place])   10. Essential hypertension   11. Atherosclerosis of native coronary artery of native heart without angina pectoris   12. Pure hypercholesterolemia    -stable. -obtain labs. -no change in medications; refill provided.  Orders Placed This Encounter  Procedures  . CBC with Differential/Platelet  . Comprehensive metabolic panel    Order Specific Question:  Has the patient fasted?    Answer:  Yes  . Lipid panel    Order Specific Question:  Has the patient fasted?    Answer:  Yes  . TSH   Meds ordered this encounter  Medications  . pravastatin (PRAVACHOL) 20 MG tablet    Sig: Take 1 tablet (20 mg total) by mouth daily.    Dispense:  90 tablet    Refill:  3    PATIENT NEEDS CHOLESTEROL TEST    Return in about 3 months (around 06/29/2016) for recheck.    Braylee Bosher Elayne Guerin, M.D. [facility] [address] [place], [place]  [phone] phone [phone] fax

## 2016-03-30 NOTE — Patient Instructions (Signed)
     IF you received an x-ray today, you will receive an invoice from [facility]. Please contact [facility] at [phone] with questions or concerns regarding your invoice.   IF you received labwork today, you will receive an invoice from [facility]. Please contact [facility] at [phone] with questions or concerns regarding your invoice.   Our billing staff will not be able to assist you with questions regarding bills from these companies.  You will be contacted with the lab results as soon as they are available. The fastest way to get your results is to activate your My Chart account. Instructions are located on the last page of this paperwork. If you have not heard from us regarding the results in 2 weeks, please contact this office.      

## 2016-03-31 DIAGNOSIS — M25651 Stiffness of right hip, not elsewhere classified: Secondary | ICD-10-CM | POA: Diagnosis not present

## 2016-03-31 DIAGNOSIS — M6281 Muscle weakness (generalized): Secondary | ICD-10-CM | POA: Diagnosis not present

## 2016-03-31 DIAGNOSIS — R262 Difficulty in walking, not elsewhere classified: Secondary | ICD-10-CM | POA: Diagnosis not present

## 2016-03-31 LAB — COMPREHENSIVE METABOLIC PANEL
ALBUMIN: 4.2 g/dL (ref 3.6–5.1)
ALT: 9 U/L (ref 9–46)
AST: 13 U/L (ref 10–35)
Alkaline Phosphatase: 101 U/L (ref 40–115)
BILIRUBIN TOTAL: 0.3 mg/dL (ref 0.2–1.2)
BUN: 19 mg/dL (ref 7–25)
CO2: 28 mmol/L (ref 20–31)
CREATININE: 1.39 mg/dL — AB (ref 0.70–1.11)
Calcium: 8.7 mg/dL (ref 8.6–10.3)
Chloride: 98 mmol/L (ref 98–110)
Glucose, Bld: 195 mg/dL — ABNORMAL HIGH (ref 65–99)
Potassium: 4.9 mmol/L (ref 3.5–5.3)
SODIUM: 134 mmol/L — AB (ref 135–146)
TOTAL PROTEIN: 6.7 g/dL (ref 6.1–8.1)

## 2016-03-31 LAB — TSH: TSH: 2.08 mIU/L (ref 0.40–4.50)

## 2016-03-31 LAB — LIPID PANEL
CHOLESTEROL: 133 mg/dL (ref 125–200)
HDL: 32 mg/dL — ABNORMAL LOW (ref >=40)
LDL Cholesterol: 62 mg/dL (ref <130)
TRIGLYCERIDES: 193 mg/dL — AB (ref <150)
Total CHOL/HDL Ratio: 4.2 Ratio (ref <=5.0)
VLDL: 39 mg/dL — ABNORMAL HIGH (ref <30)

## 2016-04-05 DIAGNOSIS — M25512 Pain in left shoulder: Secondary | ICD-10-CM | POA: Diagnosis not present

## 2016-04-05 DIAGNOSIS — M6281 Muscle weakness (generalized): Secondary | ICD-10-CM | POA: Diagnosis not present

## 2016-04-05 DIAGNOSIS — R262 Difficulty in walking, not elsewhere classified: Secondary | ICD-10-CM | POA: Diagnosis not present

## 2016-04-07 DIAGNOSIS — M25651 Stiffness of right hip, not elsewhere classified: Secondary | ICD-10-CM | POA: Diagnosis not present

## 2016-04-07 DIAGNOSIS — R262 Difficulty in walking, not elsewhere classified: Secondary | ICD-10-CM | POA: Diagnosis not present

## 2016-04-07 DIAGNOSIS — M6281 Muscle weakness (generalized): Secondary | ICD-10-CM | POA: Diagnosis not present

## 2016-04-09 DIAGNOSIS — M25651 Stiffness of right hip, not elsewhere classified: Secondary | ICD-10-CM | POA: Diagnosis not present

## 2016-04-09 DIAGNOSIS — M25512 Pain in left shoulder: Secondary | ICD-10-CM | POA: Diagnosis not present

## 2016-04-09 DIAGNOSIS — R262 Difficulty in walking, not elsewhere classified: Secondary | ICD-10-CM | POA: Diagnosis not present

## 2016-04-09 DIAGNOSIS — M6281 Muscle weakness (generalized): Secondary | ICD-10-CM | POA: Diagnosis not present

## 2016-04-12 ENCOUNTER — Ambulatory Visit (HOSPITAL_BASED_OUTPATIENT_CLINIC_OR_DEPARTMENT_OTHER): Payer: Medicare Other | Admitting: Hematology

## 2016-04-12 ENCOUNTER — Other Ambulatory Visit: Payer: Medicare Other

## 2016-04-12 ENCOUNTER — Encounter: Payer: Self-pay | Admitting: Hematology

## 2016-04-12 ENCOUNTER — Telehealth: Payer: Self-pay | Admitting: Hematology

## 2016-04-12 VITALS — BP 152/62 | HR 67 | Temp 97.6°F | Resp 18 | Ht 75.0 in | Wt 214.7 lb

## 2016-04-12 DIAGNOSIS — D649 Anemia, unspecified: Secondary | ICD-10-CM | POA: Diagnosis not present

## 2016-04-12 DIAGNOSIS — F0390 Unspecified dementia without behavioral disturbance: Secondary | ICD-10-CM

## 2016-04-12 DIAGNOSIS — Z85038 Personal history of other malignant neoplasm of large intestine: Secondary | ICD-10-CM

## 2016-04-12 DIAGNOSIS — I10 Essential (primary) hypertension: Secondary | ICD-10-CM

## 2016-04-12 DIAGNOSIS — D708 Other neutropenia: Secondary | ICD-10-CM

## 2016-04-12 DIAGNOSIS — N183 Chronic kidney disease, stage 3 (moderate): Secondary | ICD-10-CM

## 2016-04-12 DIAGNOSIS — I2510 Atherosclerotic heart disease of native coronary artery without angina pectoris: Secondary | ICD-10-CM | POA: Diagnosis not present

## 2016-04-12 DIAGNOSIS — C187 Malignant neoplasm of sigmoid colon: Secondary | ICD-10-CM

## 2016-04-12 DIAGNOSIS — E119 Type 2 diabetes mellitus without complications: Secondary | ICD-10-CM

## 2016-04-12 NOTE — Telephone Encounter (Signed)
Gave pt appt for nov & avs

## 2016-04-12 NOTE — Telephone Encounter (Signed)
s.w. pt wife and advised on cx lab today...ok and aware....wife wanted to know why cx...transferred her to desk nurse

## 2016-04-12 NOTE — Progress Notes (Signed)
[facility] OFFICE PROGRESS NOTE  [name], MD [place] [id number]  DIAGNOSIS: Neutropenia   CURRENT THERAPY:  1. Neupogen 300 mcg subcu,  started in late December 2011, he was on 3-4 times weekly initially, and reduced to once weekly (Tuesday) since 12/13/2014, stopped on 03/19/2015 2. Cyclosporine 125 mg twice daily. Cyclosporine was started in August 2012, and stopped on 05/14/2015   INTERVAL HISTORY: [name] 82 y.o. male with a history of autoimmune neutropenia is here for follow-up.  He is doing well overall. He had a fall and hip fracture at the end of last year, had surgery and rehab. He had bad experience of his stay in the rehab. He eventually recovered well, no more pain, and is able to walk with a cane without much difficulty. No fever or infection episodes.   MEDICAL HISTORY: Past Medical History  Diagnosis Date  . Aortic root dilatation ([place])   . DM2 (diabetes mellitus, type 2) ([place])   . Stroke ([place])   . HLD (hyperlipidemia)   . HTN (hypertension)   . CAD (coronary artery disease)     Presumed from previously mildy abnormal myoview;  myoview 4/12: EF 73%, no ischemia  . Leukopenia   . Pneumothorax   . Rectal cancer ([place])   . Cardiomyopathy     a. echo 5/12: EF 45-50%, mild LVH, grade 2 diast dysfxn, RAE  . Colon cancer ([place])   . Chronic kidney disease     renal insuff, increased uric acid  . Seizures ([place])   . Diabetes mellitus   . BPH (benign prostatic hyperplasia)     increased PSA  . Anemia      ALLERGIES:  has No Known Allergies.  MEDICATIONS: has a current medication list which includes the following prescription(s): acetaminophen, amlodipine, aspirin ec, b-d ins syr microfine .5cc/28g, b-d ins syr microfine .5cc/28g, donepezil, doxazosin, echinacea, ferrous sulfate, finasteride, furosemide, glucose blood, glucose blood, insulin regular, lamotrigine, metoprolol succinate, misc. devices, nitroglycerin, nutritional  supplements, pravastatin, and sennosides-docusate sodium.  SURGICAL HISTORY:  Past Surgical History  Procedure Laterality Date  . Hip arthroplasty    . Tonsillectomy    . S/p resection of rectal cancer    . Vasectomy    . Ileostomy  12/2010  . Reversal ileostomy  03/2011  . Colon surgery    . Joint replacement    . Femur im nail Right 11/16/2015    Procedure: INTRAMEDULLARY TROCHANTERIC HIP NAILING;  Surgeon: Rod Can, MD;  Location: [facility];  Service: Orthopedics;  Laterality: Right;    REVIEW OF SYSTEMS:   Constitutional: Denies fevers, chills or abnormal weight loss Eyes: Denies blurriness of vision Ears, nose, mouth, throat, and face: Denies mucositis or sore throat Respiratory: Denies cough, dyspnea or wheezes Cardiovascular: Denies palpitation, chest discomfort or lower extremity swelling Gastrointestinal:  Denies nausea, heartburn or change in bowel habits Skin: Denies abnormal skin rashes Lymphatics: Denies new lymphadenopathy or easy bruising Neurological:Denies numbness, tingling or new weaknesses Behavioral/Psych: Mood is stable, no new changes  All other systems were reviewed with the patient and are negative.  PHYSICAL EXAMINATION: ECOG PERFORMANCE STATUS: 1  Blood pressure 152/62, pulse 67, temperature 97.6 F (36.4 C), temperature source Oral, resp. rate 18, height 6\' 3"  (1.905 m), weight 214 lb 11.2 oz (97.387 kg), SpO2 98 %.  GENERAL:alert, no distress and comfortable; chronically ill appearing.  SKIN: skin color, texture, turgor are normal, no rashes;  Chronic scaling of skin.  EYES:  normal, Conjunctiva are pink and non-injected, sclera clear OROPHARYNX:no exudate, no erythema and lips, buccal mucosa, and tongue normal  NECK: supple, thyroid normal size, non-tender, without nodularity LYMPH:  no palpable lymphadenopathy in the cervical, axillary or supraclavicular LUNGS: clear to auscultation and percussion with normal breathing effort HEART: regular rate  & rhythm and no murmurs and trace lower extremity edema bilaterally. ABDOMEN:abdomen soft, non-tender and normal bowel sounds Musculoskeletal:no cyanosis of digits and no clubbing; R arm with decreased ROM.  NEURO: alert & oriented x 3 with fluent speech, no focal motor/sensory deficits  LABORATORY DATA: CBC Latest Ref Rng 03/30/2016 12/25/2015 11/19/2015  WBC 3.8 - 10.8 K/uL 7.7 7.3 6.7  Hemoglobin 13.2 - 17.1 g/dL 11.5(L) 11.4(L) 7.4(L)  Hematocrit 38.5 - 50.0 % 37.0(L) 35.3(L) 24.0(L)  Platelets 140 - 400 K/uL 277 290 181    CMP Latest Ref Rng 03/30/2016 11/19/2015 11/18/2015  Glucose 65 - 99 mg/dL 195(H) 181(H) 210(H)  BUN 7 - 25 mg/dL 19 16 15   Creatinine 0.70 - 1.11 mg/dL 1.39(H) 1.42(H) 1.60(H)  Sodium 135 - 146 mmol/L 134(L) 133(L) 133(L)  Potassium 3.5 - 5.3 mmol/L 4.9 5.1 5.2(H)  Chloride 98 - 110 mmol/L 98 99(L) 103  CO2 20 - 31 mmol/L 28 28 26   Calcium 8.6 - 10.3 mg/dL 8.7 8.1(L) 7.9(L)  Total Protein 6.1 - 8.1 g/dL 6.7 - -  Total Bilirubin 0.2 - 1.2 mg/dL 0.3 - -  Alkaline Phos 40 - 115 U/L 101 - -  AST 10 - 35 U/L 13 - -  ALT 9 - 46 U/L 9 - -    RADIOGRAPHIC STUDIES: No results found.  ASSESSMENT: [name] 82 y.o. male with a history of  1. Autoimmune neutropenia. - He continues to do well overall.  His WBC and ANC has remained normal since he is off Neupogen and cyclosporine. -Continue monitoring.  2. 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 Verl Blalock 12/22/2011 without significant finding.  -continue follow up, CEA normal in 03/2015 -No evidence of recurrence. He is 5 years out of his initial diagnosis now, minimum risk of recurrence, no need routine follow up or surveillance scan   4. Anemia.  - Likely related to mild renal insufficiency. Stable -worse anemia in 11/2015 secondary to hip fracture and surgery, recovered very well     5. CKD stage III, dementia, HTN, DM, CAD  -Follow up with primary care  physician   Follow-up.  -6 months with lab   All questions were answered. The patient knows to call the clinic with any problems, questions or concerns. We can certainly see the patient much sooner if necessary.  I spent 10 minutes counseling the patient face to face. The total time spent in the appointment was 15 minutes.    Truitt Merle, MD 04/12/2016  6:49 AMy

## 2016-04-12 NOTE — Telephone Encounter (Signed)
per pof to sch pt appt-gave pt copy of avs-per Dr Alvy Bimler to sch lab 1 week b4 f/u

## 2016-04-14 ENCOUNTER — Ambulatory Visit (INDEPENDENT_AMBULATORY_CARE_PROVIDER_SITE_OTHER): Payer: Medicare Other | Admitting: Podiatry

## 2016-04-14 ENCOUNTER — Other Ambulatory Visit: Payer: Self-pay | Admitting: Emergency Medicine

## 2016-04-14 ENCOUNTER — Other Ambulatory Visit: Payer: Self-pay | Admitting: Cardiology

## 2016-04-14 ENCOUNTER — Encounter: Payer: Self-pay | Admitting: Podiatry

## 2016-04-14 DIAGNOSIS — M6281 Muscle weakness (generalized): Secondary | ICD-10-CM | POA: Diagnosis not present

## 2016-04-14 DIAGNOSIS — M25651 Stiffness of right hip, not elsewhere classified: Secondary | ICD-10-CM | POA: Diagnosis not present

## 2016-04-14 DIAGNOSIS — B351 Tinea unguium: Secondary | ICD-10-CM | POA: Diagnosis not present

## 2016-04-14 DIAGNOSIS — E1142 Type 2 diabetes mellitus with diabetic polyneuropathy: Secondary | ICD-10-CM

## 2016-04-14 DIAGNOSIS — M79673 Pain in unspecified foot: Secondary | ICD-10-CM

## 2016-04-14 DIAGNOSIS — R262 Difficulty in walking, not elsewhere classified: Secondary | ICD-10-CM | POA: Diagnosis not present

## 2016-04-14 NOTE — Patient Instructions (Signed)
Diabetes and Foot Care Diabetes may cause you to have problems because of poor blood supply (circulation) to your feet and legs. This may cause the skin on your feet to become thinner, break easier, and heal more slowly. Your skin may become dry, and the skin may peel and crack. You may also have nerve damage in your legs and feet causing decreased feeling in them. You may not notice minor injuries to your feet that could lead to infections or more serious problems. Taking care of your feet is one of the most important things you can do for yourself.  HOME CARE INSTRUCTIONS  Wear shoes at all times, even in the house. Do not go barefoot. Bare feet are easily injured.  Check your feet daily for blisters, cuts, and redness. If you cannot see the bottom of your feet, use a mirror or ask someone for help.  Wash your feet with warm water (do not use hot water) and mild soap. Then pat your feet and the areas between your toes until they are completely dry. Do not soak your feet as this can dry your skin.  Apply a moisturizing lotion or petroleum jelly (that does not contain alcohol and is unscented) to the skin on your feet and to dry, brittle toenails. Do not apply lotion between your toes.  Trim your toenails straight across. Do not dig under them or around the cuticle. File the edges of your nails with an emery board or nail file.  Do not cut corns or calluses or try to remove them with medicine.  Wear clean socks or stockings every day. Make sure they are not too tight. Do not wear knee-high stockings since they may decrease blood flow to your legs.  Wear shoes that fit properly and have enough cushioning. To break in new shoes, wear them for just a few hours a day. This prevents you from injuring your feet. Always look in your shoes before you put them on to be sure there are no objects inside.  Do not cross your legs. This may decrease the blood flow to your feet.  If you find a minor scrape,  cut, or break in the skin on your feet, keep it and the skin around it clean and dry. These areas may be cleansed with mild soap and water. Do not cleanse the area with peroxide, alcohol, or iodine.  When you remove an adhesive bandage, be sure not to damage the skin around it.  If you have a wound, look at it several times a day to make sure it is healing.  Do not use heating pads or hot water bottles. They may burn your skin. If you have lost feeling in your feet or legs, you may not know it is happening until it is too late.  Make sure your health care provider performs a complete foot exam at least annually or more often if you have foot problems. Report any cuts, sores, or bruises to your health care provider immediately. SEEK MEDICAL CARE IF:   You have an injury that is not healing.  You have cuts or breaks in the skin.  You have an ingrown nail.  You notice redness on your legs or feet.  You feel burning or tingling in your legs or feet.  You have pain or cramps in your legs and feet.  Your legs or feet are numb.  Your feet always feel cold. SEEK IMMEDIATE MEDICAL CARE IF:   There is increasing redness,   swelling, or pain in or around a wound.  There is a red line that goes up your leg.  Pus is coming from a wound.  You develop a fever or as directed by your health care provider.  You notice a bad smell coming from an ulcer or wound.   This information is not intended to replace advice given to you by your health care provider. Make sure you discuss any questions you have with your health care provider.   Document Released: 11/26/2000 Document Revised: 08/01/2013 Document Reviewed: 05/08/2013 Elsevier Interactive Patient Education 2016 [employer]  

## 2016-04-14 NOTE — Telephone Encounter (Signed)
REFILL 

## 2016-04-15 ENCOUNTER — Encounter: Payer: Self-pay | Admitting: Family Medicine

## 2016-04-15 NOTE — Progress Notes (Signed)
Patient ID: [name], male   DOB: March 15, 1932, 82 y.o.   MRN: [id number]   Subjective: This patient presents for scheduled visit complaining of thickened and elongated toenails 6-10 and requesting nail debridement. The patient's caregiver is present in the treatment room today.  Objective: No open skin lesions bilaterally The toenails are hypertrophic, elongated, deformed, discolored 6-10  Assessment: Mycotic toenails 6-10 Diabetic with peripheral neuropathy  Plan: Debridement toenails 6-10 mechanically and electrically without any bleeding  Reappoint 3 months

## 2016-04-16 DIAGNOSIS — R262 Difficulty in walking, not elsewhere classified: Secondary | ICD-10-CM | POA: Diagnosis not present

## 2016-04-16 DIAGNOSIS — M25651 Stiffness of right hip, not elsewhere classified: Secondary | ICD-10-CM | POA: Diagnosis not present

## 2016-04-16 DIAGNOSIS — M25512 Pain in left shoulder: Secondary | ICD-10-CM | POA: Diagnosis not present

## 2016-04-16 DIAGNOSIS — M6281 Muscle weakness (generalized): Secondary | ICD-10-CM | POA: Diagnosis not present

## 2016-04-19 DIAGNOSIS — M25651 Stiffness of right hip, not elsewhere classified: Secondary | ICD-10-CM | POA: Diagnosis not present

## 2016-04-19 DIAGNOSIS — M6281 Muscle weakness (generalized): Secondary | ICD-10-CM | POA: Diagnosis not present

## 2016-04-19 DIAGNOSIS — R262 Difficulty in walking, not elsewhere classified: Secondary | ICD-10-CM | POA: Diagnosis not present

## 2016-04-20 DIAGNOSIS — R2681 Unsteadiness on feet: Secondary | ICD-10-CM | POA: Diagnosis not present

## 2016-04-20 DIAGNOSIS — G301 Alzheimer's disease with late onset: Secondary | ICD-10-CM | POA: Diagnosis not present

## 2016-04-20 DIAGNOSIS — G40309 Generalized idiopathic epilepsy and epileptic syndromes, not intractable, without status epilepticus: Secondary | ICD-10-CM | POA: Diagnosis not present

## 2016-04-21 DIAGNOSIS — M6281 Muscle weakness (generalized): Secondary | ICD-10-CM | POA: Diagnosis not present

## 2016-04-21 DIAGNOSIS — M25651 Stiffness of right hip, not elsewhere classified: Secondary | ICD-10-CM | POA: Diagnosis not present

## 2016-04-21 DIAGNOSIS — R262 Difficulty in walking, not elsewhere classified: Secondary | ICD-10-CM | POA: Diagnosis not present

## 2016-04-23 DIAGNOSIS — R262 Difficulty in walking, not elsewhere classified: Secondary | ICD-10-CM | POA: Diagnosis not present

## 2016-04-23 DIAGNOSIS — M25651 Stiffness of right hip, not elsewhere classified: Secondary | ICD-10-CM | POA: Diagnosis not present

## 2016-04-23 DIAGNOSIS — M6281 Muscle weakness (generalized): Secondary | ICD-10-CM | POA: Diagnosis not present

## 2016-04-26 DIAGNOSIS — M25651 Stiffness of right hip, not elsewhere classified: Secondary | ICD-10-CM | POA: Diagnosis not present

## 2016-04-26 DIAGNOSIS — R262 Difficulty in walking, not elsewhere classified: Secondary | ICD-10-CM | POA: Diagnosis not present

## 2016-04-26 DIAGNOSIS — M6281 Muscle weakness (generalized): Secondary | ICD-10-CM | POA: Diagnosis not present

## 2016-04-29 DIAGNOSIS — M25651 Stiffness of right hip, not elsewhere classified: Secondary | ICD-10-CM | POA: Diagnosis not present

## 2016-04-29 DIAGNOSIS — M6281 Muscle weakness (generalized): Secondary | ICD-10-CM | POA: Diagnosis not present

## 2016-04-30 DIAGNOSIS — M6281 Muscle weakness (generalized): Secondary | ICD-10-CM | POA: Diagnosis not present

## 2016-04-30 DIAGNOSIS — M25651 Stiffness of right hip, not elsewhere classified: Secondary | ICD-10-CM | POA: Diagnosis not present

## 2016-04-30 DIAGNOSIS — R262 Difficulty in walking, not elsewhere classified: Secondary | ICD-10-CM | POA: Diagnosis not present

## 2016-05-03 DIAGNOSIS — R262 Difficulty in walking, not elsewhere classified: Secondary | ICD-10-CM | POA: Diagnosis not present

## 2016-05-03 DIAGNOSIS — M6281 Muscle weakness (generalized): Secondary | ICD-10-CM | POA: Diagnosis not present

## 2016-05-03 DIAGNOSIS — M25651 Stiffness of right hip, not elsewhere classified: Secondary | ICD-10-CM | POA: Diagnosis not present

## 2016-05-05 DIAGNOSIS — M6281 Muscle weakness (generalized): Secondary | ICD-10-CM | POA: Diagnosis not present

## 2016-05-05 DIAGNOSIS — M25651 Stiffness of right hip, not elsewhere classified: Secondary | ICD-10-CM | POA: Diagnosis not present

## 2016-05-05 DIAGNOSIS — R262 Difficulty in walking, not elsewhere classified: Secondary | ICD-10-CM | POA: Diagnosis not present

## 2016-05-07 DIAGNOSIS — M6281 Muscle weakness (generalized): Secondary | ICD-10-CM | POA: Diagnosis not present

## 2016-05-07 DIAGNOSIS — M25651 Stiffness of right hip, not elsewhere classified: Secondary | ICD-10-CM | POA: Diagnosis not present

## 2016-06-02 ENCOUNTER — Encounter: Payer: Self-pay | Admitting: Family Medicine

## 2016-06-02 ENCOUNTER — Ambulatory Visit (INDEPENDENT_AMBULATORY_CARE_PROVIDER_SITE_OTHER): Payer: Medicare Other | Admitting: Family Medicine

## 2016-06-02 ENCOUNTER — Ambulatory Visit (INDEPENDENT_AMBULATORY_CARE_PROVIDER_SITE_OTHER): Payer: Medicare Other

## 2016-06-02 VITALS — BP 130/80 | HR 101 | Temp 97.7°F | Resp 18 | Ht 75.0 in | Wt 217.0 lb

## 2016-06-02 DIAGNOSIS — R059 Cough, unspecified: Secondary | ICD-10-CM

## 2016-06-02 DIAGNOSIS — R05 Cough: Secondary | ICD-10-CM

## 2016-06-02 DIAGNOSIS — J069 Acute upper respiratory infection, unspecified: Secondary | ICD-10-CM

## 2016-06-02 DIAGNOSIS — Z794 Long term (current) use of insulin: Secondary | ICD-10-CM | POA: Diagnosis not present

## 2016-06-02 DIAGNOSIS — E119 Type 2 diabetes mellitus without complications: Secondary | ICD-10-CM | POA: Diagnosis not present

## 2016-06-02 LAB — COMPREHENSIVE METABOLIC PANEL
ALK PHOS: 113 U/L (ref 40–115)
ALT: 12 U/L (ref 9–46)
AST: 16 U/L (ref 10–35)
Albumin: 4.3 g/dL (ref 3.6–5.1)
BUN: 20 mg/dL (ref 7–25)
CO2: 27 mmol/L (ref 20–31)
CREATININE: 1.48 mg/dL — AB (ref 0.70–1.11)
Calcium: 9.2 mg/dL (ref 8.6–10.3)
Chloride: 103 mmol/L (ref 98–110)
Glucose, Bld: 137 mg/dL — ABNORMAL HIGH (ref 65–99)
Potassium: 5.1 mmol/L (ref 3.5–5.3)
SODIUM: 140 mmol/L (ref 135–146)
TOTAL PROTEIN: 7.1 g/dL (ref 6.1–8.1)
Total Bilirubin: 0.4 mg/dL (ref 0.2–1.2)

## 2016-06-02 LAB — POCT CBC
GRANULOCYTE PERCENT: 65.8 % (ref 37–80)
HCT, POC: 34.9 % — AB (ref 43.5–53.7)
Hemoglobin: 12.1 g/dL — AB (ref 14.1–18.1)
LYMPH, POC: 2.6 (ref 0.6–3.4)
MCH, POC: 28.4 pg (ref 27–31.2)
MCHC: 34.8 g/dL (ref 31.8–35.4)
MCV: 81.7 fL (ref 80–97)
MID (CBC): 0.5 (ref 0–0.9)
MPV: 7.3 fL (ref 0–99.8)
POC GRANULOCYTE: 6.1 (ref 2–6.9)
POC LYMPH %: 28.3 % (ref 10–50)
POC MID %: 5.9 %M (ref 0–12)
Platelet Count, POC: 217 10*3/uL (ref 142–424)
RBC: 4.27 M/uL — AB (ref 4.69–6.13)
RDW, POC: 14.7 %
WBC: 9.2 10*3/uL (ref 4.6–10.2)

## 2016-06-02 LAB — POCT GLYCOSYLATED HEMOGLOBIN (HGB A1C): HEMOGLOBIN A1C: 7.6

## 2016-06-02 LAB — POCT INFLUENZA A/B
INFLUENZA A, POC: NEGATIVE
Influenza B, POC: NEGATIVE

## 2016-06-02 LAB — GLUCOSE, POCT (MANUAL RESULT ENTRY): POC Glucose: 151 mg/dl — AB (ref 70–99)

## 2016-06-02 MED ORDER — AZITHROMYCIN 250 MG PO TABS: ORAL_TABLET | ORAL | Status: DC

## 2016-06-02 NOTE — Progress Notes (Signed)
Subjective:    Patient ID: [name], male    DOB: December 23, 1931, 82 y.o.   MRN: [id number]  06/02/2016  Cough   HPI This 82 y.o. male presents for evaluation of cough.  Daughter sick last week.  Onset yesterday last night.  No fever; no chills/sweats.  No headache.  +coughing.  No ear pain or sore throat.  +rhinorrhea; +nasal congestion clear.  +coughing none; no SOB.  +wheezing. +sneezing.  No n/v/d.  Took 2 Tylenol last night.  Cough medicine at 3:00am cherry flavored cough syrup over the counter. Sugar this morning 180 which is baseline.    Daughter with chills, bad headache.  Review of Systems  Constitutional: Positive for diaphoresis. Negative for fever, chills, activity change, appetite change and fatigue.  HENT: Positive for congestion, rhinorrhea and sneezing. Negative for ear pain and sore throat.   Respiratory: Positive for cough and wheezing. Negative for shortness of breath.   Cardiovascular: Negative for chest pain, palpitations and leg swelling.  Gastrointestinal: Negative for nausea, vomiting, abdominal pain and diarrhea.  Endocrine: Negative for cold intolerance, heat intolerance, polydipsia, polyphagia and polyuria.  Skin: Negative for color change, rash and wound.  Neurological: Negative for dizziness, tremors, seizures, syncope, facial asymmetry, speech difficulty, weakness, light-headedness, numbness and headaches.  Psychiatric/Behavioral: Negative for sleep disturbance and dysphoric mood. The patient is not nervous/anxious.     Past Medical History  Diagnosis Date  . Aortic root dilatation ([place])   . DM2 (diabetes mellitus, type 2) ([place])   . Stroke ([place])   . HLD (hyperlipidemia)   . HTN (hypertension)   . CAD (coronary artery disease)     Presumed from previously mildy abnormal myoview;  myoview 4/12: EF 73%, no ischemia  . Leukopenia   . Pneumothorax   . Rectal cancer ([place])   . Cardiomyopathy     a. echo 5/12: EF 45-50%, mild LVH, grade 2 diast dysfxn,  RAE  . Colon cancer ([place])   . Chronic kidney disease     renal insuff, increased uric acid  . Seizures ([place])   . Diabetes mellitus   . BPH (benign prostatic hyperplasia)     increased PSA  . Anemia    Past Surgical History  Procedure Laterality Date  . Hip arthroplasty    . Tonsillectomy    . S/p resection of rectal cancer    . Vasectomy    . Ileostomy  12/2010  . Reversal ileostomy  03/2011  . Colon surgery    . Joint replacement    . Femur im nail Right 11/16/2015    Procedure: INTRAMEDULLARY TROCHANTERIC HIP NAILING;  Surgeon: Rod Can, MD;  Location: [facility];  Service: Orthopedics;  Laterality: Right;   No Known Allergies  Social History   Social History  . Marital Status: Widowed    Spouse Name: N/A  . Number of Children: 2  . Years of Education: N/A   Occupational History  . retired    Social History Main Topics  . Smoking status: Never Smoker   . Smokeless tobacco: Never Used  . Alcohol Use: Yes  . Drug Use: No  . Sexual Activity: Not on file   Other Topics Concern  . Not on file   Social History Narrative   Marital status: widowed.      Children:  2 children; 2 grandchildren; no gg      Lives; with daughter      Employed: retired; truck Geophysicist/field seismologist; [employer]  Tobacco;never      Alcohol: none now      Exercise:  None      ADLs: cane for ambulation; no driving in [id number];    Family History  Problem Relation Age of Onset  . Alzheimer's disease    . Parkinsonism Other   . Parkinsonism Father   . Stomach cancer Neg Hx   . Colon cancer Neg Hx        Objective:    BP 130/80 mmHg  Pulse 101  Temp(Src) 97.7 F (36.5 C) (Oral)  Resp 18  Ht 6\' 3"  (1.905 m)  Wt 217 lb (98.431 kg)  BMI 27.12 kg/m2  SpO2 94% Physical Exam  Constitutional: He is oriented to person, place, and time. He appears well-developed and well-nourished. No distress.  HENT:  Head: Normocephalic and atraumatic.  Right Ear: External ear normal.  Left Ear: External  ear normal.  Nose: Nose normal.  Mouth/Throat: Oropharynx is clear and moist.  Eyes: Conjunctivae and EOM are normal. Pupils are equal, round, and reactive to light.  Neck: Normal range of motion. Neck supple. Carotid bruit is not present. No thyromegaly present.  Cardiovascular: Normal rate, regular rhythm, normal heart sounds and intact distal pulses.  Exam reveals no gallop and no friction rub.   No murmur heard. Pulmonary/Chest: Effort normal and breath sounds normal. He has no wheezes. He has no rales.  Abdominal: Soft. Bowel sounds are normal. He exhibits no distension and no mass. There is no tenderness. There is no rebound and no guarding.  Lymphadenopathy:    He has no cervical adenopathy.  Neurological: He is alert and oriented to person, place, and time. No cranial nerve deficit.  Skin: Skin is warm and dry. No rash noted. He is not diaphoretic.  Psychiatric: He has a normal mood and affect. His behavior is normal.  Nursing note and vitals reviewed.   No results found.     Assessment & Plan:   1. Acute upper respiratory infection   2. Cough   3. Type 2 diabetes mellitus without complication, with long-term current use of insulin ([place])     Orders Placed This Encounter  Procedures  . DG Chest 2 View    Standing Status: Future     Number of Occurrences: 1     Standing Expiration Date: 06/02/2017    Order Specific Question:  Reason for Exam (SYMPTOM  OR DIAGNOSIS REQUIRED)    Answer:  cough.    Order Specific Question:  Preferred imaging location?    Answer:  External  . Comprehensive metabolic panel  . POCT CBC  . POCT glucose (manual entry)  . POCT glycosylated hemoglobin (Hb A1C)  . POCT Influenza A/B   Meds ordered this encounter  Medications  . azithromycin (ZITHROMAX) 250 MG tablet    Sig: Two tablets daily x 1 day then one tablet daily x 4 days    Dispense:  6 tablet    Refill:  0    No Follow-up on file.    Eirik Schueler Elayne Guerin, M.D. [facility] [address] [place], [place]  [phone] phone [phone] fax

## 2016-06-02 NOTE — Patient Instructions (Addendum)
IF you received an x-ray today, you will receive an invoice from [facility]. Please contact [facility] at [phone] with questions or concerns regarding your invoice.   IF you received labwork today, you will receive an invoice from [facility]. Please contact [facility] at [phone] with questions or concerns regarding your invoice.   Our billing staff will not be able to assist you with questions regarding bills from these companies.  You will be contacted with the lab results as soon as they are available. The fastest way to get your results is to activate your My Chart account. Instructions are located on the last page of this paperwork. If you have not heard from us regarding the results in 2 weeks, please contact this office.   Upper Respiratory Infection, Adult Most upper respiratory infections (URIs) are a viral infection of the air passages leading to the lungs. A URI affects the nose, throat, and upper air passages. The most common type of URI is nasopharyngitis and is typically referred to as "the common cold." URIs run their course and usually go away on their own. Most of the time, a URI does not require medical attention, but sometimes a bacterial infection in the upper airways can follow a viral infection. This is called a secondary infection. Sinus and middle ear infections are common types of secondary upper respiratory infections. Bacterial pneumonia can also complicate a URI. A URI can worsen asthma and chronic obstructive pulmonary disease (COPD). Sometimes, these complications can require emergency medical care and may be life threatening.  CAUSES Almost all URIs are caused by viruses. A virus is a type of germ and can spread from one person to another.  RISKS FACTORS You may be at risk for a URI if:   You smoke.   You have chronic heart or lung disease.  You have a weakened defense (immune) system.   You are very young  or very old.   You have nasal allergies or asthma.  You work in crowded or poorly ventilated areas.  You work in health care facilities or schools. SIGNS AND SYMPTOMS  Symptoms typically develop 2-3 days after you come in contact with a cold virus. Most viral URIs last 7-10 days. However, viral URIs from the influenza virus (flu virus) can last 14-18 days and are typically more severe. Symptoms may include:   Runny or stuffy (congested) nose.   Sneezing.   Cough.   Sore throat.   Headache.   Fatigue.   Fever.   Loss of appetite.   Pain in your forehead, behind your eyes, and over your cheekbones (sinus pain).  Muscle aches.  DIAGNOSIS  Your health care provider may diagnose a URI by:  Physical exam.  Tests to check that your symptoms are not due to another condition such as:  Strep throat.  Sinusitis.  Pneumonia.  Asthma. TREATMENT  A URI goes away on its own with time. It cannot be cured with medicines, but medicines may be prescribed or recommended to relieve symptoms. Medicines may help:  Reduce your fever.  Reduce your cough.  Relieve nasal congestion. HOME CARE INSTRUCTIONS   Take medicines only as directed by your health care provider.   Gargle warm saltwater or take cough drops to comfort your throat as directed by your health care provider.  Use a warm mist humidifier or inhale steam from a shower to increase air moisture. This may make it easier to breathe.  Drink enough fluid to keep your   urine clear or pale yellow.   Eat soups and other clear broths and maintain good nutrition.   Rest as needed.   Return to work when your temperature has returned to normal or as your health care provider advises. You may need to stay home longer to avoid infecting others. You can also use a face mask and careful hand washing to prevent spread of the virus.  Increase the usage of your inhaler if you have asthma.   Do not use any tobacco  products, including cigarettes, chewing tobacco, or electronic cigarettes. If you need help quitting, ask your health care provider. PREVENTION  The best way to protect yourself from getting a cold is to practice good hygiene.   Avoid oral or hand contact with people with cold symptoms.   Wash your hands often if contact occurs.  There is no clear evidence that vitamin C, vitamin E, echinacea, or exercise reduces the chance of developing a cold. However, it is always recommended to get plenty of rest, exercise, and practice good nutrition.  SEEK MEDICAL CARE IF:   You are getting worse rather than better.   Your symptoms are not controlled by medicine.   You have chills.  You have worsening shortness of breath.  You have brown or red mucus.  You have yellow or brown nasal discharge.  You have pain in your face, especially when you bend forward.  You have a fever.  You have swollen neck glands.  You have pain while swallowing.  You have white areas in the back of your throat. SEEK IMMEDIATE MEDICAL CARE IF:   You have severe or persistent:  Headache.  Ear pain.  Sinus pain.  Chest pain.  You have chronic lung disease and any of the following:  Wheezing.  Prolonged cough.  Coughing up blood.  A change in your usual mucus.  You have a stiff neck.  You have changes in your:  Vision.  Hearing.  Thinking.  Mood. MAKE SURE YOU:   Understand these instructions.  Will watch your condition.  Will get help right away if you are not doing well or get worse.   This information is not intended to replace advice given to you by your health care provider. Make sure you discuss any questions you have with your health care provider.   Document Released: 05/25/2001 Document Revised: 04/15/2015 Document Reviewed: 03/06/2014 Elsevier Interactive Patient Education 2016 Elsevier Inc.  

## 2016-06-07 ENCOUNTER — Other Ambulatory Visit: Payer: Self-pay | Admitting: Adult Health

## 2016-06-08 ENCOUNTER — Encounter: Payer: Self-pay | Admitting: Family Medicine

## 2016-06-08 ENCOUNTER — Ambulatory Visit (INDEPENDENT_AMBULATORY_CARE_PROVIDER_SITE_OTHER): Payer: Medicare Other | Admitting: Family Medicine

## 2016-06-08 VITALS — BP 122/76 | HR 87 | Temp 98.0°F | Resp 16 | Ht 75.0 in | Wt 215.2 lb

## 2016-06-08 DIAGNOSIS — I429 Cardiomyopathy, unspecified: Secondary | ICD-10-CM

## 2016-06-08 DIAGNOSIS — N183 Chronic kidney disease, stage 3 unspecified: Secondary | ICD-10-CM

## 2016-06-08 DIAGNOSIS — E119 Type 2 diabetes mellitus without complications: Secondary | ICD-10-CM

## 2016-06-08 DIAGNOSIS — Z23 Encounter for immunization: Secondary | ICD-10-CM

## 2016-06-08 DIAGNOSIS — R569 Unspecified convulsions: Secondary | ICD-10-CM | POA: Diagnosis not present

## 2016-06-08 DIAGNOSIS — K4090 Unilateral inguinal hernia, without obstruction or gangrene, not specified as recurrent: Secondary | ICD-10-CM | POA: Diagnosis not present

## 2016-06-08 DIAGNOSIS — Z85048 Personal history of other malignant neoplasm of rectum, rectosigmoid junction, and anus: Secondary | ICD-10-CM

## 2016-06-08 DIAGNOSIS — F0390 Unspecified dementia without behavioral disturbance: Secondary | ICD-10-CM | POA: Diagnosis not present

## 2016-06-08 NOTE — Progress Notes (Signed)
Subjective:    Patient ID: [name], male    DOB: 09-27-1932, 82 y.o.   MRN: [id number]  06/08/2016  Follow-up   HPI This 82 y.o. male presents for evaluation of the following:   1. Hernia: has enlarged slightly over the past year; no pain.  Ten years ago, played golf with surgeon; recommended surgical repair; Dr. Everlene Farrier recommended no surgical intervention unless started hurting.  With riding bicycle, hernia gets in the way.   No pain.  Has really been enlarging in the past month.   Dr. Everlene Farrier recommended AGAINST hernia repair in the past due to age and comorbidities per daughter.  2. HTN: Patient reports good compliance with medication, good tolerance to medication, and good symptom control.  Amlodipine 5mg  daily.  Metoprolol ER.   3.  BPH: Patient reports good compliance with medication, good tolerance to medication, and good symptom control.  Cardura daily and Finasteride daily.   4. Dementia/seizure disorder: Patient reports good compliance with medication, good tolerance to medication, and good symptom control.  [place].  Last seizure 3 weeks ago.  Lamictal.  Every three months.    5.  Adenocarcinoma sigmoid: followed regularly.  colon cancer 2004.  6.  Leukemia:  Followed every three months.  7. Cataract surgery next week.    Review of Systems  Constitutional: Negative for fever, chills, diaphoresis, activity change, appetite change and fatigue.  Respiratory: Negative for cough and shortness of breath.   Cardiovascular: Negative for chest pain, palpitations and leg swelling.  Gastrointestinal: Negative for nausea, vomiting, abdominal pain, diarrhea, constipation, blood in stool, abdominal distention, anal bleeding and rectal pain.  Endocrine: Negative for cold intolerance, heat intolerance, polydipsia, polyphagia and polyuria.  Genitourinary: Positive for scrotal swelling. Negative for dysuria, urgency, frequency, hematuria, flank pain, decreased urine volume, discharge,  penile swelling, difficulty urinating, genital sores, penile pain and testicular pain.  Skin: Negative for color change, rash and wound.  Neurological: Negative for dizziness, tremors, seizures, syncope, facial asymmetry, speech difficulty, weakness, light-headedness, numbness and headaches.  Psychiatric/Behavioral: Negative for sleep disturbance and dysphoric mood. The patient is not nervous/anxious.     Past Medical History  Diagnosis Date  . Aortic root dilatation ([place])   . DM2 (diabetes mellitus, type 2) ([place])   . Stroke ([place])   . HLD (hyperlipidemia)   . HTN (hypertension)   . CAD (coronary artery disease)     Presumed from previously mildy abnormal myoview;  myoview 4/12: EF 73%, no ischemia  . Leukopenia   . Pneumothorax   . Rectal cancer ([place])   . Cardiomyopathy     a. echo 5/12: EF 45-50%, mild LVH, grade 2 diast dysfxn, RAE  . Colon cancer ([place])   . Chronic kidney disease     renal insuff, increased uric acid  . Seizures ([place])   . Diabetes mellitus   . BPH (benign prostatic hyperplasia)     increased PSA  . Anemia    Past Surgical History  Procedure Laterality Date  . Hip arthroplasty    . Tonsillectomy    . S/p resection of rectal cancer    . Vasectomy    . Ileostomy  12/2010  . Reversal ileostomy  03/2011  . Colon surgery    . Joint replacement    . Femur im nail Right 11/16/2015    Procedure: INTRAMEDULLARY TROCHANTERIC HIP NAILING;  Surgeon: Rod Can, MD;  Location: [facility];  Service: Orthopedics;  Laterality: Right;   No Known Allergies Current Outpatient  Prescriptions  Medication Sig Dispense Refill  . acetaminophen (TYLENOL) 325 MG tablet Take 650 mg by mouth every 4 (four) hours as needed. Reported on 04/12/2016    . amLODipine (NORVASC) 5 MG tablet TAKE 1 TABLET BY MOUTH EVERY DAY 30 tablet 11  . aspirin 81 MG tablet Take 81 mg by mouth daily.    Marland Kitchen azithromycin (ZITHROMAX) 250 MG tablet Two tablets daily x 1 day then one tablet daily x 4 days 6 tablet  0  . B-D INS SYR MICROFINE .5CC/28G 28G X 1/2" 0.5 ML MISC See admin instructions.  3  . B-D INS SYR MICROFINE .5CC/28G 28G X 1/2" 0.5 ML MISC USE AS DIRECTED 100 each 9  . donepezil (ARICEPT) 10 MG tablet Take 10 mg by mouth daily.  1  . doxazosin (CARDURA) 4 MG tablet TAKE 1 TABLET BY MOUTH AT BEDTIME. 90 tablet 0  . ECHINACEA HERB PO Take 1 tablet by mouth daily.    . ferrous sulfate 325 (65 FE) MG tablet Take 325 mg by mouth 2 (two) times daily. Reported on 03/30/2016    . finasteride (PROSCAR) 5 MG tablet TAKE 1 TABLET BY MOUTH EVERY DAY 30 tablet 4  . furosemide (LASIX) 20 MG tablet TAKE 1 TABLET BY MOUTH EVERY DAY 90 tablet 1  . glucose blood (ONE TOUCH ULTRA TEST) test strip Dx: E11.22 100 each 10  . glucose blood (ONE TOUCH ULTRA TEST) test strip Use to test blood sugar 3 times daily. Dx E11.22 300 each 3  . insulin regular (NOVOLIN R,HUMULIN R) 100 units/mL injection Inject 0-15 Units into the skin 3 (three) times daily before meals. < 70 hypoglycemic protocol; 71-120= )  ; 141-150= 2 units  ; 151-200 = 3 units;  201-250 = 5 units;  251-300 = 8 units;  301-350 = 11 units;  351- 400 = 15 units  ; >400 call MD/NP    . lamoTRIgine (LAMICTAL) 100 MG tablet Take 100 mg by mouth 3 (three) times daily. And Lamotrigine 100 mg  3  . metoprolol succinate (TOPROL-XL) 50 MG 24 hr tablet Take 1 tablet (50 mg total) by mouth daily. TAKE 1 TABLET BY MOUTH EVERY DAY. 90 tablet 3  . Misc. Devices MISC Stair Lift.  Use as directed.  DX Code: M2176304 1 each 0  . nitroGLYCERIN (NITROSTAT) 0.4 MG SL tablet Place 1 tablet (0.4 mg total) under the tongue every 5 (five) minutes as needed for chest pain. 25 tablet 3  . Nutritional Supplements (JUICE PLUS FIBRE PO) Take 2 tablets by mouth 2 (two) times daily. Reported on 04/12/2016    . pravastatin (PRAVACHOL) 20 MG tablet Take 1 tablet (20 mg total) by mouth daily. (Patient taking differently: Take 20 mg by mouth 2 (two) times daily. ) 90 tablet 3  .  sennosides-docusate sodium (SENOKOT-S) 8.6-50 MG tablet Take 2 tablets by mouth at bedtime. Reported on 03/30/2016     No current facility-administered medications for this visit.   Social History   Social History  . Marital Status: Widowed    Spouse Name: N/A  . Number of Children: 2  . Years of Education: N/A   Occupational History  . retired    Social History Main Topics  . Smoking status: Never Smoker   . Smokeless tobacco: Never Used  . Alcohol Use: Yes  . Drug Use: No  . Sexual Activity: Not on file   Other Topics Concern  . Not on file   Social History Narrative  Marital status: widowed.      Children:  2 children; 2 grandchildren; no gg      Lives; with daughter      Employed: retired; truck Geophysicist/field seismologist; trucking Administrator      Alcohol: none now      Exercise:  None      ADLs: cane for ambulation; no driving in [id number];    Family History  Problem Relation Age of Onset  . Alzheimer's disease    . Parkinsonism Other   . Parkinsonism Father   . Stomach cancer Neg Hx   . Colon cancer Neg Hx        Objective:    BP 122/76 mmHg  Pulse 87  Temp(Src) 98 F (36.7 C) (Oral)  Resp 16  Ht 6\' 3"  (1.905 m)  Wt 215 lb 3.2 oz (97.614 kg)  BMI 26.90 kg/m2  SpO2 95% Physical Exam  Constitutional: He is oriented to person, place, and time. He appears well-developed and well-nourished. No distress.  HENT:  Head: Normocephalic and atraumatic.  Right Ear: External ear normal.  Left Ear: External ear normal.  Nose: Nose normal.  Mouth/Throat: Oropharynx is clear and moist.  Eyes: Conjunctivae and EOM are normal. Pupils are equal, round, and reactive to light.  Neck: Normal range of motion. Neck supple. Carotid bruit is not present. No thyromegaly present.  Cardiovascular: Normal rate, regular rhythm, normal heart sounds and intact distal pulses.  Exam reveals no gallop and no friction rub.   No murmur heard. Pulmonary/Chest: Effort normal and breath  sounds normal. He has no wheezes. He has no rales.  Abdominal: Soft. Bowel sounds are normal. He exhibits no distension and no mass. There is no tenderness. There is no rebound and no guarding. A hernia is present. Hernia confirmed positive in the right inguinal area. Hernia confirmed negative in the left inguinal area.  R inguinal hernia large and non-reducible; non-tender.  Lymphadenopathy:    He has no cervical adenopathy.  Neurological: He is alert and oriented to person, place, and time. No cranial nerve deficit.  Skin: Skin is warm and dry. No rash noted. He is not diaphoretic.  Psychiatric: He has a normal mood and affect. His behavior is normal.  Nursing note and vitals reviewed.       Assessment & Plan:   1. Right inguinal hernia   2. History of rectal cancer   3. Cardiomyopathy ([place])   4. Seizures ([place])   5. Dementia, without behavioral disturbance   6. Chronic kidney disease, stage 3 (moderate)   7. Type 2 diabetes mellitus with HbA1C goal below 7.5   8. Need for prophylactic vaccination against Streptococcus pneumoniae (pneumococcus)    -do not recommend surgical repair of R inguinal hernia unless becomes symptomatic/painful.   Orders Placed This Encounter  Procedures  . Pneumococcal polysaccharide vaccine 23-valent greater than or equal to 2yo subcutaneous/IM   No orders of the defined types were placed in this encounter.    Return in about 4 months (around 10/08/2016) for recheck.    Kristi Elayne Guerin, M.D. [facility] [address] [place], [place]  [address] [phone] phone [phone] fax

## 2016-06-08 NOTE — Patient Instructions (Addendum)
IF you received an x-ray today, you will receive an invoice from [facility]. Please contact [facility] at [phone] with questions or concerns regarding your invoice.   IF you received labwork today, you will receive an invoice from [employer]. Please contact [facility] at [phone] with questions or concerns regarding your invoice.   Our billing staff will not be able to assist you with questions regarding bills from these companies.  You will be contacted with the lab results as soon as they are available. The fastest way to get your results is to activate your My Chart account. Instructions are located on the last page of this paperwork. If you have not heard from [place] regarding the results in 2 weeks, please contact this office.    Hernia, Adult A hernia is the bulging of an organ or tissue through a weak spot in the muscles of the abdomen (abdominal wall). Hernias develop most often near the navel or groin. There are many kinds of hernias. Common kinds include:  Femoral hernia. This kind of hernia develops under the groin in the upper thigh area.  Inguinal hernia. This kind of hernia develops in the groin or scrotum.  Umbilical hernia. This kind of hernia develops near the navel.  Hiatal hernia. This kind of hernia causes part of the stomach to be pushed up into the chest.  Incisional hernia. This kind of hernia bulges through a scar from an abdominal surgery. CAUSES This condition may be caused by:  Heavy lifting.  Coughing over a long period of time.  Straining to have a bowel movement.  An incision made during an abdominal surgery.  A birth defect (congenital defect).  Excess weight or obesity.  Smoking.  Poor nutrition.  Cystic fibrosis.  Excess fluid in the abdomen.  Undescended testicles. SYMPTOMS Symptoms of a hernia include:  A lump on the abdomen. This is the first sign of a hernia. The lump may  become more obvious with standing, straining, or coughing. It may get bigger over time if it is not treated or if the condition causing it is not treated.  Pain. A hernia is usually painless, but it may become painful over time if treatment is delayed. The pain is usually dull and may get worse with standing or lifting heavy objects. Sometimes a hernia gets tightly squeezed in the weak spot (strangulated) or stuck there (incarcerated) and causes additional symptoms. These symptoms may include:  Vomiting.  Nausea.  Constipation.  Irritability. DIAGNOSIS A hernia may be diagnosed with:  A physical exam. During the exam your health care provider may ask you to cough or to make a specific movement, because a hernia is usually more visible when you move.  Imaging tests. These can include:  X-rays.  Ultrasound.  CT scan. TREATMENT A hernia that is small and painless may not need to be treated. A hernia that is large or painful may be treated with surgery. Inguinal hernias may be treated with surgery to prevent incarceration or strangulation. Strangulated hernias are always treated with surgery, because lack of blood to the trapped organ or tissue can cause it to die. Surgery to treat a hernia involves pushing the bulge back into place and repairing the weak part of the abdomen. HOME CARE INSTRUCTIONS  Avoid straining.  Do not lift anything heavier than 10 lb (4.5 kg).  Lift with your leg muscles, not your back muscles. This helps avoid strain.  When coughing, try to cough gently.  Prevent constipation. Constipation leads to straining with bowel movements, which can make a hernia worse or cause a hernia repair to break down. You can prevent constipation by:  Eating a high-fiber diet that includes plenty of fruits and vegetables.  Drinking enough fluids to keep your urine clear or pale yellow. Aim to drink 6-8 glasses of water per day.  Using a stool softener as directed by your  health care provider.  Lose weight, if you are overweight.  Do not use any tobacco products, including cigarettes, chewing tobacco, or electronic cigarettes. If you need help quitting, ask your health care provider.  Keep all follow-up visits as directed by your health care provider. This is important. Your health care provider may need to monitor your condition. SEEK MEDICAL CARE IF:  You have swelling, redness, and pain in the affected area.  Your bowel habits change. SEEK IMMEDIATE MEDICAL CARE IF:  You have a fever.  You have abdominal pain that is getting worse.  You feel nauseous or you vomit.  You cannot push the hernia back in place by gently pressing on it while you are lying down.  The hernia:  Changes in shape or size.  Is stuck outside the abdomen.  Becomes discolored.  Feels hard or tender.   This information is not intended to replace advice given to you by your health care provider. Make sure you discuss any questions you have with your health care provider.   Document Released: 11/29/2005 Document Revised: 12/20/2014 Document Reviewed: 10/09/2014 Elsevier Interactive Patient Education [employer].

## 2016-06-11 ENCOUNTER — Other Ambulatory Visit: Payer: Self-pay | Admitting: Family Medicine

## 2016-06-24 ENCOUNTER — Other Ambulatory Visit: Payer: Self-pay | Admitting: Physician Assistant

## 2016-06-24 DIAGNOSIS — D0471 Carcinoma in situ of skin of right lower limb, including hip: Secondary | ICD-10-CM | POA: Diagnosis not present

## 2016-06-24 DIAGNOSIS — D044 Carcinoma in situ of skin of scalp and neck: Secondary | ICD-10-CM | POA: Diagnosis not present

## 2016-06-24 DIAGNOSIS — C44722 Squamous cell carcinoma of skin of right lower limb, including hip: Secondary | ICD-10-CM | POA: Diagnosis not present

## 2016-06-28 ENCOUNTER — Encounter: Payer: Self-pay | Admitting: Family Medicine

## 2016-06-30 DIAGNOSIS — E119 Type 2 diabetes mellitus without complications: Secondary | ICD-10-CM | POA: Diagnosis not present

## 2016-06-30 DIAGNOSIS — H25813 Combined forms of age-related cataract, bilateral: Secondary | ICD-10-CM | POA: Diagnosis not present

## 2016-07-19 DIAGNOSIS — H2512 Age-related nuclear cataract, left eye: Secondary | ICD-10-CM | POA: Diagnosis not present

## 2016-07-21 ENCOUNTER — Encounter: Payer: Self-pay | Admitting: Podiatry

## 2016-07-21 ENCOUNTER — Ambulatory Visit (INDEPENDENT_AMBULATORY_CARE_PROVIDER_SITE_OTHER): Payer: Medicare Other | Admitting: Podiatry

## 2016-07-21 DIAGNOSIS — E1142 Type 2 diabetes mellitus with diabetic polyneuropathy: Secondary | ICD-10-CM | POA: Diagnosis not present

## 2016-07-21 DIAGNOSIS — B351 Tinea unguium: Secondary | ICD-10-CM

## 2016-07-21 DIAGNOSIS — M79676 Pain in unspecified toe(s): Secondary | ICD-10-CM | POA: Diagnosis not present

## 2016-07-21 NOTE — Patient Instructions (Signed)
Diabetes and Foot Care Diabetes may cause you to have problems because of poor blood supply (circulation) to your feet and legs. This may cause the skin on your feet to become thinner, break easier, and heal more slowly. Your skin may become dry, and the skin may peel and crack. You may also have nerve damage in your legs and feet causing decreased feeling in them. You may not notice minor injuries to your feet that could lead to infections or more serious problems. Taking care of your feet is one of the most important things you can do for yourself.  HOME CARE INSTRUCTIONS  Wear shoes at all times, even in the house. Do not go barefoot. Bare feet are easily injured.  Check your feet daily for blisters, cuts, and redness. If you cannot see the bottom of your feet, use a mirror or ask someone for help.  Wash your feet with warm water (do not use hot water) and mild soap. Then pat your feet and the areas between your toes until they are completely dry. Do not soak your feet as this can dry your skin.  Apply a moisturizing lotion or petroleum jelly (that does not contain alcohol and is unscented) to the skin on your feet and to dry, brittle toenails. Do not apply lotion between your toes.  Trim your toenails straight across. Do not dig under them or around the cuticle. File the edges of your nails with an emery board or nail file.  Do not cut corns or calluses or try to remove them with medicine.  Wear clean socks or stockings every day. Make sure they are not too tight. Do not wear knee-high stockings since they may decrease blood flow to your legs.  Wear shoes that fit properly and have enough cushioning. To break in new shoes, wear them for just a few hours a day. This prevents you from injuring your feet. Always look in your shoes before you put them on to be sure there are no objects inside.  Do not cross your legs. This may decrease the blood flow to your feet.  If you find a minor scrape,  cut, or break in the skin on your feet, keep it and the skin around it clean and dry. These areas may be cleansed with mild soap and water. Do not cleanse the area with peroxide, alcohol, or iodine.  When you remove an adhesive bandage, be sure not to damage the skin around it.  If you have a wound, look at it several times a day to make sure it is healing.  Do not use heating pads or hot water bottles. They may burn your skin. If you have lost feeling in your feet or legs, you may not know it is happening until it is too late.  Make sure your health care provider performs a complete foot exam at least annually or more often if you have foot problems. Report any cuts, sores, or bruises to your health care provider immediately. SEEK MEDICAL CARE IF:   You have an injury that is not healing.  You have cuts or breaks in the skin.  You have an ingrown nail.  You notice redness on your legs or feet.  You feel burning or tingling in your legs or feet.  You have pain or cramps in your legs and feet.  Your legs or feet are numb.  Your feet always feel cold. SEEK IMMEDIATE MEDICAL CARE IF:   There is increasing redness,   swelling, or pain in or around a wound.  There is a red line that goes up your leg.  Pus is coming from a wound.  You develop a fever or as directed by your health care provider.  You notice a bad smell coming from an ulcer or wound.   This information is not intended to replace advice given to you by your health care provider. Make sure you discuss any questions you have with your health care provider.   Document Released: 11/26/2000 Document Revised: 08/01/2013 Document Reviewed: 05/08/2013 Elsevier Interactive Patient Education 2016 [employer]  

## 2016-07-21 NOTE — Progress Notes (Signed)
Patient ID: [name], male   DOB: [birth year], 82 y.o.   MRN: [id number]    Subjective: This patient presents for scheduled visit complaining of thickened and elongated toenails 6-10 and requesting nail debridement. The patient's caregiver is present in the treatment room today.  Objective: No open skin lesions bilaterally The toenails are hypertrophic, elongated, deformed, discolored 6-10  Assessment: Mycotic toenails 6-10 Diabetic with peripheral neuropathy  Plan: Debridement toenails 6-10 mechanically and electrically without any bleeding

## 2016-07-26 DIAGNOSIS — H25812 Combined forms of age-related cataract, left eye: Secondary | ICD-10-CM | POA: Diagnosis not present

## 2016-07-26 DIAGNOSIS — H2512 Age-related nuclear cataract, left eye: Secondary | ICD-10-CM | POA: Diagnosis not present

## 2016-07-28 ENCOUNTER — Other Ambulatory Visit: Payer: Self-pay | Admitting: Emergency Medicine

## 2016-08-03 DIAGNOSIS — H2511 Age-related nuclear cataract, right eye: Secondary | ICD-10-CM | POA: Diagnosis not present

## 2016-09-04 ENCOUNTER — Other Ambulatory Visit: Payer: Self-pay | Admitting: Family Medicine

## 2016-09-13 DIAGNOSIS — H25811 Combined forms of age-related cataract, right eye: Secondary | ICD-10-CM | POA: Diagnosis not present

## 2016-09-13 DIAGNOSIS — H2511 Age-related nuclear cataract, right eye: Secondary | ICD-10-CM | POA: Diagnosis not present

## 2016-09-28 ENCOUNTER — Other Ambulatory Visit: Payer: Self-pay | Admitting: Physician Assistant

## 2016-09-28 DIAGNOSIS — L820 Inflamed seborrheic keratosis: Secondary | ICD-10-CM | POA: Diagnosis not present

## 2016-09-28 DIAGNOSIS — L570 Actinic keratosis: Secondary | ICD-10-CM | POA: Diagnosis not present

## 2016-09-28 DIAGNOSIS — D485 Neoplasm of uncertain behavior of skin: Secondary | ICD-10-CM | POA: Diagnosis not present

## 2016-09-28 DIAGNOSIS — C44519 Basal cell carcinoma of skin of other part of trunk: Secondary | ICD-10-CM | POA: Diagnosis not present

## 2016-10-05 ENCOUNTER — Encounter: Payer: Self-pay | Admitting: Family Medicine

## 2016-10-05 ENCOUNTER — Ambulatory Visit (INDEPENDENT_AMBULATORY_CARE_PROVIDER_SITE_OTHER): Payer: Medicare Other | Admitting: Family Medicine

## 2016-10-05 VITALS — BP 116/68 | HR 67 | Temp 97.8°F | Resp 18 | Ht 75.0 in | Wt 224.0 lb

## 2016-10-05 DIAGNOSIS — R350 Frequency of micturition: Secondary | ICD-10-CM

## 2016-10-05 DIAGNOSIS — E7800 Pure hypercholesterolemia, unspecified: Secondary | ICD-10-CM

## 2016-10-05 DIAGNOSIS — E1122 Type 2 diabetes mellitus with diabetic chronic kidney disease: Secondary | ICD-10-CM | POA: Diagnosis not present

## 2016-10-05 DIAGNOSIS — N183 Chronic kidney disease, stage 3 unspecified: Secondary | ICD-10-CM

## 2016-10-05 DIAGNOSIS — I2510 Atherosclerotic heart disease of native coronary artery without angina pectoris: Secondary | ICD-10-CM

## 2016-10-05 DIAGNOSIS — I255 Ischemic cardiomyopathy: Secondary | ICD-10-CM | POA: Diagnosis not present

## 2016-10-05 DIAGNOSIS — Z794 Long term (current) use of insulin: Secondary | ICD-10-CM | POA: Diagnosis not present

## 2016-10-05 DIAGNOSIS — N401 Enlarged prostate with lower urinary tract symptoms: Secondary | ICD-10-CM | POA: Diagnosis not present

## 2016-10-05 DIAGNOSIS — R569 Unspecified convulsions: Secondary | ICD-10-CM | POA: Diagnosis not present

## 2016-10-05 DIAGNOSIS — I10 Essential (primary) hypertension: Secondary | ICD-10-CM

## 2016-10-05 LAB — COMPREHENSIVE METABOLIC PANEL
ALK PHOS: 86 U/L (ref 40–115)
ALT: 12 U/L (ref 9–46)
AST: 16 U/L (ref 10–35)
Albumin: 4.3 g/dL (ref 3.6–5.1)
BILIRUBIN TOTAL: 0.3 mg/dL (ref 0.2–1.2)
BUN: 21 mg/dL (ref 7–25)
CALCIUM: 9 mg/dL (ref 8.6–10.3)
CO2: 27 mmol/L (ref 20–31)
CREATININE: 1.56 mg/dL — AB (ref 0.70–1.11)
Chloride: 100 mmol/L (ref 98–110)
GLUCOSE: 170 mg/dL — AB (ref 65–99)
Potassium: 5.4 mmol/L — ABNORMAL HIGH (ref 3.5–5.3)
SODIUM: 137 mmol/L (ref 135–146)
Total Protein: 7.1 g/dL (ref 6.1–8.1)

## 2016-10-05 LAB — CBC WITH DIFFERENTIAL/PLATELET
BASOS PCT: 1 %
Basophils Absolute: 78 cells/uL (ref 0–200)
EOS PCT: 1 %
Eosinophils Absolute: 78 cells/uL (ref 15–500)
HEMATOCRIT: 37.2 % — AB (ref 38.5–50.0)
HEMOGLOBIN: 12 g/dL — AB (ref 13.2–17.1)
LYMPHS ABS: 2262 {cells}/uL (ref 850–3900)
LYMPHS PCT: 29 %
MCH: 26.4 pg — ABNORMAL LOW (ref 27.0–33.0)
MCHC: 32.3 g/dL (ref 32.0–36.0)
MCV: 81.8 fL (ref 80.0–100.0)
MONO ABS: 546 {cells}/uL (ref 200–950)
MPV: 10.2 fL (ref 7.5–12.5)
Monocytes Relative: 7 %
NEUTROS PCT: 62 %
Neutro Abs: 4836 cells/uL (ref 1500–7800)
Platelets: 258 10*3/uL (ref 140–400)
RBC: 4.55 MIL/uL (ref 4.20–5.80)
RDW: 14.5 % (ref 11.0–15.0)
WBC: 7.8 10*3/uL (ref 3.8–10.8)

## 2016-10-05 LAB — LIPID PANEL
CHOL/HDL RATIO: 3.8 ratio (ref <=5.0)
Cholesterol: 133 mg/dL (ref 125–200)
HDL: 35 mg/dL — ABNORMAL LOW (ref >=40)
LDL CALC: 65 mg/dL (ref <130)
Triglycerides: 165 mg/dL — ABNORMAL HIGH (ref <150)
VLDL: 33 mg/dL — AB (ref <30)

## 2016-10-05 MED ORDER — INSULIN REGULAR HUMAN 100 UNIT/ML IJ SOLN: 10.0000 [IU] | Freq: Two times a day (BID) | INTRAMUSCULAR | 11 refills | Status: DC

## 2016-10-05 NOTE — Progress Notes (Signed)
Subjective:    Patient ID: [name], male    DOB: 18-Mar-1932, 82 y.o.   MRN: [id number]  10/05/2016  Follow-up (4 month follow up)   HPI This 82 y.o. male presents for four month follow-up of DMII, HTN, BPH, Dementia, seizure disorder, Adenocarcinoma sigmoid in 2004, leukemia.  Just ran out of insulin this month.  Sugars running in 200s; this morning 206.  Eating a lot of donuts; ate one donut yesterday.  Dietary choices are poor.  Has a history of anemia.   Fasting sugars 200-228 in am.  At night, runs 225-325.  Checking sugars at night before supper; eating twinkies and dingdongs at lunch; eating 1/2 sandwich.  Eating pizza, hot dogs, chips.  Eats a lot of fruit every morning. Likes potatoes, tomatoes.  Five dogs.  Lab.  Followed by Dr. Melton Alar every 3 months for seizure activity; last seizure two months ago.  Podiatry 07/21/2016; cuts toenails.  Grows like claws. S/p evaluation by Dr. Claiborne Billings; s/p cryotherapy.    Had flu vaccine 10/01/2016 at [facility].    Wt Readings from Last 3 Encounters:  10/13/16 223 lb 12.8 oz (101.5 kg)  10/05/16 224 lb (101.6 kg)  06/08/16 215 lb 3.2 oz (97.6 kg)   BP Readings from Last 3 Encounters:  10/13/16 (!) 155/67  10/05/16 116/68  06/08/16 122/76    Immunization History  Administered Date(s) Administered  . Influenza Split 10/31/2012  . Influenza,inj,Quad PF,36+ Mos 10/23/2013, 08/14/2014, 08/14/2015  . Influenza-Unspecified 10/01/2016  . Pneumococcal Conjugate-13 07/02/2014  . Pneumococcal Polysaccharide-23 12/10/2003, 06/08/2016  . Tdap 05/20/2015    Review of Systems  Constitutional: Negative for activity change, appetite change, chills, diaphoresis, fatigue and fever.  Respiratory: Negative for cough and shortness of breath.   Cardiovascular: Negative for chest pain, palpitations and leg swelling.  Gastrointestinal: Negative for abdominal pain, diarrhea, nausea and vomiting.  Endocrine: Negative for cold intolerance, heat intolerance,  polydipsia, polyphagia and polyuria.  Skin: Negative for color change, rash and wound.  Neurological: Negative for dizziness, tremors, seizures, syncope, facial asymmetry, speech difficulty, weakness, light-headedness, numbness and headaches.  Psychiatric/Behavioral: Negative for dysphoric mood and sleep disturbance. The patient is not nervous/anxious.     Past Medical History:  Diagnosis Date  . Anemia   . Aortic root dilatation ([place])   . BPH (benign prostatic hyperplasia)    increased PSA  . CAD (coronary artery disease)    Presumed from previously mildy abnormal myoview;  myoview 4/12: EF 73%, no ischemia  . Cardiomyopathy    a. echo 5/12: EF 45-50%, mild LVH, grade 2 diast dysfxn, RAE  . Chronic kidney disease    renal insuff, increased uric acid  . Colon cancer ([place])   . Diabetes mellitus   . DM2 (diabetes mellitus, type 2) ([place])   . HLD (hyperlipidemia)   . HTN (hypertension)   . Leukopenia   . Pneumothorax   . Rectal cancer ([place])   . Seizures ([place])   . Stroke [facility])    Past Surgical History:  Procedure Laterality Date  . COLON SURGERY    . FEMUR IM NAIL Right 11/16/2015   Procedure: INTRAMEDULLARY TROCHANTERIC HIP NAILING;  Surgeon: Rod Can, MD;  Location: [place];  Service: Orthopedics;  Laterality: Right;  . HIP ARTHROPLASTY    . ILEOSTOMY  12/2010  . JOINT REPLACEMENT    . reversal ileostomy  03/2011  . S/P resection of rectal cancer    . TONSILLECTOMY    . VASECTOMY  No Known Allergies Current Outpatient Prescriptions  Medication Sig Dispense Refill  . acetaminophen (TYLENOL) 325 MG tablet Take 650 mg by mouth every 4 (four) hours as needed. Reported on 04/12/2016    . amLODipine (NORVASC) 5 MG tablet TAKE 1 TABLET BY MOUTH EVERY DAY 30 tablet 11  . aspirin 81 MG tablet Take 81 mg by mouth daily.    . B-D INS SYR MICROFINE .5CC/28G 28G X 1/2" 0.5 ML MISC USE AS DIRECTED 100 each 5  . donepezil (ARICEPT) 10 MG tablet Take 10 mg by mouth daily.  1  .  doxazosin (CARDURA) 4 MG tablet TAKE 1 TABLET BY MOUTH AT BEDTIME. 90 tablet 0  . ECHINACEA HERB PO Take 1 tablet by mouth daily.    . ferrous sulfate 325 (65 FE) MG tablet Take 325 mg by mouth 2 (two) times daily. Reported on 03/30/2016    . finasteride (PROSCAR) 5 MG tablet TAKE 1 TABLET BY MOUTH EVERY DAY 90 tablet 1  . furosemide (LASIX) 20 MG tablet TAKE 1 TABLET BY MOUTH EVERY DAY 90 tablet 1  . glucose blood (ONE TOUCH ULTRA TEST) test strip Use to test blood sugar 3 times daily. Dx E11.22 300 each 3  . lamoTRIgine (LAMICTAL) 100 MG tablet Take 100 mg by mouth 3 (three) times daily. And Lamotrigine 100 mg  3  . metoprolol succinate (TOPROL-XL) 50 MG 24 hr tablet Take 1 tablet (50 mg total) by mouth daily. TAKE 1 TABLET BY MOUTH EVERY DAY. 90 tablet 3  . Misc. Devices MISC Stair Lift.  Use as directed.  DX Code: [id number] 1 each 0  . nitroGLYCERIN (NITROSTAT) 0.4 MG SL tablet Place 1 tablet (0.4 mg total) under the tongue every 5 (five) minutes as needed for chest pain. (Patient not taking: Reported on 10/13/2016) 25 tablet 3  . Nutritional Supplements (JUICE PLUS FIBRE PO) Take 2 tablets by mouth 2 (two) times daily. Reported on 04/12/2016    . pravastatin (PRAVACHOL) 20 MG tablet Take 1 tablet (20 mg total) by mouth daily. (Patient taking differently: Take 20 mg by mouth 2 (two) times daily. ) 90 tablet 3  . sennosides-docusate sodium (SENOKOT-S) 8.6-50 MG tablet Take 2 tablets by mouth at bedtime. Reported on 03/30/2016    . insulin regular (HUMULIN R) 100 units/mL injection Inject 0.1-0.15 mLs (10-15 Units total) into the skin 2 (two) times daily before a meal. 10 mL 11   No current facility-administered medications for this visit.    Social History   Social History  . Marital status: Widowed    Spouse name: N/A  . Number of children: 2  . Years of education: N/A   Occupational History  . retired    Social History Main Topics  . Smoking status: Never Smoker  . Smokeless tobacco:  Never Used  . Alcohol use Yes  . Drug use: No  . Sexual activity: Not on file   Other Topics Concern  . Not on file   Social History Narrative   Marital status: widowed.      Children:  2 children; 2 grandchildren; no gg      Lives; with daughter; babysitter five days per week for six hours.      Employed: retired; Administrator; trucking Administrator      Alcohol: none now      Exercise:  None      ADLs: cane for ambulation; no driving in [id number];    Family  History  Problem Relation Age of Onset  . Parkinsonism Father   . Alzheimer's disease    . Parkinsonism Other   . Stomach cancer Neg Hx   . Colon cancer Neg Hx        Objective:    BP 116/68   Pulse 67   Temp 97.8 F (36.6 C) (Oral)   Resp 18   Ht 6\' 3"  (1.905 m)   Wt 224 lb (101.6 kg)   SpO2 96%   BMI 28.00 kg/m  Physical Exam  Constitutional: He is oriented to person, place, and time. He appears well-developed and well-nourished. No distress.  HENT:  Head: Normocephalic and atraumatic.  Right Ear: External ear normal.  Left Ear: External ear normal.  Nose: Nose normal.  Mouth/Throat: Oropharynx is clear and moist.  Eyes: Conjunctivae and EOM are normal. Pupils are equal, round, and reactive to light.  Neck: Normal range of motion. Neck supple. Carotid bruit is not present. No thyromegaly present.  Cardiovascular: Normal rate, regular rhythm, normal heart sounds and intact distal pulses.  Exam reveals no gallop and no friction rub.   No murmur heard. Pulmonary/Chest: Effort normal and breath sounds normal. He has no wheezes. He has no rales.  Abdominal: Soft. Bowel sounds are normal. He exhibits no distension and no mass. There is no tenderness. There is no rebound and no guarding.  Lymphadenopathy:    He has no cervical adenopathy.  Neurological: He is alert and oriented to person, place, and time. No cranial nerve deficit.  Skin: Skin is warm and dry. No rash noted. He is not diaphoretic.    Psychiatric: He has a normal mood and affect. His behavior is normal.  Nursing note and vitals reviewed.  Fall Risk  06/08/2016 06/02/2016 06/02/2016 03/30/2016 12/25/2015  Falls in the past year? Yes Yes Yes Yes Yes  Number falls in past yr: 2 or more 1 1 1  -  Injury with Fall? Yes - - Yes -  Risk Factor Category  - - - - -  Risk for fall due to : - - - - -  Risk for fall due to (comments): - - - - -   Depression screen [facility] 2/9 06/08/2016 06/02/2016 06/02/2016 12/25/2015 05/20/2015  Decreased Interest 0 0 0 0 0  Down, Depressed, Hopeless 0 0 0 0 0  PHQ - 2 Score 0 0 0 0 0  Some recent data might be hidden        Assessment & Plan:   1. Type 2 diabetes mellitus with stage 3 chronic kidney disease, with long-term current use of insulin ([place])   2. Essential hypertension   3. Atherosclerosis of native coronary artery of native heart without angina pectoris   4. Ischemic cardiomyopathy   5. Benign prostatic hyperplasia with urinary frequency   6. Stage 3 chronic kidney disease   7. Pure hypercholesterolemia   8. Seizures ([place])    -stable; obtain labs; refills provided. -good support from family.   Orders Placed This Encounter  Procedures  . CBC with Differential/Platelet  . Comprehensive metabolic panel  . Hemoglobin A1c  . Lipid panel    Order Specific Question:   Has the patient fasted?    Answer:   Yes   Meds ordered this encounter  Medications  . insulin regular (HUMULIN R) 100 units/mL injection    Sig: Inject 0.1-0.15 mLs (10-15 Units total) into the skin 2 (two) times daily before a meal.    Dispense:  10 mL  Refill:  11    Return in about 3 months (around 01/05/2017) for complete physical examiniation.   Maydell Knoebel Elayne Guerin, M.D. [facility] [address] [place], [place]  [address] [phone] phone [phone] fax

## 2016-10-06 LAB — HEMOGLOBIN A1C
Hgb A1c MFr Bld: 7.7 % — ABNORMAL HIGH (ref <5.7)
Mean Plasma Glucose: 174 mg/dL

## 2016-10-11 ENCOUNTER — Other Ambulatory Visit: Payer: Medicare Other

## 2016-10-13 ENCOUNTER — Encounter: Payer: Self-pay | Admitting: Hematology

## 2016-10-13 ENCOUNTER — Other Ambulatory Visit (HOSPITAL_BASED_OUTPATIENT_CLINIC_OR_DEPARTMENT_OTHER): Payer: Medicare Other

## 2016-10-13 ENCOUNTER — Telehealth: Payer: Self-pay | Admitting: Hematology

## 2016-10-13 ENCOUNTER — Ambulatory Visit (HOSPITAL_BASED_OUTPATIENT_CLINIC_OR_DEPARTMENT_OTHER): Payer: Medicare Other | Admitting: Hematology

## 2016-10-13 VITALS — BP 155/67 | HR 72 | Temp 97.8°F | Resp 18 | Ht 75.0 in | Wt 223.8 lb

## 2016-10-13 DIAGNOSIS — Z85038 Personal history of other malignant neoplasm of large intestine: Secondary | ICD-10-CM | POA: Diagnosis not present

## 2016-10-13 DIAGNOSIS — D708 Other neutropenia: Secondary | ICD-10-CM | POA: Diagnosis not present

## 2016-10-13 DIAGNOSIS — E119 Type 2 diabetes mellitus without complications: Secondary | ICD-10-CM

## 2016-10-13 DIAGNOSIS — N183 Chronic kidney disease, stage 3 unspecified: Secondary | ICD-10-CM

## 2016-10-13 DIAGNOSIS — C187 Malignant neoplasm of sigmoid colon: Secondary | ICD-10-CM

## 2016-10-13 DIAGNOSIS — D649 Anemia, unspecified: Secondary | ICD-10-CM

## 2016-10-13 DIAGNOSIS — I2510 Atherosclerotic heart disease of native coronary artery without angina pectoris: Secondary | ICD-10-CM

## 2016-10-13 DIAGNOSIS — I10 Essential (primary) hypertension: Secondary | ICD-10-CM

## 2016-10-13 DIAGNOSIS — D638 Anemia in other chronic diseases classified elsewhere: Secondary | ICD-10-CM

## 2016-10-13 LAB — CBC WITH DIFFERENTIAL/PLATELET
BASO%: 0.5 % (ref 0.0–2.0)
BASOS ABS: 0 10*3/uL (ref 0.0–0.1)
EOS ABS: 0.1 10*3/uL (ref 0.0–0.5)
EOS%: 1.4 % (ref 0.0–7.0)
HEMATOCRIT: 37.1 % — AB (ref 38.4–49.9)
HEMOGLOBIN: 11.8 g/dL — AB (ref 13.0–17.1)
LYMPH#: 2.2 10*3/uL (ref 0.9–3.3)
LYMPH%: 26.4 % (ref 14.0–49.0)
MCH: 26 pg — AB (ref 27.2–33.4)
MCHC: 31.9 g/dL — ABNORMAL LOW (ref 32.0–36.0)
MCV: 81.6 fL (ref 79.3–98.0)
MONO#: 0.5 10*3/uL (ref 0.1–0.9)
MONO%: 6.2 % (ref 0.0–14.0)
NEUT#: 5.5 10*3/uL (ref 1.5–6.5)
NEUT%: 65.5 % (ref 39.0–75.0)
Platelets: 235 10*3/uL (ref 140–400)
RBC: 4.54 10*6/uL (ref 4.20–5.82)
RDW: 14.9 % — AB (ref 11.0–14.6)
WBC: 8.4 10*3/uL (ref 4.0–10.3)

## 2016-10-13 LAB — COMPREHENSIVE METABOLIC PANEL
ALBUMIN: 3.8 g/dL (ref 3.5–5.0)
ALK PHOS: 100 U/L (ref 40–150)
ALT: 11 U/L (ref 0–55)
AST: 14 U/L (ref 5–34)
Anion Gap: 8 mEq/L (ref 3–11)
BILIRUBIN TOTAL: 0.42 mg/dL (ref 0.20–1.20)
BUN: 22.5 mg/dL (ref 7.0–26.0)
CALCIUM: 9 mg/dL (ref 8.4–10.4)
CO2: 27 mEq/L (ref 22–29)
Chloride: 102 mEq/L (ref 98–109)
Creatinine: 1.8 mg/dL — ABNORMAL HIGH (ref 0.7–1.3)
EGFR: 35 mL/min/{1.73_m2} — AB (ref >90)
Glucose: 191 mg/dl — ABNORMAL HIGH (ref 70–140)
POTASSIUM: 5.6 meq/L — AB (ref 3.5–5.1)
Sodium: 137 mEq/L (ref 136–145)
Total Protein: 7.1 g/dL (ref 6.4–8.3)

## 2016-10-13 NOTE — Telephone Encounter (Signed)
Appointments scheduled per 11/1 LOS. Patient given AVS report and calendars with future scheduled appointments.  °

## 2016-10-13 NOTE — Progress Notes (Signed)
[facility] OFFICE PROGRESS NOTE  SMITH,KRISTI, MD [place] [id number]  DIAGNOSIS: Neutropenia, Anemia    CURRENT THERAPY:  Observation   PREVIOUS THERAPY:  1. Neupogen 300 mcg subcu,  started in late December 2011, he was on 3-4 times weekly initially, and reduced to once weekly (Tuesday) since 12/13/2014, stopped on 03/19/2015 2. Cyclosporine 125 mg twice daily. Cyclosporine was started in August 2012, and stopped on 05/14/2015   INTERVAL HISTORY: [name] 82 y.o. male with a history of autoimmune neutropenia is here for follow-up.  He presents to the clinic by himself today. He is doing well overall, denies any significant pain, no episodes of infection, fever lately. He has been seen his primary care physician Dr. Tamala Julian every 3 months, his blood pressure and glucose has been controlled overall. No other complaints.  MEDICAL HISTORY: Past Medical History:  Diagnosis Date  . Anemia   . Aortic root dilatation ([place])   . BPH (benign prostatic hyperplasia)    increased PSA  . CAD (coronary artery disease)    Presumed from previously mildy abnormal myoview;  myoview 4/12: EF 73%, no ischemia  . Cardiomyopathy    a. echo 5/12: EF 45-50%, mild LVH, grade 2 diast dysfxn, RAE  . Chronic kidney disease    renal insuff, increased uric acid  . Colon cancer ([place])   . Diabetes mellitus   . DM2 (diabetes mellitus, type 2) ([place])   . HLD (hyperlipidemia)   . HTN (hypertension)   . Leukopenia   . Pneumothorax   . Rectal cancer ([place])   . Seizures ([place])   . Stroke [facility])      ALLERGIES:  has No Known Allergies.  MEDICATIONS: has a current medication list which includes the following prescription(s): acetaminophen, amlodipine, aspirin, b-d ins syr microfine .5cc/28g, donepezil, doxazosin, echinacea, ferrous sulfate, finasteride, furosemide, glucose blood, insulin regular, lamotrigine, metoprolol succinate, misc. devices, nutritional supplements, pravastatin,  sennosides-docusate sodium, and nitroglycerin.  SURGICAL HISTORY:  Past Surgical History:  Procedure Laterality Date  . COLON SURGERY    . FEMUR IM NAIL Right 11/16/2015   Procedure: INTRAMEDULLARY TROCHANTERIC HIP NAILING;  Surgeon: Rod Can, MD;  Location: [place];  Service: Orthopedics;  Laterality: Right;  . HIP ARTHROPLASTY    . ILEOSTOMY  12/2010  . JOINT REPLACEMENT    . reversal ileostomy  03/2011  . S/P resection of rectal cancer    . TONSILLECTOMY    . VASECTOMY      REVIEW OF SYSTEMS:   Constitutional: Denies fevers, chills or abnormal weight loss Eyes: Denies blurriness of vision Ears, nose, mouth, throat, and face: Denies mucositis or sore throat Respiratory: Denies cough, dyspnea or wheezes Cardiovascular: Denies palpitation, chest discomfort or lower extremity swelling Gastrointestinal:  Denies nausea, heartburn or change in bowel habits Skin: Denies abnormal skin rashes Lymphatics: Denies new lymphadenopathy or easy bruising Neurological:Denies numbness, tingling or new weaknesses Behavioral/Psych: Mood is stable, no new changes  All other systems were reviewed with the patient and are negative.  PHYSICAL EXAMINATION: ECOG PERFORMANCE STATUS: 1  Blood pressure (!) 155/67, pulse 72, temperature 97.8 F (36.6 C), temperature source Oral, resp. rate 18, height 6\' 3"  (1.905 m), weight 223 lb 12.8 oz (101.5 kg), SpO2 96 %.  GENERAL:alert, no distress and comfortable; chronically ill appearing.  SKIN: skin color, texture, turgor are normal, no rashes;  Chronic scaling of skin.  EYES: normal, Conjunctiva are pink and non-injected, sclera clear OROPHARYNX:no exudate, no erythema and lips,  buccal mucosa, and tongue normal  NECK: supple, thyroid normal size, non-tender, without nodularity LYMPH:  no palpable lymphadenopathy in the cervical, axillary or supraclavicular LUNGS: clear to auscultation and percussion with normal breathing effort HEART: regular rate &  rhythm and no murmurs and trace lower extremity edema bilaterally. ABDOMEN:abdomen soft, non-tender and normal bowel sounds Musculoskeletal:no cyanosis of digits and no clubbing; R arm with decreased ROM.  NEURO: alert & oriented x 3 with fluent speech, no focal motor/sensory deficits  LABORATORY DATA: CBC Latest Ref Rng & Units 10/13/2016 10/05/2016 06/02/2016  WBC 4.0 - 10.3 10e3/uL 8.4 7.8 9.2  Hemoglobin 13.0 - 17.1 g/dL 11.8(L) 12.0(L) 12.1(A)  Hematocrit 38.4 - 49.9 % 37.1(L) 37.2(L) 34.9(A)  Platelets 140 - 400 10e3/uL 235 258 -    CMP Latest Ref Rng & Units 10/13/2016 10/05/2016 06/02/2016  Glucose 70 - 140 mg/dl 191(H) 170(H) 137(H)  BUN 7.0 - 26.0 mg/dL 22.5 21 20   Creatinine 0.7 - 1.3 mg/dL 1.8(H) 1.56(H) 1.48(H)  Sodium 136 - 145 mEq/L 137 137 140  Potassium 3.5 - 5.1 mEq/L 5.6(H) 5.4(H) 5.1  Chloride 98 - 110 mmol/L - 100 103  CO2 22 - 29 mEq/L 27 27 27   Calcium 8.4 - 10.4 mg/dL 9.0 9.0 9.2  Total Protein 6.4 - 8.3 g/dL 7.1 7.1 7.1  Total Bilirubin 0.20 - 1.20 mg/dL 0.42 0.3 0.4  Alkaline Phos 40 - 150 U/L 100 86 113  AST 5 - 34 U/L 14 16 16   ALT 0 - 55 U/L 11 12 12     RADIOGRAPHIC STUDIES: No results found.  ASSESSMENT: [name] 82 y.o. male with a history of  1. Autoimmune neutropenia. - He continues to do well overall.  His WBC and ANC has remained normal since he is off Neupogen and cyclosporine in 2016. -Continue monitoring. Given his stable and normal blood counts, I'll see him on a yearly basis.  2. 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 Verl Blalock 12/22/2011 without significant finding.  -continue follow up, CEA normal in 03/2015 -No evidence of recurrence. He is 5 years out of his initial diagnosis now, minimum risk of recurrence, no need routine follow up or surveillance scan   4. Anemia.  - Likely related to mild renal insufficiency. Stable -worse anemia in 11/2015 secondary to hip fracture and  surgery, recovered very well     5. CKD stage III, dementia, HTN, DM, CAD  -Follow up with primary care physician   Follow-up.  -one year with lab   All questions were answered. The patient knows to call the clinic with any problems, questions or concerns. We can certainly see the patient much sooner if necessary.  I spent 10 minutes counseling the patient face to face. The total time spent in the appointment was 15 minutes.    Truitt Merle, MD 10/13/2016

## 2016-10-14 ENCOUNTER — Telehealth: Payer: Self-pay | Admitting: *Deleted

## 2016-10-14 NOTE — Telephone Encounter (Signed)
Spoke with pt today and informed pt re:  K+ 5.6 from labs yesterday.  Instructed pt to STOP taking Kdur for now, eat foods low in potassium, and to follow up with PCP for further evaluation.   Pt voiced understanding.

## 2016-10-18 ENCOUNTER — Ambulatory Visit: Payer: Medicare Other | Admitting: Hematology and Oncology

## 2016-10-20 ENCOUNTER — Ambulatory Visit (INDEPENDENT_AMBULATORY_CARE_PROVIDER_SITE_OTHER): Payer: Medicare Other | Admitting: Podiatry

## 2016-10-20 ENCOUNTER — Encounter: Payer: Self-pay | Admitting: Podiatry

## 2016-10-20 VITALS — BP 150/72 | HR 65 | Resp 18

## 2016-10-20 DIAGNOSIS — B351 Tinea unguium: Secondary | ICD-10-CM | POA: Diagnosis not present

## 2016-10-20 DIAGNOSIS — E1142 Type 2 diabetes mellitus with diabetic polyneuropathy: Secondary | ICD-10-CM

## 2016-10-20 NOTE — Patient Instructions (Signed)
Diabetes and Foot Care Diabetes may cause you to have problems because of poor blood supply (circulation) to your feet and legs. This may cause the skin on your feet to become thinner, break easier, and heal more slowly. Your skin may become dry, and the skin may peel and crack. You may also have nerve damage in your legs and feet causing decreased feeling in them. You may not notice minor injuries to your feet that could lead to infections or more serious problems. Taking care of your feet is one of the most important things you can do for yourself.  HOME CARE INSTRUCTIONS  Wear shoes at all times, even in the house. Do not go barefoot. Bare feet are easily injured.  Check your feet daily for blisters, cuts, and redness. If you cannot see the bottom of your feet, use a mirror or ask someone for help.  Wash your feet with warm water (do not use hot water) and mild soap. Then pat your feet and the areas between your toes until they are completely dry. Do not soak your feet as this can dry your skin.  Apply a moisturizing lotion or petroleum jelly (that does not contain alcohol and is unscented) to the skin on your feet and to dry, brittle toenails. Do not apply lotion between your toes.  Trim your toenails straight across. Do not dig under them or around the cuticle. File the edges of your nails with an emery board or nail file.  Do not cut corns or calluses or try to remove them with medicine.  Wear clean socks or stockings every day. Make sure they are not too tight. Do not wear knee-high stockings since they may decrease blood flow to your legs.  Wear shoes that fit properly and have enough cushioning. To break in new shoes, wear them for just a few hours a day. This prevents you from injuring your feet. Always look in your shoes before you put them on to be sure there are no objects inside.  Do not cross your legs. This may decrease the blood flow to your feet.  If you find a minor scrape,  cut, or break in the skin on your feet, keep it and the skin around it clean and dry. These areas may be cleansed with mild soap and water. Do not cleanse the area with peroxide, alcohol, or iodine.  When you remove an adhesive bandage, be sure not to damage the skin around it.  If you have a wound, look at it several times a day to make sure it is healing.  Do not use heating pads or hot water bottles. They may burn your skin. If you have lost feeling in your feet or legs, you may not know it is happening until it is too late.  Make sure your health care provider performs a complete foot exam at least annually or more often if you have foot problems. Report any cuts, sores, or bruises to your health care provider immediately. SEEK MEDICAL CARE IF:   You have an injury that is not healing.  You have cuts or breaks in the skin.  You have an ingrown nail.  You notice redness on your legs or feet.  You feel burning or tingling in your legs or feet.  You have pain or cramps in your legs and feet.  Your legs or feet are numb.  Your feet always feel cold. SEEK IMMEDIATE MEDICAL CARE IF:   There is increasing redness,   swelling, or pain in or around a wound.  There is a red line that goes up your leg.  Pus is coming from a wound.  You develop a fever or as directed by your health care provider.  You notice a bad smell coming from an ulcer or wound.   This information is not intended to replace advice given to you by your health care provider. Make sure you discuss any questions you have with your health care provider.   Document Released: 11/26/2000 Document Revised: 08/01/2013 Document Reviewed: 05/08/2013 Elsevier Interactive Patient Education 2016 [employer]  

## 2016-10-20 NOTE — Progress Notes (Signed)
Patient ID: [name], male   DOB: 04-24-1932, 82 y.o.   MRN: [id number]    Subjective: This patient presents for scheduled visit complaining of thickened and elongated toenails 6-10 and requesting nail debridement. The patient's caregiver is present in the treatment room today.  Objective: Patient appears responsive and able answer questioning and appears orientated 3 DP pulses 1/4 bilaterally PT pulses 2/4 bilaterally Capillary reflex immediate bilaterally Sensation to 10 g monofilament wire intact 1/5 right 0/5 left Vibratory sensation nonreactive bilaterally Ankle reflex equal and reactive bilaterally Hammertoe second and third bilaterally Hammertoe 2-3 bilaterally Manual motor testing dorsi flexion, plantar flexion, inversion, eversion 5/5 bilaterally Patient walks slowly with a cane No open skin lesions bilaterally The toenails are hypertrophic, elongated, deformed, discolored 6-10  Assessment: Mycotic toenails 6-10 Diabetic with peripheral neuropathy  Plan: Debridement toenails 6-10 mechanically and electrically without any bleeding  Reappoint 3 months

## 2016-10-21 DIAGNOSIS — G301 Alzheimer's disease with late onset: Secondary | ICD-10-CM | POA: Diagnosis not present

## 2016-11-26 ENCOUNTER — Telehealth: Payer: Self-pay

## 2016-11-26 NOTE — Telephone Encounter (Signed)
Patients daughter [name] is calling because the patient's physical therapist not longer wants to treat him. They want him to continue using the gym with no supervision. [name] wants to know if we're can refer the patient somewhere else. Please advise!  [name]: [phone]

## 2016-11-29 NOTE — Telephone Encounter (Signed)
Daughter states all he needs is an referral to continue PT. She wants to know if her father the patient can have another referral to an PT to avoid going to a regular gym.

## 2016-11-30 NOTE — Telephone Encounter (Signed)
Smith Please write referral if apprpriate

## 2016-12-02 NOTE — Telephone Encounter (Signed)
L/m with dr smiths note 

## 2016-12-02 NOTE — Telephone Encounter (Signed)
Call daughter/patient ---- looks like physical therapy referral originated from 08/2014.  Please confirm this is correct. Has another provider/specialist referred patient for physical therapy other than Dr. Everlene Farrier?  If no, patient has likely reached maximum benefit from two years of physical therapy.  If another specialist has referred pt to physical therapy recently (within the past six months), I recommend pt contact that specialist regarding extension of physical therapy services.

## 2016-12-22 ENCOUNTER — Other Ambulatory Visit: Payer: Self-pay | Admitting: Family Medicine

## 2016-12-23 ENCOUNTER — Other Ambulatory Visit: Payer: Self-pay | Admitting: Physician Assistant

## 2016-12-23 DIAGNOSIS — C44612 Basal cell carcinoma of skin of right upper limb, including shoulder: Secondary | ICD-10-CM | POA: Diagnosis not present

## 2016-12-23 DIAGNOSIS — D492 Neoplasm of unspecified behavior of bone, soft tissue, and skin: Secondary | ICD-10-CM | POA: Diagnosis not present

## 2016-12-23 DIAGNOSIS — L570 Actinic keratosis: Secondary | ICD-10-CM | POA: Diagnosis not present

## 2016-12-23 DIAGNOSIS — L820 Inflamed seborrheic keratosis: Secondary | ICD-10-CM | POA: Diagnosis not present

## 2016-12-23 DIAGNOSIS — C44319 Basal cell carcinoma of skin of other parts of face: Secondary | ICD-10-CM | POA: Diagnosis not present

## 2016-12-23 DIAGNOSIS — D0461 Carcinoma in situ of skin of right upper limb, including shoulder: Secondary | ICD-10-CM | POA: Diagnosis not present

## 2017-01-11 ENCOUNTER — Encounter: Payer: Self-pay | Admitting: Family Medicine

## 2017-01-11 ENCOUNTER — Ambulatory Visit (INDEPENDENT_AMBULATORY_CARE_PROVIDER_SITE_OTHER): Payer: Medicare Other | Admitting: Family Medicine

## 2017-01-11 VITALS — BP 142/63 | HR 62 | Temp 97.6°F | Resp 18 | Ht 75.0 in | Wt 223.0 lb

## 2017-01-11 DIAGNOSIS — I255 Ischemic cardiomyopathy: Secondary | ICD-10-CM | POA: Diagnosis not present

## 2017-01-11 DIAGNOSIS — I10 Essential (primary) hypertension: Secondary | ICD-10-CM

## 2017-01-11 DIAGNOSIS — N183 Chronic kidney disease, stage 3 unspecified: Secondary | ICD-10-CM

## 2017-01-11 DIAGNOSIS — N401 Enlarged prostate with lower urinary tract symptoms: Secondary | ICD-10-CM

## 2017-01-11 DIAGNOSIS — E7800 Pure hypercholesterolemia, unspecified: Secondary | ICD-10-CM

## 2017-01-11 DIAGNOSIS — Z0000 Encounter for general adult medical examination without abnormal findings: Secondary | ICD-10-CM

## 2017-01-11 DIAGNOSIS — E1122 Type 2 diabetes mellitus with diabetic chronic kidney disease: Secondary | ICD-10-CM

## 2017-01-11 DIAGNOSIS — I2510 Atherosclerotic heart disease of native coronary artery without angina pectoris: Secondary | ICD-10-CM

## 2017-01-11 DIAGNOSIS — F0280 Dementia in other diseases classified elsewhere without behavioral disturbance: Secondary | ICD-10-CM

## 2017-01-11 DIAGNOSIS — R350 Frequency of micturition: Secondary | ICD-10-CM | POA: Diagnosis not present

## 2017-01-11 DIAGNOSIS — G300 Alzheimer's disease with early onset: Secondary | ICD-10-CM

## 2017-01-11 DIAGNOSIS — Z794 Long term (current) use of insulin: Secondary | ICD-10-CM

## 2017-01-11 DIAGNOSIS — Z9049 Acquired absence of other specified parts of digestive tract: Secondary | ICD-10-CM

## 2017-01-11 DIAGNOSIS — Z85048 Personal history of other malignant neoplasm of rectum, rectosigmoid junction, and anus: Secondary | ICD-10-CM

## 2017-01-11 DIAGNOSIS — R569 Unspecified convulsions: Secondary | ICD-10-CM

## 2017-01-11 DIAGNOSIS — D708 Other neutropenia: Secondary | ICD-10-CM

## 2017-01-11 DIAGNOSIS — D638 Anemia in other chronic diseases classified elsewhere: Secondary | ICD-10-CM

## 2017-01-11 LAB — POCT URINALYSIS DIP (MANUAL ENTRY)
BILIRUBIN UA: NEGATIVE
Glucose, UA: NEGATIVE
Ketones, POC UA: NEGATIVE
LEUKOCYTES UA: NEGATIVE
NITRITE UA: NEGATIVE
PH UA: 7
Spec Grav, UA: 1.015
UROBILINOGEN UA: 0.2

## 2017-01-11 MED ORDER — AMLODIPINE BESYLATE 5 MG PO TABS: 5.0000 mg | ORAL_TABLET | Freq: Every day | ORAL | 11 refills | Status: DC

## 2017-01-11 MED ORDER — NITROGLYCERIN 0.4 MG SL SUBL: 0.4000 mg | SUBLINGUAL_TABLET | SUBLINGUAL | 3 refills | Status: DC | PRN

## 2017-01-11 MED ORDER — GLUCOSE BLOOD VI STRP: ORAL_STRIP | 3 refills | Status: DC

## 2017-01-11 MED ORDER — FUROSEMIDE 20 MG PO TABS: 20.0000 mg | ORAL_TABLET | Freq: Every day | ORAL | 3 refills | Status: DC

## 2017-01-11 MED ORDER — FINASTERIDE 5 MG PO TABS: 5.0000 mg | ORAL_TABLET | Freq: Every day | ORAL | 3 refills | Status: DC

## 2017-01-11 MED ORDER — "INSULIN SYRINGE 28G X 1/2"" 0.5 ML MISC": 5 refills | Status: DC

## 2017-01-11 MED ORDER — METOPROLOL SUCCINATE ER 50 MG PO TB24: 50.0000 mg | ORAL_TABLET | Freq: Every day | ORAL | 3 refills | Status: DC

## 2017-01-11 MED ORDER — INSULIN REGULAR HUMAN 100 UNIT/ML IJ SOLN: 10.0000 [IU] | Freq: Two times a day (BID) | INTRAMUSCULAR | 11 refills | Status: DC

## 2017-01-11 MED ORDER — PRAVASTATIN SODIUM 20 MG PO TABS: 20.0000 mg | ORAL_TABLET | Freq: Every day | ORAL | 3 refills | Status: DC

## 2017-01-11 MED ORDER — DOXAZOSIN MESYLATE 4 MG PO TABS: ORAL_TABLET | ORAL | 3 refills | Status: DC

## 2017-01-11 NOTE — Patient Instructions (Addendum)
IF you received an x-ray today, you will receive an invoice from [facility]. Please contact [facility] at [phone] with questions or concerns regarding your invoice.   IF you received labwork today, you will receive an invoice from [facility]. Please contact [facility] at [phone] with questions or concerns regarding your invoice.   Our billing staff will not be able to assist you with questions regarding bills from these companies.  You will be contacted with the lab results as soon as they are available. The fastest way to get your results is to activate your My Chart account. Instructions are located on the last page of this paperwork. If you have not heard from [place] regarding the results in 2 weeks, please contact this office.     Advance Directive Advance directives are the legal documents that allow you to make choices about your health care and medical treatment if you cannot speak for yourself. Advance directives are a way for you to communicate your wishes to family, friends, and health care providers. The specified people can then convey your decisions about end-of-life care to avoid confusion if you should become unable to communicate. Ideally, the process of discussing and writing advance directives should happen over time rather than making decisions all at once. Advance directives can be modified as your situation changes, and you can change your mind at any time, even after you have signed the advance directives. Each state has its own laws regarding advance directives. You may want to check with your health care provider, attorney, or state representative about the law in your state. Below are some examples of advance directives. HEALTH CARE PROXY AND DURABLE POWER OF ATTORNEY FOR HEALTH CARE A health care proxy is a person (agent) appointed to make medical decisions for you if you cannot. Generally, people choose someone they know well and trust to  represent their preferences when they can no longer do so. You should be sure to ask this person for agreement to act as your agent. An agent may have to exercise judgment in the event of a medical decision for which your wishes are not known. A durable power of attorney for health care is a legal document that names your health care proxy. Depending on the laws in your state, after the document is written, it may also need to be:  Signed.  Notarized.  Dated.  Copied.  Witnessed.  Incorporated into your medical record. You may also want to appoint someone to manage your financial affairs if you cannot. This is called a durable power of attorney for finances. It is a separate legal document from the durable power of attorney for health care. You may choose the same person or someone different from your health care proxy to act as your agent in financial matters. LIVING WILL A living will is a set of instructions documenting your wishes about medical care when you cannot care for yourself. It is used if you become:  Terminally ill.  Incapacitated.  Unable to communicate.  Unable to make decisions. Items to consider in your living will include:  The use or non-use of life-sustaining equipment, such as dialysis machines and breathing machines (ventilators).  A do not resuscitate (DNR) order, which is the instruction not to use cardiopulmonary resuscitation (CPR) if breathing or heartbeat stops.  Tube feeding.  Withholding of food and fluids.  Comfort (palliative) care when the goal becomes comfort rather than a cure.  Organ and tissue donation. A living  will does not give instructions about distribution of your money and property if you should pass away. It is advisable to seek the expert advice of a lawyer in drawing up a will regarding your possessions. Decisions about taxes, beneficiaries, and asset distribution will be legally binding. This process can relieve your family and  friends of any burdens surrounding disputes or questions that may come up about the allocation of your assets. DO NOT RESUSCITATE (DNR) A do not resuscitate (DNR) order is a request to not have CPR in the event that your heart stops beating or you stop breathing. Unless given other instructions, a health care provider will try to help any patient whose heart has stopped or who has stopped breathing.  This information is not intended to replace advice given to you by your health care provider. Make sure you discuss any questions you have with your health care provider. Document Released: 03/07/2008 Document Revised: 03/22/2016 Document Reviewed: 04/18/2013 Elsevier Interactive Patient Education  2017 [employer].

## 2017-01-11 NOTE — Progress Notes (Signed)
Subjective:    Patient ID: [name], male    DOB: 1932/09/13, 83 y.o.   MRN: [id number]  01/11/2017  Annual Exam (CPE) and Medication Refill (DOXAZOSIN)   HPI This 83 y.o. male presents for Annual Wellness Examination and chronic follow-up of DMII, hypertension, BPH, dementia:.  Last physical: not sure Colonoscopy: 2013; recommend repeat in 2013 yet pt and daughter declined. Eye exam:  Cataracts 08/2016; 20/30 B.  readers Dental exam:  Every year.  Immunization History  Administered Date(s) Administered  . Influenza Split 10/31/2012  . Influenza,inj,Quad PF,36+ Mos 10/23/2013, 08/14/2014, 08/14/2015  . Influenza-Unspecified 10/01/2016  . Pneumococcal Conjugate-13 07/02/2014  . Pneumococcal Polysaccharide-23 12/10/2003, 06/08/2016  . Tdap 05/20/2015   BP Readings from Last 3 Encounters:  01/11/17 (!) 142/63  10/20/16 (!) 150/72  10/13/16 (!) 155/67   Wt Readings from Last 3 Encounters:  01/11/17 223 lb (101.2 kg)  10/13/16 223 lb 12.8 oz (101.5 kg)  10/05/16 224 lb (101.6 kg)    Has rx from [facility].  HTN: Patient reports good compliance with medication, good tolerance to medication, and good symptom control.    Hypercholesterolemia: Patient reports good compliance with medication, good tolerance to medication, and good symptom control.    DMII: Patient reports good compliance with medication, good tolerance to medication, and good symptom control.    Dementia: Patient reports good compliance with medication, good tolerance to medication, and good symptom control.  Doing well. Able to perform ADLs; daughter lives with patient.  Review of Systems  Constitutional: Negative for activity change, appetite change, chills, diaphoresis, fatigue, fever and unexpected weight change.  HENT: Negative for congestion, dental problem, drooling, ear discharge, ear pain, facial swelling, hearing loss, mouth sores, nosebleeds, postnasal drip, rhinorrhea, sinus pressure,  sneezing, sore throat, tinnitus, trouble swallowing and voice change.   Eyes: Negative for photophobia, pain, discharge, redness, itching and visual disturbance.  Respiratory: Negative for apnea, cough, choking, chest tightness, shortness of breath, wheezing and stridor.   Cardiovascular: Negative for chest pain, palpitations and leg swelling.  Gastrointestinal: Negative for abdominal pain, blood in stool, constipation, diarrhea, nausea and vomiting.  Endocrine: Negative for cold intolerance, heat intolerance, polydipsia, polyphagia and polyuria.  Genitourinary: Negative for decreased urine volume, difficulty urinating, discharge, dysuria, enuresis, flank pain, frequency, genital sores, hematuria, penile pain, penile swelling, scrotal swelling, testicular pain and urgency.       Nocturia x 3.  Urinary stream medium.  Some urinary leakage.  No depends.    Musculoskeletal: Negative for arthralgias, back pain, gait problem, joint swelling, myalgias, neck pain and neck stiffness.  Skin: Negative for color change, pallor, rash and wound.  Allergic/Immunologic: Negative for environmental allergies, food allergies and immunocompromised state.  Neurological: Negative for dizziness, tremors, seizures, syncope, facial asymmetry, speech difficulty, weakness, light-headedness, numbness and headaches.  Hematological: Negative for adenopathy. Does not bruise/bleed easily.  Psychiatric/Behavioral: Negative for agitation, behavioral problems, confusion, decreased concentration, dysphoric mood, hallucinations, self-injury, sleep disturbance and suicidal ideas. The patient is not nervous/anxious and is not hyperactive.        Bedtime 9:00pm; wakes up 7:00am.      Past Medical History:  Diagnosis Date  . Anemia   . Aortic root dilatation ([place])   . BPH (benign prostatic hyperplasia)    increased PSA  . CAD (coronary artery disease)    Presumed from previously mildy abnormal myoview;  myoview 4/12: EF 73%, no  ischemia  . Cardiomyopathy    a. echo 5/12: EF 45-50%, mild LVH,  grade 2 diast dysfxn, RAE  . Chronic kidney disease    renal insuff, increased uric acid  . Colon cancer ([place])   . Diabetes mellitus   . DM2 (diabetes mellitus, type 2) ([place])   . HLD (hyperlipidemia)   . HTN (hypertension)   . Leukopenia   . Pneumothorax   . Rectal cancer ([place])   . Seizures ([place])   . Stroke [facility])    Past Surgical History:  Procedure Laterality Date  . CATARACT EXTRACTION, BILATERAL    . COLON SURGERY    . FEMUR IM NAIL Right 11/16/2015   Procedure: INTRAMEDULLARY TROCHANTERIC HIP NAILING;  Surgeon: Rod Can, MD;  Location: [place];  Service: Orthopedics;  Laterality: Right;  . HIP ARTHROPLASTY    . ILEOSTOMY  12/2010  . JOINT REPLACEMENT    . reversal ileostomy  03/2011  . S/P resection of rectal cancer    . TONSILLECTOMY    . VASECTOMY     No Known Allergies Current Outpatient Prescriptions  Medication Sig Dispense Refill  . acetaminophen (TYLENOL) 325 MG tablet Take 650 mg by mouth every 4 (four) hours as needed. Reported on 04/12/2016    . amLODipine (NORVASC) 5 MG tablet TAKE 1 TABLET BY MOUTH EVERY DAY 30 tablet 11  . aspirin 81 MG tablet Take 81 mg by mouth daily.    . B-D INS SYR MICROFINE .5CC/28G 28G X 1/2" 0.5 ML MISC USE AS DIRECTED 100 each 5  . donepezil (ARICEPT) 10 MG tablet Take 10 mg by mouth daily.  1  . doxazosin (CARDURA) 4 MG tablet TAKE 1 TABLET BY MOUTH AT BEDTIME. 90 tablet 0  . ECHINACEA HERB PO Take 1 tablet by mouth daily.    . ferrous sulfate 325 (65 FE) MG tablet Take 325 mg by mouth 2 (two) times daily. Reported on 03/30/2016    . finasteride (PROSCAR) 5 MG tablet TAKE 1 TABLET BY MOUTH EVERY DAY 90 tablet 0  . furosemide (LASIX) 20 MG tablet TAKE 1 TABLET BY MOUTH EVERY DAY 90 tablet 1  . glucose blood (ONE TOUCH ULTRA TEST) test strip Use to test blood sugar 3 times daily. Dx E11.22 300 each 3  . insulin regular (HUMULIN R) 100 units/mL injection Inject 0.1-0.15  mLs (10-15 Units total) into the skin 2 (two) times daily before a meal. 10 mL 11  . lamoTRIgine (LAMICTAL) 100 MG tablet Take 100 mg by mouth 3 (three) times daily. And Lamotrigine 100 mg  3  . metoprolol succinate (TOPROL-XL) 50 MG 24 hr tablet Take 1 tablet (50 mg total) by mouth daily. TAKE 1 TABLET BY MOUTH EVERY DAY. 90 tablet 3  . Misc. Devices MISC Stair Lift.  Use as directed.  DX Code: A625514 1 each 0  . Nutritional Supplements (JUICE PLUS FIBRE PO) Take 2 tablets by mouth 2 (two) times daily. Reported on 04/12/2016    . pravastatin (PRAVACHOL) 20 MG tablet Take 1 tablet (20 mg total) by mouth daily. (Patient taking differently: Take 20 mg by mouth 2 (two) times daily. ) 90 tablet 3  . sennosides-docusate sodium (SENOKOT-S) 8.6-50 MG tablet Take 2 tablets by mouth at bedtime. Reported on 03/30/2016    . nitroGLYCERIN (NITROSTAT) 0.4 MG SL tablet Place 1 tablet (0.4 mg total) under the tongue every 5 (five) minutes as needed for chest pain. (Patient not taking: Reported on 10/13/2016) 25 tablet 3   No current facility-administered medications for this visit.    Social History   Social  History  . Marital status: Widowed    Spouse name: N/A  . Number of children: 2  . Years of education: N/A   Occupational History  . retired    Social History Main Topics  . Smoking status: Never Smoker  . Smokeless tobacco: Never Used  . Alcohol use Yes  . Drug use: No  . Sexual activity: Not Currently   Other Topics Concern  . Not on file   Social History Narrative   Marital status: widowed since 2014; married x 56 years.  Not dating..      Children:  2 children; 2 grandchildren; no gg      Lives; with daughter; pt has babysitter five days per week for six hours. Five dogs; expecting puppies.      Employed: retired; Administrator; trucking Administrator      Alcohol: none now      Exercise:  None      ADLs: cane for ambulation; no driving in [id number]; no cooking; daughter does  grocery shopping; daughter does finances.     Advanced Directives:  Not sure if has a living will.  FULL CODE; no prolonged measures.HCPOA: [name]; has a son.    Family History  Problem Relation Age of Onset  . Parkinsonism Father   . Alzheimer's disease    . Parkinsonism Other   . Stomach cancer Neg Hx   . Colon cancer Neg Hx        Objective:    BP (!) 142/63 (BP Location: Right Arm, Patient Position: Sitting, Cuff Size: Small)   Pulse 62   Temp 97.6 F (36.4 C) (Oral)   Resp 18   Ht 6\' 3"  (1.905 m)   Wt 223 lb (101.2 kg)   SpO2 96%   BMI 27.87 kg/m  Physical Exam  Constitutional: He is oriented to person, place, and time. He appears well-developed and well-nourished. No distress.  HENT:  Head: Normocephalic and atraumatic.  Right Ear: External ear normal.  Left Ear: External ear normal.  Nose: Nose normal.  Mouth/Throat: Oropharynx is clear and moist.  Eyes: Conjunctivae and EOM are normal. Pupils are equal, round, and reactive to light.  Neck: Normal range of motion. Neck supple. Carotid bruit is not present. No thyromegaly present.  Cardiovascular: Normal rate, regular rhythm, normal heart sounds and intact distal pulses.  Exam reveals no gallop and no friction rub.   No murmur heard. Pulmonary/Chest: Effort normal and breath sounds normal. He has no wheezes. He has no rales.  Abdominal: Soft. Bowel sounds are normal. He exhibits no distension and no mass. There is no tenderness. There is no rebound and no guarding.  Musculoskeletal:       Right shoulder: Normal.       Left shoulder: Normal.       Cervical back: Normal.  Lymphadenopathy:    He has no cervical adenopathy.  Neurological: He is alert and oriented to person, place, and time. He has normal reflexes. No cranial nerve deficit. He exhibits normal muscle tone. Coordination normal.  Skin: Skin is warm and dry. No rash noted. He is not diaphoretic.  Psychiatric: He has a normal mood and affect. His behavior is  normal. Judgment and thought content normal.   Depression screen [facility] 2/9 01/11/2017 06/08/2016 06/02/2016 06/02/2016 12/25/2015  Decreased Interest 0 0 0 0 0  Down, Depressed, Hopeless 0 0 0 0 0  PHQ - 2 Score 0 0 0 0 0  Some  recent data might be hidden   Fall Risk  01/11/2017 06/08/2016 06/02/2016 06/02/2016 03/30/2016  Falls in the past year? No Yes Yes Yes Yes  Number falls in past yr: - 2 or more 1 1 1   Injury with Fall? - Yes - - Yes  Risk Factor Category  - - - - -  Risk for fall due to : - - - - -  Risk for fall due to (comments): - - - - -   Functional Status Survey: Is the patient deaf or have difficulty hearing?: No Does the patient have difficulty seeing, even when wearing glasses/contacts?: No Does the patient have difficulty concentrating, remembering, or making decisions?: No Does the patient have difficulty walking or climbing stairs?: Yes Does the patient have difficulty dressing or bathing?: No Does the patient have difficulty doing errands alone such as visiting a doctor's office or shopping?: No      Assessment & Plan:   1. Encounter for Medicare annual wellness exam   2. Routine physical examination   3. Essential hypertension   4. Ischemic cardiomyopathy   5. Personal history of malignant neoplasm of rectum, rectosigmoid junction, and anus   6. Type 2 diabetes mellitus with stage 3 chronic kidney disease, with long-term current use of insulin (HCC)   7. Early onset Alzheimer's dementia without behavioral disturbance   8. Benign prostatic hyperplasia with urinary frequency   9. Stage 3 chronic kidney disease   10. 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 S/P left colectomy   12. Anemia of chronic disease   13. Seizures ([place])   14. Pure hypercholesterolemia    -anticipatory guidance provided --- discussed living will and advanced directives.  Good family support. No longer warrants preventative services due to age.   -no evidence of depression; moderate fall risk due to  age, dementia. -obtain labs for chronic disease management; continue current medications. -refills provided.  Orders Placed This Encounter  Procedures  . CBC with Differential/Platelet  . Comprehensive metabolic panel    Order Specific Question:   Has the patient fasted?    Answer:   Yes  . Hemoglobin A1c  . Lipid panel    Order Specific Question:   Has the patient fasted?    Answer:   Yes  . TSH  . Microalbumin, urine  . POCT urinalysis dipstick   No orders of the defined types were placed in this encounter.   No Follow-up on file.   Adryan Druckenmiller Elayne Guerin, M.D. Primary Care at [facility] [address] [place], [place]  [address] [phone] phone [phone] fax

## 2017-01-12 LAB — COMPREHENSIVE METABOLIC PANEL
A/G RATIO: 1.4 (ref 1.2–2.2)
ALT: 11 IU/L (ref 0–44)
AST: 16 IU/L (ref 0–40)
Albumin: 4.3 g/dL (ref 3.5–4.7)
Alkaline Phosphatase: 95 IU/L (ref 39–117)
BUN/Creatinine Ratio: 14 (ref 10–24)
BUN: 20 mg/dL (ref 8–27)
Bilirubin Total: 0.3 mg/dL (ref 0.0–1.2)
CALCIUM: 9.1 mg/dL (ref 8.6–10.2)
CO2: 27 mmol/L (ref 18–29)
Chloride: 96 mmol/L (ref 96–106)
Creatinine, Ser: 1.48 mg/dL — ABNORMAL HIGH (ref 0.76–1.27)
GFR, EST AFRICAN AMERICAN: 50 mL/min/{1.73_m2} — AB (ref >59)
GFR, EST NON AFRICAN AMERICAN: 43 mL/min/{1.73_m2} — AB (ref >59)
GLOBULIN, TOTAL: 3 g/dL (ref 1.5–4.5)
Glucose: 126 mg/dL — ABNORMAL HIGH (ref 65–99)
POTASSIUM: 5.5 mmol/L — AB (ref 3.5–5.2)
SODIUM: 139 mmol/L (ref 134–144)
Total Protein: 7.3 g/dL (ref 6.0–8.5)

## 2017-01-12 LAB — CBC WITH DIFFERENTIAL/PLATELET
BASOS: 0 %
Basophils Absolute: 0 10*3/uL (ref 0.0–0.2)
EOS (ABSOLUTE): 0.1 10*3/uL (ref 0.0–0.4)
EOS: 1 %
HEMATOCRIT: 38.5 % (ref 37.5–51.0)
Hemoglobin: 12 g/dL — ABNORMAL LOW (ref 13.0–17.7)
IMMATURE GRANULOCYTES: 0 %
Immature Grans (Abs): 0 10*3/uL (ref 0.0–0.1)
LYMPHS ABS: 3.3 10*3/uL — AB (ref 0.7–3.1)
Lymphs: 30 %
MCH: 26 pg — ABNORMAL LOW (ref 26.6–33.0)
MCHC: 31.2 g/dL — ABNORMAL LOW (ref 31.5–35.7)
MCV: 84 fL (ref 79–97)
MONOS ABS: 0.6 10*3/uL (ref 0.1–0.9)
Monocytes: 6 %
NEUTROS ABS: 6.8 10*3/uL (ref 1.4–7.0)
Neutrophils: 63 %
Platelets: 271 10*3/uL (ref 150–379)
RBC: 4.61 x10E6/uL (ref 4.14–5.80)
RDW: 15.1 % (ref 12.3–15.4)
WBC: 10.9 10*3/uL — ABNORMAL HIGH (ref 3.4–10.8)

## 2017-01-12 LAB — LIPID PANEL
CHOL/HDL RATIO: 3.4 ratio (ref 0.0–5.0)
Cholesterol, Total: 134 mg/dL (ref 100–199)
HDL: 39 mg/dL — ABNORMAL LOW (ref >39)
LDL Calculated: 69 mg/dL (ref 0–99)
Triglycerides: 132 mg/dL (ref 0–149)
VLDL CHOLESTEROL CAL: 26 mg/dL (ref 5–40)

## 2017-01-12 LAB — HEMOGLOBIN A1C
ESTIMATED AVERAGE GLUCOSE: 177 mg/dL
Hgb A1c MFr Bld: 7.8 % — ABNORMAL HIGH (ref 4.8–5.6)

## 2017-01-12 LAB — MICROALBUMIN, URINE: MICROALBUM., U, RANDOM: 47.4 ug/mL

## 2017-01-12 LAB — TSH: TSH: 2.75 u[IU]/mL (ref 0.450–4.500)

## 2017-01-14 ENCOUNTER — Telehealth: Payer: Self-pay | Admitting: Family Medicine

## 2017-01-14 DIAGNOSIS — G8929 Other chronic pain: Secondary | ICD-10-CM

## 2017-01-14 DIAGNOSIS — R29898 Other symptoms and signs involving the musculoskeletal system: Secondary | ICD-10-CM

## 2017-01-14 DIAGNOSIS — M545 Low back pain: Principal | ICD-10-CM

## 2017-01-14 NOTE — Telephone Encounter (Signed)
Low back pain now and therapist said she can address that with a referral.   uses P/T now for other daily movement/strength.

## 2017-01-14 NOTE — Telephone Encounter (Signed)
Daughter called to as for referral// hopefully by Tuesday 01/18/2017  [facility]- [place]

## 2017-01-14 NOTE — Telephone Encounter (Signed)
Please clarify reason for physical therapy referral?

## 2017-01-14 NOTE — Telephone Encounter (Signed)
Please place order if appropriate.

## 2017-01-19 ENCOUNTER — Ambulatory Visit (INDEPENDENT_AMBULATORY_CARE_PROVIDER_SITE_OTHER): Payer: Medicare Other | Admitting: Podiatry

## 2017-01-19 ENCOUNTER — Encounter: Payer: Self-pay | Admitting: Podiatry

## 2017-01-19 VITALS — BP 136/65 | HR 69 | Resp 18

## 2017-01-19 DIAGNOSIS — E1142 Type 2 diabetes mellitus with diabetic polyneuropathy: Secondary | ICD-10-CM | POA: Diagnosis not present

## 2017-01-19 DIAGNOSIS — B351 Tinea unguium: Secondary | ICD-10-CM

## 2017-01-19 NOTE — Telephone Encounter (Signed)
[facility] calling to verify referral and have it faxed over. I will fax once it is placed.   Best fax: [phone]

## 2017-01-19 NOTE — Patient Instructions (Signed)

## 2017-01-19 NOTE — Progress Notes (Signed)
Patient ID: [name], male   DOB: 1932/11/27, 83 y.o.   MRN: [id number]    This patient presents for scheduled visit complaining of thickened and elongated toenails 6-10 and requesting nail debridement. The patient's  daughter is present in the treatment room today.  Objective: Patient appears responsive and able answer questioning and appears orientated 3 DP pulses 1/4 bilaterally PT pulses 2/4 bilaterally Capillary reflex immediate bilaterally Sensation to 10 g monofilament wire intact 1/5 right 0/5 left Vibratory sensation nonreactive bilaterally Ankle reflex equal and reactive bilaterally Hammertoe second and third bilaterally Hammertoe 2-3 bilaterally Manual motor testing dorsi flexion, plantar flexion, inversion, eversion 5/5 bilaterally Patient walks slowly with a cane No open skin lesions bilaterally The toenails are hypertrophic, elongated, deformed, discolored 6-10  Assessment: Mycotic toenails 6-10 Diabetic with peripheral neuropathy  Plan: Debridement toenails 6-10 mechanically and electrically without any bleeding  Reappoint 3 months

## 2017-01-20 NOTE — Telephone Encounter (Signed)
Referral for physical therapy approved.  Please proceed.  Routing to referrals pool to process.

## 2017-01-21 NOTE — Telephone Encounter (Signed)
Sent!

## 2017-01-26 DIAGNOSIS — M545 Low back pain: Secondary | ICD-10-CM | POA: Diagnosis not present

## 2017-01-26 DIAGNOSIS — M6281 Muscle weakness (generalized): Secondary | ICD-10-CM | POA: Diagnosis not present

## 2017-01-27 DIAGNOSIS — M545 Low back pain: Secondary | ICD-10-CM | POA: Diagnosis not present

## 2017-02-01 DIAGNOSIS — M545 Low back pain: Secondary | ICD-10-CM | POA: Diagnosis not present

## 2017-02-01 DIAGNOSIS — R262 Difficulty in walking, not elsewhere classified: Secondary | ICD-10-CM | POA: Diagnosis not present

## 2017-02-01 DIAGNOSIS — M6281 Muscle weakness (generalized): Secondary | ICD-10-CM | POA: Diagnosis not present

## 2017-02-03 DIAGNOSIS — M6281 Muscle weakness (generalized): Secondary | ICD-10-CM | POA: Diagnosis not present

## 2017-02-03 DIAGNOSIS — M545 Low back pain: Secondary | ICD-10-CM | POA: Diagnosis not present

## 2017-02-03 DIAGNOSIS — M25651 Stiffness of right hip, not elsewhere classified: Secondary | ICD-10-CM | POA: Diagnosis not present

## 2017-02-08 DIAGNOSIS — M545 Low back pain: Secondary | ICD-10-CM | POA: Diagnosis not present

## 2017-02-10 DIAGNOSIS — R262 Difficulty in walking, not elsewhere classified: Secondary | ICD-10-CM | POA: Diagnosis not present

## 2017-02-10 DIAGNOSIS — M25651 Stiffness of right hip, not elsewhere classified: Secondary | ICD-10-CM | POA: Diagnosis not present

## 2017-02-10 DIAGNOSIS — M545 Low back pain: Secondary | ICD-10-CM | POA: Diagnosis not present

## 2017-02-15 DIAGNOSIS — M545 Low back pain: Secondary | ICD-10-CM | POA: Diagnosis not present

## 2017-02-15 DIAGNOSIS — M25651 Stiffness of right hip, not elsewhere classified: Secondary | ICD-10-CM | POA: Diagnosis not present

## 2017-02-15 DIAGNOSIS — R262 Difficulty in walking, not elsewhere classified: Secondary | ICD-10-CM | POA: Diagnosis not present

## 2017-02-17 DIAGNOSIS — R262 Difficulty in walking, not elsewhere classified: Secondary | ICD-10-CM | POA: Diagnosis not present

## 2017-02-17 DIAGNOSIS — M545 Low back pain: Secondary | ICD-10-CM | POA: Diagnosis not present

## 2017-02-22 DIAGNOSIS — M6281 Muscle weakness (generalized): Secondary | ICD-10-CM | POA: Diagnosis not present

## 2017-02-22 DIAGNOSIS — M545 Low back pain: Secondary | ICD-10-CM | POA: Diagnosis not present

## 2017-02-24 DIAGNOSIS — M6281 Muscle weakness (generalized): Secondary | ICD-10-CM | POA: Diagnosis not present

## 2017-02-24 DIAGNOSIS — M25651 Stiffness of right hip, not elsewhere classified: Secondary | ICD-10-CM | POA: Diagnosis not present

## 2017-02-24 DIAGNOSIS — M545 Low back pain: Secondary | ICD-10-CM | POA: Diagnosis not present

## 2017-02-24 DIAGNOSIS — R262 Difficulty in walking, not elsewhere classified: Secondary | ICD-10-CM | POA: Diagnosis not present

## 2017-03-01 ENCOUNTER — Telehealth: Payer: Self-pay | Admitting: Family Medicine

## 2017-03-01 DIAGNOSIS — M6281 Muscle weakness (generalized): Secondary | ICD-10-CM | POA: Diagnosis not present

## 2017-03-01 DIAGNOSIS — M545 Low back pain: Secondary | ICD-10-CM | POA: Diagnosis not present

## 2017-03-01 DIAGNOSIS — M25651 Stiffness of right hip, not elsewhere classified: Secondary | ICD-10-CM | POA: Diagnosis not present

## 2017-03-01 DIAGNOSIS — R262 Difficulty in walking, not elsewhere classified: Secondary | ICD-10-CM | POA: Diagnosis not present

## 2017-03-01 DIAGNOSIS — M25511 Pain in right shoulder: Secondary | ICD-10-CM

## 2017-03-01 NOTE — Telephone Encounter (Signed)
[name] from [facility] states that they are treating [name] for his back and his daughter [name] would like for patient to have some medicine called or faxed over to them because [name] was complaining about his rt shoulder [name] will be discharging [name] today for his back but patient has an appointment on Thursday and they will follow up on his shoulder please fax order to [facility] at [phone]

## 2017-03-02 NOTE — Telephone Encounter (Signed)
Will you write this referral for shoulder

## 2017-03-04 NOTE — Telephone Encounter (Signed)
Yes.  Referral for physical therapy for R shoulder pain ordered.

## 2017-03-04 NOTE — Addendum Note (Signed)
Addended by: Wardell Honour on: 03/04/2017 09:41 AM   Modules accepted: Orders

## 2017-03-07 DIAGNOSIS — M25511 Pain in right shoulder: Secondary | ICD-10-CM | POA: Diagnosis not present

## 2017-03-07 DIAGNOSIS — M25611 Stiffness of right shoulder, not elsewhere classified: Secondary | ICD-10-CM | POA: Diagnosis not present

## 2017-03-07 DIAGNOSIS — M6281 Muscle weakness (generalized): Secondary | ICD-10-CM | POA: Diagnosis not present

## 2017-03-23 ENCOUNTER — Other Ambulatory Visit: Payer: Self-pay | Admitting: Physician Assistant

## 2017-03-23 DIAGNOSIS — L570 Actinic keratosis: Secondary | ICD-10-CM | POA: Diagnosis not present

## 2017-03-23 DIAGNOSIS — D492 Neoplasm of unspecified behavior of bone, soft tissue, and skin: Secondary | ICD-10-CM | POA: Diagnosis not present

## 2017-03-23 DIAGNOSIS — D0461 Carcinoma in situ of skin of right upper limb, including shoulder: Secondary | ICD-10-CM | POA: Diagnosis not present

## 2017-03-23 DIAGNOSIS — C44729 Squamous cell carcinoma of skin of left lower limb, including hip: Secondary | ICD-10-CM | POA: Diagnosis not present

## 2017-03-31 ENCOUNTER — Ambulatory Visit (INDEPENDENT_AMBULATORY_CARE_PROVIDER_SITE_OTHER): Payer: Medicare Other | Admitting: Podiatry

## 2017-03-31 ENCOUNTER — Encounter: Payer: Self-pay | Admitting: Podiatry

## 2017-03-31 DIAGNOSIS — E1142 Type 2 diabetes mellitus with diabetic polyneuropathy: Secondary | ICD-10-CM

## 2017-03-31 DIAGNOSIS — B351 Tinea unguium: Secondary | ICD-10-CM | POA: Diagnosis not present

## 2017-03-31 DIAGNOSIS — M79676 Pain in unspecified toe(s): Secondary | ICD-10-CM | POA: Diagnosis not present

## 2017-03-31 NOTE — Progress Notes (Signed)
He presents to the office today concerned about the loss of the toenail to the third digit of the right foot.  Objective: Toenails are long thick yellow dystrophic onychomycotic. Pulses are palpable. His nails to the third digit of the right foot has completely followed often has gone on to heal uneventfully.  Assessment: Pain limb segment onychomycosis.  Plan: I will debride his nails today and return him to Dr. Amalia Hailey. I will follow-up with him as needed.

## 2017-04-12 ENCOUNTER — Ambulatory Visit (INDEPENDENT_AMBULATORY_CARE_PROVIDER_SITE_OTHER): Payer: Medicare Other | Admitting: Family Medicine

## 2017-04-12 ENCOUNTER — Encounter: Payer: Self-pay | Admitting: Family Medicine

## 2017-04-12 ENCOUNTER — Ambulatory Visit (INDEPENDENT_AMBULATORY_CARE_PROVIDER_SITE_OTHER): Payer: Medicare Other

## 2017-04-12 VITALS — BP 153/68 | HR 53 | Temp 97.7°F | Resp 16 | Ht 75.0 in | Wt 221.2 lb

## 2017-04-12 DIAGNOSIS — R569 Unspecified convulsions: Secondary | ICD-10-CM

## 2017-04-12 DIAGNOSIS — G300 Alzheimer's disease with early onset: Secondary | ICD-10-CM

## 2017-04-12 DIAGNOSIS — N183 Chronic kidney disease, stage 3 unspecified: Secondary | ICD-10-CM

## 2017-04-12 DIAGNOSIS — F0280 Dementia in other diseases classified elsewhere without behavioral disturbance: Secondary | ICD-10-CM

## 2017-04-12 DIAGNOSIS — I255 Ischemic cardiomyopathy: Secondary | ICD-10-CM | POA: Diagnosis not present

## 2017-04-12 DIAGNOSIS — E1122 Type 2 diabetes mellitus with diabetic chronic kidney disease: Secondary | ICD-10-CM

## 2017-04-12 DIAGNOSIS — R350 Frequency of micturition: Secondary | ICD-10-CM

## 2017-04-12 DIAGNOSIS — Z794 Long term (current) use of insulin: Secondary | ICD-10-CM

## 2017-04-12 DIAGNOSIS — M545 Low back pain, unspecified: Secondary | ICD-10-CM

## 2017-04-12 DIAGNOSIS — M25511 Pain in right shoulder: Secondary | ICD-10-CM

## 2017-04-12 DIAGNOSIS — N401 Enlarged prostate with lower urinary tract symptoms: Secondary | ICD-10-CM

## 2017-04-12 DIAGNOSIS — G8929 Other chronic pain: Secondary | ICD-10-CM | POA: Diagnosis not present

## 2017-04-12 DIAGNOSIS — Z85048 Personal history of other malignant neoplasm of rectum, rectosigmoid junction, and anus: Secondary | ICD-10-CM

## 2017-04-12 DIAGNOSIS — I10 Essential (primary) hypertension: Secondary | ICD-10-CM

## 2017-04-12 LAB — POCT GLYCOSYLATED HEMOGLOBIN (HGB A1C): Hemoglobin A1C: 7.6

## 2017-04-12 LAB — GLUCOSE, POCT (MANUAL RESULT ENTRY): POC Glucose: 205 mg/dl — AB (ref 70–99)

## 2017-04-12 MED ORDER — INSULIN GLARGINE 100 UNIT/ML ~~LOC~~ SOLN: 8.0000 [IU] | Freq: Every day | SUBCUTANEOUS | 11 refills | Status: DC

## 2017-04-12 NOTE — Patient Instructions (Signed)
     IF you received an x-ray today, you will receive an invoice from [facility]. Please contact [facility] at [phone] with questions or concerns regarding your invoice.   IF you received labwork today, you will receive an invoice from [facility]. Please contact [facility] at [phone] with questions or concerns regarding your invoice.   Our billing staff will not be able to assist you with questions regarding bills from these companies.  You will be contacted with the lab results as soon as they are available. The fastest way to get your results is to activate your My Chart account. Instructions are located on the last page of this paperwork. If you have not heard from us regarding the results in 2 weeks, please contact this office.     

## 2017-04-12 NOTE — Progress Notes (Signed)
Subjective:    Patient ID: [name], male    DOB: 1932-01-28, 83 y.o.   MRN: [id number]  04/12/2017  Follow-up (3 month)   HPI This 83 y.o. male presents for three month follow-up of DMII, hypercholesterolemia, cardiomyopathy, hypercholesterolemia, renal insufficiency, chronic lower back pain, chronic R shoulder pain.  Referred for further physical therapy at last visit.  Got multiple bug bites when outside recently.  Arms are broken out; applying anti-itch.  Usually uses bug spray.  Did not continue physical therapy for shoulder.  Will not eat meat; only wants starches and sweets.  Won't eat bacon.  Ate bacon; usually gives to the dogs.  Won't eat sausage. Sugars are averaging 220 fasting; last night sugar was 340. Sliding scale insulin for sugars Novolin R.  No long acting insulin and not sure why.  No lunch time insulin; want to avoid hypoglycemia with lunch.  Checks sugar fasting in the morning before food and nighttime before food.  Three meals per day; eats like a bird except for carbs and sugars.    10 units every am and 12 units with supper.  R humerus fracture and radial head fracture: s/p two sessions of physical therapy.  s/p consultation by ortho two years ago.    Lower back pain: doing well.  s/p physical therapy.  Chronic lower back pain.  Pain only with standing/weightbearing. Tylenol as needed.  Gets constipated on Tylenol.  Lots of fruit.      BP Readings from Last 3 Encounters:  04/12/17 (!) 153/68  01/19/17 136/65  01/11/17 (!) 142/63   Wt Readings from Last 3 Encounters:  04/12/17 221 lb 3.2 oz (100.3 kg)  01/11/17 223 lb (101.2 kg)  10/13/16 223 lb 12.8 oz (101.5 kg)   Immunization History  Administered Date(s) Administered  . Influenza Split 10/31/2012  . Influenza,inj,Quad PF,36+ Mos 10/23/2013, 08/14/2014, 08/14/2015  . Influenza-Unspecified 10/01/2016  . Pneumococcal Conjugate-13 07/02/2014  . Pneumococcal Polysaccharide-23 12/10/2003, 06/08/2016  .  Tdap 05/20/2015    Review of Systems  Constitutional: Negative for activity change, appetite change, chills, diaphoresis, fatigue and fever.  Respiratory: Negative for cough and shortness of breath.   Cardiovascular: Negative for chest pain, palpitations and leg swelling.  Gastrointestinal: Negative for abdominal pain, diarrhea, nausea and vomiting.  Endocrine: Negative for cold intolerance, heat intolerance, polydipsia, polyphagia and polyuria.  Musculoskeletal: Positive for arthralgias and back pain.  Skin: Positive for rash. Negative for color change and wound.  Neurological: Negative for dizziness, tremors, seizures, syncope, facial asymmetry, speech difficulty, weakness, light-headedness, numbness and headaches.  Psychiatric/Behavioral: Negative for dysphoric mood and sleep disturbance. The patient is not nervous/anxious.     Past Medical History:  Diagnosis Date  . Anemia   . Aortic root dilatation ([place])   . BPH (benign prostatic hyperplasia)    increased PSA  . CAD (coronary artery disease)    Presumed from previously mildy abnormal myoview;  myoview 4/12: EF 73%, no ischemia  . Cardiomyopathy    a. echo 5/12: EF 45-50%, mild LVH, grade 2 diast dysfxn, RAE  . Chronic kidney disease    renal insuff, increased uric acid  . Colon cancer ([place])   . Diabetes mellitus   . DM2 (diabetes mellitus, type 2) ([place])   . HLD (hyperlipidemia)   . HTN (hypertension)   . Leukopenia   . Pneumothorax   . Rectal cancer ([place])   . Seizures ([place])   . Stroke [facility])    Past Surgical History:  Procedure  Laterality Date  . CATARACT EXTRACTION, BILATERAL    . COLON SURGERY    . FEMUR IM NAIL Right 11/16/2015   Procedure: INTRAMEDULLARY TROCHANTERIC HIP NAILING;  Surgeon: Rod Can, MD;  Location: [facility];  Service: Orthopedics;  Laterality: Right;  . HIP ARTHROPLASTY    . ILEOSTOMY  12/2010  . JOINT REPLACEMENT    . reversal ileostomy  03/2011  . S/P resection of rectal cancer    .  TONSILLECTOMY    . VASECTOMY     No Known Allergies  Social History   Social History  . Marital status: Widowed    Spouse name: N/A  . Number of children: 2  . Years of education: N/A   Occupational History  . retired    Social History Main Topics  . Smoking status: Never Smoker  . Smokeless tobacco: Never Used  . Alcohol use Yes  . Drug use: No  . Sexual activity: Not Currently   Other Topics Concern  . Not on file   Social History Narrative   Marital status: widowed since 2014; married x 56 years.  Not dating..      Children:  2 children; 2 grandchildren; no gg      Lives; with daughter; pt has babysitter five days per week for six hours. Five dogs; expecting puppies.      Employed: retired; Administrator; trucking Administrator      Alcohol: none now      Exercise:  None      ADLs: cane for ambulation; no driving in [address]32; no cooking; daughter does grocery shopping; daughter does finances.     Advanced Directives:  Not sure if has a living will.  FULL CODE; no prolonged measures.HCPOA: [name]; has a son.    Family History  Problem Relation Age of Onset  . Parkinsonism Father   . Alzheimer's disease Unknown   . Parkinsonism Other   . Stomach cancer Neg Hx   . Colon cancer Neg Hx        Objective:    BP (!) 153/68   Pulse (!) 53   Temp 97.7 F (36.5 C) (Oral)   Resp 16   Ht 6\' 3"  (1.905 m)   Wt 221 lb 3.2 oz (100.3 kg)   SpO2 98%   BMI 27.65 kg/m  Physical Exam  Constitutional: He is oriented to person, place, and time. He appears well-developed and well-nourished. No distress.  HENT:  Head: Normocephalic and atraumatic.  Right Ear: External ear normal.  Left Ear: External ear normal.  Nose: Nose normal.  Mouth/Throat: Oropharynx is clear and moist.  Eyes: Conjunctivae and EOM are normal. Pupils are equal, round, and reactive to light.  Neck: Normal range of motion. Neck supple. Carotid bruit is not present. No thyromegaly present.    Cardiovascular: Normal rate, regular rhythm, normal heart sounds and intact distal pulses.  Exam reveals no gallop and no friction rub.   No murmur heard. Pulmonary/Chest: Effort normal and breath sounds normal. He has no wheezes. He has no rales.  Abdominal: Soft. Bowel sounds are normal. He exhibits no distension and no mass. There is no tenderness. There is no rebound and no guarding.  Musculoskeletal:       Right shoulder: He exhibits decreased range of motion.       Left shoulder: He exhibits decreased range of motion.       Lumbar back: He exhibits decreased range of motion.  Lymphadenopathy:  He has no cervical adenopathy.  Neurological: He is alert and oriented to person, place, and time. No cranial nerve deficit.  Skin: Skin is warm and dry. No rash noted. He is not diaphoretic.  Psychiatric: He has a normal mood and affect. His behavior is normal.  Nursing note and vitals reviewed.   Fall Risk  01/11/2017 06/08/2016 06/02/2016 06/02/2016 03/30/2016  Falls in the past year? No Yes Yes Yes Yes  Number falls in past yr: - 2 or more 1 1 1   Injury with Fall? - Yes - - Yes  Risk Factor Category  - - - - -  Risk for fall due to : - - - - -  Risk for fall due to (comments): - - - - -       Assessment & Plan:   1. Type 2 diabetes mellitus with stage 3 chronic kidney disease, with long-term current use of insulin ([place])   2. Essential hypertension   3. Ischemic cardiomyopathy   4. Personal history of malignant neoplasm of rectum, rectosigmoid junction, and anus   5. Early onset Alzheimer's dementia without behavioral disturbance   6. Benign prostatic hyperplasia with urinary frequency   7. Seizures ([place])   8. Chronic bilateral low back pain without sciatica   9. Pain in joint of right shoulder    -stable DMII; obtain labs; refill provided. -s/p physical therapy for lumbar spine and shoulder; doing well; ambulating without recent falls.  Obtain LS spine films as have not imaged in  several years.   Orders Placed This Encounter  Procedures  . DG Lumbar Spine Complete    Standing Status:   Future    Number of Occurrences:   1    Standing Expiration Date:   04/12/2018    Order Specific Question:   Reason for Exam (SYMPTOM  OR DIAGNOSIS REQUIRED)    Answer:   chronic lower back pain    Order Specific Question:   Preferred imaging location?    Answer:   External  . CBC with Differential/Platelet  . Comprehensive metabolic panel  . Microalbumin, urine  . POCT glycosylated hemoglobin (Hb A1C)  . POCT glucose (manual entry)  . HM Diabetes Foot Exam   Meds ordered this encounter  Medications  . insulin glargine (LANTUS) 100 UNIT/ML injection    Sig: Inject 0.08 mLs (8 Units total) into the skin at bedtime.    Dispense:  10 mL    Refill:  11    Return in about 3 months (around 07/13/2017) for recheck diabetes, high cholesterol.   Dywane Peruski Elayne Guerin, M.D. Primary Care at [facility] [address] [place], [place]  [address] [phone] phone [phone] fax

## 2017-04-13 LAB — COMPREHENSIVE METABOLIC PANEL
ALK PHOS: 102 IU/L (ref 39–117)
ALT: 10 IU/L (ref 0–44)
AST: 13 IU/L (ref 0–40)
Albumin/Globulin Ratio: 1.5 (ref 1.2–2.2)
Albumin: 4.1 g/dL (ref 3.5–4.7)
BILIRUBIN TOTAL: 0.2 mg/dL (ref 0.0–1.2)
BUN/Creatinine Ratio: 10 (ref 10–24)
BUN: 17 mg/dL (ref 8–27)
CHLORIDE: 96 mmol/L (ref 96–106)
CO2: 27 mmol/L (ref 18–29)
Calcium: 8.7 mg/dL (ref 8.6–10.2)
Creatinine, Ser: 1.7 mg/dL — ABNORMAL HIGH (ref 0.76–1.27)
GFR calc Af Amer: 42 mL/min/{1.73_m2} — ABNORMAL LOW (ref >59)
GFR calc non Af Amer: 37 mL/min/{1.73_m2} — ABNORMAL LOW (ref >59)
GLUCOSE: 181 mg/dL — AB (ref 65–99)
Globulin, Total: 2.8 g/dL (ref 1.5–4.5)
Potassium: 5.5 mmol/L — ABNORMAL HIGH (ref 3.5–5.2)
Sodium: 139 mmol/L (ref 134–144)
TOTAL PROTEIN: 6.9 g/dL (ref 6.0–8.5)

## 2017-04-13 LAB — CBC WITH DIFFERENTIAL/PLATELET
BASOS ABS: 0 10*3/uL (ref 0.0–0.2)
Basos: 1 %
EOS (ABSOLUTE): 0.1 10*3/uL (ref 0.0–0.4)
Eos: 2 %
Hematocrit: 36.5 % — ABNORMAL LOW (ref 37.5–51.0)
Hemoglobin: 11.4 g/dL — ABNORMAL LOW (ref 13.0–17.7)
Immature Grans (Abs): 0 10*3/uL (ref 0.0–0.1)
Immature Granulocytes: 0 %
LYMPHS ABS: 2 10*3/uL (ref 0.7–3.1)
Lymphs: 28 %
MCH: 26.8 pg (ref 26.6–33.0)
MCHC: 31.2 g/dL — AB (ref 31.5–35.7)
MCV: 86 fL (ref 79–97)
MONOS ABS: 0.4 10*3/uL (ref 0.1–0.9)
Monocytes: 5 %
Neutrophils Absolute: 4.8 10*3/uL (ref 1.4–7.0)
Neutrophils: 64 %
PLATELETS: 243 10*3/uL (ref 150–379)
RBC: 4.26 x10E6/uL (ref 4.14–5.80)
RDW: 14.6 % (ref 12.3–15.4)
WBC: 7.4 10*3/uL (ref 3.4–10.8)

## 2017-04-13 LAB — MICROALBUMIN, URINE: MICROALBUM., U, RANDOM: 65.7 ug/mL

## 2017-04-20 ENCOUNTER — Ambulatory Visit: Payer: Medicare Other | Admitting: Podiatry

## 2017-04-21 DIAGNOSIS — G301 Alzheimer's disease with late onset: Secondary | ICD-10-CM | POA: Diagnosis not present

## 2017-05-19 ENCOUNTER — Other Ambulatory Visit: Payer: Self-pay | Admitting: Physician Assistant

## 2017-05-19 DIAGNOSIS — L570 Actinic keratosis: Secondary | ICD-10-CM | POA: Diagnosis not present

## 2017-05-19 DIAGNOSIS — D492 Neoplasm of unspecified behavior of bone, soft tissue, and skin: Secondary | ICD-10-CM | POA: Diagnosis not present

## 2017-05-19 DIAGNOSIS — L820 Inflamed seborrheic keratosis: Secondary | ICD-10-CM | POA: Diagnosis not present

## 2017-05-23 ENCOUNTER — Encounter: Payer: Self-pay | Admitting: Neurology

## 2017-06-01 ENCOUNTER — Encounter: Payer: Self-pay | Admitting: Podiatry

## 2017-06-01 ENCOUNTER — Ambulatory Visit (INDEPENDENT_AMBULATORY_CARE_PROVIDER_SITE_OTHER): Payer: Medicare Other | Admitting: Podiatry

## 2017-06-01 DIAGNOSIS — B351 Tinea unguium: Secondary | ICD-10-CM | POA: Diagnosis not present

## 2017-06-01 DIAGNOSIS — E1142 Type 2 diabetes mellitus with diabetic polyneuropathy: Secondary | ICD-10-CM | POA: Diagnosis not present

## 2017-06-01 NOTE — Patient Instructions (Signed)

## 2017-06-02 NOTE — Progress Notes (Signed)
  Patient ID: [name], male   DOB: [birth year], 83 y.o.   MRN: [id number]   This patient presents for scheduled visit complaining of thickened and elongated toenails 6-10 and requesting nail debridement. The patient's  daughter is present in the treatment room today.  Objective: Patient appears responsive and able answer questioning and appears orientated 3 Peripheral pitting edema bilaterally DP pulses 1/4 bilaterally PT pulses 2/4 bilaterally Capillary reflex immediate bilaterally Sensation to 10 g monofilament wire intact 1/5 right 0/5 left Vibratory sensation nonreactive bilaterally Ankle reflex equal and reactive bilaterally Hammertoe second and third bilaterally Hammertoe 2-3 bilaterally Manual motor testing dorsi flexion, plantar flexion, inversion, eversion 5/5 bilaterally Patient walks slowly with a cane No open skin lesions bilaterally Atrophic skin with absent hair growth bilaterally The toenails are hypertrophic, elongated, deformed, discolored 6-10  Assessment: Mycotic toenails 6-10 Diabetic with peripheral neuropathy Diabetic with peripheral vascular disease   Plan: Debridement toenails 6-10 mechanically and electrically without any bleeding  Reappoint 3 months

## 2017-06-14 ENCOUNTER — Other Ambulatory Visit: Payer: Self-pay | Admitting: Nurse Practitioner

## 2017-07-08 ENCOUNTER — Other Ambulatory Visit: Payer: Self-pay | Admitting: Physician Assistant

## 2017-07-08 DIAGNOSIS — C44729 Squamous cell carcinoma of skin of left lower limb, including hip: Secondary | ICD-10-CM | POA: Diagnosis not present

## 2017-07-13 ENCOUNTER — Ambulatory Visit (INDEPENDENT_AMBULATORY_CARE_PROVIDER_SITE_OTHER): Payer: Medicare Other | Admitting: Neurology

## 2017-07-13 ENCOUNTER — Encounter: Payer: Self-pay | Admitting: Neurology

## 2017-07-13 VITALS — BP 142/64 | HR 77 | Ht 75.0 in | Wt 225.0 lb

## 2017-07-13 DIAGNOSIS — F0390 Unspecified dementia without behavioral disturbance: Secondary | ICD-10-CM

## 2017-07-13 DIAGNOSIS — F03A0 Unspecified dementia, mild, without behavioral disturbance, psychotic disturbance, mood disturbance, and anxiety: Secondary | ICD-10-CM

## 2017-07-13 DIAGNOSIS — G40009 Localization-related (focal) (partial) idiopathic epilepsy and epileptic syndromes with seizures of localized onset, not intractable, without status epilepticus: Secondary | ICD-10-CM | POA: Diagnosis not present

## 2017-07-13 MED ORDER — DONEPEZIL HCL 10 MG PO TABS: 10.0000 mg | ORAL_TABLET | Freq: Every day | ORAL | 3 refills | Status: DC

## 2017-07-13 MED ORDER — ZONISAMIDE 100 MG PO CAPS: ORAL_CAPSULE | ORAL | 6 refills | Status: DC

## 2017-07-13 MED ORDER — LAMOTRIGINE 100 MG PO TABS: ORAL_TABLET | ORAL | 3 refills | Status: DC

## 2017-07-13 NOTE — Patient Instructions (Signed)
1. Start Zonisamide 100mg : Take 1 capsule at night 2. Continue Lamotrigine 100mg : Take 1 in AM, 1 at noon, 2 at night 3. Continue Aricept 10mg  daily 4. Follow-up in 3-4 months, call for any changes  Seizure Precautions: 1. If medication has been prescribed for you to prevent seizures, take it exactly as directed.  Do not stop taking the medicine without talking to your doctor first, even if you have not had a seizure in a long time.   2. Avoid activities in which a seizure would cause danger to yourself or to others.  Don't operate dangerous machinery, swim alone, or climb in high or dangerous places, such as on ladders, roofs, or girders.  Do not drive unless your doctor says you may.  3. If you have any warning that you may have a seizure, lay down in a safe place where you can't hurt yourself.    4.  No driving for 6 months from last seizure, as per [facility].   Please refer to the following link on the [place] website for more information: http://[email or web address]/[email or web address]/drivingu.cfm   5.  Maintain good sleep hygiene. Avoid alcohol.  6.  Contact your doctor if you have any problems that may be related to the medicine you are taking.  7.  Call 911 and bring the patient back to the ED if:        A.  The seizure lasts longer than 5 minutes.       B.  The patient doesn't awaken shortly after the seizure  C.  The patient has new problems such as difficulty seeing, speaking or moving  D.  The patient was injured during the seizure  E.  The patient has a temperature over 102 F (39C)  F.  The patient vomited and now is having trouble breathing

## 2017-07-13 NOTE — Progress Notes (Signed)
NEUROLOGY CONSULTATION NOTE  [name] MRN: [id number] DOB: Apr 29, 1932  Referring provider: Dr. Michel [name] Primary care provider: Dr. Reginia [name]  Reason for consult:  Establish care for seizures and dementia  Dear Dr [name]:  Thank you for your kind referral of [name] for consultation of the above symptoms. Although his history is well known to you, please allow me to reiterate it for the purpose of our medical record. The patient was accompanied to the clinic by his daughter who also provides collateral information. Records and images were personally reviewed where available.  HISTORY OF PRESENT ILLNESS: This is an 83 year old right-handed man with a history of hypertension, hyperlipidemia, diabetes, CAD, stroke with no residual deficits, seizures, and dementia, presenting to establish care. He had been seeing neurologist Dr. Catalina [name], who has retired. Records from Dr. Catalina [name] were reviewed. [name] started having recurrent nocturnal episodes in 2010. His wife had described hearing a loud scream, extremities would be rigid and extended, with jerking lasting 3-4 minutes. He would be confused or sleepy after. He [name] have clusters of up to 4 at night. He was evaluated at [name] with unremarkable MRI brain and 24-hour EEG which did not show any epileptiform activity, typical events were not captured. He was started on Topiramate in 2012. He had a repeat MRI brain with and without contrast with Dr. Catalina [name] in November 2011 which did not show any acute changes. There was a small chronic lacunar infarct in the left lateral thalamus, and chronic microvascular disease. He was switched to [name] in 2013, which caused sedation and dizziness. He was then switched to Lamotrigine in 2013 with dose uptitration over the years, currently on 100, 100, 200mg  since January 2018. His daughter keeps a calendar of his seizures. He had 4 seizures in 2014, 3 in 2015, 4 in 2016, 1 in 2017. So far this year, he  has had 2, one on January 8, and most recently on 05/31/17. The last one was "more minor," but occurred during wakefulness. He came down that morning confused, unable to remember how to use his glucometer, speech as gibberish even if he thought he was making sense. There was no focal weakness noted by daughter. He then sat down and started having a seizure, with heavy breathing, stiff with extremities extended for a second, then snoring like he was drugged soon after. He wet his pants and bit his tongue. By noon, he was speaking normally. The last daytime seizure was around 3 years ago. Otherwise all his seizures have been nocturnal between 4-7am. He lives with his daughter, who notices that a few days before a seizure, he would seem to be craving food more, grazing around, when usually he is a picky eater. She reports that 3-5 days before a seizure, "he gets more mean." Seizure triggers that she has noticed are beer, sweets, and after trauma (he had a seizure after he broke his shoulder, then after he fractured his hip). He denies any olfactory/gustatory hallucinations, deja vu, rising epigastric sensation, focal numbness/tingling/weakness, myoclonic jerks.  He has also been having cognitive dysfunction since at least 2013. He is taking Aricept 10mg  daily without side effects. Overall his daughter feels his memory has been stable. His daughter has always been in charge of bills and medications, stating "he doesn't do anything for himself." He does not drive. His mother and sister were diagnosed with Alzheimer's disease. 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 Otherwise he had a  normal birth and early development.  There is no history of febrile convulsions, CNS infections such as meningitis/encephalitis, significant traumatic brain injury, neurosurgical procedures, or family history of seizures.  Prior AEDs: Keppra, Topamax EEGs: EEG done 05/2012 was abnormal due to mild diffuse  slowing MRI brain as above   PAST MEDICAL HISTORY: Past Medical History:  Diagnosis Date  . Anemia   . Aortic root dilatation ([name])   . BPH (benign prostatic hyperplasia)    increased PSA  . CAD (coronary artery disease)    Presumed from previously mildy abnormal myoview;  myoview 4/12: EF 73%, no ischemia  . Cardiomyopathy    a. echo 5/12: EF 45-50%, mild LVH, grade 2 diast dysfxn, RAE  . Chronic kidney disease    renal insuff, increased uric acid  . Colon cancer ([name])   . Diabetes mellitus   . DM2 (diabetes mellitus, type 2) ([name])   . HLD (hyperlipidemia)   . HTN (hypertension)   . Leukopenia   . Pneumothorax   . Rectal cancer ([name])   . Seizures ([name])   . Stroke [name])     PAST SURGICAL HISTORY: Past Surgical History:  Procedure Laterality Date  . CATARACT EXTRACTION, BILATERAL    . COLON SURGERY    . FEMUR IM NAIL Right 11/16/2015   Procedure: INTRAMEDULLARY TROCHANTERIC HIP NAILING;  Surgeon: [name] Can, MD;  Location: [name];  Service: Orthopedics;  Laterality: Right;  . HIP ARTHROPLASTY    . ILEOSTOMY  12/2010  . JOINT REPLACEMENT    . reversal ileostomy  03/2011  . S/P resection of rectal cancer    . TONSILLECTOMY    . VASECTOMY      MEDICATIONS: Current Outpatient Prescriptions on File Prior to Visit  Medication Sig Dispense Refill  . acetaminophen (TYLENOL) 325 MG tablet Take 650 mg by mouth every 4 (four) hours as needed. Reported on 04/12/2016    . amLODipine (NORVASC) 5 MG tablet Take 1 tablet (5 mg total) by mouth daily. 30 tablet 11  . aspirin 81 MG tablet Take 81 mg by mouth daily.    Marland Kitchen donepezil (ARICEPT) 10 MG tablet Take 10 mg by mouth daily.  1  . doxazosin (CARDURA) 4 MG tablet TAKE 1 TABLET BY MOUTH AT BEDTIME. 90 tablet 3  . ECHINACEA HERB PO Take 1 tablet by mouth daily.    . ferrous sulfate 325 (65 FE) MG tablet Take 325 mg by mouth 2 (two) times daily. Reported on 03/30/2016    . finasteride (PROSCAR) 5 MG tablet Take 1 tablet (5 mg total)  by mouth daily. 90 tablet 3  . furosemide (LASIX) 20 MG tablet Take 1 tablet (20 mg total) by mouth daily. 90 tablet 3  . glucose blood (ONE TOUCH ULTRA TEST) test strip Use to test blood sugar 3 times daily. Dx E11.22 300 each 3  . insulin glargine (LANTUS) 100 UNIT/ML injection Inject 0.08 mLs (8 Units total) into the skin at bedtime. 10 mL 11  . insulin regular (HUMULIN R) 250 units/2.82mL (100 units/mL) injection Inject 0.1-0.15 mLs (10-15 Units total) into the skin 2 (two) times daily before a meal. 10 mL 11  . INSULIN SYRINGE .5CC/28G (B-D INS SYR MICROFINE .5CC/28G) 28G X 1/2" 0.5 ML MISC USE AS DIRECTED---check sugars tid 100 each 5  . lamoTRIgine (LAMICTAL) 100 MG tablet Take 100 mg by mouth 3 (three) times daily. And Lamotrigine 100 mg  3  . metoprolol succinate (TOPROL-XL) 50 MG 24 hr tablet Take 1  tablet (50 mg total) by mouth daily. TAKE 1 TABLET BY MOUTH EVERY DAY. 90 tablet 3  . Misc. Devices MISC Stair Lift.  Use as directed.  DX Code: Z61.096E 1 each 0  . nitroGLYCERIN (NITROSTAT) 0.4 MG SL tablet Place 1 tablet (0.4 mg total) under the tongue every 5 (five) minutes as needed for chest pain. 25 tablet 3  . Nutritional Supplements (JUICE PLUS FIBRE PO) Take 2 tablets by mouth 2 (two) times daily. Reported on 04/12/2016    . pravastatin (PRAVACHOL) 20 MG tablet Take 1 tablet (20 mg total) by mouth daily. 90 tablet 3  . sennosides-docusate sodium (SENOKOT-S) 8.6-50 MG tablet Take 2 tablets by mouth at bedtime. Reported on 03/30/2016     No current facility-administered medications on file prior to visit.     ALLERGIES: No Known Allergies  FAMILY HISTORY: Family History  Problem Relation Age of Onset  . Parkinsonism Father   . Alzheimer's disease Unknown   . Parkinsonism Other   . Stomach cancer Neg Hx   . Colon cancer Neg Hx     SOCIAL HISTORY: Social History   Social History  . Marital status: Widowed    Spouse name: N/A  . Number of children: 2  . Years of education:  N/A   Occupational History  . retired    Social History Main Topics  . Smoking status: Never Smoker  . Smokeless tobacco: Never Used  . Alcohol use Yes  . Drug use: No  . Sexual activity: Not Currently   Other Topics Concern  . Not on file   Social History Narrative   Marital status: widowed since 2014; married x 56 years.  Not dating..      Children:  2 children; 2 grandchildren; no gg      Lives; with daughter; pt has babysitter five days per week for six hours. Five dogs; expecting puppies.      Employed: retired; Administrator; trucking Administrator      Alcohol: none now      Exercise:  None      ADLs: cane for ambulation; no driving in 4540; no cooking; daughter does grocery shopping; daughter does finances.     Advanced Directives:  Not sure if has a living will.  FULL CODE; no prolonged measures.HCPOA: [name]; has a son.     REVIEW OF SYSTEMS: Constitutional: No fevers, chills, or sweats, no generalized fatigue, change in appetite Eyes: No visual changes, double vision, eye pain Ear, nose and throat: No hearing loss, ear pain, nasal congestion, sore throat Cardiovascular: No chest pain, palpitations Respiratory:  No shortness of breath at rest or with exertion, wheezes GastrointestinaI: No nausea, vomiting, diarrhea, abdominal pain, fecal incontinence Genitourinary:  No dysuria, urinary retention or frequency Musculoskeletal:  + neck pain, back pain Integumentary: No rash, pruritus, skin lesions Neurological: as above Psychiatric: No depression, insomnia, anxiety Endocrine: No palpitations, fatigue, diaphoresis, mood swings, change in appetite, change in weight, increased thirst Hematologic/Lymphatic:  No anemia, purpura, petechiae. Allergic/Immunologic: no itchy/runny eyes, nasal congestion, recent allergic reactions, rashes  PHYSICAL EXAM: Vitals:   07/13/17 1035  BP: (!) 142/64  Pulse: 77   General: No acute distress Head:   Normocephalic/atraumatic Eyes: Fundoscopic exam shows bilateral sharp discs, no vessel changes, exudates, or hemorrhages Neck: supple, no paraspinal tenderness, full range of motion Back: No paraspinal tenderness Heart: regular rate and rhythm Lungs: Clear to auscultation bilaterally. Vascular: No carotid bruits. Skin/Extremities: No rash, no  edema Neurological Exam: Mental status: alert and oriented to person, place, and time, no dysarthria or aphasia, Fund of knowledge is appropriate.  Recent and remote memory are impaired.  Attention and concentration are normal.    Able to name objects and repeat phrases. CDT 4/5 MMSE - Mini Mental State Exam 07/13/2017  Orientation to time 5  Orientation to Place 5  Registration 3  Attention/ Calculation 4  Recall 0  Language- name 2 objects 2  Language- repeat 1  Language- follow 3 step command 3  Language- read & follow direction 1  Write a sentence 1  Copy design 1  Total score 26   Cranial nerves: CN I: not tested CN II: pupils equal, round and reactive to light, visual fields intact, fundi unremarkable. CN III, IV, VI:  full range of motion, no nystagmus, no ptosis CN V: facial sensation intact CN VII: upper and lower face symmetric CN VIII: hearing intact to finger rub CN IX, X: gag intact, uvula midline CN XI: sternocleidomastoid and trapezius muscles intact CN XII: tongue midline Bulk & Tone: normal, no fasciculations. Motor: 5/5 throughout with no pronator drift. Sensation: intact to light touch, cold, pin, vibration and joint position sense.  No extinction to double simultaneous stimulation.  Romberg test negative Deep Tendon Reflexes: +2 throughout, no ankle clonus Plantar responses: downgoing bilaterally Cerebellar: no incoordination on finger to nose, heel to shin. No dysdiadochokinesia Gait: slow and cautious with cane, no ataxia, unable to tandem walk Tremor: none  IMPRESSION: This is an 83 year old right-handed man with  a history of hypertension, hyperlipidemia, diabetes, CAD, stroke with no residual deficits, seizures, and dementia, presenting to establish care. Seizures are predominantly nocturnal, but he has had 2 seizures in wakefulness, most recently last 05/31/17. It appears he had a nocturnal seizure then woke up confused, speaking gibberish, then had a witnessed convulsion. 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 He is on a higher dose of Lamotrigine, 400mg /day, with continued seizures. We discussed side effects of further increasing dose, his daughter is concerned about dizziness and fall risk. She is agreeable to adding on low dose Zonisamide 100mg  qhs, side effects were discussed. Continue current dose of Lamotrigine 100mg  1-1-2 tabs daily. Continue Aricept 10mg  daily for mild dementia, MMSE today 26/30. [place] driving laws were discussed with the patient, and he knows to stop driving after a seizure, until 6 months seizure-free. He will follow-up in 3-4 months and knows to call for any changes.   Thank you for allowing me to participate in the care of this patient. Please do not hesitate to call for any questions or concerns.   Ellouise Newer, M.D.  CC: Dr. Tamala Julian, Dr. Catalina [name]

## 2017-07-18 ENCOUNTER — Encounter: Payer: Self-pay | Admitting: Neurology

## 2017-07-18 DIAGNOSIS — G40009 Localization-related (focal) (partial) idiopathic epilepsy and epileptic syndromes with seizures of localized onset, not intractable, without status epilepticus: Secondary | ICD-10-CM | POA: Insufficient documentation

## 2017-08-03 ENCOUNTER — Ambulatory Visit (INDEPENDENT_AMBULATORY_CARE_PROVIDER_SITE_OTHER): Payer: Medicare Other | Admitting: Podiatry

## 2017-08-03 ENCOUNTER — Encounter: Payer: Self-pay | Admitting: Podiatry

## 2017-08-03 DIAGNOSIS — B351 Tinea unguium: Secondary | ICD-10-CM | POA: Diagnosis not present

## 2017-08-03 DIAGNOSIS — E1142 Type 2 diabetes mellitus with diabetic polyneuropathy: Secondary | ICD-10-CM | POA: Diagnosis not present

## 2017-08-03 DIAGNOSIS — M79676 Pain in unspecified toe(s): Secondary | ICD-10-CM

## 2017-08-03 NOTE — Progress Notes (Signed)
Patient ID: [name], male   DOB: 07-23-1932, 83 y.o.   MRN: [id number]    This patient presents for scheduled visit complaining of thickened and elongated toenails 6-10 and requesting nail debridement. The patient's daughteris present in the treatment room today.  Objective: Patient appears responsive and able answer questioning and appears orientated 3 Peripheral pitting edema bilaterally DP pulses 1/4 bilaterally PT pulses 2/4 bilaterally Capillary reflex immediate bilaterally Sensation to 10 g monofilament wire intact 1/5 right 0/5 left Vibratory sensation nonreactive bilaterally Ankle reflex equal and reactive bilaterally Hammertoe second and third bilaterally Hammertoe 2-3 bilaterally Manual motor testing dorsi flexion, plantar flexion, inversion, eversion 5/5 bilaterally Patient walks slowly with a cane No open skin lesions bilaterally Atrophic skin with absent hair growth bilaterally The toenails are hypertrophic, elongated, deformed, discolored 6-10  Assessment: Mycotic toenails 6-10 Diabetic with peripheral neuropathy Diabetic with peripheral vascular disease   Plan: Debridement toenails 6-10 mechanically and electrically without any bleeding  Reappoint 3 months

## 2017-08-03 NOTE — Patient Instructions (Signed)

## 2017-08-12 ENCOUNTER — Other Ambulatory Visit: Payer: Self-pay | Admitting: Family Medicine

## 2017-08-13 ENCOUNTER — Other Ambulatory Visit: Payer: Self-pay | Admitting: Nurse Practitioner

## 2017-08-15 NOTE — Telephone Encounter (Signed)
Please schedule follow-up visit for DMII with me this month.

## 2017-08-18 ENCOUNTER — Other Ambulatory Visit: Payer: Self-pay | Admitting: Physician Assistant

## 2017-08-18 DIAGNOSIS — L570 Actinic keratosis: Secondary | ICD-10-CM | POA: Diagnosis not present

## 2017-08-18 DIAGNOSIS — D0461 Carcinoma in situ of skin of right upper limb, including shoulder: Secondary | ICD-10-CM | POA: Diagnosis not present

## 2017-08-23 ENCOUNTER — Ambulatory Visit (INDEPENDENT_AMBULATORY_CARE_PROVIDER_SITE_OTHER): Payer: Medicare Other | Admitting: Family Medicine

## 2017-08-23 ENCOUNTER — Encounter: Payer: Self-pay | Admitting: Family Medicine

## 2017-08-23 VITALS — BP 130/73 | HR 58 | Temp 97.6°F | Resp 16 | Ht 73.62 in | Wt 223.0 lb

## 2017-08-23 DIAGNOSIS — Z794 Long term (current) use of insulin: Secondary | ICD-10-CM

## 2017-08-23 DIAGNOSIS — Z23 Encounter for immunization: Secondary | ICD-10-CM | POA: Diagnosis not present

## 2017-08-23 DIAGNOSIS — I10 Essential (primary) hypertension: Secondary | ICD-10-CM | POA: Diagnosis not present

## 2017-08-23 DIAGNOSIS — E7800 Pure hypercholesterolemia, unspecified: Secondary | ICD-10-CM | POA: Diagnosis not present

## 2017-08-23 DIAGNOSIS — E1122 Type 2 diabetes mellitus with diabetic chronic kidney disease: Secondary | ICD-10-CM | POA: Diagnosis not present

## 2017-08-23 DIAGNOSIS — I2510 Atherosclerotic heart disease of native coronary artery without angina pectoris: Secondary | ICD-10-CM | POA: Diagnosis not present

## 2017-08-23 DIAGNOSIS — N183 Chronic kidney disease, stage 3 unspecified: Secondary | ICD-10-CM

## 2017-08-23 DIAGNOSIS — R569 Unspecified convulsions: Secondary | ICD-10-CM | POA: Diagnosis not present

## 2017-08-23 NOTE — Progress Notes (Signed)
Subjective:    Patient ID: [name], male    DOB: 1932/10/05, 83 y.o.   MRN: [id number]  08/23/2017  Hypertension (3 month follow-up); Diabetes; and Hyperlipidemia   HPI This 83 y.o. male presents with daughter for three month follow-up DMII, hypertension, hypercholesterolemia.  Management at last visit included:  S/p lumbar spine spine films at last visit on 04/12/17 that revealed advanced DDD. Has daily lower back pain; does not take anything for pain.  Tylenol sparingly.   Under 200 every morning.   Started on new Lantus  8 units qhs.  SSI Humulin qam and q evening. Started Zonisemide qhs per neurology; no recurrent seizures since then.  Aqu[place].   S/p podiatry consultation. No recent urology appointment. S/p Dr. Claiborne Billings every 3 months for actinic keratoses; horns.  Doing quite well.  No specific concerns today.  Checking sugar 1-2 times daily; daughter monitors closely as well.    BP Readings from Last 3 Encounters:  08/23/17 130/73  07/13/17 (!) 142/64  04/12/17 (!) 153/68   Wt Readings from Last 3 Encounters:  08/23/17 223 lb (101.2 kg)  07/13/17 225 lb (102.1 kg)  04/12/17 221 lb 3.2 oz (100.3 kg)   Immunization History  Administered Date(s) Administered  . Influenza Split 10/31/2012  . Influenza,inj,Quad PF,6+ Mos 10/23/2013, 08/14/2014, 08/14/2015, 08/23/2017  . Influenza-Unspecified 10/01/2016  . Pneumococcal Conjugate-13 07/02/2014  . Pneumococcal Polysaccharide-23 12/10/2003, 06/08/2016  . Tdap 05/20/2015    Review of Systems  Constitutional: Negative for activity change, appetite change, chills, diaphoresis, fatigue and fever.  Respiratory: Negative for cough and shortness of breath.   Cardiovascular: Negative for chest pain, palpitations and leg swelling.  Gastrointestinal: Negative for abdominal pain, diarrhea, nausea and vomiting.  Endocrine: Negative for cold intolerance, heat intolerance, polydipsia, polyphagia and polyuria.  Musculoskeletal:  Positive for arthralgias and back pain.  Skin: Negative for color change, rash and wound.  Neurological: Negative for dizziness, tremors, seizures, syncope, facial asymmetry, speech difficulty, weakness, light-headedness, numbness and headaches.  Psychiatric/Behavioral: Negative for dysphoric mood and sleep disturbance. The patient is not nervous/anxious.     Past Medical History:  Diagnosis Date  . Anemia   . Aortic root dilatation ([place])   . BPH (benign prostatic hyperplasia)    increased PSA  . CAD (coronary artery disease)    Presumed from previously mildy abnormal myoview;  myoview 4/12: EF 73%, no ischemia  . Cardiomyopathy    a. echo 5/12: EF 45-50%, mild LVH, grade 2 diast dysfxn, RAE  . Chronic kidney disease    renal insuff, increased uric acid  . Colon cancer ([place])   . Diabetes mellitus   . DM2 (diabetes mellitus, type 2) ([place])   . HLD (hyperlipidemia)   . HTN (hypertension)   . Leukopenia   . Pneumothorax   . Rectal cancer ([place])   . Seizures ([place])   . Stroke [facility])    Past Surgical History:  Procedure Laterality Date  . CATARACT EXTRACTION, BILATERAL    . COLON SURGERY    . FEMUR IM NAIL Right 11/16/2015   Procedure: INTRAMEDULLARY TROCHANTERIC HIP NAILING;  Surgeon: Rod Can, MD;  Location: [facility];  Service: Orthopedics;  Laterality: Right;  . HIP ARTHROPLASTY    . ILEOSTOMY  12/2010  . JOINT REPLACEMENT    . reversal ileostomy  03/2011  . S/P resection of rectal cancer    . TONSILLECTOMY    . VASECTOMY     No Known Allergies  Social History   Social  History  . Marital status: Widowed    Spouse name: N/A  . Number of children: 2  . Years of education: N/A   Occupational History  . retired    Social History Main Topics  . Smoking status: Never Smoker  . Smokeless tobacco: Never Used  . Alcohol use Yes  . Drug use: No  . Sexual activity: Not Currently   Other Topics Concern  . Not on file   Social History Narrative   Marital status:  widowed since 2014; married x 56 years.  Not dating..      Children:  2 children; 2 grandchildren; no gg      Lives; with daughter; pt has babysitter five days per week for six hours. Five dogs; expecting puppies.      Employed: retired; Administrator; trucking Administrator      Alcohol: none now      Exercise:  None      ADLs: cane for ambulation; no driving in 5784; no cooking; daughter does grocery shopping; daughter does finances.     Advanced Directives:  Not sure if has a living will.  FULL CODE; no prolonged measures.HCPOA: [name]; has a son.    Family History  Problem Relation Age of Onset  . Parkinsonism Father   . Alzheimer's disease Mother   . Alzheimer's disease Sister        Objective:    BP 130/73   Pulse (!) 58   Temp 97.6 F (36.4 C) (Oral)   Resp 16   Ht 6' 1.62" (1.87 m)   Wt 223 lb (101.2 kg)   SpO2 96%   BMI 28.93 kg/m  Physical Exam  Constitutional: He is oriented to person, place, and time. He appears well-developed and well-nourished. No distress.  HENT:  Head: Normocephalic and atraumatic.  Right Ear: External ear normal.  Left Ear: External ear normal.  Nose: Nose normal.  Mouth/Throat: Oropharynx is clear and moist.  Eyes: Pupils are equal, round, and reactive to light. Conjunctivae and EOM are normal.  Neck: Normal range of motion. Neck supple. Carotid bruit is not present. No thyromegaly present.  Cardiovascular: Normal rate, regular rhythm, normal heart sounds and intact distal pulses.  Exam reveals no gallop and no friction rub.   No murmur heard. Pulmonary/Chest: Effort normal and breath sounds normal. He has no wheezes. He has no rales.  Abdominal: Soft. Bowel sounds are normal. He exhibits no distension and no mass. There is no tenderness. There is no rebound and no guarding.  Lymphadenopathy:    He has no cervical adenopathy.  Neurological: He is alert and oriented to person, place, and time. No cranial nerve deficit.  Skin:  Skin is warm and dry. No rash noted. He is not diaphoretic.  Healing eschars along scalp and forearms B.  Psychiatric: He has a normal mood and affect. His behavior is normal.  Nursing note and vitals reviewed.   No results found. Depression screen [facility] 2/9 08/23/2017 01/11/2017 06/08/2016 06/02/2016 06/02/2016  Decreased Interest 0 0 0 0 0  Down, Depressed, Hopeless 0 0 0 0 0  PHQ - 2 Score 0 0 0 0 0  Some recent data might be hidden   Fall Risk  08/23/2017 07/13/2017 01/11/2017 06/08/2016 06/02/2016  Falls in the past year? No No No Yes Yes  Comment - - - - -  Number falls in past yr: - - - 2 or more 1  Injury with Fall? - - -  Yes -  Comment - - - right hip -  Risk Factor Category  - - - - -  Risk for fall due to : - - - - -  Risk for fall due to: Comment - - - - -        Assessment & Plan:   1. Type 2 diabetes mellitus with stage 3 chronic kidney disease, with long-term current use of insulin ([place])   2. Essential hypertension   3. Stage 3 chronic kidney disease   4. Atherosclerosis of native coronary artery of native heart without angina pectoris   5. Pure hypercholesterolemia   6. Seizures ([place])   7. Need for prophylactic vaccination and inoculation against influenza    -moderately controlled DMII; obtain labs; continue current medications. -recommend Tylenol four times daily PRN pain lower back.  - I recommend weight loss, exercise, and low-carbohydrate low-sugar food choices. You should AVOID: regular sodas, sweetened tea, fruit juices.  You should LIMIT: breads, pastas, rice, potatoes, and desserts/sweets.  I would recommend limiting your total carbohydrate intake per meal to 45 grams; I would limit your total carbohydrate intake per snack to 30 grams.  I would also have a goal of 60 grams of protein intake per day; this would equal 10-15 grams of protein per meal and 5-10 grams of protein per snack. -excellent support from daughter who lives with patient. -s/p recent neurology  consultation; no recurrent seizures with additional agent.   Orders Placed This Encounter  Procedures  . Flu Vaccine QUAD 36+ mos IM  . CBC with Differential/Platelet  . Comprehensive metabolic panel    Order Specific Question:   Has the patient fasted?    Answer:   Yes  . Lipid panel    Order Specific Question:   Has the patient fasted?    Answer:   Yes  . Hemoglobin A1c   No orders of the defined types were placed in this encounter.   Return in about 4 months (around 12/23/2017) for complete physical examiniation.   Cayman Kielbasa Elayne Guerin, M.D. Primary Care at [facility] [address] [place], [place]  [address] [phone] phone [phone] fax

## 2017-08-23 NOTE — Patient Instructions (Addendum)
  Take Tylenol for lower back pain.   IF you received an x-ray today, you will receive an invoice from [facility]. Please contact [facility] at [phone] with questions or concerns regarding your invoice.   IF you received labwork today, you will receive an invoice from [place]. Please contact [facility] at [phone] with questions or concerns regarding your invoice.   Our billing staff will not be able to assist you with questions regarding bills from these companies.  You will be contacted with the lab results as soon as they are available. The fastest way to get your results is to activate your My Chart account. Instructions are located on the last page of this paperwork. If you have not heard from [place] regarding the results in 2 weeks, please contact this office.

## 2017-08-24 LAB — CBC WITH DIFFERENTIAL/PLATELET
BASOS ABS: 0 10*3/uL (ref 0.0–0.2)
Basos: 0 %
EOS (ABSOLUTE): 0.1 10*3/uL (ref 0.0–0.4)
EOS: 1 %
HEMATOCRIT: 37.5 % (ref 37.5–51.0)
Hemoglobin: 11.8 g/dL — ABNORMAL LOW (ref 13.0–17.7)
IMMATURE GRANULOCYTES: 0 %
Immature Grans (Abs): 0 10*3/uL (ref 0.0–0.1)
Lymphocytes Absolute: 2.2 10*3/uL (ref 0.7–3.1)
Lymphs: 28 %
MCH: 26.6 pg (ref 26.6–33.0)
MCHC: 31.5 g/dL (ref 31.5–35.7)
MCV: 85 fL (ref 79–97)
MONOCYTES: 7 %
MONOS ABS: 0.5 10*3/uL (ref 0.1–0.9)
NEUTROS PCT: 64 %
Neutrophils Absolute: 5.1 10*3/uL (ref 1.4–7.0)
Platelets: 234 10*3/uL (ref 150–379)
RBC: 4.43 x10E6/uL (ref 4.14–5.80)
RDW: 14.8 % (ref 12.3–15.4)
WBC: 7.9 10*3/uL (ref 3.4–10.8)

## 2017-08-24 LAB — COMPREHENSIVE METABOLIC PANEL
ALT: 11 IU/L (ref 0–44)
AST: 15 IU/L (ref 0–40)
Albumin/Globulin Ratio: 1.6 (ref 1.2–2.2)
Albumin: 4.3 g/dL (ref 3.5–4.7)
Alkaline Phosphatase: 93 IU/L (ref 39–117)
BUN / CREAT RATIO: 12 (ref 10–24)
BUN: 21 mg/dL (ref 8–27)
Bilirubin Total: 0.2 mg/dL (ref 0.0–1.2)
CALCIUM: 8.8 mg/dL (ref 8.6–10.2)
CHLORIDE: 102 mmol/L (ref 96–106)
CO2: 23 mmol/L (ref 20–29)
Creatinine, Ser: 1.71 mg/dL — ABNORMAL HIGH (ref 0.76–1.27)
GFR calc Af Amer: 42 mL/min/{1.73_m2} — ABNORMAL LOW (ref >59)
GFR calc non Af Amer: 36 mL/min/{1.73_m2} — ABNORMAL LOW (ref >59)
GLOBULIN, TOTAL: 2.7 g/dL (ref 1.5–4.5)
GLUCOSE: 119 mg/dL — AB (ref 65–99)
POTASSIUM: 5.5 mmol/L — AB (ref 3.5–5.2)
Sodium: 139 mmol/L (ref 134–144)
Total Protein: 7 g/dL (ref 6.0–8.5)

## 2017-08-24 LAB — LIPID PANEL
Chol/HDL Ratio: 3.3 ratio (ref 0.0–5.0)
Cholesterol, Total: 118 mg/dL (ref 100–199)
HDL: 36 mg/dL — AB (ref >39)
LDL Calculated: 54 mg/dL (ref 0–99)
Triglycerides: 142 mg/dL (ref 0–149)
VLDL CHOLESTEROL CAL: 28 mg/dL (ref 5–40)

## 2017-08-24 LAB — HEMOGLOBIN A1C
ESTIMATED AVERAGE GLUCOSE: 166 mg/dL
Hgb A1c MFr Bld: 7.4 % — ABNORMAL HIGH (ref 4.8–5.6)

## 2017-08-27 ENCOUNTER — Encounter: Payer: Self-pay | Admitting: Family Medicine

## 2017-10-12 NOTE — Progress Notes (Signed)
[facility] OFFICE PROGRESS NOTE  [place], [place] [id number]  DIAGNOSIS: history of Neutropenia, Anemia   CURRENT THERAPY:  Observation   PREVIOUS THERAPY:  1. Neupogen 300 mcg subcu,  started in late December 2011, he was on 3-4 times weekly initially, and reduced to once weekly (Tuesday) since 12/13/2014, stopped on 03/19/2015 2. Cyclosporine 125 mg twice daily. Cyclosporine was started in August 2012, and stopped on 05/14/2015   INTERVAL HISTORY: [name] 83 y.o. male with a history of autoimmune neutropenia is here for follow-up. He has been doing very well in the interim. No recent admits, surgeries, or infections. He remains in good spirits and he is without acute complaints.   On review of systems, pt denies fever, chills, rash, mouth sores, weight loss, decreased appetite, urinary complaints. Denies pain. Pt denies abdominal pain, nausea, vomiting.   MEDICAL HISTORY: Past Medical History:  Diagnosis Date  . Anemia   . Aortic root dilatation ([place])   . BPH (benign prostatic hyperplasia)    increased PSA  . CAD (coronary artery disease)    Presumed from previously mildy abnormal myoview;  myoview 4/12: EF 73%, no ischemia  . Cardiomyopathy    a. echo 5/12: EF 45-50%, mild LVH, grade 2 diast dysfxn, RAE  . Chronic kidney disease    renal insuff, increased uric acid  . Colon cancer ([place])   . Diabetes mellitus   . DM2 (diabetes mellitus, type 2) ([place])   . HLD (hyperlipidemia)   . HTN (hypertension)   . Leukopenia   . Pneumothorax   . Rectal cancer ([place])   . Seizures ([place])   . Stroke [facility])    ALLERGIES:  has No Known Allergies.  MEDICATIONS: has a current medication list which includes the following prescription(s): acetaminophen, amlodipine, aspirin, b-d ins syr microfine .5cc/28g, donepezil, doxazosin, echinacea, ferrous sulfate, finasteride, furosemide, glucose blood, insulin regular, lamotrigine, metoprolol succinate,  misc. devices, nutritional supplements, pravastatin, sennosides-docusate sodium, and nitroglycerin.  SURGICAL HISTORY:  Past Surgical History:  Procedure Laterality Date  . CATARACT EXTRACTION, BILATERAL    . COLON SURGERY    . FEMUR IM NAIL Right 11/16/2015   Procedure: INTRAMEDULLARY TROCHANTERIC HIP NAILING;  Surgeon: Rod Can, MD;  Location: [facility];  Service: Orthopedics;  Laterality: Right;  . HIP ARTHROPLASTY    . ILEOSTOMY  12/2010  . JOINT REPLACEMENT    . reversal ileostomy  03/2011  . S/P resection of rectal cancer    . TONSILLECTOMY    . VASECTOMY      REVIEW OF SYSTEMS:   Constitutional: Denies fevers, chills or abnormal weight loss Eyes: Denies blurriness of vision Ears, nose, mouth, throat, and face: Denies mucositis or sore throat Respiratory: Denies cough, dyspnea or wheezes Cardiovascular: Denies palpitation, chest discomfort or lower extremity swelling Gastrointestinal:  Denies nausea, heartburn or change in bowel habits Skin: Denies abnormal skin rashes Lymphatics: Denies new lymphadenopathy or easy bruising Neurological:Denies numbness, tingling or new weaknesses Behavioral/Psych: Mood is stable, no new changes  All other systems were reviewed with the patient and are negative.  PHYSICAL EXAMINATION: ECOG PERFORMANCE STATUS: 1  Blood pressure (!) 149/56, pulse 66, temperature 97.8 F (36.6 C), temperature source Oral, resp. rate 20, height 6\' 1"  (1.854 m), weight 222 lb 6.4 oz (100.9 kg), SpO2 100 %.  GENERAL:alert, no distress and comfortable; chronically ill appearing.  SKIN: skin color, texture, turgor are normal, no rashes;  Chronic scaling of skin.  EYES: normal,  Conjunctiva are pink and non-injected, sclera clear OROPHARYNX:no exudate, no erythema and lips, buccal mucosa, and tongue normal  NECK: supple, thyroid normal size, non-tender, without nodularity LYMPH:  no palpable lymphadenopathy in the cervical, axillary or supraclavicular LUNGS:  clear to auscultation and percussion with normal breathing effort HEART: regular rate & rhythm and no murmurs and trace lower extremity edema bilaterally. ABDOMEN:abdomen soft, non-tender and normal bowel sounds Musculoskeletal:no cyanosis of digits and no clubbing; R arm with decreased ROM.  NEURO: alert & oriented x 3 with fluent speech, no focal motor/sensory deficits  LABORATORY DATA: CBC Latest Ref Rng & Units 10/13/2017 08/23/2017 04/12/2017  WBC 4.0 - 10.3 10e3/uL 8.2 7.9 7.4  Hemoglobin 13.0 - 17.1 g/dL 11.7(L) 11.8(L) 11.4(L)  Hematocrit 38.4 - 49.9 % 37.5(L) 37.5 36.5(L)  Platelets 140 - 400 10e3/uL 211 234 243    CMP Latest Ref Rng & Units 08/23/2017 04/12/2017 01/11/2017  Glucose 65 - 99 mg/dL 119(H) 181(H) 126(H)  BUN 8 - 27 mg/dL 21 17 20   Creatinine 0.76 - 1.27 mg/dL 1.71(H) 1.70(H) 1.48(H)  Sodium 134 - 144 mmol/L 139 139 139  Potassium 3.5 - 5.2 mmol/L 5.5(H) 5.5(H) 5.5(H)  Chloride 96 - 106 mmol/L 102 96 96  CO2 20 - 29 mmol/L 23 27 27   Calcium 8.6 - 10.2 mg/dL 8.8 8.7 9.1  Total Protein 6.0 - 8.5 g/dL 7.0 6.9 7.3  Total Bilirubin 0.0 - 1.2 mg/dL 0.2 0.2 0.3  Alkaline Phos 39 - 117 IU/L 93 102 95  AST 0 - 40 IU/L 15 13 16   ALT 0 - 44 IU/L 11 10 11     RADIOGRAPHIC STUDIES: No results found.  ASSESSMENT: [name] 83 y.o. male with a history of  1. Autoimmune neutropenia. - He continues to do well overall.  His WBC and ANC has remained normal since he is off Neupogen and cyclosporine in 2016. -Continue monitoring. Given his stable and normal blood counts and routine follow up with Dr Tamala Julian, I recommend him only to follow up as needed with [place] if. He can return if his counts trend abnormally again.   2. 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 Verl Blalock 12/22/2011 without significant finding.  -continue follow up, CEA normal in 03/2015 -No evidence of recurrence. He is 5 years out of his initial diagnosis now,  minimum risk of recurrence, no need routine follow up or surveillance scan   4. Anemia.  - Likely related to mild renal insufficiency. Stable. -worse anemia in 11/2015 secondary to hip fracture and surgery, recovered very well  -I encouraged him to continue drinking adequate amounts of fluid as he is able to help with his creatinine.   5. CKD stage III, dementia, HTN, DM, CAD  -Follow up with primary care physician   PLAN:   -I will see him as needed in the future, he/his daughter knows to call me if needed -he will f/u with PCP Dr. Tamala Julian and monitor CBC   All questions were answered. The patient knows to call the clinic with any problems, questions or concerns. We can certainly see the patient much sooner if necessary.  I spent 10 minutes counseling the patient face to face. The total time spent in the appointment was 15 minutes.  This document serves as a record of services personally performed by Truitt Merle, MD. It was created on her behalf by Reola Mosher, a trained medical scribe. The creation of this record is based on  the scribe's personal observations and the provider's statements to them. This document has been checked and approved by the attending provider.  Truitt Merle, MD 10/13/2017

## 2017-10-13 ENCOUNTER — Other Ambulatory Visit (HOSPITAL_BASED_OUTPATIENT_CLINIC_OR_DEPARTMENT_OTHER): Payer: Medicare Other

## 2017-10-13 ENCOUNTER — Ambulatory Visit (HOSPITAL_BASED_OUTPATIENT_CLINIC_OR_DEPARTMENT_OTHER): Payer: Medicare Other | Admitting: Hematology

## 2017-10-13 ENCOUNTER — Encounter: Payer: Self-pay | Admitting: Hematology

## 2017-10-13 VITALS — BP 149/56 | HR 66 | Temp 97.8°F | Resp 20 | Ht 73.0 in | Wt 222.4 lb

## 2017-10-13 DIAGNOSIS — C187 Malignant neoplasm of sigmoid colon: Secondary | ICD-10-CM

## 2017-10-13 DIAGNOSIS — Z85038 Personal history of other malignant neoplasm of large intestine: Secondary | ICD-10-CM | POA: Diagnosis not present

## 2017-10-13 DIAGNOSIS — D708 Other neutropenia: Secondary | ICD-10-CM

## 2017-10-13 DIAGNOSIS — F0390 Unspecified dementia without behavioral disturbance: Secondary | ICD-10-CM | POA: Diagnosis not present

## 2017-10-13 DIAGNOSIS — D649 Anemia, unspecified: Secondary | ICD-10-CM | POA: Diagnosis not present

## 2017-10-13 DIAGNOSIS — N183 Chronic kidney disease, stage 3 (moderate): Secondary | ICD-10-CM | POA: Diagnosis not present

## 2017-10-13 DIAGNOSIS — E119 Type 2 diabetes mellitus without complications: Secondary | ICD-10-CM | POA: Diagnosis not present

## 2017-10-13 DIAGNOSIS — I10 Essential (primary) hypertension: Secondary | ICD-10-CM

## 2017-10-13 LAB — CBC WITH DIFFERENTIAL/PLATELET
BASO%: 0.4 % (ref 0.0–2.0)
BASOS ABS: 0 10*3/uL (ref 0.0–0.1)
EOS%: 1.1 % (ref 0.0–7.0)
Eosinophils Absolute: 0.1 10*3/uL (ref 0.0–0.5)
HEMATOCRIT: 37.5 % — AB (ref 38.4–49.9)
HEMOGLOBIN: 11.7 g/dL — AB (ref 13.0–17.1)
LYMPH%: 26.2 % (ref 14.0–49.0)
MCH: 26.8 pg — AB (ref 27.2–33.4)
MCHC: 31.2 g/dL — ABNORMAL LOW (ref 32.0–36.0)
MCV: 86 fL (ref 79.3–98.0)
MONO#: 0.6 10*3/uL (ref 0.1–0.9)
MONO%: 7.4 % (ref 0.0–14.0)
NEUT#: 5.3 10*3/uL (ref 1.5–6.5)
NEUT%: 64.9 % (ref 39.0–75.0)
Platelets: 211 10*3/uL (ref 140–400)
RBC: 4.36 10*6/uL (ref 4.20–5.82)
RDW: 14.1 % (ref 11.0–14.6)
WBC: 8.2 10*3/uL (ref 4.0–10.3)
lymph#: 2.2 10*3/uL (ref 0.9–3.3)

## 2017-10-14 ENCOUNTER — Encounter: Payer: Self-pay | Admitting: Neurology

## 2017-10-14 ENCOUNTER — Ambulatory Visit (INDEPENDENT_AMBULATORY_CARE_PROVIDER_SITE_OTHER): Payer: Medicare Other | Admitting: Neurology

## 2017-10-14 VITALS — BP 128/60 | HR 88 | Ht 75.0 in | Wt 223.0 lb

## 2017-10-14 DIAGNOSIS — G40009 Localization-related (focal) (partial) idiopathic epilepsy and epileptic syndromes with seizures of localized onset, not intractable, without status epilepticus: Secondary | ICD-10-CM

## 2017-10-14 DIAGNOSIS — F0390 Unspecified dementia without behavioral disturbance: Secondary | ICD-10-CM | POA: Diagnosis not present

## 2017-10-14 DIAGNOSIS — F03A0 Unspecified dementia, mild, without behavioral disturbance, psychotic disturbance, mood disturbance, and anxiety: Secondary | ICD-10-CM

## 2017-10-14 MED ORDER — ZONISAMIDE 100 MG PO CAPS: ORAL_CAPSULE | ORAL | 3 refills | Status: DC

## 2017-10-14 NOTE — Progress Notes (Signed)
NEUROLOGY FOLLOW UP OFFICE NOTE  [name] [id number] 1932/05/01  HISTORY OF PRESENT ILLNESS: I had the pleasure of seeing [name] in follow-up in the neurology clinic on 10/14/2017.  The patient was last seen 3 months ago for seizures and dementia and is again accompanied by his daughter who helps supplement the history today. On his last visit, low dose Zonisamide 100mg  qhs was added to his Lamotrigine. He is taking Lamotrigine 400mg /day. His daughter denies any seizures since June 2018. No side effects on medications. She feels his memory seems better, things are stable, he is now able to tell a story. She was happy to report his cancer is in remission, but he did not seem as excited. He denies any headaches, dizziness, diplopia, focal numbness/tingling/weakness, no falls. She denies any staring/unresponsive episodes.   HPI 07/13/2017: This is an 83 yo RH man with a history of hypertension, hyperlipidemia, diabetes, CAD, stroke with no residual deficits, seizures, and dementia. He had been seeing neurologist Dr. Catalina [name], who has retired. Records from Dr. Catalina [name] were reviewed. [name] started having recurrent nocturnal episodes in 2010. His wife had described hearing a loud scream, extremities would be rigid and extended, with jerking lasting 3-4 minutes. He would be confused or sleepy after. He can have clusters of up to 4 at night. He was evaluated at [facility] with unremarkable MRI brain and 24-hour EEG which did not show any epileptiform activity, typical events were not captured. He was started on Topiramate in 2012. He had a repeat MRI brain with and without contrast with Dr. Catalina [name] in November 2011 which did not show any acute changes. There was a small chronic lacunar infarct in the left lateral thalamus, and chronic microvascular disease. He was switched to [place] in 2013, which caused sedation and dizziness. He was then switched to Lamotrigine in 2013 with dose uptitration over the years,  currently on 100, 100, 200mg  since January 2018. His daughter keeps a calendar of his seizures. He had 4 seizures in 2014, 3 in 2015, 4 in 2016, 1 in 2017. So far this year, he has had 2, one on January 8, and most recently on 05/31/17. The last one was "more minor," but occurred during wakefulness. He came down that morning confused, unable to remember how to use his glucometer, speech as gibberish even if he thought he was making sense. There was no focal weakness noted by daughter. He then sat down and started having a seizure, with heavy breathing, stiff with extremities extended for a second, then snoring like he was drugged soon after. He wet his pants and bit his tongue. By noon, he was speaking normally. The last daytime seizure was around 3 years ago. Otherwise all his seizures have been nocturnal between 4-7am. He lives with his daughter, who notices that a few days before a seizure, he would seem to be craving food more, grazing around, when usually he is a picky eater. She reports that 3-5 days before a seizure, "he gets more mean." Seizure triggers that she has noticed are beer, sweets, and after trauma (he had a seizure after he broke his shoulder, then after he fractured his hip). He denies any olfactory/gustatory hallucinations, deja vu, rising epigastric sensation, focal numbness/tingling/weakness, myoclonic jerks.  He has also been having cognitive dysfunction since at least 2013. He is taking Aricept 10mg  daily without side effects. Overall his daughter feels his memory has been stable. His daughter has always been in charge of bills and medications,  stating "he doesn't do anything for himself." He does not drive. His mother and sister were diagnosed with Alzheimer's disease. 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 Otherwise he had a normal birth and early development.  There is no history of febrile convulsions, CNS infections such as meningitis/encephalitis, significant  traumatic brain injury, neurosurgical procedures, or family history of seizures.  Prior AEDs: Keppra, Topamax EEGs: EEG done 05/2012 was abnormal due to mild diffuse slowing MRI brain as above  PAST MEDICAL HISTORY: Past Medical History:  Diagnosis Date  . Anemia   . Aortic root dilatation ([place])   . BPH (benign prostatic hyperplasia)    increased PSA  . CAD (coronary artery disease)    Presumed from previously mildy abnormal myoview;  myoview 4/12: EF 73%, no ischemia  . Cardiomyopathy    a. echo 5/12: EF 45-50%, mild LVH, grade 2 diast dysfxn, RAE  . Chronic kidney disease    renal insuff, increased uric acid  . Colon cancer ([place])   . Diabetes mellitus   . DM2 (diabetes mellitus, type 2) ([place])   . HLD (hyperlipidemia)   . HTN (hypertension)   . Leukopenia   . Pneumothorax   . Rectal cancer ([place])   . Seizures ([place])   . Stroke [facility])     MEDICATIONS: Current Outpatient Prescriptions on File Prior to Visit  Medication Sig Dispense Refill  . acetaminophen (TYLENOL) 325 MG tablet Take 650 mg by mouth every 4 (four) hours as needed. Reported on 04/12/2016    . amLODipine (NORVASC) 5 MG tablet Take 1 tablet (5 mg total) by mouth daily. 30 tablet 11  . aspirin 81 MG tablet Take 81 mg by mouth daily.    Marland Kitchen donepezil (ARICEPT) 10 MG tablet Take 1 tablet (10 mg total) by mouth daily. 90 tablet 3  . doxazosin (CARDURA) 4 MG tablet TAKE 1 TABLET BY MOUTH AT BEDTIME. 90 tablet 3  . ECHINACEA HERB PO Take 1 tablet by mouth daily.    . finasteride (PROSCAR) 5 MG tablet Take 1 tablet (5 mg total) by mouth daily. 90 tablet 3  . furosemide (LASIX) 20 MG tablet Take 1 tablet (20 mg total) by mouth daily. 90 tablet 3  . glucose blood (ONE TOUCH ULTRA TEST) test strip Use to test blood sugar 3 times daily. Dx E11.22 300 each 3  . insulin glargine (LANTUS) 100 UNIT/ML injection Inject 0.08 mLs (8 Units total) into the skin at bedtime. 10 mL 11  . insulin regular (HUMULIN R) 250 units/2.27mL (100  units/mL) injection Inject 0.1-0.15 mLs (10-15 Units total) into the skin 2 (two) times daily before a meal. 10 mL 11  . INSULIN SYRINGE .5CC/28G (B-D INS SYR MICROFINE .5CC/28G) 28G X 1/2" 0.5 ML MISC Check sugar tid dx: DMII controlled insulin dependent 100 each 11  . lamoTRIgine (LAMICTAL) 100 MG tablet Take 1 tab in AM, 1 tab at noon, 2 tabs at night 360 tablet 3  . metoprolol succinate (TOPROL-XL) 50 MG 24 hr tablet Take 1 tablet (50 mg total) by mouth daily. TAKE 1 TABLET BY MOUTH EVERY DAY. 90 tablet 3  . Misc. Devices MISC Stair Lift.  Use as directed.  DX Code: Z12.458K 1 each 0  . nitroGLYCERIN (NITROSTAT) 0.4 MG SL tablet Place 1 tablet (0.4 mg total) under the tongue every 5 (five) minutes as needed for chest pain. 25 tablet 3  . Nutritional Supplements (JUICE PLUS FIBRE PO) Take 2 tablets by mouth 2 (two)  times daily. Reported on 04/12/2016    . pravastatin (PRAVACHOL) 20 MG tablet Take 1 tablet (20 mg total) by mouth daily. 90 tablet 3  . zonisamide (ZONEGRAN) 100 MG capsule Take 1 capsule every night 30 capsule 6   No current facility-administered medications on file prior to visit.     ALLERGIES: No Known Allergies  FAMILY HISTORY: Family History  Problem Relation Age of Onset  . Parkinsonism Father   . Alzheimer's disease Mother   . Alzheimer's disease Sister     SOCIAL HISTORY: Social History   Social History  . Marital status: Widowed    Spouse name: N/A  . Number of children: 2  . Years of education: N/A   Occupational History  . retired    Social History Main Topics  . Smoking status: Never Smoker  . Smokeless tobacco: Never Used  . Alcohol use Yes  . Drug use: No  . Sexual activity: Not Currently   Other Topics Concern  . Not on file   Social History Narrative   Marital status: widowed since 2014; married x 56 years.  Not dating..      Children:  2 children; 2 grandchildren; no gg      Lives; with daughter; pt has babysitter five days per week for  six hours. Five dogs; expecting puppies.      Employed: retired; Administrator; trucking Administrator      Alcohol: none now      Exercise:  None      ADLs: cane for ambulation; no driving in [address]; no cooking; daughter does grocery shopping; daughter does finances.     Advanced Directives:  Not sure if has a living will.  FULL CODE; no prolonged measures.HCPOA: [name]; has a son.     REVIEW OF SYSTEMS: Constitutional: No fevers, chills, or sweats, no generalized fatigue, change in appetite Eyes: No visual changes, double vision, eye pain Ear, nose and throat: No hearing loss, ear pain, nasal congestion, sore throat Cardiovascular: No chest pain, palpitations Respiratory:  No shortness of breath at rest or with exertion, wheezes GastrointestinaI: No nausea, vomiting, diarrhea, abdominal pain, fecal incontinence Genitourinary:  No dysuria, urinary retention or frequency Musculoskeletal:  + neck pain, back pain Integumentary: No rash, pruritus, skin lesions Neurological: as above Psychiatric: No depression, insomnia, anxiety Endocrine: No palpitations, fatigue, diaphoresis, mood swings, change in appetite, change in weight, increased thirst Hematologic/Lymphatic:  No anemia, purpura, petechiae. Allergic/Immunologic: no itchy/runny eyes, nasal congestion, recent allergic reactions, rashes  PHYSICAL EXAM: Vitals:   10/14/17 1348  BP: 128/60  Pulse: 88  SpO2: 94%   General: No acute distress Head:  Normocephalic/atraumatic Neck: supple, no paraspinal tenderness, full range of motion Heart:  Regular rate and rhythm Lungs:  Clear to auscultation bilaterally Back: No paraspinal tenderness Skin/Extremities: No rash, no edema Neurological Exam: alert and oriented to person, place, and time. No aphasia or dysarthria. Fund of knowledge is appropriate.  Recent and remote memory are impaired.  Attention and concentration are normal.    Able to name objects and repeat phrases.  Cranial nerves: Pupils equal, round, reactive to light. Extraocular movements intact with no nystagmus. Visual fields full. Facial sensation intact. No facial asymmetry. Tongue, uvula, palate midline.  Motor: Bulk and tone normal, muscle strength 5/5 throughout with no pronator drift.  Sensation to light touch intact.  No extinction to double simultaneous stimulation.  Deep tendon reflexes +1 throughout, toes downgoing.  Finger to nose testing  intact.  Gait slow and cautious with cane.  Romberg negative.  IMPRESSION: This is an 83 yo RH man with a history of hypertension, hyperlipidemia, diabetes, CAD, stroke with no residual deficits, seizures, and dementia. Seizures are predominantly nocturnal, but he has had 2 seizures in wakefulness, most recently last 05/31/17. 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 He was on Lamotrigine 400mg /day with continued seizures, low dose Zonisamide 100mg  qhs was added, no side effects. We may uptitrate if seizures recur. His daughter reports memory is stable, a little better even. Continue Aricept 10mg  daily. He is aware of [place] driving laws to stop driving after a seizure, until 6 months seizure-free. He does not drive. He will follow-up in 6 months and knows to call for any changes  Thank you for allowing me to participate in his care.  Please do not hesitate to call for any questions or concerns.  The duration of this appointment visit was 15 minutes of face-to-face time with the patient.  Greater than 50% of this time was spent in counseling, explanation of diagnosis, planning of further management, and coordination of care.   Ellouise Newer, M.D.   CC: Dr. Tamala Julian

## 2017-10-14 NOTE — Patient Instructions (Signed)
1. Continue all your medications 2. Follow-up in 6 months, call for any changes  Seizure Precautions: 1. If medication has been prescribed for you to prevent seizures, take it exactly as directed.  Do not stop taking the medicine without talking to your doctor first, even if you have not had a seizure in a long time.   2. Avoid activities in which a seizure would cause danger to yourself or to others.  Don't operate dangerous machinery, swim alone, or climb in high or dangerous places, such as on ladders, roofs, or girders.  Do not drive unless your doctor says you may.  3. If you have any warning that you may have a seizure, lay down in a safe place where you can't hurt yourself.    4.  No driving for 6 months from last seizure, as per [place] state law.   Please refer to the following link on the Epilepsy Foundation of America's website for more information: http://www.epilepsyfoundation.org/answerplace/Social/driving/drivingu.cfm   5.  Maintain good sleep hygiene. Avoid alcohol.  6.  Contact your doctor if you have any problems that may be related to the medicine you are taking.  7.  Call 911 and bring the patient back to the ED if:        A.  The seizure lasts longer than 5 minutes.       B.  The patient doesn't awaken shortly after the seizure  C.  The patient has new problems such as difficulty seeing, speaking or moving  D.  The patient was injured during the seizure  E.  The patient has a temperature over 102 F (39C)  F.  The patient vomited and now is having trouble breathing         

## 2017-10-18 ENCOUNTER — Encounter: Payer: Self-pay | Admitting: Neurology

## 2017-10-20 ENCOUNTER — Telehealth: Payer: Self-pay | Admitting: Hematology

## 2017-10-20 NOTE — Telephone Encounter (Signed)
Per 11/1 los - F/u as needed

## 2017-11-07 ENCOUNTER — Encounter (INDEPENDENT_AMBULATORY_CARE_PROVIDER_SITE_OTHER): Payer: Medicare Other | Admitting: Podiatry

## 2017-11-07 ENCOUNTER — Ambulatory Visit: Payer: Medicare Other | Admitting: Podiatry

## 2017-11-07 NOTE — Progress Notes (Signed)
This encounter was created in error - please disregard.

## 2017-11-16 ENCOUNTER — Other Ambulatory Visit: Payer: Self-pay | Admitting: Physician Assistant

## 2017-11-21 ENCOUNTER — Ambulatory Visit: Payer: Medicare Other | Admitting: Podiatry

## 2017-12-16 ENCOUNTER — Telehealth: Payer: Self-pay | Admitting: Family Medicine

## 2017-12-16 ENCOUNTER — Other Ambulatory Visit: Payer: Self-pay | Admitting: *Deleted

## 2017-12-16 MED ORDER — AMLODIPINE BESYLATE 5 MG PO TABS: 5.0000 mg | ORAL_TABLET | Freq: Every day | ORAL | 0 refills | Status: DC

## 2017-12-16 NOTE — Telephone Encounter (Signed)
Copied from [place] [id number]. Topic: Quick Communication - Rx Refill/Question >> Dec 16, 2017  9:23 AM Yvette Rack wrote: Has the patient contacted their pharmacy? Yes.     (Agent: If no, request that the patient contact the pharmacy for the refill.)amLODipine ([place]) 5 MG tablet     Preferred Pharmacy (with phone number or street name): [id number] - [place], [place] - [address] [phone] (Phone) [phone] (Fax     Agent: Please be advised that RX refills may take up to 3 business days. We ask that you follow-up with your pharmacy.

## 2017-12-26 ENCOUNTER — Other Ambulatory Visit: Payer: Self-pay | Admitting: Family Medicine

## 2017-12-26 MED ORDER — AMLODIPINE BESYLATE 5 MG PO TABS: 5.0000 mg | ORAL_TABLET | Freq: Every day | ORAL | 3 refills | Status: DC

## 2017-12-28 ENCOUNTER — Encounter: Payer: Medicare Other | Admitting: Family Medicine

## 2017-12-28 ENCOUNTER — Ambulatory Visit: Payer: Medicare Other

## 2018-01-11 ENCOUNTER — Other Ambulatory Visit: Payer: Self-pay | Admitting: Family Medicine

## 2018-01-16 ENCOUNTER — Other Ambulatory Visit: Payer: Self-pay | Admitting: Family Medicine

## 2018-01-18 ENCOUNTER — Ambulatory Visit (INDEPENDENT_AMBULATORY_CARE_PROVIDER_SITE_OTHER): Payer: Medicare Other | Admitting: Family Medicine

## 2018-01-18 ENCOUNTER — Ambulatory Visit: Payer: Medicare Other

## 2018-01-18 ENCOUNTER — Encounter: Payer: Self-pay | Admitting: Family Medicine

## 2018-01-18 ENCOUNTER — Other Ambulatory Visit: Payer: Self-pay

## 2018-01-18 VITALS — BP 130/78 | HR 62 | Temp 98.0°F | Resp 16 | Ht 74.02 in | Wt 221.0 lb

## 2018-01-18 DIAGNOSIS — I10 Essential (primary) hypertension: Secondary | ICD-10-CM | POA: Diagnosis not present

## 2018-01-18 DIAGNOSIS — E1122 Type 2 diabetes mellitus with diabetic chronic kidney disease: Secondary | ICD-10-CM | POA: Diagnosis not present

## 2018-01-18 DIAGNOSIS — Z794 Long term (current) use of insulin: Secondary | ICD-10-CM

## 2018-01-18 DIAGNOSIS — E7800 Pure hypercholesterolemia, unspecified: Secondary | ICD-10-CM | POA: Diagnosis not present

## 2018-01-18 DIAGNOSIS — N183 Chronic kidney disease, stage 3 unspecified: Secondary | ICD-10-CM

## 2018-01-18 DIAGNOSIS — Z0000 Encounter for general adult medical examination without abnormal findings: Secondary | ICD-10-CM

## 2018-01-18 DIAGNOSIS — G40009 Localization-related (focal) (partial) idiopathic epilepsy and epileptic syndromes with seizures of localized onset, not intractable, without status epilepticus: Secondary | ICD-10-CM

## 2018-01-18 DIAGNOSIS — Z23 Encounter for immunization: Secondary | ICD-10-CM | POA: Diagnosis not present

## 2018-01-18 DIAGNOSIS — Z85048 Personal history of other malignant neoplasm of rectum, rectosigmoid junction, and anus: Secondary | ICD-10-CM

## 2018-01-18 DIAGNOSIS — I255 Ischemic cardiomyopathy: Secondary | ICD-10-CM

## 2018-01-18 DIAGNOSIS — Z9049 Acquired absence of other specified parts of digestive tract: Secondary | ICD-10-CM

## 2018-01-18 DIAGNOSIS — I2510 Atherosclerotic heart disease of native coronary artery without angina pectoris: Secondary | ICD-10-CM

## 2018-01-18 LAB — POCT URINALYSIS DIP (MANUAL ENTRY)
BILIRUBIN UA: NEGATIVE
BILIRUBIN UA: NEGATIVE mg/dL
GLUCOSE UA: NEGATIVE mg/dL
Leukocytes, UA: NEGATIVE
Nitrite, UA: NEGATIVE
Protein Ur, POC: NEGATIVE mg/dL
RBC UA: NEGATIVE
SPEC GRAV UA: 1.015 (ref 1.010–1.025)
UROBILINOGEN UA: 0.2 U/dL
pH, UA: 7 (ref 5.0–8.0)

## 2018-01-18 LAB — POCT GLYCOSYLATED HEMOGLOBIN (HGB A1C): Hemoglobin A1C: 7.5

## 2018-01-18 MED ORDER — INSULIN GLARGINE 100 UNIT/ML ~~LOC~~ SOLN
12.0000 [IU] | Freq: Every day | SUBCUTANEOUS | 11 refills | Status: DC
Start: 2018-01-18 — End: 2018-11-28

## 2018-01-18 MED ORDER — DICLOFENAC SODIUM 1 % TD GEL: 4.0000 g | Freq: Four times a day (QID) | TRANSDERMAL | 0 refills | Status: DC

## 2018-01-18 MED ORDER — ZOSTER VAC RECOMB ADJUVANTED 50 MCG/0.5ML IM SUSR: 0.5000 mL | Freq: Once | INTRAMUSCULAR | 1 refills | Status: AC

## 2018-01-18 NOTE — Progress Notes (Signed)
Subjective:    Patient ID: [name], male    DOB: Jan 21, 1932, 84 y.o.   MRN: [id number]  01/18/2018  Annual Exam    HPI This 84 y.o. male presents for Complete Physical examination and follow-up of chronic medical conditions.  Last physical:  01-11-2017 Colonoscopy:  2013 PSA:  2011 Eye exam:  06-2017   Visual Acuity Screening   Right eye Left eye Both eyes  Without correction:     With correction: 20/40 20/100 20/30    BP Readings from Last 3 Encounters:  01/18/18 130/78  10/14/17 128/60  10/13/17 (!) 149/56   Wt Readings from Last 3 Encounters:  01/18/18 221 lb (100.2 kg)  10/14/17 223 lb (101.2 kg)  10/13/17 222 lb 6.4 oz (100.9 kg)   Immunization History  Administered Date(s) Administered  . Influenza Split 10/31/2012  . Influenza,inj,Quad PF,6+ Mos 10/23/2013, 08/14/2014, 08/14/2015, 08/23/2017  . Influenza-Unspecified 10/01/2016  . Pneumococcal Conjugate-13 07/02/2014  . Pneumococcal Polysaccharide-23 12/10/2003, 06/08/2016  . Tdap 05/20/2015   Health Maintenance  Topic Date Due  . COLONOSCOPY  12/21/2014  . FOOT EXAM  04/12/2018  . OPHTHALMOLOGY EXAM  06/29/2018  . HEMOGLOBIN A1C  07/18/2018  . TETANUS/TDAP  05/19/2025  . INFLUENZA VACCINE  Completed  . PNA vac Low Risk Adult  Completed    DMII: Patient reports good compliance with medication, good tolerance to medication, and good symptom control. Lantus 8 units qhs; before breakfast 8units; 12 units before supper.    Hypertension: Patient reports good compliance with medication, good tolerance to medication, and good symptom control.    Hypercholesterolemia: Patient reports good compliance with medication, good tolerance to medication, and good symptom control.    R knee pain: no injury; no swelling. No medications.  No nighttime awakening.   Review of Systems  Constitutional: Negative for activity change, appetite change, chills, diaphoresis, fatigue, fever and unexpected weight change.    HENT: Negative for congestion, dental problem, drooling, ear discharge, ear pain, facial swelling, hearing loss, mouth sores, nosebleeds, postnasal drip, rhinorrhea, sinus pressure, sneezing, sore throat, tinnitus, trouble swallowing and voice change.   Eyes: Negative for photophobia, pain, discharge, redness, itching and visual disturbance.  Respiratory: Negative for apnea, cough, choking, chest tightness, shortness of breath, wheezing and stridor.   Cardiovascular: Negative for chest pain, palpitations and leg swelling.  Gastrointestinal: Negative for abdominal pain, blood in stool, constipation, diarrhea, nausea and vomiting.  Endocrine: Negative for cold intolerance, heat intolerance, polydipsia, polyphagia and polyuria.  Genitourinary: Negative for decreased urine volume, difficulty urinating, discharge, dysuria, enuresis, flank pain, frequency, genital sores, hematuria, penile pain, penile swelling, scrotal swelling, testicular pain and urgency.  Musculoskeletal: Positive for arthralgias. Negative for back pain, gait problem, joint swelling, myalgias, neck pain and neck stiffness.  Skin: Negative for color change, pallor, rash and wound.  Allergic/Immunologic: Negative for environmental allergies, food allergies and immunocompromised state.  Neurological: Negative for dizziness, tremors, seizures, syncope, facial asymmetry, speech difficulty, weakness, light-headedness, numbness and headaches.  Hematological: Negative for adenopathy. Does not bruise/bleed easily.  Psychiatric/Behavioral: Negative for agitation, behavioral problems, confusion, decreased concentration, dysphoric mood, hallucinations, self-injury, sleep disturbance and suicidal ideas. The patient is not nervous/anxious and is not hyperactive.     Past Medical History:  Diagnosis Date  . Anemia   . Aortic root dilatation ([place])   . BPH (benign prostatic hyperplasia)    increased PSA  . CAD (coronary artery disease)     Presumed from previously mildy abnormal myoview;  myoview 4/12:  EF 73%, no ischemia  . Cardiomyopathy    a. echo 5/12: EF 45-50%, mild LVH, grade 2 diast dysfxn, RAE  . Chronic kidney disease    renal insuff, increased uric acid  . Colon cancer ([place])   . Diabetes mellitus   . DM2 (diabetes mellitus, type 2) ([place])   . HLD (hyperlipidemia)   . HTN (hypertension)   . Leukopenia   . Pneumothorax   . Rectal cancer ([place])   . Seizures ([place])   . Stroke [facility])    Past Surgical History:  Procedure Laterality Date  . CATARACT EXTRACTION, BILATERAL    . COLON SURGERY    . FEMUR IM NAIL Right 11/16/2015   Procedure: INTRAMEDULLARY TROCHANTERIC HIP NAILING;  Surgeon: Rod Can, MD;  Location: [facility];  Service: Orthopedics;  Laterality: Right;  . HIP ARTHROPLASTY    . ILEOSTOMY  12/2010  . JOINT REPLACEMENT    . reversal ileostomy  03/2011  . S/P resection of rectal cancer    . TONSILLECTOMY    . VASECTOMY     No Known Allergies Current Outpatient Medications on File Prior to Visit  Medication Sig Dispense Refill  . acetaminophen (TYLENOL) 325 MG tablet Take 650 mg by mouth every 4 (four) hours as needed. Reported on 04/12/2016    . amLODipine (NORVASC) 5 MG tablet Take 1 tablet (5 mg total) by mouth daily. 90 tablet 3  . aspirin 81 MG tablet Take 81 mg by mouth daily.    . benazepril (LOTENSIN) 5 MG tablet Take by mouth.    . donepezil (ARICEPT) 10 MG tablet Take 1 tablet (10 mg total) by mouth daily. 90 tablet 3  . doxazosin (CARDURA) 4 MG tablet TAKE 1 TABLET BY MOUTH AT BEDTIME. 90 tablet 3  . ECHINACEA HERB PO Take 1 tablet by mouth daily.    . finasteride (PROSCAR) 5 MG tablet Take 1 tablet (5 mg total) by mouth daily. 90 tablet 3  . glucose blood (ONE TOUCH ULTRA TEST) test strip Use to test blood sugar 3 times daily. Dx E11.22 300 each 3  . HUMULIN R 100 UNIT/ML injection INJECT 0.1-0.15 MLS (10-15 UNITS TOTAL) INTO THE SKIN 2 (TWO) TIMES DAILY BEFORE A MEAL. 10 mL 0  .  hydrochlorothiazide (HYDRODIURIL) 25 MG tablet Take by mouth.    . INSULIN SYRINGE .5CC/28G (B-D INS SYR MICROFINE .5CC/28G) 28G X 1/2" 0.5 ML MISC Check sugar tid dx: DMII controlled insulin dependent 100 each 11  . lamoTRIgine (LAMICTAL) 100 MG tablet Take 1 tab in AM, 1 tab at noon, 2 tabs at night 360 tablet 3  . metoprolol succinate (TOPROL-XL) 50 MG 24 hr tablet TAKE 1 TABLET (50 MG TOTAL) BY MOUTH DAILY. TAKE 1 TABLET BY MOUTH EVERY DAY. 90 tablet 3  . nitroGLYCERIN (NITROSTAT) 0.4 MG SL tablet Place 1 tablet (0.4 mg total) under the tongue every 5 (five) minutes as needed for chest pain. 25 tablet 3  . Nutritional Supplements (JUICE PLUS FIBRE PO) Take 2 tablets by mouth 2 (two) times daily. Reported on 04/12/2016    . pravastatin (PRAVACHOL) 20 MG tablet Take 1 tablet (20 mg total) by mouth daily. 90 tablet 3  . zonisamide (ZONEGRAN) 100 MG capsule Take 1 capsule every night 90 capsule 3   No current facility-administered medications on file prior to visit.    Social History   Socioeconomic History  . Marital status: Widowed    Spouse name: Not on file  . Number of children:  2  . Years of education: Not on file  . Highest education level: Not on file  Social Needs  . Financial resource strain: Not on file  . Food insecurity - worry: Not on file  . Food insecurity - inability: Not on file  . Transportation needs - medical: Not on file  . Transportation needs - non-medical: Not on file  Occupational History  . Occupation: retired  Tobacco Use  . Smoking status: Never Smoker  . Smokeless tobacco: Never Used  Substance and Sexual Activity  . Alcohol use: Yes  . Drug use: No  . Sexual activity: Not Currently    Birth control/protection: None  Other Topics Concern  . Not on file  Social History Narrative   Marital status: widowed since 2014; married x 56 years.  Not dating in 2019.      Children:  2 children; 2 grandchildren; no gg      Lives; with daughter; pt has babysitter  five days per week for six hours/day. ssix dogs.      Employed: retired; Administrator; trucking Administrator      Alcohol: none now      Exercise:  None      ADLs: cane for ambulation; no driving since 9381; no cooking; daughter does grocery shopping; daughter does finances.     Advanced Directives:  Not sure if has a living will.  FULL CODE; no prolonged measures.HCPOA: [name]; has a son.    Family History  Problem Relation Age of Onset  . Parkinsonism Father   . Alzheimer's disease Mother   . Alzheimer's disease Sister        Objective:    BP 130/78   Pulse 62   Temp 98 F (36.7 C) (Oral)   Resp 16   Ht 6' 2.02" (1.88 m)   Wt 221 lb (100.2 kg)   SpO2 98%   BMI 28.36 kg/m  Physical Exam  Constitutional: He is oriented to person, place, and time. He appears well-developed and well-nourished. No distress.  HENT:  Head: Normocephalic and atraumatic.  Right Ear: External ear normal.  Left Ear: External ear normal.  Nose: Nose normal.  Mouth/Throat: Oropharynx is clear and moist.  Eyes: Conjunctivae and EOM are normal. Pupils are equal, round, and reactive to light.  Neck: Normal range of motion. Neck supple. Carotid bruit is not present. No thyromegaly present.  Cardiovascular: Normal rate, regular rhythm, normal heart sounds and intact distal pulses. Exam reveals no gallop and no friction rub.  No murmur heard. Pulmonary/Chest: Effort normal and breath sounds normal. He has no wheezes. He has no rales.  Abdominal: Soft. Bowel sounds are normal. He exhibits no distension and no mass. There is no tenderness. There is no rebound and no guarding.  Genitourinary: Penis normal.  Musculoskeletal:       Right shoulder: Normal.       Left shoulder: Normal.       Right knee: He exhibits normal range of motion and no swelling. No tenderness found. No medial joint line and no lateral joint line tenderness noted.       Cervical back: Normal.  Lymphadenopathy:    He has  no cervical adenopathy.  Neurological: He is alert and oriented to person, place, and time. He has normal reflexes. No cranial nerve deficit. He exhibits normal muscle tone. Coordination normal.  Skin: Skin is warm and dry. No rash noted. He is not diaphoretic.  Psychiatric: He has  a normal mood and affect. His behavior is normal. Judgment and thought content normal.   No results found. Depression screen [facility] 2/9 01/18/2018 08/23/2017 01/11/2017 06/08/2016 06/02/2016  Decreased Interest 0 0 0 0 0  Down, Depressed, Hopeless 0 0 0 0 0  PHQ - 2 Score 0 0 0 0 0  Some recent data might be hidden   Fall Risk  01/18/2018 10/14/2017 08/23/2017 07/13/2017 01/11/2017  Falls in the past year? No No No No No  Comment - - - - -  Number falls in past yr: - - - - -  Injury with Fall? - - - - -  Comment - - - - -  Risk Factor Category  - - - - -  Risk for fall due to : - - - - -  Risk for fall due to: Comment - - - - -    Functional Status Survey: Is the patient deaf or have difficulty hearing?: No Does the patient have difficulty seeing, even when wearing glasses/contacts?: No Does the patient have difficulty concentrating, remembering, or making decisions?: Yes Does the patient have difficulty walking or climbing stairs?: Yes Does the patient have difficulty dressing or bathing?: No Does the patient have difficulty doing errands alone such as visiting a doctor's office or shopping?: Yes     Assessment & Plan:   1. Routine physical examination   2. Essential hypertension   3. Type 2 diabetes mellitus with stage 3 chronic kidney disease, with long-term current use of insulin (HCC)   4. Stage 3 chronic kidney disease ([place])   5. Pure hypercholesterolemia   6. Need for shingles vaccine   7. Atherosclerosis of native coronary artery of native heart without angina pectoris   8. Personal history of malignant neoplasm of rectum, rectosigmoid junction, and anus   9. Localization-related idiopathic epilepsy and  epileptic syndromes with seizures of localized onset, not intractable, without status epilepticus ([place])   10. S/P left colectomy   11. Ischemic cardiomyopathy     -anticipatory guidance provided --- exercise, weight loss, safe driving practices, aspirin 81mg  daily. -obtain age appropriate screening labs and labs for chronic disease management. -moderate fall risk; no evidence of depression; no evidence of hearing loss.  Discussed advanced directives and living will; also discussed end of life issues including code status.  Hypertension: Well controlled.  Obtain labs for chronic disease management.  No changes in treatment. Hypercholesterolemia: Well controlled.  Obtain labs for chronic disease management.  Continue current medications. Diabetes mellitus type 2 insulin-dependent: Moderately controlled.  Increase Lantus to 12 units at bedtime.  Continue sliding scale short acting insulin.  Obtain labs for chronic disease management. Right knee pain: New onset without trauma.  Patient declined x-ray at today's visit.  Refill of Voltaren gel provided.  If worsens, obtain right knee film. Seizure disorder: Followed by neurology every 6 months.  Does not drive. Mild dementia: Followed by neurology every 6 months.  Excellent family support by daughter.  No changes in therapy. 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  Medically cleared in November 2018 from oncology.  Follow-up only as needed. History of sigmoid colon adenocarcinoma in 2012: Last colonoscopy in 2013.  Continue follow-up CEA recommended by oncology in November 2018.  Due to 5-year mark from initial diagnosis, the minimal risk of recurrence.  No routine follow-up with oncology recommended. Coronary artery disease: Last visit with cardiology in 2017.  Currently asymptomatic.  Obtain EKG for surveillance.  Orders Placed This Encounter  Procedures  .  CBC with Differential/Platelet  . Comprehensive metabolic panel    Order  Specific Question:   Has the patient fasted?    Answer:   No  . Lipid panel    Order Specific Question:   Has the patient fasted?    Answer:   No  . TSH  . Microalbumin / creatinine urine ratio  . POCT urinalysis dipstick  . POCT glycosylated hemoglobin (Hb A1C)  . EKG 12-Lead   Meds ordered this encounter  Medications  . insulin glargine (LANTUS) 100 UNIT/ML injection    Sig: Inject 0.12 mLs (12 Units total) into the skin at bedtime.    Dispense:  10 mL    Refill:  11  . diclofenac sodium (VOLTAREN) 1 % GEL    Sig: Apply 4 g topically 4 (four) times daily.    Dispense:  100 g    Refill:  0  . Zoster Vaccine Adjuvanted [facility]) injection    Sig: Inject 0.5 mLs into the muscle once for 1 dose.    Dispense:  0.5 mL    Refill:  1    Return in about 4 months (around 05/18/2018) for follow-up chronic medical conditions.   Kristi Elayne Guerin, M.D. Primary Care at [facility] [address] [place], [place]  [address] [phone] phone [phone] fax

## 2018-01-18 NOTE — Patient Instructions (Addendum)
Increase Lantus to 12 units at bedtime. Continue sliding scale before meals.    Preventive Care 84 Years and Older, Male Preventive care refers to lifestyle choices and visits with your health care provider that can promote health and wellness. What does preventive care include?  A yearly physical exam. This is also called an annual well check.  Dental exams once or twice a year.  Routine eye exams. Ask your health care provider how often you should have your eyes checked.  Personal lifestyle choices, including: ? Daily care of your teeth and gums. ? Regular physical activity. ? Eating a healthy diet. ? Avoiding tobacco and drug use. ? Limiting alcohol use. ? Practicing safe sex. ? Taking low doses of aspirin every day. ? Taking vitamin and mineral supplements as recommended by your health care provider. What happens during an annual well check? The services and screenings done by your health care provider during your annual well check will depend on your age, overall health, lifestyle risk factors, and family history of disease. Counseling Your health care provider may ask you questions about your:  Alcohol use.  Tobacco use.  Drug use.  Emotional well-being.  Home and relationship well-being.  Sexual activity.  Eating habits.  History of falls.  Memory and ability to understand (cognition).  Work and work Statistician.  Screening You may have the following tests or measurements:  Height, weight, and BMI.  Blood pressure.  Lipid and cholesterol levels. These may be checked every 5 years, or more frequently if you are over 15 years old.  Skin check.  Lung cancer screening. You may have this screening every year starting at age 58 if you have a 30-pack-year history of smoking and currently smoke or have quit within the past 15 years.  Fecal occult blood test (FOBT) of the stool. You may have this test every year starting at age 6.  Flexible  sigmoidoscopy or colonoscopy. You may have a sigmoidoscopy every 5 years or a colonoscopy every 10 years starting at age 5.  Prostate cancer screening. Recommendations will vary depending on your family history and other risks.  Hepatitis C blood test.  Hepatitis B blood test.  Sexually transmitted disease (STD) testing.  Diabetes screening. This is done by checking your blood sugar (glucose) after you have not eaten for a while (fasting). You may have this done every 1-3 years.  Abdominal aortic aneurysm (AAA) screening. You may need this if you are a current or former smoker.  Osteoporosis. You may be screened starting at age 67 if you are at high risk.  Talk with your health care provider about your test results, treatment options, and if necessary, the need for more tests. Vaccines Your health care provider may recommend certain vaccines, such as:  Influenza vaccine. This is recommended every year.  Tetanus, diphtheria, and acellular pertussis (Tdap, Td) vaccine. You may need a Td booster every 10 years.  Varicella vaccine. You may need this if you have not been vaccinated.  Zoster vaccine. You may need this after age 63.  Measles, mumps, and rubella (MMR) vaccine. You may need at least one dose of MMR if you were born in 1957 or later. You may also need a second dose.  Pneumococcal 13-valent conjugate (PCV13) vaccine. One dose is recommended after age 41.  Pneumococcal polysaccharide (PPSV23) vaccine. One dose is recommended after age 72.  Meningococcal vaccine. You may need this if you have certain conditions.  Hepatitis A vaccine. You may need  this if you have certain conditions or if you travel or work in places where you may be exposed to hepatitis A.  Hepatitis B vaccine. You may need this if you have certain conditions or if you travel or work in places where you may be exposed to hepatitis B.  Haemophilus influenzae type b (Hib) vaccine. You may need this if you  have certain risk factors.  Talk to your health care provider about which screenings and vaccines you need and how often you need them. This information is not intended to replace advice given to you by your health care provider. Make sure you discuss any questions you have with your health care provider. Document Released: 12/26/2015 Document Revised: 08/18/2016 Document Reviewed: 09/30/2015 Elsevier Interactive Patient Education  2018 [employer].   IF you received an x-ray today, you will receive an invoice from [facility]. Please contact [facility] at [phone] with questions or concerns regarding your invoice.   IF you received labwork today, you will receive an invoice from [facility]. Please contact [facility] at [phone] with questions or concerns regarding your invoice.   Our billing staff will not be able to assist you with questions regarding bills from these companies.  You will be contacted with the lab results as soon as they are available. The fastest way to get your results is to activate your My Chart account. Instructions are located on the last page of this paperwork. If you have not heard from [place] regarding the results in 2 weeks, please contact this office.

## 2018-01-19 LAB — COMPREHENSIVE METABOLIC PANEL
A/G RATIO: 1.7 (ref 1.2–2.2)
ALT: 12 IU/L (ref 0–44)
AST: 17 IU/L (ref 0–40)
Albumin: 4.5 g/dL (ref 3.5–4.7)
Alkaline Phosphatase: 95 IU/L (ref 39–117)
BILIRUBIN TOTAL: 0.2 mg/dL (ref 0.0–1.2)
BUN/Creatinine Ratio: 13 (ref 10–24)
BUN: 20 mg/dL (ref 8–27)
CALCIUM: 9 mg/dL (ref 8.6–10.2)
CHLORIDE: 101 mmol/L (ref 96–106)
CO2: 25 mmol/L (ref 20–29)
Creatinine, Ser: 1.54 mg/dL — ABNORMAL HIGH (ref 0.76–1.27)
GFR calc Af Amer: 48 mL/min/{1.73_m2} — ABNORMAL LOW (ref >59)
GFR, EST NON AFRICAN AMERICAN: 41 mL/min/{1.73_m2} — AB (ref >59)
Globulin, Total: 2.6 g/dL (ref 1.5–4.5)
Glucose: 158 mg/dL — ABNORMAL HIGH (ref 65–99)
POTASSIUM: 5 mmol/L (ref 3.5–5.2)
SODIUM: 141 mmol/L (ref 134–144)
TOTAL PROTEIN: 7.1 g/dL (ref 6.0–8.5)

## 2018-01-19 LAB — LIPID PANEL
CHOL/HDL RATIO: 3.6 ratio (ref 0.0–5.0)
Cholesterol, Total: 122 mg/dL (ref 100–199)
HDL: 34 mg/dL — AB (ref >39)
LDL Calculated: 61 mg/dL (ref 0–99)
Triglycerides: 137 mg/dL (ref 0–149)
VLDL Cholesterol Cal: 27 mg/dL (ref 5–40)

## 2018-01-19 LAB — CBC WITH DIFFERENTIAL/PLATELET
BASOS: 0 %
Basophils Absolute: 0 10*3/uL (ref 0.0–0.2)
EOS (ABSOLUTE): 0.1 10*3/uL (ref 0.0–0.4)
EOS: 1 %
HEMATOCRIT: 37.4 % — AB (ref 37.5–51.0)
Hemoglobin: 12.1 g/dL — ABNORMAL LOW (ref 13.0–17.7)
IMMATURE GRANS (ABS): 0 10*3/uL (ref 0.0–0.1)
IMMATURE GRANULOCYTES: 0 %
LYMPHS: 36 %
Lymphocytes Absolute: 3 10*3/uL (ref 0.7–3.1)
MCH: 27.1 pg (ref 26.6–33.0)
MCHC: 32.4 g/dL (ref 31.5–35.7)
MCV: 84 fL (ref 79–97)
Monocytes Absolute: 0.4 10*3/uL (ref 0.1–0.9)
Monocytes: 5 %
NEUTROS ABS: 4.8 10*3/uL (ref 1.4–7.0)
NEUTROS PCT: 58 %
Platelets: 230 10*3/uL (ref 150–379)
RBC: 4.46 x10E6/uL (ref 4.14–5.80)
RDW: 14.5 % (ref 12.3–15.4)
WBC: 8.3 10*3/uL (ref 3.4–10.8)

## 2018-01-19 LAB — MICROALBUMIN / CREATININE URINE RATIO
CREATININE, UR: 98.6 mg/dL
Microalb/Creat Ratio: 26.1 mg/g creat (ref 0.0–30.0)
Microalbumin, Urine: 25.7 ug/mL

## 2018-01-19 LAB — TSH: TSH: 2.8 u[IU]/mL (ref 0.450–4.500)

## 2018-01-30 ENCOUNTER — Other Ambulatory Visit: Payer: Self-pay | Admitting: Family Medicine

## 2018-01-30 NOTE — Telephone Encounter (Signed)
Furosemide refill Last OV: 01/18/18 Last Refill:01/11/17 [facility]acy[facility]

## 2018-02-01 ENCOUNTER — Other Ambulatory Visit: Payer: Self-pay

## 2018-02-01 ENCOUNTER — Telehealth: Payer: Self-pay | Admitting: *Deleted

## 2018-02-01 NOTE — Telephone Encounter (Signed)
PA STARTED  KEY: [id number]

## 2018-02-02 ENCOUNTER — Telehealth: Payer: Self-pay | Admitting: Family Medicine

## 2018-02-02 NOTE — Telephone Encounter (Signed)
Copied from [place] [id number]. Topic: Quick Communication - Rx Refill/Question >> Feb 02, 2018  3:25 PM Sandi Mariscal E, NT wrote: Medication: HUMULIN R 100 UNIT/ML injection   Has the patient contacted their pharmacy? yes   (Agent: If no, request that the patient contact the pharmacy for the refill.)   Preferred Pharmacy (with phone number or street name): [id number] [place] [place] - [address] [phone] (Phone) [phone] (Fax)     Agent: Please be advised that RX refills may take up to 3 business days. We ask that you follow-up with your pharmacy.

## 2018-02-02 NOTE — Telephone Encounter (Signed)
Left message for the pt to call the office back.  Prescription was written on 2/4 and wanted to know if the pt picked up the medication from this date.

## 2018-02-09 ENCOUNTER — Other Ambulatory Visit: Payer: Self-pay | Admitting: Family Medicine

## 2018-02-15 ENCOUNTER — Other Ambulatory Visit: Payer: Self-pay | Admitting: Physician Assistant

## 2018-02-16 ENCOUNTER — Telehealth: Payer: Self-pay

## 2018-02-16 NOTE — Telephone Encounter (Signed)
PA started for Diclofenac 1 % gel. Awaiting Response  Contact plan to follow up on [id number]

## 2018-02-22 ENCOUNTER — Ambulatory Visit: Payer: Medicare Other | Admitting: Podiatry

## 2018-02-22 ENCOUNTER — Encounter: Payer: Self-pay | Admitting: Podiatry

## 2018-02-22 DIAGNOSIS — E1142 Type 2 diabetes mellitus with diabetic polyneuropathy: Secondary | ICD-10-CM | POA: Diagnosis not present

## 2018-02-22 DIAGNOSIS — B351 Tinea unguium: Secondary | ICD-10-CM

## 2018-02-22 DIAGNOSIS — M79676 Pain in unspecified toe(s): Secondary | ICD-10-CM

## 2018-02-22 NOTE — Progress Notes (Addendum)
Complaint:  Visit Type: Patient returns to my office for continued preventative foot care services. Complaint: Patient states" my nails have grown long and thick and become painful to walk and wear shoes" Patient has been diagnosed with DM with neuropathy.. The patient presents for preventative foot care services. No changes to ROS  Podiatric Exam: Vascular: dorsalis pedis and posterior tibial pulses are palpable bilateral. Capillary return is immediate. Temperature gradient is WNL. Skin turgor WNL  Sensorium: Absent  Semmes Weinstein monofilament test. Normal tactile sensation bilaterally. Nail Exam: Pt has thick disfigured discolored nails with subungual debris noted bilateral entire nail hallux through fifth toenails Ulcer Exam: There is no evidence of ulcer or pre-ulcerative changes or infection. Orthopedic Exam: Muscle tone and strength are WNL. No limitations in general ROM. No crepitus or effusions noted. Hammer toes  B/L. Bony prominences are unremarkable. Skin: No Porokeratosis. No infection or ulcers  Diagnosis:  Onychomycosis, , Pain in right toe, pain in left toes,  Diabetes with neuropathy  Treatment & Plan Procedures and Treatment: Consent by patient was obtained for treatment procedures.   Debridement of mycotic and hypertrophic toenails, 1 through 5 bilateral and clearing of subungual debris. No ulceration, no infection noted.  Return Visit-Office Procedure: Patient instructed to return to the office for a follow up visit 3 months for continued evaluation and treatment.    Jaielle Dlouhy DPM 

## 2018-02-24 NOTE — Telephone Encounter (Signed)
[facility]. Diclofenac has already been approved and purchased by patient. Next refill date is March 07, 2018.  Approval #[id number] [facility]

## 2018-03-12 ENCOUNTER — Other Ambulatory Visit: Payer: Self-pay | Admitting: Family Medicine

## 2018-03-13 ENCOUNTER — Telehealth: Payer: Self-pay | Admitting: Family Medicine

## 2018-03-13 ENCOUNTER — Other Ambulatory Visit: Payer: Self-pay | Admitting: Dermatology

## 2018-03-13 NOTE — Telephone Encounter (Signed)
Copied from [place] [id number]. Topic: Quick Communication - Rx Refill/Question >> Mar 13, 2018  2:20 PM Lennox Solders wrote: Medication: amlodipine. Pt last seen dr Tamala Julian 01-18-18 Preferred Pharmacy (with phone number [facility] street name[facility]. Pt has called pharm med was denied. Pt daughter becky Hayduk is calling back

## 2018-03-14 NOTE — Telephone Encounter (Signed)
Call to pharmacy- patient does have refills- daughter notified.

## 2018-03-15 ENCOUNTER — Other Ambulatory Visit: Payer: Self-pay | Admitting: Family Medicine

## 2018-03-15 NOTE — Telephone Encounter (Signed)
Voltaren refill Last OV: 01/18/18 Last Refill:01/18/18 100 g [id number] [address]  PCP: Dr Reginia Forts

## 2018-03-29 ENCOUNTER — Other Ambulatory Visit: Payer: Self-pay | Admitting: Family Medicine

## 2018-04-04 ENCOUNTER — Other Ambulatory Visit: Payer: Self-pay | Admitting: Family Medicine

## 2018-04-04 NOTE — Telephone Encounter (Signed)
Pravastatin refill Last OV: 01/18/18 Last Refill:01/11/17 #90 3 RF [facility]acy[facility] Dr. Reginia Forts Last Lipids: 01/18/18 Last liver: 01/18/18

## 2018-04-10 ENCOUNTER — Ambulatory Visit: Payer: Self-pay | Admitting: *Deleted

## 2018-04-10 NOTE — Telephone Encounter (Signed)
Pt's daughter called with her dad having lower abd pain on the right. He has not vomit but he is having heaves. He states he feels bad. He has not been able to eat. His blood sugar this morning is 240. He denies having a fever. This pain started last Thursday. Has not taken anything for the pain. Laying down makes it better and walking and standing makes it worst. Advised to go the emergency department at the nearest hospital. Pt voiced understanding.  Will notify their provider.  Reason for Disposition . [1] SEVERE pain AND [2] age > 43  Answer Assessment - Initial Assessment Questions 1. LOCATION: "Where does it hurt?"      Lower right side abd 2. RADIATION: "Does the pain shoot anywhere else?" (e.g., chest, back)     no 3. ONSET: "When did the pain begin?" (Minutes, hours or days ago)      Thursday 4. SUDDEN: "Gradual or sudden onset?"     gradual 5. PATTERN "Does the pain come and go, or is it constant?"    - If constant: "Is it getting better, staying the same, or worsening?"      (Note: Constant means the pain never goes away completely; most serious pain is constant and it progresses)     - If intermittent: "How long does it last?" "Do you have pain now?"     (Note: Intermittent means the pain goes away completely between bouts)     constant 6. SEVERITY: "How bad is the pain?"  (e.g., Scale 1-10; mild, moderate, or severe)    - MILD (1-3): doesn't interfere with normal activities, abdomen soft and not tender to touch     - MODERATE (4-7): interferes with normal activities or awakens from sleep, tender to touch     - SEVERE (8-10): excruciating pain, doubled over, unable to do any normal activities       Pain # 4 7. RECURRENT SYMPTOM: "Have you ever had this type of abdominal pain before?" If so, ask: "When was the last time?" and "What happened that time?"      no 8. CAUSE: "What do you think is causing the abdominal pain?"     Hernia on the right side 9. RELIEVING/AGGRAVATING  FACTORS: "What makes it better or worse?" (e.g., movement, antacids, bowel movement)     Laying down makes it better and standing and walking makes it worse 10. OTHER SYMPTOMS: "Has there been any vomiting, diarrhea, constipation, or urine problems?"       Constipated, dry heaves  Protocols used: ABDOMINAL PAIN - MALE-A-AH

## 2018-04-11 NOTE — Telephone Encounter (Signed)
Agree; warrants immediate evaluation for abdominal pain and vomiting/nausea in elderly male.  Known large R inguinal/incisional hernia no previously repaired because not a good surgical candidate.

## 2018-04-13 ENCOUNTER — Other Ambulatory Visit: Payer: Self-pay | Admitting: Physician Assistant

## 2018-04-13 MED ORDER — LAMOTRIGINE 100 MG PO TABS
200.00 | ORAL_TABLET | ORAL | Status: DC
Start: 2018-04-11 — End: 2018-04-13

## 2018-04-13 MED ORDER — INSULIN GLARGINE 100 UNIT/ML ~~LOC~~ SOLN
1.00 | SUBCUTANEOUS | Status: DC
Start: 2018-04-11 — End: 2018-04-13

## 2018-04-13 MED ORDER — GENERIC EXTERNAL MEDICATION
Status: DC
Start: ? — End: 2018-04-13

## 2018-04-13 MED ORDER — HEPARIN SODIUM (PORCINE) 5000 UNIT/ML IJ SOLN
5000.00 | INTRAMUSCULAR | Status: DC
Start: 2018-04-11 — End: 2018-04-13

## 2018-04-13 MED ORDER — FUROSEMIDE 20 MG PO TABS
20.00 | ORAL_TABLET | ORAL | Status: DC
Start: 2018-04-11 — End: 2018-04-13

## 2018-04-13 MED ORDER — PRAVASTATIN SODIUM 20 MG PO TABS
20.00 | ORAL_TABLET | ORAL | Status: DC
Start: 2018-04-12 — End: 2018-04-13

## 2018-04-13 MED ORDER — AMLODIPINE BESYLATE 5 MG PO TABS
5.00 | ORAL_TABLET | ORAL | Status: DC
Start: 2018-04-12 — End: 2018-04-13

## 2018-04-13 MED ORDER — INSULIN LISPRO 100 UNIT/ML ~~LOC~~ SOLN
1.00 | SUBCUTANEOUS | Status: DC
Start: 2018-04-11 — End: 2018-04-13

## 2018-04-13 MED ORDER — NITROGLYCERIN 0.4 MG SL SUBL
.40 | SUBLINGUAL_TABLET | SUBLINGUAL | Status: DC
Start: ? — End: 2018-04-13

## 2018-04-13 MED ORDER — LISINOPRIL 10 MG PO TABS
5.00 | ORAL_TABLET | ORAL | Status: DC
Start: 2018-04-12 — End: 2018-04-13

## 2018-04-13 MED ORDER — HYDROCODONE-ACETAMINOPHEN 5-325 MG PO TABS
1.00 | ORAL_TABLET | ORAL | Status: DC
Start: ? — End: 2018-04-13

## 2018-04-13 MED ORDER — DONEPEZIL HCL 5 MG PO TABS
10.00 | ORAL_TABLET | ORAL | Status: DC
Start: 2018-04-11 — End: 2018-04-13

## 2018-04-13 MED ORDER — DOXAZOSIN MESYLATE 4 MG PO TABS
4.00 | ORAL_TABLET | ORAL | Status: DC
Start: 2018-04-11 — End: 2018-04-13

## 2018-04-13 MED ORDER — LAMOTRIGINE 25 MG PO TABS
100.00 | ORAL_TABLET | ORAL | Status: DC
Start: 2018-04-12 — End: 2018-04-13

## 2018-04-13 MED ORDER — ZONISAMIDE 100 MG PO CAPS
100.00 | ORAL_CAPSULE | ORAL | Status: DC
Start: 2018-04-11 — End: 2018-04-13

## 2018-04-13 MED ORDER — LACTATED RINGERS IV SOLN
50.00 | INTRAVENOUS | Status: DC
Start: ? — End: 2018-04-13

## 2018-04-13 MED ORDER — METOPROLOL SUCCINATE ER 50 MG PO TB24
50.00 | ORAL_TABLET | ORAL | Status: DC
Start: 2018-04-12 — End: 2018-04-13

## 2018-04-13 MED ORDER — INSULIN LISPRO 100 UNIT/ML ~~LOC~~ SOLN
1.00 | SUBCUTANEOUS | Status: DC
Start: ? — End: 2018-04-13

## 2018-04-13 MED ORDER — HYDROCHLOROTHIAZIDE 25 MG PO TABS
25.00 | ORAL_TABLET | ORAL | Status: DC
Start: 2018-04-12 — End: 2018-04-13

## 2018-04-13 MED ORDER — SODIUM CHLORIDE 0.9 % IV SOLN
10.00 | INTRAVENOUS | Status: DC
Start: ? — End: 2018-04-13

## 2018-04-13 MED ORDER — ASPIRIN EC 81 MG PO TBEC
81.00 | DELAYED_RELEASE_TABLET | ORAL | Status: DC
Start: 2018-04-12 — End: 2018-04-13

## 2018-04-13 MED ORDER — FINASTERIDE 5 MG PO TABS
5.00 | ORAL_TABLET | ORAL | Status: DC
Start: 2018-04-12 — End: 2018-04-13

## 2018-04-13 MED ORDER — HYDROMORPHONE HCL 1 MG/ML IJ SOLN
.20 | INTRAMUSCULAR | Status: DC
Start: ? — End: 2018-04-13

## 2018-04-17 ENCOUNTER — Other Ambulatory Visit: Payer: Self-pay | Admitting: Family Medicine

## 2018-04-24 ENCOUNTER — Encounter: Payer: Self-pay | Admitting: Neurology

## 2018-04-24 ENCOUNTER — Ambulatory Visit: Payer: Medicare Other | Admitting: Neurology

## 2018-04-24 VITALS — BP 128/60 | HR 68 | Ht 75.0 in | Wt 221.0 lb

## 2018-04-24 DIAGNOSIS — F0390 Unspecified dementia without behavioral disturbance: Secondary | ICD-10-CM | POA: Diagnosis not present

## 2018-04-24 DIAGNOSIS — G40009 Localization-related (focal) (partial) idiopathic epilepsy and epileptic syndromes with seizures of localized onset, not intractable, without status epilepticus: Secondary | ICD-10-CM | POA: Diagnosis not present

## 2018-04-24 DIAGNOSIS — F03A0 Unspecified dementia, mild, without behavioral disturbance, psychotic disturbance, mood disturbance, and anxiety: Secondary | ICD-10-CM

## 2018-04-24 MED ORDER — ZONISAMIDE 100 MG PO CAPS: ORAL_CAPSULE | ORAL | 3 refills | Status: DC

## 2018-04-24 MED ORDER — DONEPEZIL HCL 10 MG PO TABS: 10.0000 mg | ORAL_TABLET | Freq: Every day | ORAL | 3 refills | Status: DC

## 2018-04-24 MED ORDER — LAMOTRIGINE 100 MG PO TABS: ORAL_TABLET | ORAL | 3 refills | Status: DC

## 2018-04-24 NOTE — Progress Notes (Signed)
NEUROLOGY FOLLOW UP OFFICE NOTE  [name] [id number] December 05, 1932  HISTORY OF PRESENT ILLNESS: I had the pleasure of seeing [name] in follow-up in the neurology clinic on 04/24/2018.  The patient was last seen 6 months ago for seizures and dementia and is again accompanied by his daughter who helps supplement the history today. He continues to do well with no seizures since June 2018 after addition of low dose Zonisamide 100mg  qhs to Lamotrigine 400mg /day. No side effects on medications. He feels his memory is good, his daughter reports he is doing very well. She administers his medications. He does not drive. She denies any staring/unresponsive episodes. He denies any headaches, dizziness, diplopia, focal numbness/tingling/weakness, no falls.    HPI 07/13/2017: This is an 84 yo RH man with a history of hypertension, hyperlipidemia, diabetes, CAD, stroke with no residual deficits, seizures, and dementia. He had been seeing neurologist Dr. Catalina [name], who has retired. Records from Dr. Catalina [name] were reviewed. [name] started having recurrent nocturnal episodes in 2010. His wife had described hearing a loud scream, extremities would be rigid and extended, with jerking lasting 3-4 minutes. He would be confused or sleepy after. He can have clusters of up to 4 at night. He was evaluated at [facility] with unremarkable MRI brain and 24-hour EEG which did not show any epileptiform activity, typical events were not captured. He was started on Topiramate in 2012. He had a repeat MRI brain with and without contrast with Dr. Catalina [name] in November 2011 which did not show any acute changes. There was a small chronic lacunar infarct in the left lateral thalamus, and chronic microvascular disease. He was switched to Coffe[place] in 2013, which caused sedation and dizziness. He was then switched to Lamotrigine in 2013 with dose uptitration over the years, currently on 100, 100, 200mg  since January 2018. His daughter keeps a  calendar of his seizures. He had 4 seizures in 2014, 3 in 2015, 4 in 2016, 1 in 2017. So far this year, he has had 2, one on January 8, and most recently on 05/31/17. The last one was "more minor," but occurred during wakefulness. He came down that morning confused, unable to remember how to use his glucometer, speech as gibberish even if he thought he was making sense. There was no focal weakness noted by daughter. He then sat down and started having a seizure, with heavy breathing, stiff with extremities extended for a second, then snoring like he was drugged soon after. He wet his pants and bit his tongue. By noon, he was speaking normally. The last daytime seizure was around 3 years ago. Otherwise all his seizures have been nocturnal between 4-7am. He lives with his daughter, who notices that a few days before a seizure, he would seem to be craving food more, grazing around, when usually he is a picky eater. She reports that 3-5 days before a seizure, "he gets more mean." Seizure triggers that she has noticed are beer, sweets, and after trauma (he had a seizure after he broke his shoulder, then after he fractured his hip). He denies any olfactory/gustatory hallucinations, deja vu, rising epigastric sensation, focal numbness/tingling/weakness, myoclonic jerks.  He has also been having cognitive dysfunction since at least 2013. He is taking Aricept 10mg  daily without side effects. Overall his daughter feels his memory has been stable. His daughter has always been in charge of bills and medications, stating "he doesn't do anything for himself." He does not drive. His mother and sister were  diagnosed with Alzheimer's disease. 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 Otherwise he had a normal birth and early development.  There is no history of febrile convulsions, CNS infections such as meningitis/encephalitis, significant traumatic brain injury, neurosurgical procedures, or family history of  seizures.  Prior AEDs: Keppra, Topamax EEGs: EEG done 05/2012 was abnormal due to mild diffuse slowing MRI brain as above  PAST MEDICAL HISTORY: Past Medical History:  Diagnosis Date  . Anemia   . Aortic root dilatation ([place])   . BPH (benign prostatic hyperplasia)    increased PSA  . CAD (coronary artery disease)    Presumed from previously mildy abnormal myoview;  myoview 4/12: EF 73%, no ischemia  . Cardiomyopathy    a. echo 5/12: EF 45-50%, mild LVH, grade 2 diast dysfxn, RAE  . Chronic kidney disease    renal insuff, increased uric acid  . Colon cancer ([place])   . Diabetes mellitus   . DM2 (diabetes mellitus, type 2) ([place])   . HLD (hyperlipidemia)   . HTN (hypertension)   . Leukopenia   . Pneumothorax   . Rectal cancer ([place])   . Seizures ([place])   . Stroke [facility])     MEDICATIONS: Current Outpatient Medications on File Prior to Visit  Medication Sig Dispense Refill  . acetaminophen (TYLENOL) 325 MG tablet Take 650 mg by mouth every 4 (four) hours as needed. Reported on 04/12/2016    . amLODipine (NORVASC) 5 MG tablet Take 1 tablet (5 mg total) by mouth daily. 90 tablet 3  . aspirin 81 MG tablet Take 81 mg by mouth daily.    . benazepril (LOTENSIN) 5 MG tablet Take by mouth.    . diclofenac sodium (VOLTAREN) 1 % GEL APPLY 4 G TOPICALLY 4 (FOUR) TIMES DAILY. 100 g 0  . donepezil (ARICEPT) 10 MG tablet Take 1 tablet (10 mg total) by mouth daily. 90 tablet 3  . doxazosin (CARDURA) 4 MG tablet TAKE 1 TABLET BY MOUTH AT BEDTIME. 90 tablet 3  . ECHINACEA HERB PO Take 1 tablet by mouth daily.    . finasteride (PROSCAR) 5 MG tablet TAKE 1 TABLET (5 MG TOTAL) BY MOUTH DAILY. 90 tablet 3  . furosemide (LASIX) 20 MG tablet TAKE 1 TABLET (20 MG TOTAL) BY MOUTH DAILY. 90 tablet 1  . glucose blood (ONE TOUCH ULTRA TEST) test strip USE TO TEST BLOOD SUGAR 3 TIMES DAILY. DX E11.22 100 each 1  . HUMULIN R 100 UNIT/ML injection INJECT 0.1-0.15 MLS (10-15 UNITS TOTAL) INTO THE SKIN 2 (TWO) TIMES  DAILY BEFORE A MEAL. 10 mL 0  . hydrochlorothiazide (HYDRODIURIL) 25 MG tablet Take by mouth.    . insulin glargine (LANTUS) 100 UNIT/ML injection Inject 0.12 mLs (12 Units total) into the skin at bedtime. 10 mL 11  . INSULIN SYRINGE .5CC/28G (B-D INS SYR MICROFINE .5CC/28G) 28G X 1/2" 0.5 ML MISC Check sugar tid dx: DMII controlled insulin dependent 100 each 11  . lamoTRIgine (LAMICTAL) 100 MG tablet Take 1 tab in AM, 1 tab at noon, 2 tabs at night 360 tablet 3  . metoprolol succinate (TOPROL-XL) 50 MG 24 hr tablet TAKE 1 TABLET (50 MG TOTAL) BY MOUTH DAILY. TAKE 1 TABLET BY MOUTH EVERY DAY. 90 tablet 3  . nitroGLYCERIN (NITROSTAT) 0.4 MG SL tablet Place 1 tablet (0.4 mg total) under the tongue every 5 (five) minutes as needed for chest pain. 25 tablet 3  . Nutritional Supplements (JUICE PLUS FIBRE PO) Take 2  tablets by mouth 2 (two) times daily. Reported on 04/12/2016    . pravastatin (PRAVACHOL) 20 MG tablet TAKE 1 TABLET (20 MG TOTAL) BY MOUTH DAILY. 60 tablet 0  . zonisamide (ZONEGRAN) 100 MG capsule Take 1 capsule every night 90 capsule 3   No current facility-administered medications on file prior to visit.     ALLERGIES: No Known Allergies  FAMILY HISTORY: Family History  Problem Relation Age of Onset  . Parkinsonism Father   . Alzheimer's disease Mother   . Alzheimer's disease Sister     SOCIAL HISTORY: Social History   Socioeconomic History  . Marital status: Widowed    Spouse name: Not on file  . Number of children: 2  . Years of education: Not on file  . Highest education level: Not on file  Occupational History  . Occupation: retired  Scientific laboratory technician  . Financial resource strain: Not on file  . Food insecurity:    Worry: Not on file    Inability: Not on file  . Transportation needs:    Medical: Not on file    Non-medical: Not on file  Tobacco Use  . Smoking status: Never Smoker  . Smokeless tobacco: Never Used  Substance and Sexual Activity  . Alcohol use: Yes    . Drug use: No  . Sexual activity: Not Currently    Birth control/protection: None  Lifestyle  . Physical activity:    Days per week: Not on file    Minutes per session: Not on file  . Stress: Not on file  Relationships  . Social connections:    Talks on phone: Not on file    Gets together: Not on file    Attends religious service: Not on file    Active member of club or organization: Not on file    Attends meetings of clubs or organizations: Not on file    Relationship status: Not on file  . Intimate partner violence:    Fear of current or ex partner: Not on file    Emotionally abused: Not on file    Physically abused: Not on file    Forced sexual activity: Not on file  Other Topics Concern  . Not on file  Social History Narrative   Marital status: widowed since 2014; married x 56 years.  Not dating in 2019.      Children:  2 children; 2 grandchildren; no gg      Lives; with daughter; pt has babysitter five days per week for six hours/day. ssix dogs.      Employed: retired; Administrator; trucking Administrator      Alcohol: none now      Exercise:  None      ADLs: cane for ambulation; no driving since 6237; no cooking; daughter does grocery shopping; daughter does finances.     Advanced Directives:  Not sure if has a living will.  FULL CODE; no prolonged measures.HCPOA: [name]; has a son.     REVIEW OF SYSTEMS: Constitutional: No fevers, chills, or sweats, no generalized fatigue, change in appetite Eyes: No visual changes, double vision, eye pain Ear, nose and throat: No hearing loss, ear pain, nasal congestion, sore throat Cardiovascular: No chest pain, palpitations Respiratory:  No shortness of breath at rest or with exertion, wheezes GastrointestinaI: No nausea, vomiting, diarrhea, abdominal pain, fecal incontinence Genitourinary:  No dysuria, urinary retention or frequency Musculoskeletal:  + neck pain, back pain Integumentary: No rash, pruritus, skin  lesions Neurological: as above Psychiatric: No depression, insomnia, anxiety Endocrine: No palpitations, fatigue, diaphoresis, mood swings, change in appetite, change in weight, increased thirst Hematologic/Lymphatic:  No anemia, purpura, petechiae. Allergic/Immunologic: no itchy/runny eyes, nasal congestion, recent allergic reactions, rashes  PHYSICAL EXAM: Vitals:   04/24/18 1525  BP: 128/60  Pulse: 68  SpO2: 94%   General: No acute distress Head:  Normocephalic/atraumatic Neck: supple, no paraspinal tenderness, full range of motion Heart:  Regular rate and rhythm Lungs:  Clear to auscultation bilaterally Back: No paraspinal tenderness Skin/Extremities: No rash, no edema Neurological Exam: alert and oriented to person, place, and time. No aphasia or dysarthria. Fund of knowledge is appropriate.  Recent and remote memory are impaired.  Attention and concentration are normal.    Able to name objects and repeat phrases. CDT 4/5 MMSE - Mini Mental State Exam 04/24/2018 10/18/2017 07/13/2017  Not completed: - Refused -  Orientation to time 4 - 5  Orientation to Place 4 - 5  Registration 3 - 3  Attention/ Calculation 2 - 4  Recall 1 - 0  Language- name 2 objects 2 - 2  Language- repeat 1 - 1  Language- follow 3 step command 2 - 3  Language- read & follow direction 1 - 1  Write a sentence 1 - 1  Copy design 1 - 1  Total score 22 - 26   Cranial nerves: Pupils equal, round, reactive to light. Extraocular movements intact with no nystagmus. Visual fields full. Facial sensation intact. No facial asymmetry. Tongue, uvula, palate midline.  Motor: Bulk and tone normal, muscle strength 5/5 throughout with no pronator drift.  Sensation to light touch intact.  No extinction to double simultaneous stimulation.  Deep tendon reflexes +1 throughout, toes downgoing.  Finger to nose testing intact.  Gait slow and cautious with cane.  Romberg negative.  IMPRESSION: This is an 84 yo RH man with a  history of hypertension, hyperlipidemia, diabetes, CAD, stroke with no residual deficits, seizures, and dementia. Seizures are predominantly nocturnal, but he has had 2 seizures in wakefulness, last seizure was June 2018, none since addition of low dose Zonisamide 100mg  qhs to Lamotrigine 400mg /day. We may uptitrate if seizures recur. His MMSE today is 22/30 (26/30 in August 2018), his daughter feels he is doing pretty good, continue Aricept 10mg  daily. He does not drive. He will follow-up in 8 months and knows to call for any changes  Thank you for allowing me to participate in his care.  Please do not hesitate to call for any questions or concerns.  The duration of this appointment visit was 30 minutes of face-to-face time with the patient.  Greater than 50% of this time was spent in counseling, explanation of diagnosis, planning of further management, and coordination of care.   Ellouise Newer, M.D.   CC: Dr. Tamala Julian

## 2018-04-24 NOTE — Patient Instructions (Signed)
Great seeing you! Continue all your medications. Follow-up in 8 months, call for any changes.  Seizure Precautions: 1. If medication has been prescribed for you to prevent seizures, take it exactly as directed.  Do not stop taking the medicine without talking to your doctor first, even if you have not had a seizure in a long time.   2. Avoid activities in which a seizure would cause danger to yourself or to others.  Don't operate dangerous machinery, swim alone, or climb in high or dangerous places, such as on ladders, roofs, or girders.  Do not drive unless your doctor says you may.  3. If you have any warning that you may have a seizure, lay down in a safe place where you can't hurt yourself.    4.  No driving for 6 months from last seizure, as per [facility].   Please refer to the following link on the [place] website for more information: [email or web address]   5.  Maintain good sleep hygiene. Avoid alcohol.  6.  Contact your doctor if you have any problems that may be related to the medicine you are taking.  7.  Call 911 and bring the patient back to the ED if:        A.  The seizure lasts longer than 5 minutes.       B.  The patient doesn't awaken shortly after the seizure  C.  The patient has new problems such as difficulty seeing, speaking or moving  D.  The patient was injured during the seizure  E.  The patient has a temperature over 102 F (39C)  F.  The patient vomited and now is having trouble breathing        FALL PRECAUTIONS: Be cautious when walking. Scan the area for obstacles that may increase the risk of trips and falls. When getting up in the mornings, sit up at the edge of the bed for a few minutes before getting out of bed. Consider elevating the bed at the head end to avoid drop of blood pressure when getting up. Walk always in a well-lit room (use night lights in the walls).  Avoid area rugs or power cords from appliances in the middle of the walkways. Use a walker or a cane if necessary and consider physical therapy for balance exercise. Get your eyesight checked regularly.  FINANCIAL OVERSIGHT: Supervision, especially oversight when making financial decisions or transactions is also recommended.  HOME SAFETY: Consider the safety of the kitchen when operating appliances like stoves, microwave oven, and blender. Consider having supervision and share cooking responsibilities until no longer able to participate in those. Accidents with firearms and other hazards in the house should be identified and addressed as well.  DRIVING: Regarding driving, in patients with progressive memory problems, driving will be impaired. We advise to have someone else do the driving if trouble finding directions or if minor accidents are reported. Independent driving assessment is available to determine safety of driving.  ABILITY TO BE LEFT ALONE: If patient is unable to contact 911 operator, consider using LifeLine, or when the need is there, arrange for someone to stay with patients. Smoking is a fire hazard, consider supervision or cessation. Risk of wandering should be assessed by caregiver and if detected at any point, supervision and safe proof recommendations should be instituted.  MEDICATION SUPERVISION: Inability to self-administer medication needs to be constantly addressed. Implement a mechanism to ensure safe administration of the medications.  RECOMMENDATIONS FOR ALL PATIENTS WITH MEMORY PROBLEMS: 1. Continue to exercise (Recommend 30 minutes of walking everyday, or 3 hours every week) 2. Increase social interactions - continue going to [place] and enjoy social gatherings with friends and family 3. Eat healthy, avoid fried foods and eat more fruits and vegetables 4. Maintain adequate blood pressure, blood sugar, and blood cholesterol level. Reducing the risk of stroke and  cardiovascular disease also helps promoting better memory. 5. Avoid stressful situations. Live a simple life and avoid aggravations. Organize your time and prepare for the next day in anticipation. 6. Sleep well, avoid any interruptions of sleep and avoid any distractions in the bedroom that may interfere with adequate sleep quality 7. Avoid sugar, avoid sweets as there is a strong link between excessive sugar intake, diabetes, and cognitive impairment The Mediterranean diet has been shown to help patients reduce the risk of progressive memory disorders and reduces cardiovascular risk. This includes eating fish, eat fruits and green leafy vegetables, nuts like almonds and hazelnuts, walnuts, and also use olive oil. Avoid fast foods and fried foods as much as possible. Avoid sweets and sugar as sugar use has been linked to worsening of memory function.  There is always a concern of gradual progression of memory problems. If this is the case, then we may need to adjust level of care according to patient needs. Support, both to the patient and caregiver, should then be put into place.

## 2018-05-10 ENCOUNTER — Encounter: Payer: Self-pay | Admitting: Family Medicine

## 2018-05-16 ENCOUNTER — Other Ambulatory Visit: Payer: Self-pay | Admitting: Family Medicine

## 2018-05-18 NOTE — Telephone Encounter (Signed)
Humulin R insulin refill-requesting 90 day supply Last OV:01/18/18 Last refill:01/16/18 0 refills HKN:ZUDOD Pharmacy: [id number] - [place], [place] [phone] (Phone) [phone] (Fax)

## 2018-05-22 ENCOUNTER — Encounter: Payer: Self-pay | Admitting: Family Medicine

## 2018-05-22 ENCOUNTER — Ambulatory Visit: Payer: Medicare Other | Admitting: Family Medicine

## 2018-05-22 ENCOUNTER — Other Ambulatory Visit: Payer: Self-pay

## 2018-05-22 VITALS — BP 122/80 | HR 80 | Temp 98.0°F | Resp 16 | Wt 219.0 lb

## 2018-05-22 DIAGNOSIS — N183 Chronic kidney disease, stage 3 unspecified: Secondary | ICD-10-CM

## 2018-05-22 DIAGNOSIS — F0280 Dementia in other diseases classified elsewhere without behavioral disturbance: Secondary | ICD-10-CM

## 2018-05-22 DIAGNOSIS — E7800 Pure hypercholesterolemia, unspecified: Secondary | ICD-10-CM | POA: Diagnosis not present

## 2018-05-22 DIAGNOSIS — Z794 Long term (current) use of insulin: Secondary | ICD-10-CM

## 2018-05-22 DIAGNOSIS — N401 Enlarged prostate with lower urinary tract symptoms: Secondary | ICD-10-CM

## 2018-05-22 DIAGNOSIS — I255 Ischemic cardiomyopathy: Secondary | ICD-10-CM

## 2018-05-22 DIAGNOSIS — Z9049 Acquired absence of other specified parts of digestive tract: Secondary | ICD-10-CM

## 2018-05-22 DIAGNOSIS — G40009 Localization-related (focal) (partial) idiopathic epilepsy and epileptic syndromes with seizures of localized onset, not intractable, without status epilepticus: Secondary | ICD-10-CM

## 2018-05-22 DIAGNOSIS — E1122 Type 2 diabetes mellitus with diabetic chronic kidney disease: Secondary | ICD-10-CM | POA: Diagnosis not present

## 2018-05-22 DIAGNOSIS — R350 Frequency of micturition: Secondary | ICD-10-CM

## 2018-05-22 DIAGNOSIS — G300 Alzheimer's disease with early onset: Secondary | ICD-10-CM

## 2018-05-22 DIAGNOSIS — I10 Essential (primary) hypertension: Secondary | ICD-10-CM

## 2018-05-22 DIAGNOSIS — I2510 Atherosclerotic heart disease of native coronary artery without angina pectoris: Secondary | ICD-10-CM

## 2018-05-22 LAB — GLUCOSE, POCT (MANUAL RESULT ENTRY): POC GLUCOSE: 180 mg/dL — AB (ref 70–99)

## 2018-05-22 LAB — POCT GLYCOSYLATED HEMOGLOBIN (HGB A1C): HEMOGLOBIN A1C: 6.8 % — AB (ref 4.0–5.6)

## 2018-05-22 MED ORDER — PRAVASTATIN SODIUM 20 MG PO TABS: 20.0000 mg | ORAL_TABLET | Freq: Every day | ORAL | 3 refills | Status: DC

## 2018-05-22 MED ORDER — FUROSEMIDE 20 MG PO TABS: 20.0000 mg | ORAL_TABLET | Freq: Every day | ORAL | 1 refills | Status: DC

## 2018-05-22 NOTE — Progress Notes (Signed)
Subjective:    Patient ID: [name], male    DOB: 1932/03/21, 84 y.o.   MRN: [id number]  05/22/2018  Chronic Conditions    HPI This 84 y.o. male presents with daughter for [place] for DMII, hypertension, hypercholesterolemia.  Management changes made at last visit include the following: Hypertension: Well controlled.  Obtain labs for chronic disease management.  No changes in treatment. Hypercholesterolemia: Well controlled.  Obtain labs for chronic disease management.  Continue current medications. Diabetes mellitus type 2 insulin-dependent: Moderately controlled.  Increase Lantus to 12 units at bedtime.  Continue sliding scale short acting insulin.  Obtain labs for chronic disease management. Right knee pain: New onset without trauma.  Patient declined x-ray at today's visit.  Refill of Voltaren gel provided.  If worsens, obtain right knee film. Seizure disorder: Followed by neurology every 6 months.  Does not drive. Mild dementia: Followed by neurology every 6 months.  Excellent family support by daughter.  No changes in therapy. 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  Medically cleared in November 2018 from oncology.  Follow-up only as needed. History of sigmoid colon adenocarcinoma in 2012: Last colonoscopy in 2013.  Continue follow-up CEA recommended by oncology in November 2018.  Due to 5-year mark from initial diagnosis, the minimal risk of recurrence.  No routine follow-up with oncology recommended. Coronary artery disease: Last visit with cardiology in 2017.  Currently asymptomatic.  Obtain EKG for surveillance.  UPDATE FROM LAST VISIT: Developed vomiting; presented to [facility] ED; diagnosed with incarcerated RIGHT inguinal hernia.  Discharged 24 hours later after emergency surgical repair.  Post-op follow-up for drain removal.   Did great.  Sugars running fasting 150, 151.  Post-praindial 240 or less.  Usually 180-210.  Lowest am sugar  120. Humilin SSI twice daily.  Lantus 12 units at night. Patient reports good compliance with medication, good tolerance to medication, and good symptom control.      BP Readings from Last 3 Encounters:  05/22/18 122/80  04/24/18 128/60  01/18/18 130/78   Wt Readings from Last 3 Encounters:  05/22/18 219 lb (99.3 kg)  04/24/18 221 lb (100.2 kg)  01/18/18 221 lb (100.2 kg)   Immunization History  Administered Date(s) Administered  . Influenza Split 10/31/2012  . Influenza,inj,Quad PF,6+ Mos 10/23/2013, 08/14/2014, 08/14/2015, 08/23/2017  . Influenza-Unspecified 10/01/2016  . Pneumococcal Conjugate-13 07/02/2014  . Pneumococcal Polysaccharide-23 12/10/2003, 06/08/2016  . Tdap 05/20/2015    Review of Systems  Constitutional: Negative for activity change, appetite change, chills, diaphoresis, fatigue, fever and unexpected weight change.  HENT: Negative for congestion, dental problem, drooling, ear discharge, ear pain, facial swelling, hearing loss, mouth sores, nosebleeds, postnasal drip, rhinorrhea, sinus pressure, sneezing, sore throat, tinnitus, trouble swallowing and voice change.   Eyes: Positive for photophobia. Negative for pain, discharge, redness, itching and visual disturbance.  Respiratory: Negative for apnea, cough, choking, chest tightness, shortness of breath, wheezing and stridor.   Cardiovascular: Negative for chest pain, palpitations and leg swelling.  Gastrointestinal: Negative for abdominal pain, blood in stool, constipation, diarrhea, nausea and vomiting.  Endocrine: Negative for cold intolerance, heat intolerance, polydipsia, polyphagia and polyuria.  Genitourinary: Negative for decreased urine volume, difficulty urinating, discharge, dysuria, enuresis, flank pain, frequency, genital sores, hematuria, penile pain, penile swelling, scrotal swelling, testicular pain and urgency.  Musculoskeletal: Negative for arthralgias, back pain, gait problem, joint swelling,  myalgias, neck pain and neck stiffness.  Skin: Negative for color change, pallor, rash and wound.  Allergic/Immunologic: Negative for environmental allergies,  food allergies and immunocompromised state.  Neurological: Negative for dizziness, tremors, seizures, syncope, facial asymmetry, speech difficulty, weakness, light-headedness, numbness and headaches.  Hematological: Negative for adenopathy. Does not bruise/bleed easily.  Psychiatric/Behavioral: Negative for agitation, behavioral problems, confusion, decreased concentration, dysphoric mood, hallucinations, self-injury, sleep disturbance and suicidal ideas. The patient is not nervous/anxious and is not hyperactive.     Past Medical History:  Diagnosis Date  . Anemia   . Aortic root dilatation ([place])   . BPH (benign prostatic hyperplasia)    increased PSA  . CAD (coronary artery disease)    Presumed from previously mildy abnormal myoview;  myoview 4/12: EF 73%, no ischemia  . Cardiomyopathy    a. echo 5/12: EF 45-50%, mild LVH, grade 2 diast dysfxn, RAE  . Chronic kidney disease    renal insuff, increased uric acid  . Colon cancer ([place])   . Diabetes mellitus   . DM2 (diabetes mellitus, type 2) ([place])   . HLD (hyperlipidemia)   . HTN (hypertension)   . Leukopenia   . Pneumothorax   . Rectal cancer ([place])   . Seizures ([place])   . Stroke [facility])    Past Surgical History:  Procedure Laterality Date  . CATARACT EXTRACTION, BILATERAL    . COLON SURGERY    . FEMUR IM NAIL Right 11/16/2015   Procedure: INTRAMEDULLARY TROCHANTERIC HIP NAILING;  Surgeon: Rod Can, MD;  Location: [place];  Service: Orthopedics;  Laterality: Right;  . HIP ARTHROPLASTY    . ILEOSTOMY  12/2010  . JOINT REPLACEMENT    . reversal ileostomy  03/2011  . S/P resection of rectal cancer    . TONSILLECTOMY    . VASECTOMY     No Known Allergies Current Outpatient Medications on File Prior to Visit  Medication Sig Dispense Refill  . acetaminophen (TYLENOL) 325  MG tablet Take 650 mg by mouth every 4 (four) hours as needed. Reported on 04/12/2016    . amLODipine (NORVASC) 5 MG tablet Take 1 tablet (5 mg total) by mouth daily. 90 tablet 3  . aspirin 81 MG tablet Take 81 mg by mouth daily.    . benazepril (LOTENSIN) 5 MG tablet Take by mouth.    . diclofenac sodium (VOLTAREN) 1 % GEL APPLY 4 G TOPICALLY 4 (FOUR) TIMES DAILY. 100 g 0  . donepezil (ARICEPT) 10 MG tablet Take 1 tablet (10 mg total) by mouth daily. 90 tablet 3  . doxazosin (CARDURA) 4 MG tablet TAKE 1 TABLET BY MOUTH AT BEDTIME. 90 tablet 3  . ECHINACEA HERB PO Take 1 tablet by mouth daily.    . finasteride (PROSCAR) 5 MG tablet TAKE 1 TABLET (5 MG TOTAL) BY MOUTH DAILY. 90 tablet 3  . glucose blood (ONE TOUCH ULTRA TEST) test strip USE TO TEST BLOOD SUGAR 3 TIMES DAILY. DX E11.22 100 each 1  . HUMULIN R 100 UNIT/ML injection INJECT 0.1-0.15 MLS (10-15 UNITS TOTAL) INTO THE SKIN 2 (TWO) TIMES DAILY BEFORE A MEAL. 10 mL 0  . hydrochlorothiazide (HYDRODIURIL) 25 MG tablet Take by mouth.    . insulin glargine (LANTUS) 100 UNIT/ML injection Inject 0.12 mLs (12 Units total) into the skin at bedtime. 10 mL 11  . INSULIN SYRINGE .5CC/28G (B-D INS SYR MICROFINE .5CC/28G) 28G X 1/2" 0.5 ML MISC Check sugar tid dx: DMII controlled insulin dependent 100 each 11  . lamoTRIgine (LAMICTAL) 100 MG tablet Take 1 tab in AM, 1 tab at noon, 2 tabs at night 360 tablet 3  .  metoprolol succinate (TOPROL-XL) 50 MG 24 hr tablet TAKE 1 TABLET (50 MG TOTAL) BY MOUTH DAILY. TAKE 1 TABLET BY MOUTH EVERY DAY. 90 tablet 3  . nitroGLYCERIN (NITROSTAT) 0.4 MG SL tablet Place 1 tablet (0.4 mg total) under the tongue every 5 (five) minutes as needed for chest pain. 25 tablet 3  . Nutritional Supplements (JUICE PLUS FIBRE PO) Take 2 tablets by mouth 2 (two) times daily. Reported on 04/12/2016    . zonisamide (ZONEGRAN) 100 MG capsule Take 1 capsule every night 90 capsule 3   No current facility-administered medications on file  prior to visit.    Social History   Socioeconomic History  . Marital status: Widowed    Spouse name: Not on file  . Number of children: 2  . Years of education: Not on file  . Highest education level: Not on file  Occupational History  . Occupation: retired  Scientific laboratory technician  . Financial resource strain: Not on file  . Food insecurity:    Worry: Not on file    Inability: Not on file  . Transportation needs:    Medical: Not on file    Non-medical: Not on file  Tobacco Use  . Smoking status: Never Smoker  . Smokeless tobacco: Never Used  Substance and Sexual Activity  . Alcohol use: Yes  . Drug use: No  . Sexual activity: Not Currently    Birth control/protection: None  Lifestyle  . Physical activity:    Days per week: Not on file    Minutes per session: Not on file  . Stress: Not on file  Relationships  . Social connections:    Talks on phone: Not on file    Gets together: Not on file    Attends religious service: Not on file    Active member of club or organization: Not on file    Attends meetings of clubs or organizations: Not on file    Relationship status: Not on file  . Intimate partner violence:    Fear of current or ex partner: Not on file    Emotionally abused: Not on file    Physically abused: Not on file    Forced sexual activity: Not on file  Other Topics Concern  . Not on file  Social History Narrative   Marital status: widowed since 2014; married x 56 years.  Not dating in 2019.      Children:  2 children; 2 grandchildren; no gg      Lives; with daughter; pt has babysitter five days per week for six hours/day. ssix dogs.      Employed: retired; Administrator; trucking Administrator      Alcohol: none now      Exercise:  None      ADLs: cane for ambulation; no driving since 6546; no cooking; daughter does grocery shopping; daughter does finances.     Advanced Directives:  Not sure if has a living will.  FULL CODE; no prolonged  measures.HCPOA: [name]; has a son.    Family History  Problem Relation Age of Onset  . Parkinsonism Father   . Alzheimer's disease Mother   . Alzheimer's disease Sister        Objective:    BP 122/80   Pulse 80   Temp 98 F (36.7 C) (Oral)   Resp 16   Wt 219 lb (99.3 kg)   SpO2 95%   BMI 27.37 kg/m  Physical Exam  Constitutional:  He is oriented to person, place, and time. He appears well-developed and well-nourished. No distress.  HENT:  Head: Normocephalic and atraumatic.  Right Ear: External ear normal.  Left Ear: External ear normal.  Nose: Nose normal.  Mouth/Throat: Oropharynx is clear and moist.  Eyes: Pupils are equal, round, and reactive to light. Conjunctivae and EOM are normal.  Neck: Normal range of motion. Neck supple. Carotid bruit is not present. No thyromegaly present.  Cardiovascular: Normal rate, regular rhythm, normal heart sounds and intact distal pulses. Exam reveals no gallop and no friction rub.  No murmur heard. Pulmonary/Chest: Effort normal and breath sounds normal. He has no wheezes. He has no rales.  Abdominal: Soft. Bowel sounds are normal. He exhibits no distension and no mass. There is no tenderness. There is no rebound and no guarding.  Musculoskeletal:       Right shoulder: Normal.       Left shoulder: Normal.       Cervical back: Normal.  Lymphadenopathy:    He has no cervical adenopathy.  Neurological: He is alert and oriented to person, place, and time. He has normal reflexes. No cranial nerve deficit. He exhibits normal muscle tone. Coordination normal.  Skin: Skin is warm and dry. No rash noted. He is not diaphoretic.  Psychiatric: He has a normal mood and affect. His behavior is normal. Judgment and thought content normal.  Nursing note and vitals reviewed.  No results found. Depression screen [facility] 2/9 05/22/2018 01/18/2018 08/23/2017 01/11/2017 06/08/2016  Decreased Interest 0 0 0 0 0  Down, Depressed, Hopeless 0 0 0 0 0  PHQ - 2 Score  0 0 0 0 0  Some recent data might be hidden   Fall Risk  05/22/2018 04/24/2018 01/18/2018 10/14/2017 08/23/2017  Falls in the past year? No No No No No  Comment - - - - -  Number falls in past yr: - - - - -  Injury with Fall? No - - - -  Comment - - - - -  Risk Factor Category  - - - - -  Risk for fall due to : - - - - -  Risk for fall due to: Comment - - - - -   Results for orders placed or performed in visit on 05/22/18  POCT glycosylated hemoglobin (Hb A1C)  Result Value Ref Range   Hemoglobin A1C 6.8 (A) 4.0 - 5.6 %   HbA1c, POC (prediabetic range)  5.7 - 6.4 %   HbA1c, POC (controlled diabetic range)  0.0 - 7.0 %  POCT glucose (manual entry)  Result Value Ref Range   POC Glucose 180 (A) 70 - 99 mg/dl        Assessment & Plan:   1. Type 2 diabetes mellitus with stage 3 chronic kidney disease, with long-term current use of insulin ([place])   2. Localization-related idiopathic epilepsy and epileptic syndromes with seizures of localized onset, not intractable, without status epilepticus ([place])   3. Stage 3 chronic kidney disease ([place])   4. Pure hypercholesterolemia   5. S/P left colectomy   6. Benign prostatic hyperplasia with urinary frequency   7. Early onset Alzheimer's dementia without behavioral disturbance   8. Atherosclerosis of native coronary artery of native heart without angina pectoris   9. Essential hypertension   10. Ischemic cardiomyopathy     Stable chronic medical conditions; obtain labs for chronic disease management; refills provided.  S/p recent emergency hernia repair for incarcerated hernia.  Orders Placed This  Encounter  Procedures  . CBC with Differential/Platelet  . Comprehensive metabolic panel  . POCT glycosylated hemoglobin (Hb A1C)  . POCT glucose (manual entry)   Meds ordered this encounter  Medications  . furosemide (LASIX) 20 MG tablet    Sig: Take 1 tablet (20 mg total) by mouth daily.    Dispense:  90 tablet    Refill:  1  . pravastatin  (PRAVACHOL) 20 MG tablet    Sig: Take 1 tablet (20 mg total) by mouth daily.    Dispense:  90 tablet    Refill:  3    Return in about 3 months (around 08/22/2018) for follow-up chronic medical conditions [place].   Jamicheal Heard Elayne Guerin, M.D. [facility] [address] [place], [place]  [address] [phone] phone [phone] fax

## 2018-05-22 NOTE — Patient Instructions (Signed)
     IF you received an x-ray today, you will receive an invoice from [facility]. Please contact [facility] at [phone] with questions or concerns regarding your invoice.   IF you received labwork today, you will receive an invoice from [facility]. Please contact [facility] at [phone] with questions or concerns regarding your invoice.   Our billing staff will not be able to assist you with questions regarding bills from these companies.  You will be contacted with the lab results as soon as they are available. The fastest way to get your results is to activate your My Chart account. Instructions are located on the last page of this paperwork. If you have not heard from us regarding the results in 2 weeks, please contact this office.     

## 2018-05-23 ENCOUNTER — Other Ambulatory Visit: Payer: Self-pay | Admitting: Physician Assistant

## 2018-05-23 LAB — CBC WITH DIFFERENTIAL/PLATELET
BASOS: 0 %
Basophils Absolute: 0 10*3/uL (ref 0.0–0.2)
EOS (ABSOLUTE): 0.1 10*3/uL (ref 0.0–0.4)
Eos: 1 %
Hematocrit: 35.1 % — ABNORMAL LOW (ref 37.5–51.0)
Hemoglobin: 11.2 g/dL — ABNORMAL LOW (ref 13.0–17.7)
Immature Grans (Abs): 0 10*3/uL (ref 0.0–0.1)
Immature Granulocytes: 0 %
LYMPHS ABS: 2.6 10*3/uL (ref 0.7–3.1)
Lymphs: 32 %
MCH: 26.8 pg (ref 26.6–33.0)
MCHC: 31.9 g/dL (ref 31.5–35.7)
MCV: 84 fL (ref 79–97)
MONOS ABS: 0.5 10*3/uL (ref 0.1–0.9)
Monocytes: 6 %
NEUTROS ABS: 5 10*3/uL (ref 1.4–7.0)
NEUTROS PCT: 61 %
Platelets: 258 10*3/uL (ref 150–450)
RBC: 4.18 x10E6/uL (ref 4.14–5.80)
RDW: 15 % (ref 12.3–15.4)
WBC: 8.2 10*3/uL (ref 3.4–10.8)

## 2018-05-23 LAB — COMPREHENSIVE METABOLIC PANEL
A/G RATIO: 1.4 (ref 1.2–2.2)
ALT: 9 IU/L (ref 0–44)
AST: 14 IU/L (ref 0–40)
Albumin: 3.9 g/dL (ref 3.5–4.7)
Alkaline Phosphatase: 91 IU/L (ref 39–117)
BUN/Creatinine Ratio: 14 (ref 10–24)
BUN: 20 mg/dL (ref 8–27)
CHLORIDE: 98 mmol/L (ref 96–106)
CO2: 23 mmol/L (ref 20–29)
Calcium: 8.7 mg/dL (ref 8.6–10.2)
Creatinine, Ser: 1.47 mg/dL — ABNORMAL HIGH (ref 0.76–1.27)
GFR calc Af Amer: 50 mL/min/{1.73_m2} — ABNORMAL LOW (ref >59)
GFR calc non Af Amer: 43 mL/min/{1.73_m2} — ABNORMAL LOW (ref >59)
GLOBULIN, TOTAL: 2.7 g/dL (ref 1.5–4.5)
Glucose: 163 mg/dL — ABNORMAL HIGH (ref 65–99)
POTASSIUM: 5.5 mmol/L — AB (ref 3.5–5.2)
SODIUM: 141 mmol/L (ref 134–144)
Total Protein: 6.6 g/dL (ref 6.0–8.5)

## 2018-05-24 ENCOUNTER — Encounter: Payer: Self-pay | Admitting: Podiatry

## 2018-05-24 ENCOUNTER — Ambulatory Visit (INDEPENDENT_AMBULATORY_CARE_PROVIDER_SITE_OTHER): Payer: Medicare Other | Admitting: Podiatry

## 2018-05-24 DIAGNOSIS — M79676 Pain in unspecified toe(s): Secondary | ICD-10-CM | POA: Diagnosis not present

## 2018-05-24 DIAGNOSIS — B351 Tinea unguium: Secondary | ICD-10-CM | POA: Diagnosis not present

## 2018-05-24 DIAGNOSIS — E1142 Type 2 diabetes mellitus with diabetic polyneuropathy: Secondary | ICD-10-CM

## 2018-05-24 NOTE — Telephone Encounter (Signed)
Provider, patient was recently seen 05/22/18 for diabetes. No plan for visit documented, noted he is to return in three months to follow up with Dr. Pamella Pert.  Please refill/adjust dose of requested Humulin R insulin.

## 2018-05-24 NOTE — Progress Notes (Signed)
Complaint:  Visit Type: Patient returns to my office for continued preventative foot care services. Complaint: Patient states" my nails have grown long and thick and become painful to walk and wear shoes" Patient has been diagnosed with DM with neuropathy.. The patient presents for preventative foot care services. No changes to ROS  Podiatric Exam: Vascular: dorsalis pedis and posterior tibial pulses are palpable bilateral. Capillary return is immediate. Temperature gradient is WNL. Skin turgor WNL  Sensorium: Absent  Semmes Weinstein monofilament test. Normal tactile sensation bilaterally. Nail Exam: Pt has thick disfigured discolored nails with subungual debris noted bilateral entire nail hallux through fifth toenails Ulcer Exam: There is no evidence of ulcer or pre-ulcerative changes or infection. Orthopedic Exam: Muscle tone and strength are WNL. No limitations in general ROM. No crepitus or effusions noted. Hammer toes  B/L. Bony prominences are unremarkable. Skin: No Porokeratosis. No infection or ulcers  Diagnosis:  Onychomycosis, , Pain in right toe, pain in left toes,  Diabetes with neuropathy  Treatment & Plan Procedures and Treatment: Consent by patient was obtained for treatment procedures.   Debridement of mycotic and hypertrophic toenails, 1 through 5 bilateral and clearing of subungual debris. No ulceration, no infection noted.  Return Visit-Office Procedure: Patient instructed to return to the office for a follow up visit 3 months for continued evaluation and treatment.    Riordan Walle DPM 

## 2018-06-20 ENCOUNTER — Other Ambulatory Visit: Payer: Self-pay | Admitting: Physician Assistant

## 2018-06-23 ENCOUNTER — Other Ambulatory Visit: Payer: Self-pay | Admitting: Family Medicine

## 2018-06-23 NOTE — Telephone Encounter (Signed)
[facility] called and spoke to Doyline, [facility], asked about the pravastan refill request. I asked did they receive the prescription sent on 05/22/18 90 tab/3 refills, he says they did not receive the prescription.

## 2018-07-12 ENCOUNTER — Other Ambulatory Visit: Payer: Self-pay | Admitting: Neurology

## 2018-07-12 DIAGNOSIS — G40009 Localization-related (focal) (partial) idiopathic epilepsy and epileptic syndromes with seizures of localized onset, not intractable, without status epilepticus: Secondary | ICD-10-CM

## 2018-07-15 ENCOUNTER — Encounter: Payer: Self-pay | Admitting: Family Medicine

## 2018-08-22 ENCOUNTER — Ambulatory Visit: Payer: Medicare Other | Admitting: Family Medicine

## 2018-08-23 ENCOUNTER — Encounter: Payer: Self-pay | Admitting: Podiatry

## 2018-08-23 ENCOUNTER — Ambulatory Visit (INDEPENDENT_AMBULATORY_CARE_PROVIDER_SITE_OTHER): Payer: Medicare Other | Admitting: Podiatry

## 2018-08-23 DIAGNOSIS — E1142 Type 2 diabetes mellitus with diabetic polyneuropathy: Secondary | ICD-10-CM

## 2018-08-23 DIAGNOSIS — M79676 Pain in unspecified toe(s): Secondary | ICD-10-CM

## 2018-08-23 DIAGNOSIS — B351 Tinea unguium: Secondary | ICD-10-CM | POA: Diagnosis not present

## 2018-08-23 NOTE — Progress Notes (Signed)
Complaint:  Visit Type: Patient returns to my office for continued preventative foot care services. Complaint: Patient states" my nails have grown long and thick and become painful to walk and wear shoes" Patient has been diagnosed with DM with neuropathy.. The patient presents for preventative foot care services. No changes to ROS  Podiatric Exam: Vascular: dorsalis pedis and posterior tibial pulses are palpable bilateral. Capillary return is immediate. Temperature gradient is WNL. Skin turgor WNL  Sensorium: Absent  Semmes Weinstein monofilament test. Normal tactile sensation bilaterally. Nail Exam: Pt has thick disfigured discolored nails with subungual debris noted bilateral entire nail hallux through fifth toenails Ulcer Exam: There is no evidence of ulcer or pre-ulcerative changes or infection. Orthopedic Exam: Muscle tone and strength are WNL. No limitations in general ROM. No crepitus or effusions noted. Hammer toes  B/L. Bony prominences are unremarkable. Skin: No Porokeratosis. No infection or ulcers  Diagnosis:  Onychomycosis, , Pain in right toe, pain in left toes,  Diabetes with neuropathy  Treatment & Plan Procedures and Treatment: Consent by patient was obtained for treatment procedures.   Debridement of mycotic and hypertrophic toenails, 1 through 5 bilateral and clearing of subungual debris. No ulceration, no infection noted.  Return Visit-Office Procedure: Patient instructed to return to the office for a follow up visit 3 months for continued evaluation and treatment.    Gardiner Barefoot DPM

## 2018-08-29 ENCOUNTER — Ambulatory Visit (INDEPENDENT_AMBULATORY_CARE_PROVIDER_SITE_OTHER): Payer: Medicare Other | Admitting: Family Medicine

## 2018-08-29 ENCOUNTER — Other Ambulatory Visit: Payer: Self-pay | Admitting: Physician Assistant

## 2018-08-29 ENCOUNTER — Encounter: Payer: Self-pay | Admitting: Family Medicine

## 2018-08-29 ENCOUNTER — Ambulatory Visit (INDEPENDENT_AMBULATORY_CARE_PROVIDER_SITE_OTHER): Payer: Medicare Other

## 2018-08-29 VITALS — BP 148/58 | HR 66 | Temp 97.7°F | Ht 75.0 in | Wt 215.6 lb

## 2018-08-29 DIAGNOSIS — F0280 Dementia in other diseases classified elsewhere without behavioral disturbance: Secondary | ICD-10-CM

## 2018-08-29 DIAGNOSIS — N183 Chronic kidney disease, stage 3 unspecified: Secondary | ICD-10-CM

## 2018-08-29 DIAGNOSIS — E1122 Type 2 diabetes mellitus with diabetic chronic kidney disease: Secondary | ICD-10-CM | POA: Diagnosis not present

## 2018-08-29 DIAGNOSIS — G8929 Other chronic pain: Secondary | ICD-10-CM

## 2018-08-29 DIAGNOSIS — I10 Essential (primary) hypertension: Secondary | ICD-10-CM | POA: Diagnosis not present

## 2018-08-29 DIAGNOSIS — M25561 Pain in right knee: Secondary | ICD-10-CM

## 2018-08-29 DIAGNOSIS — Z794 Long term (current) use of insulin: Secondary | ICD-10-CM | POA: Diagnosis not present

## 2018-08-29 DIAGNOSIS — G300 Alzheimer's disease with early onset: Secondary | ICD-10-CM | POA: Diagnosis not present

## 2018-08-29 LAB — GLUCOSE, POCT (MANUAL RESULT ENTRY): POC Glucose: 162 mg/dl — AB (ref 70–99)

## 2018-08-29 MED ORDER — DICLOFENAC SODIUM 1 % TD GEL: 4.0000 g | Freq: Four times a day (QID) | TRANSDERMAL | 0 refills | Status: DC

## 2018-08-29 NOTE — Progress Notes (Signed)
9/17/20191:59 PM  [name] 06-Nov-1932, 84 y.o. male [id number]  Chief Complaint  Patient presents with  . Diabetes    monitors glucose every morning, numbers are around 120 before meal. Takes Lantus before bed.   . Medication Refill    Voltaren 1%  . Pain    having pain in the right knee. Says he heard it pop    HPI:   Patient is a 84 y.o. male with past medical history significant for DM2 with CKD3, HTN, HLP, h/o colon cancer, seizures and dementia who presents today to establish care  Right knee pain about several weeks ago Felt a pop, swelling Diclofenac gel helps Pain worse with weight bearing on medial aspect No locking, does not give out Denies any injuries or falls Uses cane  Checks cbgs twice a day, before breakfast and dinner Fasting 135-150, before dinner 200-225 No lows  Taking all meds as prescribed Denies any seizures for about a year now, sees neuro in dec Dementia stable, no behavior problems Daughter and her family live with him, here with daughter today Wife died about 5 years ago  onc released in nov 2018, no further surveillance needed  Lab Results  Component Value Date   HGBA1C 6.8 (A) 05/22/2018    Fall Risk  08/29/2018 05/22/2018 04/24/2018 01/18/2018 10/14/2017  Falls in the past year? No No No No No  Comment - - - - -  Number falls in past yr: - - - - -  Injury with Fall? - No - - -  Comment - - - - -  Risk Factor Category  - - - - -  Risk for fall due to : - - - - -  Risk for fall due to: Comment - - - - -     Depression screen [facility] 2/9 08/29/2018 05/22/2018 01/18/2018  Decreased Interest 0 0 0  Down, Depressed, Hopeless 0 0 0  PHQ - 2 Score 0 0 0  Some recent data might be hidden    No Known Allergies  Prior to Admission medications   Medication Sig Start Date End Date Taking? Authorizing Provider  acetaminophen (TYLENOL) 325 MG tablet Take 650 mg by mouth every 4 (four) hours as needed. Reported on 04/12/2016   Yes [provider]  amLODipine (NORVASC) 5 MG tablet Take 1 tablet (5 mg total) by mouth daily. 12/26/17  Yes Wardell Honour, MD  aspirin 81 MG tablet Take 81 mg by mouth daily.   Yes [provider]  benazepril (LOTENSIN) 5 MG tablet Take by mouth. 10/20/10  Yes [provider]  diclofenac sodium (VOLTAREN) 1 % GEL APPLY 4 G TOPICALLY 4 (FOUR) TIMES DAILY. 03/23/18  Yes Wardell Honour, MD  donepezil (ARICEPT) 10 MG tablet Take 1 tablet (10 mg total) by mouth daily. 04/24/18  Yes Cameron Sprang, MD  doxazosin (CARDURA) 4 MG tablet TAKE 1 TABLET BY MOUTH AT BEDTIME. 01/11/18  Yes Wardell Honour, MD  ECHINACEA HERB PO Take 1 tablet by mouth daily.   Yes [provider]  finasteride (PROSCAR) 5 MG tablet TAKE 1 TABLET (5 MG TOTAL) BY MOUTH DAILY. 03/29/18  Yes Wardell Honour, MD  furosemide (LASIX) 20 MG tablet Take 1 tablet (20 mg total) by mouth daily. 05/22/18  Yes Wardell Honour, MD  glucose blood (ONE TOUCH ULTRA TEST) test strip USE TO TEST BLOOD SUGAR 3 TIMES DAILY. DX E11.22 06/23/18  Yes Wardell Honour, MD  HUMULIN R 100 UNIT/ML injection INJECT 0.1-0.15 MLS (10-15 UNITS TOTAL) INTO THE SKIN 2 (TWO) TIMES DAILY BEFORE A MEAL. 05/25/18  Yes Wardell Honour, MD  hydrochlorothiazide (HYDRODIURIL) 25 MG tablet Take by mouth. 10/20/10  Yes [provider]  insulin glargine (LANTUS) 100 UNIT/ML injection Inject 0.12 mLs (12 Units total) into the skin at bedtime. 01/18/18  Yes Wardell Honour, MD  INSULIN SYRINGE .5CC/28G (B-D INS SYR MICROFINE .5CC/28G) 28G X 1/2" 0.5 ML MISC Check sugar tid dx: DMII controlled insulin dependent 08/15/17  Yes Wardell Honour, MD  lamoTRIgine (LAMICTAL) 100 MG tablet Take 1 tab in AM, 1 tab at noon, 2 tabs at night 04/24/18  Yes Cameron Sprang, MD  metoprolol succinate (TOPROL-XL) 50 MG 24 hr tablet TAKE 1 TABLET (50 MG TOTAL) BY MOUTH DAILY. TAKE 1 TABLET BY MOUTH EVERY DAY. 01/11/18  Yes Wardell Honour, MD  nitroGLYCERIN (NITROSTAT) 0.4 MG  SL tablet Place 1 tablet (0.4 mg total) under the tongue every 5 (five) minutes as needed for chest pain. 01/11/17  Yes Wardell Honour, MD  Nutritional Supplements (JUICE PLUS FIBRE PO) Take 2 tablets by mouth 2 (two) times daily. Reported on 04/12/2016   Yes [provider]  pravastatin (PRAVACHOL) 20 MG tablet TAKE 1 TABLET BY MOUTH EVERY DAY 06/23/18  Yes Wardell Honour, MD  zonisamide [facility]) 100 MG capsule Take 1 capsule every night 04/24/18  Yes Cameron Sprang, MD  lamoTRIgine (LAMICTAL) 100 MG tablet TAKE 1 TAB IN AM, 1 TAB AT NOON, 2 TABS AT NIGHT Patient not taking: Take 1 tab in AM, 1 tab at noon, 2 tabs at night 07/12/18   Cameron Sprang, MD    Past Medical History:  Diagnosis Date  . Anemia   . Aortic root dilatation ([place])   . BPH (benign prostatic hyperplasia)    increased PSA  . CAD (coronary artery disease)    Presumed from previously mildy abnormal myoview;  myoview 4/12: EF 73%, no ischemia  . Cardiomyopathy    a. echo 5/12: EF 45-50%, mild LVH, grade 2 diast dysfxn, RAE  . Chronic kidney disease    renal insuff, increased uric acid  . Colon cancer ([place])   . Diabetes mellitus   . DM2 (diabetes mellitus, type 2) ([place])   . HLD (hyperlipidemia)   . HTN (hypertension)   . Leukopenia   . Pneumothorax   . Rectal cancer ([place])   . Seizures ([place])   . Stroke [facility])     Past Surgical History:  Procedure Laterality Date  . CATARACT EXTRACTION, BILATERAL    . COLON SURGERY    . FEMUR IM NAIL Right 11/16/2015   Procedure: INTRAMEDULLARY TROCHANTERIC HIP NAILING;  Surgeon: Rod Can, MD;  Location: [facility];  Service: Orthopedics;  Laterality: Right;  . HIP ARTHROPLASTY    . ILEOSTOMY  12/2010  . JOINT REPLACEMENT    . reversal ileostomy  03/2011  . S/P resection of rectal cancer    . TONSILLECTOMY    . VASECTOMY      Social History   Tobacco Use  . Smoking status: Never Smoker  . Smokeless tobacco: Never Used  Substance Use Topics  . Alcohol use: Yes     Family History  Problem Relation Age of Onset  . Parkinsonism Father   . Alzheimer's disease Mother   . Alzheimer's disease Sister     Review of Systems  Constitutional: Negative for chills and fever.  Respiratory:  Negative for cough and shortness of breath.   Cardiovascular: Negative for chest pain, palpitations and leg swelling.  Gastrointestinal: Negative for abdominal pain, nausea and vomiting.  Musculoskeletal: Positive for joint pain. Negative for falls.     OBJECTIVE:  Blood pressure (!) 148/58, pulse 66, temperature 97.7 F (36.5 C), temperature source Oral, height 6\' 3"  (1.905 m), weight 215 lb 9.6 oz (97.8 kg), SpO2 94 %. Body mass index is 26.95 kg/m.   Physical Exam  Constitutional: He appears well-developed and well-nourished.  HENT:  Head: Normocephalic and atraumatic.  Mouth/Throat: Oropharynx is clear and moist.  Eyes: Pupils are equal, round, and reactive to light. Conjunctivae and EOM are normal.  Neck: Neck supple.  Cardiovascular: Normal rate and regular rhythm. Exam reveals no gallop and no friction rub.  No murmur heard. Pulmonary/Chest: Effort normal and breath sounds normal. He has no wheezes. He has no rales.  Musculoskeletal: He exhibits no edema.       Right knee: He exhibits normal range of motion, no swelling, no effusion, no erythema, no LCL laxity, normal patellar mobility, normal meniscus (neg mcmcurrys) and no MCL laxity. Tenderness found. Medial joint line tenderness noted.       Left knee: Normal.  Neurological: He is alert. Gait abnormal.  Oriented to self and place  Skin: Skin is warm and dry.  Psychiatric: He has a normal mood and affect.  Nursing note and vitals reviewed.   No results found for this or any previous visit (from the past 24 hour(s)).  Dg Knee Complete 4 Views Right  Result Date: 08/29/2018 CLINICAL DATA:  Chronic right knee pain, no injury EXAM: RIGHT KNEE - COMPLETE 4+ VIEW COMPARISON:  None. FINDINGS: There  is very mild tricompartmental degenerative joint disease of the right knee. There is slight loss of medial compartment joint space with minimal spurring from the lateral and patellofemoral compartments. No fracture seen and no joint effusion is noted. No soft tissue abnormality is seen. IMPRESSION: Very mild tricompartmental degenerative joint disease of the right knee for age. No acute abnormality. No effusion. Electronically Signed   By: Ivar Drape M.D.   On: 08/29/2018 14:39     ASSESSMENT and PLAN  1. Type 2 diabetes mellitus with stage 3 chronic kidney disease, with long-term current use of insulin (HCC) Controlled. Continue current regime.  - CBC - Comprehensive metabolic panel - POCT glucose (manual entry)  2. Essential hypertension Controlled. Continue current regime.   3. Early onset Alzheimer's dementia without behavioral disturbance Controlled. Continue current regime.   4. Chronic pain of right knee Mild DJD. Discussed supportive measures. Cont with diclofenac gel prn - DG Knee Complete 4 Views Right; Future  Other orders - diclofenac sodium (VOLTAREN) 1 % GEL; Apply 4 g topically 4 (four) times daily.  Return in about 3 months (around 11/28/2018).   Return for high dose flu vaccine after sept 26th [place], MD [facility] at [employer] [place], [place] [address] Ph.  [phone] Fax [phone]

## 2018-08-29 NOTE — Patient Instructions (Signed)
° ° ° °  If you have lab work done today you will be contacted with your lab results within the next 2 weeks.  If you have not heard from us then please contact us. The fastest way to get your results is to register for My Chart. ° ° °IF you received an x-ray today, you will receive an invoice from [facility]. Please contact [facility] at [phone] with questions or concerns regarding your invoice.  ° °IF you received labwork today, you will receive an invoice from [facility]. Please contact [facility] at [phone] with questions or concerns regarding your invoice.  ° °Our billing staff will not be able to assist you with questions regarding bills from these companies. ° °You will be contacted with the lab results as soon as they are available. The fastest way to get your results is to activate your My Chart account. Instructions are located on the last page of this paperwork. If you have not heard from us regarding the results in 2 weeks, please contact this office. °  ° ° ° °

## 2018-08-30 ENCOUNTER — Other Ambulatory Visit: Payer: Self-pay | Admitting: Neurology

## 2018-08-30 DIAGNOSIS — F03A0 Unspecified dementia, mild, without behavioral disturbance, psychotic disturbance, mood disturbance, and anxiety: Secondary | ICD-10-CM

## 2018-08-30 DIAGNOSIS — F0390 Unspecified dementia without behavioral disturbance: Secondary | ICD-10-CM

## 2018-08-30 LAB — COMPREHENSIVE METABOLIC PANEL
ALT: 8 IU/L (ref 0–44)
AST: 12 IU/L (ref 0–40)
Albumin/Globulin Ratio: 1.8 (ref 1.2–2.2)
Albumin: 4.2 g/dL (ref 3.5–4.7)
Alkaline Phosphatase: 93 IU/L (ref 39–117)
BUN/Creatinine Ratio: 16 (ref 10–24)
BUN: 23 mg/dL (ref 8–27)
Bilirubin Total: <0.2 mg/dL (ref 0.0–1.2)
CO2: 24 mmol/L (ref 20–29)
Calcium: 9 mg/dL (ref 8.6–10.2)
Chloride: 102 mmol/L (ref 96–106)
Creatinine, Ser: 1.43 mg/dL — ABNORMAL HIGH (ref 0.76–1.27)
GFR calc Af Amer: 52 mL/min/{1.73_m2} — ABNORMAL LOW (ref >59)
GFR calc non Af Amer: 45 mL/min/{1.73_m2} — ABNORMAL LOW (ref >59)
Globulin, Total: 2.3 g/dL (ref 1.5–4.5)
Glucose: 139 mg/dL — ABNORMAL HIGH (ref 65–99)
Potassium: 5 mmol/L (ref 3.5–5.2)
Sodium: 141 mmol/L (ref 134–144)
Total Protein: 6.5 g/dL (ref 6.0–8.5)

## 2018-08-30 LAB — CBC
Hematocrit: 36.9 % — ABNORMAL LOW (ref 37.5–51.0)
Hemoglobin: 11.8 g/dL — ABNORMAL LOW (ref 13.0–17.7)
MCH: 26.8 pg (ref 26.6–33.0)
MCHC: 32 g/dL (ref 31.5–35.7)
MCV: 84 fL (ref 79–97)
Platelets: 233 10*3/uL (ref 150–450)
RBC: 4.4 x10E6/uL (ref 4.14–5.80)
RDW: 15 % (ref 12.3–15.4)
WBC: 7.3 10*3/uL (ref 3.4–10.8)

## 2018-08-31 ENCOUNTER — Encounter: Payer: Self-pay | Admitting: *Deleted

## 2018-09-14 ENCOUNTER — Other Ambulatory Visit: Payer: Self-pay | Admitting: Neurology

## 2018-09-14 DIAGNOSIS — F03A0 Unspecified dementia, mild, without behavioral disturbance, psychotic disturbance, mood disturbance, and anxiety: Secondary | ICD-10-CM

## 2018-09-14 DIAGNOSIS — F0390 Unspecified dementia without behavioral disturbance: Secondary | ICD-10-CM

## 2018-09-15 ENCOUNTER — Other Ambulatory Visit: Payer: Self-pay | Admitting: Neurology

## 2018-09-15 DIAGNOSIS — F0390 Unspecified dementia without behavioral disturbance: Secondary | ICD-10-CM

## 2018-09-15 DIAGNOSIS — F03A0 Unspecified dementia, mild, without behavioral disturbance, psychotic disturbance, mood disturbance, and anxiety: Secondary | ICD-10-CM

## 2018-10-16 ENCOUNTER — Other Ambulatory Visit: Payer: Self-pay | Admitting: Family Medicine

## 2018-10-16 NOTE — Telephone Encounter (Signed)
Copied from [place] [id number]. Topic: Quick Communication - Rx Refill/Question >> Oct 16, 2018 12:09 PM Scherrie Gerlach wrote: Medication: INSULIN SYRINGE .5CC/28G (B-D INS SYR MICROFINE .5CC/28G) 28G X 1/2" 0.5 ML MISC 100 each Pt had called the pharmacy last week and now has only 2 days left.  Daughter states the pharmacy said they sent several times, but I do not see anything.  [id number] - [place], [place] [place] [phone] (Phone) [phone] (Fax)

## 2018-10-17 MED ORDER — "INSULIN SYRINGE 28G X 1/2"" 0.5 ML MISC": 11 refills | Status: DC

## 2018-10-17 NOTE — Telephone Encounter (Signed)
Requested Prescriptions  Pending Prescriptions Disp Refills  . Insulin Syringe-Needle U-100 (INSULIN SYRINGE .5CC/28G) 28G X 1/2" 0.5 ML MISC 100 each 11    Sig: Check sugar tid dx: DMII controlled insulin dependent     There is no refill protocol information for this order

## 2018-10-23 ENCOUNTER — Telehealth: Payer: Self-pay | Admitting: Neurology

## 2018-10-23 NOTE — Telephone Encounter (Signed)
Patients daughter made aware

## 2018-10-23 NOTE — Telephone Encounter (Signed)
Patient daughter left VM stating that they can no longer get the Lamictal in 100mg  you can only get it in 25mg . [facility] [place]. He is out of medication and will have a seizure if we don't call in the medication please call patient daughter

## 2018-10-23 NOTE — Telephone Encounter (Signed)
Patient's daughter is calling in again. Please call her back at [phone]. He is out of his seizure medication. Thanks!

## 2018-10-23 NOTE — Telephone Encounter (Signed)
He should be okay switching brands and continue 100mg  tablets - 1 in the morning, 1 at noon, and 2 at bedtime.

## 2018-10-23 NOTE — Telephone Encounter (Signed)
Spoke with patient's daughter.  She states they were told a long time ago not to change brands of his seizure medication by Dr. Melton Alar. I had called the pharmacy and confirmed they still have 100 mg tablets, but they do not make it in the same brand he has been taking. I did advise it would be better to switch brands than to switch to 25 mg tablets, as insurance is not going to approve him to take that many pills (he takes 400 mg).  Patient's daughter wants to know the validity of staying on the same brand and if it is safe to change. Please advise.

## 2018-10-25 LAB — HM DIABETES EYE EXAM

## 2018-11-16 ENCOUNTER — Other Ambulatory Visit: Payer: Self-pay | Admitting: Physician Assistant

## 2018-11-16 DIAGNOSIS — L570 Actinic keratosis: Secondary | ICD-10-CM | POA: Diagnosis not present

## 2018-11-16 DIAGNOSIS — C44629 Squamous cell carcinoma of skin of left upper limb, including shoulder: Secondary | ICD-10-CM | POA: Diagnosis not present

## 2018-11-22 ENCOUNTER — Encounter: Payer: Self-pay | Admitting: Podiatry

## 2018-11-22 ENCOUNTER — Ambulatory Visit: Payer: Medicare Other | Admitting: Podiatry

## 2018-11-22 DIAGNOSIS — M216X2 Other acquired deformities of left foot: Secondary | ICD-10-CM | POA: Diagnosis not present

## 2018-11-22 DIAGNOSIS — E1142 Type 2 diabetes mellitus with diabetic polyneuropathy: Secondary | ICD-10-CM | POA: Diagnosis not present

## 2018-11-22 DIAGNOSIS — M2042 Other hammer toe(s) (acquired), left foot: Secondary | ICD-10-CM

## 2018-11-22 DIAGNOSIS — M79676 Pain in unspecified toe(s): Secondary | ICD-10-CM

## 2018-11-22 DIAGNOSIS — B351 Tinea unguium: Secondary | ICD-10-CM

## 2018-11-22 DIAGNOSIS — M2041 Other hammer toe(s) (acquired), right foot: Secondary | ICD-10-CM

## 2018-11-22 DIAGNOSIS — L84 Corns and callosities: Secondary | ICD-10-CM | POA: Diagnosis not present

## 2018-11-22 NOTE — Progress Notes (Signed)
Complaint:  Visit Type: Patient returns to my office for continued preventative foot care services. Complaint: Patient states" my nails have grown long and thick and become painful to walk and wear shoes" Patient has been diagnosed with DM with neuropathy.. The patient presents for preventative foot care services. No changes to ROS  Podiatric Exam: Vascular: dorsalis pedis and posterior tibial pulses are palpable bilateral. Capillary return is immediate. Temperature gradient is WNL. Skin turgor WNL  Sensorium: Absent  Semmes Weinstein monofilament test. Normal tactile sensation bilaterally. Nail Exam: Pt has thick disfigured discolored nails with subungual debris noted bilateral entire nail hallux through fifth toenails Ulcer Exam: There is no evidence of ulcer or pre-ulcerative changes or infection. Orthopedic Exam: Muscle tone and strength are WNL. No limitations in general ROM. No crepitus or effusions noted. Hammer toes  B/L. Plantar flexed fifth metatarsal left foot. Skin: No Porokeratosis. No infection or ulcers.  preulcerous callus left foot.  Diagnosis:  Onychomycosis, , Pain in right toe, pain in left toes,  Diabetes with neuropathy  Treatment & Plan Procedures and Treatment: Consent by patient was obtained for treatment procedures.   Debridement of mycotic and hypertrophic toenails, 1 through 5 bilateral and clearing of subungual debris. No ulceration, no infection noted. Patient qualifies for diabetic shoes due to DPN hammer toe and preulcerous callus due to plantar flexed metatarsal. Return Visit-Office Procedure: Patient instructed to return to the office for a follow up visit 3 months for continued evaluation and treatment.    Gardiner Barefoot DPM

## 2018-11-24 ENCOUNTER — Other Ambulatory Visit: Payer: Self-pay | Admitting: Neurology

## 2018-11-24 DIAGNOSIS — G40009 Localization-related (focal) (partial) idiopathic epilepsy and epileptic syndromes with seizures of localized onset, not intractable, without status epilepticus: Secondary | ICD-10-CM

## 2018-11-27 ENCOUNTER — Other Ambulatory Visit: Payer: Self-pay

## 2018-11-27 MED ORDER — INSULIN REGULAR HUMAN 100 UNIT/ML IJ SOLN: INTRAMUSCULAR | 1 refills | Status: DC

## 2018-11-28 ENCOUNTER — Other Ambulatory Visit: Payer: Self-pay

## 2018-11-28 ENCOUNTER — Ambulatory Visit (INDEPENDENT_AMBULATORY_CARE_PROVIDER_SITE_OTHER): Payer: Medicare Other | Admitting: Family Medicine

## 2018-11-28 ENCOUNTER — Encounter: Payer: Self-pay | Admitting: Family Medicine

## 2018-11-28 VITALS — BP 126/68 | HR 68 | Temp 98.2°F | Ht 75.0 in | Wt 216.2 lb

## 2018-11-28 DIAGNOSIS — K4090 Unilateral inguinal hernia, without obstruction or gangrene, not specified as recurrent: Secondary | ICD-10-CM

## 2018-11-28 DIAGNOSIS — Z794 Long term (current) use of insulin: Secondary | ICD-10-CM

## 2018-11-28 DIAGNOSIS — E1122 Type 2 diabetes mellitus with diabetic chronic kidney disease: Secondary | ICD-10-CM | POA: Diagnosis not present

## 2018-11-28 DIAGNOSIS — N183 Chronic kidney disease, stage 3 (moderate): Secondary | ICD-10-CM | POA: Diagnosis not present

## 2018-11-28 DIAGNOSIS — G40009 Localization-related (focal) (partial) idiopathic epilepsy and epileptic syndromes with seizures of localized onset, not intractable, without status epilepticus: Secondary | ICD-10-CM | POA: Diagnosis not present

## 2018-11-28 DIAGNOSIS — F0390 Unspecified dementia without behavioral disturbance: Secondary | ICD-10-CM

## 2018-11-28 DIAGNOSIS — I10 Essential (primary) hypertension: Secondary | ICD-10-CM

## 2018-11-28 DIAGNOSIS — F03A0 Unspecified dementia, mild, without behavioral disturbance, psychotic disturbance, mood disturbance, and anxiety: Secondary | ICD-10-CM

## 2018-11-28 MED ORDER — DONEPEZIL HCL 10 MG PO TABS: 10.0000 mg | ORAL_TABLET | Freq: Every day | ORAL | 3 refills | Status: DC

## 2018-11-28 MED ORDER — FINASTERIDE 5 MG PO TABS: 5.0000 mg | ORAL_TABLET | Freq: Every day | ORAL | 3 refills | Status: AC

## 2018-11-28 MED ORDER — INSULIN REGULAR HUMAN 100 UNIT/ML IJ SOLN: INTRAMUSCULAR | 1 refills | Status: AC

## 2018-11-28 MED ORDER — DOXAZOSIN MESYLATE 4 MG PO TABS: 4.0000 mg | ORAL_TABLET | Freq: Every day | ORAL | 3 refills | Status: AC

## 2018-11-28 MED ORDER — ALLOPURINOL 100 MG PO TABS: 100.0000 mg | ORAL_TABLET | Freq: Every day | ORAL | 3 refills | Status: AC

## 2018-11-28 MED ORDER — FUROSEMIDE 20 MG PO TABS: 20.0000 mg | ORAL_TABLET | Freq: Every day | ORAL | 1 refills | Status: AC

## 2018-11-28 MED ORDER — LAMOTRIGINE 100 MG PO TABS: ORAL_TABLET | ORAL | 3 refills | Status: DC

## 2018-11-28 MED ORDER — METOPROLOL SUCCINATE ER 50 MG PO TB24: 50.0000 mg | ORAL_TABLET | Freq: Every day | ORAL | 3 refills | Status: AC

## 2018-11-28 MED ORDER — PRAVASTATIN SODIUM 20 MG PO TABS: 20.0000 mg | ORAL_TABLET | Freq: Every day | ORAL | 3 refills | Status: AC

## 2018-11-28 MED ORDER — INSULIN GLARGINE 100 UNIT/ML ~~LOC~~ SOLN: 12.0000 [IU] | Freq: Every day | SUBCUTANEOUS | 11 refills | Status: AC

## 2018-11-28 MED ORDER — BENAZEPRIL HCL 5 MG PO TABS: 5.0000 mg | ORAL_TABLET | Freq: Every day | ORAL | 3 refills | Status: AC

## 2018-11-28 MED ORDER — DICLOFENAC SODIUM 1 % TD GEL: 4.0000 g | Freq: Four times a day (QID) | TRANSDERMAL | 0 refills | Status: AC

## 2018-11-28 MED ORDER — AMLODIPINE BESYLATE 5 MG PO TABS: 5.0000 mg | ORAL_TABLET | Freq: Every day | ORAL | 3 refills | Status: AC

## 2018-11-28 MED ORDER — "INSULIN SYRINGE 28G X 1/2"" 0.5 ML MISC": 11 refills | Status: AC

## 2018-11-28 MED ORDER — GLUCOSE BLOOD VI STRP: ORAL_STRIP | 11 refills | Status: AC

## 2018-11-28 NOTE — Progress Notes (Signed)
12/17/201912:30 PM  [name] 1932-04-09, 84 y.o. male [id number]  Chief Complaint  Patient presents with  . Diabetes    follow up  . Hypertension    follow up  . Medication Refill    needs 3 Month supplies all all meds. All meds have been verified. Per daughter no colonoscopy needed unless problem arises.    HPI:   Patient is a 84 y.o. male with past medical history significant for HTN, DM2, CAD, dementia. CKD3, BPH, HLP, h/o colon cancer who presents today for routine followup  Checks cbgs every morning before dinner For past couple of weeks fasting in the 160s Before dinner yesterday 180, usually less than 200 Has been eating dinner later than usual  lantus 12 units at bedtime humalog twice a day on sliding scale, avg 8-10 units Denies any lows  Slowing down, less active, not going outside due to weather  Does not check BP at home  Has not had any issues with his gout of recent Takes allopurinol  Diclofenac gel helping with knee pain Uses cane  Seeing podiatrist recently Had a callus cut off bottom of left foot, near pinky Wants me to take a look  Concerns for recurrent right sided inguinal hernia, Dr Arvin Collard Not painful, normal BM, no color changes   Stable on lamitcal, no seziures  Lab Results  Component Value Date   HGBA1C 6.8 (A) 05/22/2018   HGBA1C 7.5 01/18/2018   HGBA1C 7.4 (H) 08/23/2017   Lab Results  Component Value Date   MICROALBUR 3.9 12/25/2015   [place] [facility] 01/18/2018   CREATININE 1.43 (H) 08/29/2018    Fall Risk  11/28/2018 08/29/2018 05/22/2018 04/24/2018 01/18/2018  Falls in the past year? 0 No No No No  Comment - - - - -  Number falls in past yr: - - - - -  Injury with Fall? - - No - -  Comment - - - - -  Risk Factor Category  - - - - -  Risk for fall due to : - - - - -  Risk for fall due to: Comment - - - - -     Depression screen [facility] 2/9 11/28/2018 08/29/2018 05/22/2018  Decreased Interest 0 0 0  Down, Depressed, Hopeless 0  0 0  PHQ - 2 Score 0 0 0  Some recent data might be hidden    No Known Allergies  Prior to Admission medications   Medication Sig Start Date End Date Taking? Authorizing Provider  acetaminophen (TYLENOL) 325 MG tablet Take 650 mg by mouth every 4 (four) hours as needed. Reported on 04/12/2016   Yes [provider]  allopurinol (ZYLOPRIM) 100 MG tablet  03/14/11  Yes [provider]  amLODipine (NORVASC) 5 MG tablet Take 1 tablet (5 mg total) by mouth daily. 12/26/17  Yes Wardell Honour, MD  aspirin 81 MG tablet Take 81 mg by mouth daily.   Yes [provider]  benazepril (LOTENSIN) 5 MG tablet Take by mouth. 10/20/10  Yes [provider]  diclofenac sodium (VOLTAREN) 1 % GEL Apply 4 g topically 4 (four) times daily. 08/29/18  Yes Rutherford Guys, MD  donepezil (ARICEPT) 10 MG tablet Take 1 tablet (10 mg total) by mouth daily. 04/24/18  Yes [place] Sprang, MD  donepezil (ARICEPT) 10 MG tablet TAKE 1 TABLET BY MOUTH EVERY DAY 09/15/18  Yes [place] Sprang, MD  doxazosin (CARDURA) 4 MG tablet TAKE 1 TABLET BY MOUTH AT  BEDTIME. 01/11/18  Yes Wardell Honour, MD  ECHINACEA HERB PO Take 1 tablet by mouth daily.   Yes [provider]  filgrastim (NEUPOGEN) 480 MCG/1.6ML injection  10/30/10  Yes [provider]  finasteride (PROSCAR) 5 MG tablet TAKE 1 TABLET (5 MG TOTAL) BY MOUTH DAILY. 03/29/18  Yes Wardell Honour, MD  furosemide (LASIX) 20 MG tablet Take 1 tablet (20 mg total) by mouth daily. 05/22/18  Yes Wardell Honour, MD  glucose blood (ONE TOUCH ULTRA TEST) test strip USE TO TEST BLOOD SUGAR 3 TIMES DAILY. DX E11.22 06/23/18  Yes Wardell Honour, MD  hydrochlorothiazide (HYDRODIURIL) 25 MG tablet Take by mouth. 10/20/10  Yes [provider]  insulin glargine (LANTUS) 100 UNIT/ML injection Inject 0.12 mLs (12 Units total) into the skin at bedtime. 01/18/18  Yes Wardell Honour, MD  insulin regular (HUMULIN R) 100 units/mL injection INJECT  0.1-0.15 MLS (10-15 UNITS TOTAL) INTO THE SKIN 2 (TWO) TIMES DAILY BEFORE A MEAL. 11/27/18  Yes Rutherford Guys, MD  Insulin Syringe-Needle U-100 (INSULIN SYRINGE .5CC/28G) 28G X 1/2" 0.5 ML MISC Check sugar tid dx: DMII controlled insulin dependent 10/17/18  Yes [place] Guys, MD  lamoTRIgine (LAMICTAL) 100 MG tablet Take 1 tab in AM, 1 tab at noon, 2 tabs at night 04/24/18  Yes [place] Sprang, MD  metoprolol succinate (TOPROL-XL) 50 MG 24 hr tablet TAKE 1 TABLET (50 MG TOTAL) BY MOUTH DAILY. TAKE 1 TABLET BY MOUTH EVERY DAY. 01/11/18  Yes Wardell Honour, MD  nitrofurantoin, macrocrystal-monohydrate, (MACROBID) 100 MG capsule  01/26/11  Yes [provider]  Nutritional Supplements (JUICE PLUS FIBRE PO) Take 2 tablets by mouth 2 (two) times daily. Reported on 04/12/2016   Yes [provider]  pravastatin (PRAVACHOL) 20 MG tablet TAKE 1 TABLET BY MOUTH EVERY DAY 06/23/18  Yes Wardell Honour, MD  zonisamide (ZONEGRAN) 100 MG capsule TAKE ONE CAPSULE BY MOUTH AT BEDTIME 11/24/18  Yes [place] Sprang, MD    Past Medical History:  Diagnosis Date  . Anemia   . Aortic root dilatation ([place])   . BPH (benign prostatic hyperplasia)    increased PSA  . CAD (coronary artery disease)    Presumed from previously mildy abnormal myoview;  myoview 4/12: EF 73%, no ischemia  . Cardiomyopathy    a. echo 5/12: EF 45-50%, mild LVH, grade 2 diast dysfxn, RAE  . Chronic kidney disease    renal insuff, increased uric acid  . Colon cancer ([place])   . Diabetes mellitus   . DM2 (diabetes mellitus, type 2) ([place])   . HLD (hyperlipidemia)   . HTN (hypertension)   . Leukopenia   . Pneumothorax   . Rectal cancer ([place])   . Seizures ([place])   . Stroke [facility])     Past Surgical History:  Procedure Laterality Date  . CATARACT EXTRACTION, BILATERAL    . COLON SURGERY    . FEMUR IM NAIL Right 11/16/2015   Procedure: INTRAMEDULLARY TROCHANTERIC HIP NAILING;  Surgeon: Rod Can, MD;  Location: [place];   Service: Orthopedics;  Laterality: Right;  . HIP ARTHROPLASTY    . ILEOSTOMY  12/2010  . JOINT REPLACEMENT    . reversal ileostomy  03/2011  . S/P resection of rectal cancer    . TONSILLECTOMY    . VASECTOMY      Social History   Tobacco Use  . Smoking status: Never Smoker  . Smokeless tobacco: Never Used  Substance  Use Topics  . Alcohol use: Yes    Family History  Problem Relation Age of Onset  . Parkinsonism Father   . Alzheimer's disease Mother   . Alzheimer's disease Sister     Review of Systems  Constitutional: Negative for chills and fever.  Respiratory: Negative for cough and shortness of breath.   Cardiovascular: Negative for chest pain, palpitations and leg swelling.  Gastrointestinal: Negative for abdominal pain, nausea and vomiting.  per hpi   OBJECTIVE:  Blood pressure 126/68, pulse 68, temperature 98.2 F (36.8 C), temperature source Oral, height 6' 3"  (1.905 m), weight 216 lb 3.2 oz (98.1 kg), SpO2 94 %. Body mass index is 27.02 kg/m.   Wt Readings from Last 3 Encounters:  11/28/18 216 lb 3.2 oz (98.1 kg)  08/29/18 215 lb 9.6 oz (97.8 kg)  05/22/18 219 lb (99.3 kg)    Physical Exam Vitals signs and nursing note reviewed.  Constitutional:      Appearance: He is well-developed.  HENT:     Head: Normocephalic and atraumatic.  Eyes:     Conjunctiva/sclera: Conjunctivae normal.     Pupils: Pupils are equal, round, and reactive to light.  Neck:     Musculoskeletal: Neck supple.  Cardiovascular:     Rate and Rhythm: Normal rate and regular rhythm.     Heart sounds: No murmur. No friction rub. No gallop.   Pulmonary:     Effort: Pulmonary effort is normal.     Breath sounds: Normal breath sounds. No wheezing or rales.  Abdominal:     Hernia: A hernia is present. Hernia is present in the right inguinal area.  Musculoskeletal:       Feet:  Skin:    General: Skin is warm and dry.  Neurological:     Mental Status: He is alert and oriented to  person, place, and time.     ASSESSMENT and PLAN  1. Essential hypertension Controlled. Continue current regime.  - CMP14+EGFR - Lipid panel  2. Type 2 diabetes mellitus with stage 3 chronic kidney disease, with long-term current use of insulin ([place]) Checking labs today, medications will be adjusted as needed. Consider increasing lantus if a1c > 8.0 - CMP14+EGFR - Lipid panel - Hemoglobin A1c - insulin glargine (LANTUS) 100 UNIT/ML injection; Inject 0.12 mLs (12 Units total) into the skin at bedtime.  3. Mild dementia ([place]) Stable. Continue current regime - donepezil (ARICEPT) 10 MG tablet; Take 1 tablet (10 mg total) by mouth daily.  4. Localization-related idiopathic epilepsy and epileptic syndromes with seizures of localized onset, not intractable, without status epilepticus ([place]) Stable. Cont current regime - lamoTRIgine (LAMICTAL) 100 MG tablet; Take 1 tab in AM, 1 tab at noon, 2 tabs at night  5. Right inguinal hernia Recurrence. Had repair in April 2019. No concerns on exam. Discussed RTC precautions - Ambulatory referral to General Surgery, Dr Arvin Collard  Other orders - allopurinol (ZYLOPRIM) 100 MG tablet; Take 1 tablet (100 mg total) by mouth daily. - amLODipine (NORVASC) 5 MG tablet; Take 1 tablet (5 mg total) by mouth daily. - benazepril (LOTENSIN) 5 MG tablet; Take 1 tablet (5 mg total) by mouth daily. - diclofenac sodium (VOLTAREN) 1 % GEL; Apply 4 g topically 4 (four) times daily. - doxazosin (CARDURA) 4 MG tablet; Take 1 tablet (4 mg total) by mouth at bedtime. - finasteride (PROSCAR) 5 MG tablet; Take 1 tablet (5 mg total) by mouth daily. - furosemide (LASIX) 20 MG tablet; Take 1 tablet (20 mg  total) by mouth daily. - insulin regular (HUMULIN R) 100 units/mL injection; INJECT 0.1-0.15 MLS (10-15 UNITS TOTAL) INTO THE SKIN 2 (TWO) TIMES DAILY BEFORE A MEAL. - Insulin Syringe-Needle U-100 (INSULIN SYRINGE .5CC/28G) 28G X 1/2" 0.5 ML MISC; Check sugar tid dx: DMII  controlled insulin dependent - glucose blood (ONE TOUCH ULTRA TEST) test strip; USE TO TEST BLOOD SUGAR 3 TIMES DAILY. DX E11.22 - metoprolol succinate (TOPROL-XL) 50 MG 24 hr tablet; Take 1 tablet (50 mg total) by mouth daily. TAKE 1 TABLET BY MOUTH EVERY DAY. - pravastatin (PRAVACHOL) 20 MG tablet; Take 1 tablet (20 mg total) by mouth daily.  Return in about 3 months (around 02/27/2019).    Rutherford Guys, MD [facility] at [facility] [place], [place] [address] Ph.  [phone] Fax [phone]

## 2018-11-29 LAB — LIPID PANEL
Chol/HDL Ratio: 3.2 ratio (ref 0.0–5.0)
Cholesterol, Total: 117 mg/dL (ref 100–199)
HDL: 37 mg/dL — ABNORMAL LOW (ref >39)
LDL Calculated: 57 mg/dL (ref 0–99)
Triglycerides: 113 mg/dL (ref 0–149)
VLDL Cholesterol Cal: 23 mg/dL (ref 5–40)

## 2018-11-29 LAB — CMP14+EGFR
ALT: 13 IU/L (ref 0–44)
AST: 15 IU/L (ref 0–40)
Albumin/Globulin Ratio: 1.3 (ref 1.2–2.2)
Albumin: 3.8 g/dL (ref 3.5–4.7)
Alkaline Phosphatase: 87 IU/L (ref 39–117)
BUN/Creatinine Ratio: 16 (ref 10–24)
BUN: 23 mg/dL (ref 8–27)
Bilirubin Total: <0.2 mg/dL (ref 0.0–1.2)
CO2: 24 mmol/L (ref 20–29)
Calcium: 8.7 mg/dL (ref 8.6–10.2)
Chloride: 103 mmol/L (ref 96–106)
Creatinine, Ser: 1.42 mg/dL — ABNORMAL HIGH (ref 0.76–1.27)
GFR calc Af Amer: 52 mL/min/{1.73_m2} — ABNORMAL LOW (ref >59)
GFR calc non Af Amer: 45 mL/min/{1.73_m2} — ABNORMAL LOW (ref >59)
Globulin, Total: 2.9 g/dL (ref 1.5–4.5)
Glucose: 179 mg/dL — ABNORMAL HIGH (ref 65–99)
Potassium: 5.1 mmol/L (ref 3.5–5.2)
Sodium: 139 mmol/L (ref 134–144)
Total Protein: 6.7 g/dL (ref 6.0–8.5)

## 2018-11-29 LAB — HEMOGLOBIN A1C
Est. average glucose Bld gHb Est-mCnc: 154 mg/dL
Hgb A1c MFr Bld: 7 % — ABNORMAL HIGH (ref 4.8–5.6)

## 2018-12-14 ENCOUNTER — Encounter: Payer: Self-pay | Admitting: Radiology

## 2018-12-21 ENCOUNTER — Telehealth: Payer: Self-pay

## 2018-12-21 NOTE — Telephone Encounter (Signed)
Faxed info back to [facility] with note from 12/19/2018 visit.

## 2018-12-25 ENCOUNTER — Encounter: Payer: Self-pay | Admitting: Neurology

## 2018-12-25 ENCOUNTER — Ambulatory Visit (INDEPENDENT_AMBULATORY_CARE_PROVIDER_SITE_OTHER): Payer: Medicare Other | Admitting: Neurology

## 2018-12-25 ENCOUNTER — Other Ambulatory Visit: Payer: Self-pay

## 2018-12-25 VITALS — BP 136/72 | HR 67 | Ht 75.0 in | Wt 222.0 lb

## 2018-12-25 DIAGNOSIS — F03A0 Unspecified dementia, mild, without behavioral disturbance, psychotic disturbance, mood disturbance, and anxiety: Secondary | ICD-10-CM

## 2018-12-25 DIAGNOSIS — F0390 Unspecified dementia without behavioral disturbance: Secondary | ICD-10-CM

## 2018-12-25 DIAGNOSIS — G40009 Localization-related (focal) (partial) idiopathic epilepsy and epileptic syndromes with seizures of localized onset, not intractable, without status epilepticus: Secondary | ICD-10-CM

## 2018-12-25 MED ORDER — LAMOTRIGINE 100 MG PO TABS: ORAL_TABLET | ORAL | 3 refills | Status: AC

## 2018-12-25 MED ORDER — DONEPEZIL HCL 10 MG PO TABS: 10.0000 mg | ORAL_TABLET | Freq: Every day | ORAL | 3 refills | Status: AC

## 2018-12-25 MED ORDER — ZONISAMIDE 100 MG PO CAPS: ORAL_CAPSULE | ORAL | 3 refills | Status: AC

## 2018-12-25 NOTE — Patient Instructions (Signed)
Great seeing you! Continue all your medications. Follow-up in 8 months, call for any changes  Seizure Precautions: 1. If medication has been prescribed for you to prevent seizures, take it exactly as directed.  Do not stop taking the medicine without talking to your doctor first, even if you have not had a seizure in a long time.   2. Avoid activities in which a seizure would cause danger to yourself or to others.  Don't operate dangerous machinery, swim alone, or climb in high or dangerous places, such as on ladders, roofs, or girders.  Do not drive unless your doctor says you may.  3. If you have any warning that you may have a seizure, lay down in a safe place where you can't hurt yourself.    4.  No driving for 6 months from last seizure, as per [place] state law.   Please refer to the following link on the [employer]'s website for more information: http://www.epilepsyfoundation.org/answerplace/Social/driving/drivingu.cfm   5.  Maintain good sleep hygiene. Avoid alcohol.  6.  Contact your doctor if you have any problems that may be related to the medicine you are taking.  7.  Call 911 and bring the patient back to the ED if:        A.  The seizure lasts longer than 5 minutes.       B.  The patient doesn't awaken shortly after the seizure  C.  The patient has new problems such as difficulty seeing, speaking or moving  D.  The patient was injured during the seizure  E.  The patient has a temperature over 102 F (39C)  F.  The patient vomited and now is having trouble breathing         

## 2018-12-25 NOTE — Progress Notes (Signed)
NEUROLOGY FOLLOW UP OFFICE NOTE  [name] [id number] Dec 31, 1931  HISTORY OF PRESENT ILLNESS: I had the pleasure of seeing [name] in follow-up in the neurology clinic on 12/25/2018.  The patient was last seen 8 months ago for seizures and dementia. He is again accompanied by his daughter who helps supplement the history today. His daughter states that "everything is better, his attitude is better, memory is much better." She denies any seizures since addition of low dose Zonisamide 100mg  qhs last June 2018 to Lamotrigine 400mg /day. No side effects on medications. He is on Donepezil 10mg  daily without side effects. He does not drive. Daughter manages medications and finances. He is independent with bathing, his daughter puts out his clothes and he puts them on. They deny any staring/unresponsive episodes. He denies any headaches, dizziness, diplopia, focal numbness/tingling/weakness, no falls.  Sleep is good, no wandering behavior. No paranoia or hallucinations.  HPI 07/13/2017: This is an 85 yo RH man with a history of hypertension, hyperlipidemia, diabetes, CAD, stroke with no residual deficits, seizures, and dementia. He had been seeing neurologist Dr. Catalina [name], who has retired. Records from Dr. Catalina [name] were reviewed. [name] started having recurrent nocturnal episodes in 2010. His wife had described hearing a loud scream, extremities would be rigid and extended, with jerking lasting 3-4 minutes. He would be confused or sleepy after. He can have clusters of up to 4 at night. He was evaluated at [facility] with unremarkable MRI brain and 24-hour EEG which did not show any epileptiform activity, typical events were not captured. He was started on Topiramate in 2012. He had a repeat MRI brain with and without contrast with Dr. Catalina [name] in November 2011 which did not show any acute changes. There was a small chronic lacunar infarct in the left lateral thalamus, and chronic microvascular disease. He was  switched to [place] in 2013, which caused sedation and dizziness. He was then switched to Lamotrigine in 2013 with dose uptitration over the years, currently on 100, 100, 200mg  since January 2018. His daughter keeps a calendar of his seizures. He had 4 seizures in 2014, 3 in 2015, 4 in 2016, 1 in 2017. So far this year, he has had 2, one on January 8, and most recently on 05/31/17. The last one was "more minor," but occurred during wakefulness. He came down that morning confused, unable to remember how to use his glucometer, speech as gibberish even if he thought he was making sense. There was no focal weakness noted by daughter. He then sat down and started having a seizure, with heavy breathing, stiff with extremities extended for a second, then snoring like he was drugged soon after. He wet his pants and bit his tongue. By noon, he was speaking normally. The last daytime seizure was around 3 years ago. Otherwise all his seizures have been nocturnal between 4-7am. He lives with his daughter, who notices that a few days before a seizure, he would seem to be craving food more, grazing around, when usually he is a picky eater. She reports that 3-5 days before a seizure, "he gets more mean." Seizure triggers that she has noticed are beer, sweets, and after trauma (he had a seizure after he broke his shoulder, then after he fractured his hip). He denies any olfactory/gustatory hallucinations, deja vu, rising epigastric sensation, focal numbness/tingling/weakness, myoclonic jerks.  He has also been having cognitive dysfunction since at least 2013. He is taking Aricept 10mg  daily without side effects. Overall his daughter feels  his memory has been stable. His daughter has always been in charge of bills and medications, stating "he doesn't do anything for himself." He does not drive. His mother and sister were diagnosed with Alzheimer's disease. 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 Otherwise he  had a normal birth and early development.  There is no history of febrile convulsions, CNS infections such as meningitis/encephalitis, significant traumatic brain injury, neurosurgical procedures, or family history of seizures.  Prior AEDs: Keppra, Topamax EEGs: EEG done 05/2012 was abnormal due to mild diffuse slowing MRI brain as above  PAST MEDICAL HISTORY: Past Medical History:  Diagnosis Date  . Anemia   . Aortic root dilatation ([place])   . BPH (benign prostatic hyperplasia)    increased PSA  . CAD (coronary artery disease)    Presumed from previously mildy abnormal myoview;  myoview 4/12: EF 73%, no ischemia  . Cardiomyopathy    a. echo 5/12: EF 45-50%, mild LVH, grade 2 diast dysfxn, RAE  . Chronic kidney disease    renal insuff, increased uric acid  . Colon cancer ([place])   . Diabetes mellitus   . DM2 (diabetes mellitus, type 2) ([place])   . HLD (hyperlipidemia)   . HTN (hypertension)   . Leukopenia   . Pneumothorax   . Rectal cancer ([employer])   . Seizures ([place])   . Stroke [facility])     MEDICATIONS: Current Outpatient Medications on File Prior to Visit  Medication Sig Dispense Refill  . acetaminophen (TYLENOL) 325 MG tablet Take 650 mg by mouth every 4 (four) hours as needed. Reported on 04/12/2016    . allopurinol (ZYLOPRIM) 100 MG tablet Take 1 tablet (100 mg total) by mouth daily. 90 tablet 3  . amLODipine (NORVASC) 5 MG tablet Take 1 tablet (5 mg total) by mouth daily. 90 tablet 3  . aspirin 81 MG tablet Take 81 mg by mouth daily.    . benazepril (LOTENSIN) 5 MG tablet Take 1 tablet (5 mg total) by mouth daily. 90 tablet 3  . diclofenac sodium (VOLTAREN) 1 % GEL Apply 4 g topically 4 (four) times daily. 100 g 0  . donepezil (ARICEPT) 10 MG tablet Take 1 tablet (10 mg total) by mouth daily. 90 tablet 3  . doxazosin (CARDURA) 4 MG tablet Take 1 tablet (4 mg total) by mouth at bedtime. 90 tablet 3  . ECHINACEA HERB PO Take 1 tablet by mouth daily.    . filgrastim (NEUPOGEN) 480  MCG/1.6ML injection     . finasteride (PROSCAR) 5 MG tablet Take 1 tablet (5 mg total) by mouth daily. 90 tablet 3  . furosemide (LASIX) 20 MG tablet Take 1 tablet (20 mg total) by mouth daily. 90 tablet 1  . glucose blood (ONE TOUCH ULTRA TEST) test strip USE TO TEST BLOOD SUGAR 3 TIMES DAILY. DX E11.22 100 each 11  . insulin glargine (LANTUS) 100 UNIT/ML injection Inject 0.12 mLs (12 Units total) into the skin at bedtime. 10 mL 11  . insulin regular (HUMULIN R) 100 units/mL injection INJECT 0.1-0.15 MLS (10-15 UNITS TOTAL) INTO THE SKIN 2 (TWO) TIMES DAILY BEFORE A MEAL. 30 mL 1  . Insulin Syringe-Needle U-100 (INSULIN SYRINGE .5CC/28G) 28G X 1/2" 0.5 ML MISC Check sugar tid dx: DMII controlled insulin dependent 100 each 11  . lamoTRIgine (LAMICTAL) 100 MG tablet Take 1 tab in AM, 1 tab at noon, 2 tabs at night 360 tablet 3  . metoprolol succinate (TOPROL-XL) 50 MG 24 hr  tablet Take 1 tablet (50 mg total) by mouth daily. TAKE 1 TABLET BY MOUTH EVERY DAY. 90 tablet 3  . Nutritional Supplements (JUICE PLUS FIBRE PO) Take 2 tablets by mouth 2 (two) times daily. Reported on 04/12/2016    . pravastatin (PRAVACHOL) 20 MG tablet Take 1 tablet (20 mg total) by mouth daily. 90 tablet 3  . zonisamide (ZONEGRAN) 100 MG capsule TAKE ONE CAPSULE BY MOUTH AT BEDTIME 90 capsule 3   No current facility-administered medications on file prior to visit.     ALLERGIES: No Known Allergies  FAMILY HISTORY: Family History  Problem Relation Age of Onset  . Parkinsonism Father   . Alzheimer's disease Mother   . Alzheimer's disease Sister     SOCIAL HISTORY: Social History   Socioeconomic History  . Marital status: Widowed    Spouse name: Not on file  . Number of children: 2  . Years of education: Not on file  . Highest education level: Not on file  Occupational History  . Occupation: retired  Scientific laboratory technician  . Financial resource strain: Not on file  . Food insecurity:    Worry: Not on file     Inability: Not on file  . Transportation needs:    Medical: Not on file    Non-medical: Not on file  Tobacco Use  . Smoking status: Never Smoker  . Smokeless tobacco: Never Used  Substance and Sexual Activity  . Alcohol use: Yes  . Drug use: No  . Sexual activity: Not Currently    Birth control/protection: None  Lifestyle  . Physical activity:    Days per week: Not on file    Minutes per session: Not on file  . Stress: Not on file  Relationships  . Social connections:    Talks on phone: Not on file    Gets together: Not on file    Attends religious service: Not on file    Active member of club or organization: Not on file    Attends meetings of clubs or organizations: Not on file    Relationship status: Not on file  . Intimate partner violence:    Fear of current or ex partner: Not on file    Emotionally abused: Not on file    Physically abused: Not on file    Forced sexual activity: Not on file  Other Topics Concern  . Not on file  Social History Narrative   Marital status: widowed since 2014; married x 56 years.  Not dating in 2019.      Children:  2 children; 2 grandchildren; no gg      Lives; with daughter; pt has babysitter five days per week for six hours/day. ssix dogs.      Employed: retired; Administrator; trucking Administrator      Alcohol: none now      Exercise:  None      ADLs: cane for ambulation; no driving since 7106; no cooking; daughter does grocery shopping; daughter does finances.     Advanced Directives:  Not sure if has a living will.  FULL CODE; no prolonged measures.HCPOA: [name]; has a son.     REVIEW OF SYSTEMS: Constitutional: No fevers, chills, or sweats, no generalized fatigue, change in appetite Eyes: No visual changes, double vision, eye pain Ear, nose and throat: No hearing loss, ear pain, nasal congestion, sore throat Cardiovascular: No chest pain, palpitations Respiratory:  No shortness of breath at rest or with  exertion,  wheezes GastrointestinaI: No nausea, vomiting, diarrhea, abdominal pain, fecal incontinence Genitourinary:  No dysuria, urinary retention or frequency Musculoskeletal:  + neck pain, back pain Integumentary: No rash, pruritus, skin lesions Neurological: as above Psychiatric: No depression, insomnia, anxiety Endocrine: No palpitations, fatigue, diaphoresis, mood swings, change in appetite, change in weight, increased thirst Hematologic/Lymphatic:  No anemia, purpura, petechiae. Allergic/Immunologic: no itchy/runny eyes, nasal congestion, recent allergic reactions, rashes  PHYSICAL EXAM: Vitals:   12/25/18 1544  BP: 136/72  Pulse: 67  SpO2: 96%   General: No acute distress Head:  Normocephalic/atraumatic Neck: supple, no paraspinal tenderness, full range of motion Heart:  Regular rate and rhythm Lungs:  Clear to auscultation bilaterally Back: No paraspinal tenderness Skin/Extremities: No rash, no edema Neurological Exam: alert and oriented to person, place, and time. No aphasia or dysarthria. Fund of knowledge is appropriate.  Recent and remote memory are impaired. 0/3 delayed recall. Attention and concentration are reduced, 3/5 WORLD backward. Able to name objects and repeat phrases. Cranial nerves: Pupils equal, round, reactive to light. Extraocular movements intact with no nystagmus. Visual fields full. Facial sensation intact. No facial asymmetry. Tongue, uvula, palate midline.  Motor: Bulk and tone normal, muscle strength 5/5 throughout with no pronator drift.  Sensation to light touch intact.  No extinction to double simultaneous stimulation.  Deep tendon reflexes +1 throughout, toes downgoing.  Finger to nose testing intact.  Gait slow and cautious with cane (similar to prior), no ataxia.  Romberg negative.  IMPRESSION: This is an 85 yo RH man with a history of hypertension, hyperlipidemia, diabetes, CAD, stroke with no residual deficits, seizures, and dementia. Seizures are  predominantly nocturnal, but he has had 2 seizures in wakefulness, last seizure was June 2018, none since addition of low dose Zonisamide 100mg  qhs to Lamotrigine 400mg /day. He continues to do well, refills sent. Daughter feels memory is better (MMSE in May 2019 was 22/30), continue Donepezil 10mg  daily. He does not drive. He will follow-up in 8 months and knows to call for any changes  Thank you for allowing me to participate in his care.  Please do not hesitate to call for any questions or concerns.  The duration of this appointment visit was 20 minutes of face-to-face time with the patient.  Greater than 50% of this time was spent in counseling, explanation of diagnosis, planning of further management, and coordination of care.   Ellouise Newer, M.D.   CC: Dr. Pamella Pert

## 2018-12-26 DIAGNOSIS — K4091 Unilateral inguinal hernia, without obstruction or gangrene, recurrent: Secondary | ICD-10-CM | POA: Diagnosis not present

## 2018-12-28 ENCOUNTER — Ambulatory Visit (INDEPENDENT_AMBULATORY_CARE_PROVIDER_SITE_OTHER): Payer: Medicare Other | Admitting: Orthotics

## 2018-12-28 DIAGNOSIS — B351 Tinea unguium: Secondary | ICD-10-CM | POA: Diagnosis not present

## 2018-12-28 DIAGNOSIS — M2041 Other hammer toe(s) (acquired), right foot: Secondary | ICD-10-CM

## 2018-12-28 DIAGNOSIS — M79676 Pain in unspecified toe(s): Secondary | ICD-10-CM | POA: Diagnosis not present

## 2018-12-28 DIAGNOSIS — L84 Corns and callosities: Secondary | ICD-10-CM

## 2018-12-28 DIAGNOSIS — E1121 Type 2 diabetes mellitus with diabetic nephropathy: Secondary | ICD-10-CM

## 2018-12-28 DIAGNOSIS — M2042 Other hammer toe(s) (acquired), left foot: Secondary | ICD-10-CM | POA: Diagnosis not present

## 2018-12-28 NOTE — Progress Notes (Signed)

## 2019-01-05 ENCOUNTER — Other Ambulatory Visit: Payer: Self-pay | Admitting: Physician Assistant

## 2019-01-05 DIAGNOSIS — C44722 Squamous cell carcinoma of skin of right lower limb, including hip: Secondary | ICD-10-CM | POA: Diagnosis not present

## 2019-01-08 DIAGNOSIS — K4091 Unilateral inguinal hernia, without obstruction or gangrene, recurrent: Secondary | ICD-10-CM | POA: Diagnosis not present

## 2019-01-08 DIAGNOSIS — Z8673 Personal history of transient ischemic attack (TIA), and cerebral infarction without residual deficits: Secondary | ICD-10-CM | POA: Diagnosis not present

## 2019-01-08 DIAGNOSIS — K4031 Unilateral inguinal hernia, with obstruction, without gangrene, recurrent: Secondary | ICD-10-CM | POA: Diagnosis not present

## 2019-01-08 DIAGNOSIS — I129 Hypertensive chronic kidney disease with stage 1 through stage 4 chronic kidney disease, or unspecified chronic kidney disease: Secondary | ICD-10-CM | POA: Diagnosis not present

## 2019-01-08 DIAGNOSIS — E1122 Type 2 diabetes mellitus with diabetic chronic kidney disease: Secondary | ICD-10-CM | POA: Diagnosis not present

## 2019-01-08 DIAGNOSIS — N183 Chronic kidney disease, stage 3 (moderate): Secondary | ICD-10-CM | POA: Diagnosis not present

## 2019-01-08 DIAGNOSIS — K4090 Unilateral inguinal hernia, without obstruction or gangrene, not specified as recurrent: Secondary | ICD-10-CM | POA: Diagnosis not present

## 2019-01-08 DIAGNOSIS — Z85038 Personal history of other malignant neoplasm of large intestine: Secondary | ICD-10-CM | POA: Diagnosis not present

## 2019-01-08 DIAGNOSIS — Z79899 Other long term (current) drug therapy: Secondary | ICD-10-CM | POA: Diagnosis not present

## 2019-01-08 DIAGNOSIS — R569 Unspecified convulsions: Secondary | ICD-10-CM | POA: Diagnosis not present

## 2019-01-08 DIAGNOSIS — Z794 Long term (current) use of insulin: Secondary | ICD-10-CM | POA: Diagnosis not present

## 2019-01-08 DIAGNOSIS — K4041 Unilateral inguinal hernia, with gangrene, recurrent: Secondary | ICD-10-CM | POA: Diagnosis not present

## 2019-01-08 DIAGNOSIS — Z7982 Long term (current) use of aspirin: Secondary | ICD-10-CM | POA: Diagnosis not present

## 2019-01-10 ENCOUNTER — Telehealth: Payer: Self-pay | Admitting: Family Medicine

## 2019-01-11 DIAGNOSIS — I499 Cardiac arrhythmia, unspecified: Secondary | ICD-10-CM | POA: Diagnosis not present

## 2019-01-11 DIAGNOSIS — I469 Cardiac arrest, cause unspecified: Secondary | ICD-10-CM | POA: Diagnosis not present

## 2019-01-11 DIAGNOSIS — R404 Transient alteration of awareness: Secondary | ICD-10-CM | POA: Diagnosis not present

## 2019-01-11 NOTE — Telephone Encounter (Signed)
Spoke with [name], daughter Overall she is doing ok She reports that he was recovering very well from surgery Found intact in his bed, undisturbed the next morning with a fist clenched

## 2019-01-13 NOTE — Telephone Encounter (Signed)
Spoke with [name], paramedic. called to  [name]'s house - family member found him in bed decaeased around 8:45am  Last seen at around 8:45pm last night by dtr.  Found by EMS to be cold, in rigor.  Pt lying in bed, no sign of trauma. No bleeding. No empty bottles or other concerning findings.   Spoke with [name] his daughter, she lives with him.  Had R inguinal hernia surgery 2 days ago, sent home same day.  [name] talked to daughter last night.  Asked if he would feel better next day. Only took 2 pain pills yesterday. Hx of epilepsy, but no known recent seizure - dogs will notice at times and he usually vocalizes when having seizure. No seizure in over a year. Hx of TIA. Hx of diabetes, colon cancer.   Condolences given to daughter.  Autopsy was not requested.  Advised that I will inform his primary care provider and we would be completing paperwork, but to reach out to [place] if there are any immediate needs.

## 2019-01-13 DEATH — deceased

## 2019-02-21 ENCOUNTER — Ambulatory Visit: Payer: Medicare Other | Admitting: Podiatry

## 2019-02-28 ENCOUNTER — Ambulatory Visit: Payer: Medicare Other | Admitting: Family Medicine

## 2019-08-21 ENCOUNTER — Ambulatory Visit: Payer: Medicare Other | Admitting: Neurology

## 2020-03-13 NOTE — Telephone Encounter (Signed)
No action needed
